# Patient Record
Sex: Male | Born: 1980 | Race: White | Hispanic: No | Smoking: Current every day smoker
Health system: Southern US, Community
[De-identification: ages and names within clinical notes are randomized; demographics above are authoritative.]

## PROBLEM LIST (undated history)

## (undated) DIAGNOSIS — F329 Major depressive disorder, single episode, unspecified: Secondary | ICD-10-CM

## (undated) DIAGNOSIS — F259 Schizoaffective disorder, unspecified: Secondary | ICD-10-CM

## (undated) DIAGNOSIS — F4310 Post-traumatic stress disorder, unspecified: Secondary | ICD-10-CM

## (undated) DIAGNOSIS — F32A Depression, unspecified: Secondary | ICD-10-CM

## (undated) DIAGNOSIS — F649 Gender identity disorder, unspecified: Secondary | ICD-10-CM

## (undated) DIAGNOSIS — K769 Liver disease, unspecified: Secondary | ICD-10-CM

---

## 2007-04-20 ENCOUNTER — Emergency Department: Payer: Self-pay | Admitting: Emergency Medicine

## 2009-11-17 ENCOUNTER — Emergency Department: Payer: Self-pay | Admitting: Emergency Medicine

## 2011-02-20 ENCOUNTER — Emergency Department: Payer: Self-pay | Admitting: Emergency Medicine

## 2011-02-27 ENCOUNTER — Emergency Department: Payer: Self-pay | Admitting: Emergency Medicine

## 2011-04-29 ENCOUNTER — Emergency Department: Payer: Self-pay | Admitting: Emergency Medicine

## 2013-03-12 ENCOUNTER — Emergency Department (HOSPITAL_COMMUNITY)
Admission: EM | Admit: 2013-03-12 | Discharge: 2013-03-12 | Disposition: A | Discharge status: Home / Self Care | Source: Home / Self Care

## 2013-10-01 ENCOUNTER — Encounter (HOSPITAL_COMMUNITY): Payer: Self-pay | Admitting: Emergency Medicine

## 2013-10-01 ENCOUNTER — Emergency Department (HOSPITAL_COMMUNITY)
Admission: EM | Admit: 2013-10-01 | Discharge: 2013-10-02 | Disposition: A | Discharge status: Other Acute Inpatient Hospital | Payer: BC Managed Care – PPO | Attending: Emergency Medicine | Admitting: Emergency Medicine

## 2013-10-01 DIAGNOSIS — F1410 Cocaine abuse, uncomplicated: Secondary | ICD-10-CM | POA: Insufficient documentation

## 2013-10-01 DIAGNOSIS — F1310 Sedative, hypnotic or anxiolytic abuse, uncomplicated: Secondary | ICD-10-CM | POA: Insufficient documentation

## 2013-10-01 DIAGNOSIS — F1910 Other psychoactive substance abuse, uncomplicated: Secondary | ICD-10-CM

## 2013-10-01 DIAGNOSIS — F1210 Cannabis abuse, uncomplicated: Secondary | ICD-10-CM | POA: Insufficient documentation

## 2013-10-01 DIAGNOSIS — IMO0002 Reserved for concepts with insufficient information to code with codable children: Secondary | ICD-10-CM | POA: Insufficient documentation

## 2013-10-01 DIAGNOSIS — Z7982 Long term (current) use of aspirin: Secondary | ICD-10-CM | POA: Insufficient documentation

## 2013-10-01 DIAGNOSIS — F3289 Other specified depressive episodes: Secondary | ICD-10-CM | POA: Insufficient documentation

## 2013-10-01 DIAGNOSIS — F17200 Nicotine dependence, unspecified, uncomplicated: Secondary | ICD-10-CM | POA: Insufficient documentation

## 2013-10-01 DIAGNOSIS — Z880 Allergy status to penicillin: Secondary | ICD-10-CM | POA: Insufficient documentation

## 2013-10-01 DIAGNOSIS — F329 Major depressive disorder, single episode, unspecified: Secondary | ICD-10-CM | POA: Insufficient documentation

## 2013-10-01 DIAGNOSIS — Z79899 Other long term (current) drug therapy: Secondary | ICD-10-CM | POA: Insufficient documentation

## 2013-10-01 DIAGNOSIS — Z792 Long term (current) use of antibiotics: Secondary | ICD-10-CM | POA: Insufficient documentation

## 2013-10-01 DIAGNOSIS — F4310 Post-traumatic stress disorder, unspecified: Secondary | ICD-10-CM | POA: Insufficient documentation

## 2013-10-01 DIAGNOSIS — Z8659 Personal history of other mental and behavioral disorders: Secondary | ICD-10-CM | POA: Insufficient documentation

## 2013-10-01 DIAGNOSIS — Z8719 Personal history of other diseases of the digestive system: Secondary | ICD-10-CM | POA: Insufficient documentation

## 2013-10-01 HISTORY — DX: Post-traumatic stress disorder, unspecified: F43.10

## 2013-10-01 HISTORY — DX: Schizoaffective disorder, unspecified: F25.9

## 2013-10-01 HISTORY — DX: Depression, unspecified: F32.A

## 2013-10-01 HISTORY — DX: Liver disease, unspecified: K76.9

## 2013-10-01 HISTORY — DX: Major depressive disorder, single episode, unspecified: F32.9

## 2013-10-01 LAB — CBC WITH DIFFERENTIAL/PLATELET
BASOS ABS: 0 10*3/uL (ref 0.0–0.1)
Basophils Relative: 0 % (ref 0–1)
EOS PCT: 0 % (ref 0–5)
Eosinophils Absolute: 0 10*3/uL (ref 0.0–0.7)
HCT: 35.9 % — ABNORMAL LOW (ref 39.0–52.0)
Hemoglobin: 12.5 g/dL — ABNORMAL LOW (ref 13.0–17.0)
LYMPHS PCT: 26 % (ref 12–46)
Lymphs Abs: 2 10*3/uL (ref 0.7–4.0)
MCH: 31.2 pg (ref 26.0–34.0)
MCHC: 34.8 g/dL (ref 30.0–36.0)
MCV: 89.5 fL (ref 78.0–100.0)
Monocytes Absolute: 0.4 10*3/uL (ref 0.1–1.0)
Monocytes Relative: 5 % (ref 3–12)
Neutro Abs: 5 10*3/uL (ref 1.7–7.7)
Neutrophils Relative %: 69 % (ref 43–77)
Platelets: 128 10*3/uL — ABNORMAL LOW (ref 150–400)
RBC: 4.01 MIL/uL — ABNORMAL LOW (ref 4.22–5.81)
RDW: 13.3 % (ref 11.5–15.5)
WBC: 7.4 10*3/uL (ref 4.0–10.5)

## 2013-10-01 LAB — COMPREHENSIVE METABOLIC PANEL
ALT: 21 U/L (ref 0–53)
AST: 20 U/L (ref 0–37)
Albumin: 3.8 g/dL (ref 3.5–5.2)
Alkaline Phosphatase: 58 U/L (ref 39–117)
Anion gap: 15 (ref 5–15)
BUN: 17 mg/dL (ref 6–23)
CALCIUM: 9.3 mg/dL (ref 8.4–10.5)
CO2: 24 meq/L (ref 19–32)
Chloride: 101 mEq/L (ref 96–112)
Creatinine, Ser: 0.97 mg/dL (ref 0.50–1.35)
GFR calc Af Amer: >90 mL/min (ref >90)
Glucose, Bld: 118 mg/dL — ABNORMAL HIGH (ref 70–99)
Potassium: 4.2 mEq/L (ref 3.7–5.3)
SODIUM: 140 meq/L (ref 137–147)
TOTAL PROTEIN: 6.5 g/dL (ref 6.0–8.3)
Total Bilirubin: 0.3 mg/dL (ref 0.3–1.2)

## 2013-10-01 LAB — ETHANOL: Alcohol, Ethyl (B): <11 mg/dL (ref 0–11)

## 2013-10-01 LAB — ACETAMINOPHEN LEVEL: Acetaminophen (Tylenol), Serum: <15 ug/mL (ref 10–30)

## 2013-10-01 LAB — SALICYLATE LEVEL: Salicylate Lvl: <2 mg/dL — ABNORMAL LOW (ref 2.8–20.0)

## 2013-10-01 MED ORDER — LORAZEPAM 2 MG/ML IJ SOLN: 0.0000 mg | Freq: Two times a day (BID) | INTRAMUSCULAR | Status: DC

## 2013-10-01 MED ORDER — VITAMIN B-1 100 MG PO TABS
100.0000 mg | ORAL_TABLET | Freq: Every day | ORAL | Status: DC
  Administered 2013-10-02: 100 mg via ORAL
  Filled 2013-10-01: qty 1

## 2013-10-01 MED ORDER — THIAMINE HCL 100 MG/ML IJ SOLN: 100.0000 mg | Freq: Every day | INTRAMUSCULAR | Status: DC

## 2013-10-01 MED ORDER — LORAZEPAM 2 MG/ML IJ SOLN: 0.0000 mg | Freq: Four times a day (QID) | INTRAMUSCULAR | Status: DC

## 2013-10-01 MED ORDER — LORAZEPAM 1 MG PO TABS: 0.0000 mg | ORAL_TABLET | Freq: Two times a day (BID) | ORAL | Status: DC

## 2013-10-01 MED ORDER — LORAZEPAM 1 MG PO TABS: 0.0000 mg | ORAL_TABLET | Freq: Four times a day (QID) | ORAL | Status: DC

## 2013-10-01 NOTE — BH Assessment (Signed)
Received call for assessment. Spoke with Imagene ShellerSteve Walton, MD who said Pt is requesting substance abuse treatment. Tele-assessment will be initiated.  Harlin RainFord Ellis Ria CommentWarrick Jr, LPC, [facility] Triage Specialist [phone]

## 2013-10-01 NOTE — ED Notes (Signed)
The pt was awakened  To move to  Pod c rm 25.  Pt co-operative.  Personal belongings given to [facility] rn.  Also number given to him  For transfer to [place] tomorrow   Call needs to be made tonight to insure a bed for tomorrow

## 2013-10-01 NOTE — ED Provider Notes (Signed)
CSN: [id number]     Arrival date & time 10/01/13  2045 History   First MD Initiated Contact with Patient 10/01/13 2218     Chief Complaint  Patient presents with  . Addiction Problem  . Medical Clearance     (Consider location/radiation/quality/duration/timing/severity/associated sxs/prior Treatment) HPI 33 year old male whose transgendered prefers identifies a male. She states that she's been taking multiple doses substances including benzos, amphetamines, opiates, crack cocaine. Patient has taken approximately 10 mg of alprazolam as well as crack cocaine. He states he is here for detoxification and her desire to get into an inpatient rehabilitation program. Patient denies any chest pain abdominal pain. Denies any recent nausea, vomiting, fevers, diarrhea.  Past Medical History  Diagnosis Date  . Schizoaffective disorder   . PTSD (post-traumatic stress disorder)   . Depression   . Liver disorder    History reviewed. No pertinent past surgical history. No family history on file. History  Substance Use Topics  . Smoking status: Current Every Day Smoker -- 1.00 packs/day    Types: Cigarettes  . Smokeless tobacco: Not on file  . Alcohol Use: Yes     Comment: occasional    Review of Systems  Constitutional: Negative for fever and chills.  HENT: Negative for sore throat.   Eyes: Negative for pain.  Respiratory: Negative for cough and shortness of breath.   Cardiovascular: Negative for chest pain.  Gastrointestinal: Negative for nausea, vomiting and abdominal pain.  Genitourinary: Negative for dysuria and flank pain.  Musculoskeletal: Negative for back pain and neck pain.  Skin: Negative for rash.  Neurological: Negative for seizures and headaches.      Allergies  Penicillins and Sulfa antibiotics  Home Medications   Prior to Admission medications   Medication Sig Start Date End Date Taking? Authorizing Provider  ARIPiprazole (ABILIFY) 20 MG tablet Take 10 mg by  mouth daily.   Yes Historical Provider, MD  aspirin EC 81 MG tablet Take 81 mg by mouth daily.   Yes Historical Provider, MD  atorvastatin (LIPITOR) 40 MG tablet Take 20 mg by mouth daily.   Yes Historical Provider, MD  doxycycline (MONODOX) 100 MG capsule Take 200 mg by mouth daily.   Yes Historical Provider, MD  emtricitabine-tenofovir (TRUVADA) 200-300 MG per tablet Take 1 tablet by mouth daily.   Yes Historical Provider, MD  estradiol (ALORA) 0.05 MG/24HR patch Place 1 patch onto the skin daily.   Yes Historical Provider, MD  finasteride (PROSCAR) 5 MG tablet Take 10 mg by mouth daily.   Yes Historical Provider, MD  fluticasone (FLONASE) 50 MCG/ACT nasal spray Place 1 spray into both nostrils daily.   Yes Historical Provider, MD  hydrocortisone 2.5 % cream Apply 1 application topically daily.   Yes Historical Provider, MD  MINOXIDIL, TOPICAL, (MINOXIDIL FOR MEN) 5 % SOLN Apply 1 application topically 2 (two) times daily.   Yes Historical Provider, MD  Omega-3 Fatty Acids (FISH OIL) 1000 MG CAPS Take 3,000 mg by mouth daily.   Yes Historical Provider, MD  pantoprazole (PROTONIX) 40 MG tablet Take 40 mg by mouth daily.   Yes Historical Provider, MD  PARoxetine (PAXIL) 40 MG tablet Take 20 mg by mouth daily.   Yes Historical Provider, MD  Psyllium Husk POWD Take 15 mLs by mouth 2 (two) times daily. Mix in juice or water and drink   Yes Historical Provider, MD  senna-docusate (SENNA S) 8.6-50 MG per tablet Take 1 tablet by mouth daily.   Yes Historical Provider, MD  BP 98/68  Pulse 79  Temp(Src) 98.3 F (36.8 C) (Oral)  Resp 14  Ht 5\' 11"  (1.803 m)  Wt 185 lb (83.915 kg)  BMI 25.81 kg/m2  SpO2 97% Physical Exam  Constitutional: He is oriented to person, place, and time. He appears well-developed and well-nourished. No distress.  HENT:  Head: Normocephalic and atraumatic.  Eyes: Pupils are equal, round, and reactive to light.  Neck: Normal range of motion.  Cardiovascular: Normal rate  and regular rhythm.   Pulmonary/Chest: Effort normal and breath sounds normal.  Abdominal: Soft. He exhibits no distension. There is no tenderness.  Musculoskeletal: Normal range of motion.  Neurological: He is alert and oriented to person, place, and time.  Skin: Skin is warm. He is not diaphoretic.    ED Course  Procedures (including critical care time) Labs Review Labs Reviewed  CBC WITH DIFFERENTIAL - Abnormal; Notable for the following:    RBC 4.01 (*)    Hemoglobin 12.5 (*)    HCT 35.9 (*)    Platelets 128 (*)    All other components within normal limits  COMPREHENSIVE METABOLIC PANEL - Abnormal; Notable for the following:    Glucose, Bld 118 (*)    All other components within normal limits  SALICYLATE LEVEL - Abnormal; Notable for the following:    Salicylate Lvl <2.0 (*)    All other components within normal limits  ETHANOL  ACETAMINOPHEN LEVEL  URINE RAPID DRUG SCREEN (HOSP PERFORMED)    Imaging Review No results found.   EKG Interpretation None      MDM   Final diagnoses:  Drug intoxication, uncomplicated   33 year old male who prefers identifies the male presents today for detoxification.  Patient hemodynamically stable upon arrival. Patient is not tachycardic nor appears diaphoretic. Patient states he is mildly agitated. Patient appears medically stable at this time. Patient is here for detoxification. Plan is to obtain basic labs 30 medically clear the patient.  Consult with TTS, [place] into place the patient in a detoxification center. His vital happen until morning. Patient otherwise seems hemodynamically stable this time. Will place on Xeloda protocol. Anticipate admission to a detox facility or dual diagnosis facility in the morning. Patient seen and evaluated by myself and by the attending Dr. Norlene Campbell.   12:40 AM Behavior health planning admission for detox facility. Planning for [facility] vs [facility] vs [facility] vs [facility]. Plan to have spot by morning.   Imagene Sheller, MD 10/02/13 0030  Imagene Sheller, MD 10/02/13 0040

## 2013-10-01 NOTE — ED Notes (Signed)
Patient here expressing need for detox. Patient is trans gender, prefers to identify as male. States that she has been taking multiple types of substances including benzodiazepines, amphetamines, and opiates. Has been taking 8-10mg /day of alprazolam. Patient states that she has had 4mg  of alprazolam today.

## 2013-10-02 ENCOUNTER — Inpatient Hospital Stay (HOSPITAL_COMMUNITY)
Admission: EM | Admit: 2013-10-02 | Discharge: 2013-10-09 | DRG: 897 | Disposition: A | Discharge status: Home / Self Care | Payer: BC Managed Care – PPO | Source: Intra-hospital | Attending: Psychiatry | Admitting: Psychiatry

## 2013-10-02 ENCOUNTER — Encounter (HOSPITAL_COMMUNITY): Payer: Self-pay | Admitting: *Deleted

## 2013-10-02 ENCOUNTER — Encounter (HOSPITAL_COMMUNITY): Payer: Self-pay | Admitting: Emergency Medicine

## 2013-10-02 DIAGNOSIS — F1994 Other psychoactive substance use, unspecified with psychoactive substance-induced mood disorder: Secondary | ICD-10-CM

## 2013-10-02 DIAGNOSIS — F4310 Post-traumatic stress disorder, unspecified: Secondary | ICD-10-CM

## 2013-10-02 DIAGNOSIS — Z79899 Other long term (current) drug therapy: Secondary | ICD-10-CM

## 2013-10-02 DIAGNOSIS — G4700 Insomnia, unspecified: Secondary | ICD-10-CM | POA: Diagnosis present

## 2013-10-02 DIAGNOSIS — F321 Major depressive disorder, single episode, moderate: Secondary | ICD-10-CM | POA: Diagnosis present

## 2013-10-02 DIAGNOSIS — F1520 Other stimulant dependence, uncomplicated: Secondary | ICD-10-CM | POA: Diagnosis present

## 2013-10-02 DIAGNOSIS — F1110 Opioid abuse, uncomplicated: Secondary | ICD-10-CM | POA: Diagnosis present

## 2013-10-02 DIAGNOSIS — F1320 Sedative, hypnotic or anxiolytic dependence, uncomplicated: Secondary | ICD-10-CM

## 2013-10-02 DIAGNOSIS — F1920 Other psychoactive substance dependence, uncomplicated: Secondary | ICD-10-CM

## 2013-10-02 DIAGNOSIS — K5900 Constipation, unspecified: Secondary | ICD-10-CM | POA: Diagnosis present

## 2013-10-02 DIAGNOSIS — K219 Gastro-esophageal reflux disease without esophagitis: Secondary | ICD-10-CM | POA: Diagnosis present

## 2013-10-02 DIAGNOSIS — F259 Schizoaffective disorder, unspecified: Secondary | ICD-10-CM | POA: Diagnosis present

## 2013-10-02 DIAGNOSIS — F17200 Nicotine dependence, unspecified, uncomplicated: Secondary | ICD-10-CM | POA: Diagnosis present

## 2013-10-02 DIAGNOSIS — F410 Panic disorder [episodic paroxysmal anxiety] without agoraphobia: Secondary | ICD-10-CM | POA: Diagnosis present

## 2013-10-02 DIAGNOSIS — Z7982 Long term (current) use of aspirin: Secondary | ICD-10-CM

## 2013-10-02 DIAGNOSIS — F411 Generalized anxiety disorder: Secondary | ICD-10-CM | POA: Diagnosis present

## 2013-10-02 DIAGNOSIS — F1420 Cocaine dependence, uncomplicated: Principal | ICD-10-CM | POA: Diagnosis present

## 2013-10-02 DIAGNOSIS — F1114 Opioid abuse with opioid-induced mood disorder: Secondary | ICD-10-CM

## 2013-10-02 LAB — RAPID URINE DRUG SCREEN, HOSP PERFORMED
Amphetamines: NOT DETECTED
BARBITURATES: NOT DETECTED
BENZODIAZEPINES: POSITIVE — AB
COCAINE: POSITIVE — AB
Opiates: NOT DETECTED
Tetrahydrocannabinol: POSITIVE — AB

## 2013-10-02 MED ORDER — CHLORDIAZEPOXIDE HCL 25 MG PO CAPS
25.0000 mg | ORAL_CAPSULE | ORAL | Status: AC
  Administered 2013-10-05 – 2013-10-06 (×2): 25 mg via ORAL
  Filled 2013-10-02 (×2): qty 1

## 2013-10-02 MED ORDER — PANTOPRAZOLE SODIUM 40 MG PO TBEC
40.0000 mg | DELAYED_RELEASE_TABLET | Freq: Every day | ORAL | Status: DC
  Administered 2013-10-02: 40 mg via ORAL
  Filled 2013-10-02: qty 1

## 2013-10-02 MED ORDER — CHLORDIAZEPOXIDE HCL 25 MG PO CAPS
25.0000 mg | ORAL_CAPSULE | Freq: Every day | ORAL | Status: AC
  Administered 2013-10-07: 25 mg via ORAL
  Filled 2013-10-02: qty 1

## 2013-10-02 MED ORDER — FLUTICASONE PROPIONATE 50 MCG/ACT NA SUSP
1.0000 | Freq: Every day | NASAL | Status: DC
  Administered 2013-10-02: 1 via NASAL
  Filled 2013-10-02: qty 16

## 2013-10-02 MED ORDER — ESTRADIOL 0.05 MG/24HR TD PTWK
0.0500 mg | MEDICATED_PATCH | TRANSDERMAL | Status: DC
  Filled 2013-10-02: qty 1

## 2013-10-02 MED ORDER — DOXYCYCLINE HYCLATE 100 MG PO TABS
200.0000 mg | ORAL_TABLET | Freq: Every day | ORAL | Status: DC
  Administered 2013-10-02: 200 mg via ORAL
  Filled 2013-10-02: qty 2

## 2013-10-02 MED ORDER — LORAZEPAM 1 MG PO TABS
0.0000 mg | ORAL_TABLET | Freq: Four times a day (QID) | ORAL | Status: DC
  Administered 2013-10-02: 1 mg via ORAL
  Filled 2013-10-02: qty 1

## 2013-10-02 MED ORDER — SENNOSIDES-DOCUSATE SODIUM 8.6-50 MG PO TABS
1.0000 | ORAL_TABLET | Freq: Every evening | ORAL | Status: DC | PRN
  Filled 2013-10-02: qty 1

## 2013-10-02 MED ORDER — CHLORDIAZEPOXIDE HCL 25 MG PO CAPS
25.0000 mg | ORAL_CAPSULE | Freq: Once | ORAL | Status: AC
  Administered 2013-10-02: 25 mg via ORAL
  Filled 2013-10-02: qty 1

## 2013-10-02 MED ORDER — LORAZEPAM 2 MG/ML IJ SOLN: 0.0000 mg | Freq: Two times a day (BID) | INTRAMUSCULAR | Status: DC

## 2013-10-02 MED ORDER — ADULT MULTIVITAMIN W/MINERALS CH
1.0000 | ORAL_TABLET | Freq: Every day | ORAL | Status: DC
  Administered 2013-10-02: 1 via ORAL
  Filled 2013-10-02: qty 1

## 2013-10-02 MED ORDER — EMTRICITABINE-TENOFOVIR DF 200-300 MG PO TABS
1.0000 | ORAL_TABLET | Freq: Every day | ORAL | Status: DC
  Administered 2013-10-02: 1 via ORAL
  Filled 2013-10-02: qty 1

## 2013-10-02 MED ORDER — ASPIRIN 81 MG PO CHEW
81.0000 mg | CHEWABLE_TABLET | Freq: Every day | ORAL | Status: DC
  Administered 2013-10-02: 81 mg via ORAL
  Filled 2013-10-02: qty 1

## 2013-10-02 MED ORDER — ARIPIPRAZOLE 10 MG PO TABS
20.0000 mg | ORAL_TABLET | Freq: Every day | ORAL | Status: DC
  Administered 2013-10-02: 20 mg via ORAL
  Filled 2013-10-02: qty 2

## 2013-10-02 MED ORDER — VITAMIN B-1 100 MG PO TABS
100.0000 mg | ORAL_TABLET | Freq: Every day | ORAL | Status: DC
  Administered 2013-10-03 – 2013-10-09 (×7): 100 mg via ORAL
  Filled 2013-10-02 (×10): qty 1

## 2013-10-02 MED ORDER — ATORVASTATIN CALCIUM 40 MG PO TABS
40.0000 mg | ORAL_TABLET | Freq: Every day | ORAL | Status: DC
  Filled 2013-10-02: qty 1

## 2013-10-02 MED ORDER — NICOTINE 21 MG/24HR TD PT24
21.0000 mg | MEDICATED_PATCH | Freq: Every day | TRANSDERMAL | Status: DC
  Administered 2013-10-03 – 2013-10-04 (×2): 21 mg via TRANSDERMAL
  Filled 2013-10-02 (×4): qty 1

## 2013-10-02 MED ORDER — MAGNESIUM HYDROXIDE 400 MG/5ML PO SUSP
30.0000 mL | Freq: Every day | ORAL | Status: DC | PRN
Start: 2013-10-02 — End: 2013-10-09

## 2013-10-02 MED ORDER — PNEUMOCOCCAL VAC POLYVALENT 25 MCG/0.5ML IJ INJ
0.5000 mL | INJECTION | INTRAMUSCULAR | Status: AC
  Administered 2013-10-03: 0.5 mL via INTRAMUSCULAR

## 2013-10-02 MED ORDER — OMEGA-3-ACID ETHYL ESTERS 1 G PO CAPS
1.0000 g | ORAL_CAPSULE | Freq: Every day | ORAL | Status: DC
  Administered 2013-10-02: 1 g via ORAL
  Filled 2013-10-02: qty 1

## 2013-10-02 MED ORDER — LORAZEPAM 1 MG PO TABS: 0.0000 mg | ORAL_TABLET | Freq: Two times a day (BID) | ORAL | Status: DC

## 2013-10-02 MED ORDER — NICOTINE 21 MG/24HR TD PT24
21.0000 mg | MEDICATED_PATCH | Freq: Once | TRANSDERMAL | Status: DC
  Administered 2013-10-02: 21 mg via TRANSDERMAL
  Filled 2013-10-02: qty 1

## 2013-10-02 MED ORDER — EMTRICITABINE-TENOFOVIR DF 200-300 MG PO TABS: 1.0000 | ORAL_TABLET | Freq: Every day | ORAL | Status: DC

## 2013-10-02 MED ORDER — ESTRADIOL 0.05 MG/24HR TD PTWK: 0.0500 mg | MEDICATED_PATCH | TRANSDERMAL | Status: DC

## 2013-10-02 MED ORDER — CHLORDIAZEPOXIDE HCL 25 MG PO CAPS
25.0000 mg | ORAL_CAPSULE | Freq: Four times a day (QID) | ORAL | Status: AC
  Administered 2013-10-02 – 2013-10-04 (×6): 25 mg via ORAL
  Filled 2013-10-02 (×6): qty 1

## 2013-10-02 MED ORDER — THIAMINE HCL 100 MG/ML IJ SOLN: 100.0000 mg | Freq: Once | INTRAMUSCULAR | Status: DC

## 2013-10-02 MED ORDER — ACETAMINOPHEN 325 MG PO TABS
650.0000 mg | ORAL_TABLET | Freq: Once | ORAL | Status: AC
  Administered 2013-10-02: 650 mg via ORAL
  Filled 2013-10-02: qty 2

## 2013-10-02 MED ORDER — ONDANSETRON 4 MG PO TBDP: 4.0000 mg | ORAL_TABLET | Freq: Four times a day (QID) | ORAL | Status: AC | PRN

## 2013-10-02 MED ORDER — CHLORDIAZEPOXIDE HCL 25 MG PO CAPS
25.0000 mg | ORAL_CAPSULE | Freq: Three times a day (TID) | ORAL | Status: AC
  Administered 2013-10-04 – 2013-10-05 (×3): 25 mg via ORAL
  Filled 2013-10-02 (×3): qty 1

## 2013-10-02 MED ORDER — ADULT MULTIVITAMIN W/MINERALS CH
1.0000 | ORAL_TABLET | Freq: Every day | ORAL | Status: DC
  Administered 2013-10-03 – 2013-10-09 (×7): 1 via ORAL
  Filled 2013-10-02 (×9): qty 1

## 2013-10-02 MED ORDER — LOPERAMIDE HCL 2 MG PO CAPS: 2.0000 mg | ORAL_CAPSULE | ORAL | Status: AC | PRN

## 2013-10-02 MED ORDER — FINASTERIDE 5 MG PO TABS
5.0000 mg | ORAL_TABLET | Freq: Every day | ORAL | Status: DC
  Administered 2013-10-02: 5 mg via ORAL
  Filled 2013-10-02: qty 1

## 2013-10-02 MED ORDER — ACETAMINOPHEN 325 MG PO TABS
650.0000 mg | ORAL_TABLET | Freq: Four times a day (QID) | ORAL | Status: DC | PRN
  Administered 2013-10-03: 650 mg via ORAL
  Filled 2013-10-02: qty 2

## 2013-10-02 MED ORDER — CHLORDIAZEPOXIDE HCL 25 MG PO CAPS: 25.0000 mg | ORAL_CAPSULE | Freq: Four times a day (QID) | ORAL | Status: AC | PRN

## 2013-10-02 MED ORDER — FOLIC ACID 1 MG PO TABS
1.0000 mg | ORAL_TABLET | Freq: Every day | ORAL | Status: DC
  Administered 2013-10-02: 1 mg via ORAL
  Filled 2013-10-02: qty 1

## 2013-10-02 MED ORDER — PAROXETINE HCL 20 MG PO TABS
20.0000 mg | ORAL_TABLET | Freq: Every day | ORAL | Status: DC
  Administered 2013-10-02: 20 mg via ORAL
  Filled 2013-10-02: qty 1

## 2013-10-02 MED ORDER — HYDROXYZINE HCL 25 MG PO TABS
25.0000 mg | ORAL_TABLET | Freq: Four times a day (QID) | ORAL | Status: AC | PRN
Start: 2013-10-02 — End: 2013-10-05
  Administered 2013-10-02 – 2013-10-04 (×2): 25 mg via ORAL
  Filled 2013-10-02 (×2): qty 1

## 2013-10-02 MED ORDER — ESTRADIOL 0.05 MG/24HR TD PTWK
0.0500 mg | MEDICATED_PATCH | TRANSDERMAL | Status: DC
  Administered 2013-10-02: 0.05 mg via TRANSDERMAL
  Filled 2013-10-02: qty 1

## 2013-10-02 MED ORDER — ALUM & MAG HYDROXIDE-SIMETH 200-200-20 MG/5ML PO SUSP: 30.0000 mL | ORAL | Status: DC | PRN

## 2013-10-02 NOTE — Progress Notes (Signed)
MHT initiated detox placement at the following facilities with bed availability:  1)[facility] 2)[facility] 3)[facility] 4)[facility]  Blain PaisMichelle L Haley Fuerstenberg, MHT/NS

## 2013-10-02 NOTE — ED Notes (Signed)
[facility] transport contacted  

## 2013-10-02 NOTE — ED Notes (Addendum)
PATIENT REPORTS SHE HAS USED [facility] IN THE PAST. STATES IT WAS 5 YEARS AGO. SHE STATES THAT FOR THE PAST 2.5 WEEKS SHE HAS BEEN ON A BINGE AND USING CRACK ABOUT EVERY OTHER DAY (20-30$) AMD USING OXYCONTIN 7.5 MG TABS 4-5 PILLS MAYBE TWICE WEEKLY. ALSO STATES SHE TRIED MOLLY 1 TIME A FEW DAYS AGO. STATES SHE HAS BEEN USING 3-4 "BARS" OF XANAX (8-10 MG) DAILY AND STATES HE WILL USUALLY SNORT 1 BAR. STATES SHE DOESN'T DRINK ALCOHOL. STATES SHE HAS MAYBE 2-3 GLASSES OF WINE A WEEK BUT IT CAUSES HER HEARTBURN. CURRENTLY SHE STATES SHE IS HAVING A LOT OF CRAVINGS TO GO USE. SHE DOES NOT IDENTIFY WHAT SHE IS CRAVING. STATES JUST TO "DO SOMETHING"  SHE IDENTIFIES AS MALE. STATES SHE HAS BEEN TAKING HORMONES FOR ABOUT 1 YEAR NOW. SHE HAS NOT HAD ANY SURGERIES TO THIS POINT.

## 2013-10-02 NOTE — BH Assessment (Signed)
Assessment complete. Clint [facility]olderori Beck[facility], confirms adult unit is at capacity. Gave clinical report to Alberteen SamFran Hobson, NP who says Pt meets criteria for inpatient detox treatment. TTS will contact other facilities for placement. Notified Dr. Yvonna AlanisScott Walton of recommendation.  Harlin RainFord Ellis Ria CommentWarrick Jr, LPC, [facility] Triage Specialist [phone]

## 2013-10-02 NOTE — Tx Team (Signed)
Initial Interdisciplinary Treatment Plan  PATIENT STRENGTHS: (choose at least two) Ability for insight Average or above average intelligence Capable of independent living Motivation for treatment/growth  PATIENT STRESSORS: Substance abuse   PROBLEM LIST: Problem List/Patient Goals Date to be addressed Date deferred Reason deferred Estimated date of resolution  Polysubstance dependence 10/02/13                                                      DISCHARGE CRITERIA:  Ability to meet basic life and health needs Improved stabilization in mood, thinking, and/or behavior Verbal commitment to aftercare and medication compliance Withdrawal symptoms are absent or subacute and managed without 24-hour nursing intervention  PRELIMINARY DISCHARGE PLAN: Attend aftercare/continuing care group  PATIENT/FAMIILY INVOLVEMENT: This treatment plan has been presented to and reviewed with the patient, Joshua GrimesJames B Engen, and/or family member, .  The patient and family have been given the opportunity to ask questions and make suggestions.  [name], [place] 10/02/2013, 4:14 PM

## 2013-10-02 NOTE — BHH Counselor (Addendum)
[facility] pt hasn't been reviewed yet. They don't have any beds. Writer left two voicemails for [facility] at 10:45 and 11:45 am  Evette Cristalaroline Paige Ashya Nicolaisen, [place] Assessment Counselor

## 2013-10-02 NOTE — ED Provider Notes (Signed)
  I saw and evaluated the patient, reviewed the resident's note and I agree with the findings and plan.   EKG Interpretation None       Olivia Mackielga M Tiearra Colwell, MD 10/02/13 1615

## 2013-10-02 NOTE — ED Notes (Signed)
CALLED NICHOLE AT [facility]. SHE ADVISES THAT PT IS DECLINED DUE TO NEEDING A HIGHER LEVEL OF CARE. BH HAS BEEN MADE AWARE

## 2013-10-02 NOTE — ED Notes (Signed)
Was discuss that patient needs to go through a detox than possilby may be transfer [facility] [place] Valentina GuJen [place]  When this becomes possible tele phone [phone] Ex [phone]

## 2013-10-02 NOTE — ED Notes (Signed)
TINA FROM BH HAS CALLED AND REQUESTED ADMIT CONSENT BE SIGNED. SHE IS IN PROCESS OF ASSIGNING PT A ROOM

## 2013-10-02 NOTE — ED Notes (Signed)
SPOKE TO MELISSA AT [facility]. SHE ADVISES THEY DO NOT HAVE ANY [place] [facility] BEDS AVAILABLE TODAY

## 2013-10-02 NOTE — Progress Notes (Signed)
D: Pt presents flat and depressed this evening. Pt in bed during the start of shift. Pt reporting chills, sweating, hot flashes, and anxiety in relation to his withdrawal symptoms. Pt is currently endorsing anxiety and depression. Pt is currently denying any suicidal ideation. Pt is negative for any HI/AVH.  A: Writer administered scheduled and prn medications to pt. Continued support and availability as needed was extended to this pt. Staff continue to monitor pt with q315min checks.  R: No adverse drug reactions noted. Pt receptive to treatment. Pt remains safe at this time.

## 2013-10-02 NOTE — ED Notes (Signed)
CALLED PHARMACY AND REQUESTED PT MEDICATIONS

## 2013-10-02 NOTE — Progress Notes (Signed)
33 year old male pt admitted on voluntary basis. Pt reports that he came in to the hospital last night seeking detox from polysubstance abuse. Pt denies any SI and is able to contract for safety on the unit. Pt reports that he has been [facility] the past [facility] for substance abuse issues. Pt reported that he would like to have some type of long term treatment when he discharges from here. Pt reports being transgender and going through hormone therapy and prefers to go by the name "Joshua Davila". Pt was oriented to the unit and safety maintained.

## 2013-10-02 NOTE — Discharge Instructions (Signed)
Transfer to [facility] 
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
## 2013-10-02 NOTE — BH Assessment (Signed)
Confirmed with Carla at [facility] that pt's information has been received and is currently under review for inpatient treatment bed placement.   Dolce Sylvia, MS, LCASA Assessment Counselor  

## 2013-10-03 ENCOUNTER — Encounter (HOSPITAL_COMMUNITY): Payer: Self-pay | Admitting: Psychiatry

## 2013-10-03 DIAGNOSIS — F4310 Post-traumatic stress disorder, unspecified: Secondary | ICD-10-CM | POA: Diagnosis present

## 2013-10-03 DIAGNOSIS — F1994 Other psychoactive substance use, unspecified with psychoactive substance-induced mood disorder: Secondary | ICD-10-CM

## 2013-10-03 MED ORDER — SENNOSIDES-DOCUSATE SODIUM 8.6-50 MG PO TABS
1.0000 | ORAL_TABLET | Freq: Every day | ORAL | Status: DC
  Administered 2013-10-03 – 2013-10-09 (×7): 1 via ORAL
  Filled 2013-10-03 (×9): qty 1

## 2013-10-03 MED ORDER — DOXYCYCLINE HYCLATE 100 MG PO TABS
100.0000 mg | ORAL_TABLET | Freq: Two times a day (BID) | ORAL | Status: DC
  Administered 2013-10-03 – 2013-10-09 (×13): 100 mg via ORAL
  Filled 2013-10-03 (×19): qty 1

## 2013-10-03 MED ORDER — PANTOPRAZOLE SODIUM 40 MG PO TBEC
40.0000 mg | DELAYED_RELEASE_TABLET | Freq: Every day | ORAL | Status: DC
  Administered 2013-10-03 – 2013-10-09 (×7): 40 mg via ORAL
  Filled 2013-10-03 (×11): qty 1

## 2013-10-03 MED ORDER — OMEGA-3-ACID ETHYL ESTERS 1 G PO CAPS
3.0000 g | ORAL_CAPSULE | Freq: Every day | ORAL | Status: DC
  Filled 2013-10-03: qty 3

## 2013-10-03 MED ORDER — FISH OIL 1000 MG PO CAPS: 3000.0000 mg | ORAL_CAPSULE | Freq: Every day | ORAL | Status: DC

## 2013-10-03 MED ORDER — OMEGA-3-ACID ETHYL ESTERS 1 G PO CAPS
2.0000 g | ORAL_CAPSULE | Freq: Every day | ORAL | Status: DC
  Administered 2013-10-03 – 2013-10-09 (×7): 2 g via ORAL
  Filled 2013-10-03 (×9): qty 2

## 2013-10-03 MED ORDER — FLUTICASONE PROPIONATE 50 MCG/ACT NA SUSP
1.0000 | Freq: Every day | NASAL | Status: DC
  Administered 2013-10-03 – 2013-10-09 (×7): 1 via NASAL
  Filled 2013-10-03 (×2): qty 16

## 2013-10-03 MED ORDER — MINOXIDIL 5 % EX SOLN
1.0000 "application " | Freq: Two times a day (BID) | CUTANEOUS | Status: DC
  Administered 2013-10-03: 22:00:00 via TOPICAL
  Administered 2013-10-04: 1 via TOPICAL
  Administered 2013-10-04: 22:00:00 via TOPICAL
  Administered 2013-10-05 – 2013-10-06 (×3): 1 via TOPICAL
  Administered 2013-10-06: 08:00:00 via TOPICAL
  Administered 2013-10-07 – 2013-10-09 (×4): 1 via TOPICAL

## 2013-10-03 MED ORDER — PSYLLIUM HUSK POWD: 15.0000 mL | Freq: Two times a day (BID) | Status: DC

## 2013-10-03 MED ORDER — EMTRICITABINE-TENOFOVIR DF 200-300 MG PO TABS
1.0000 | ORAL_TABLET | Freq: Every day | ORAL | Status: DC
  Administered 2013-10-03 – 2013-10-09 (×7): 1 via ORAL
  Filled 2013-10-03 (×10): qty 1

## 2013-10-03 MED ORDER — PSYLLIUM 95 % PO PACK
1.0000 | PACK | Freq: Two times a day (BID) | ORAL | Status: DC
  Administered 2013-10-03 – 2013-10-09 (×9): 1 via ORAL
  Filled 2013-10-03 (×17): qty 1

## 2013-10-03 MED ORDER — ATORVASTATIN CALCIUM 20 MG PO TABS
20.0000 mg | ORAL_TABLET | Freq: Every day | ORAL | Status: DC
  Administered 2013-10-03 – 2013-10-09 (×7): 20 mg via ORAL
  Filled 2013-10-03 (×9): qty 1

## 2013-10-03 MED ORDER — ESTRADIOL 0.05 MG/24HR TD PTWK
0.0500 mg | MEDICATED_PATCH | TRANSDERMAL | Status: DC
  Administered 2013-10-07: 0.05 mg via TRANSDERMAL
  Filled 2013-10-03: qty 1

## 2013-10-03 MED ORDER — ASPIRIN EC 81 MG PO TBEC
81.0000 mg | DELAYED_RELEASE_TABLET | Freq: Every day | ORAL | Status: DC
  Administered 2013-10-03 – 2013-10-09 (×7): 81 mg via ORAL
  Filled 2013-10-03 (×9): qty 1

## 2013-10-03 MED ORDER — ESTRADIOL 0.05 MG/24HR TD PTTW: 0.0500 mg | MEDICATED_PATCH | Freq: Every day | TRANSDERMAL | Status: DC

## 2013-10-03 MED ORDER — FINASTERIDE 5 MG PO TABS
10.0000 mg | ORAL_TABLET | Freq: Every day | ORAL | Status: DC
  Administered 2013-10-03 – 2013-10-09 (×7): 10 mg via ORAL
  Filled 2013-10-03 (×9): qty 2

## 2013-10-03 MED ORDER — ARIPIPRAZOLE 10 MG PO TABS
10.0000 mg | ORAL_TABLET | Freq: Every day | ORAL | Status: DC
  Administered 2013-10-03 – 2013-10-09 (×7): 10 mg via ORAL
  Filled 2013-10-03 (×9): qty 1

## 2013-10-03 MED ORDER — PAROXETINE HCL 20 MG PO TABS
20.0000 mg | ORAL_TABLET | Freq: Every day | ORAL | Status: DC
  Administered 2013-10-03 – 2013-10-09 (×7): 20 mg via ORAL
  Filled 2013-10-03 (×9): qty 1

## 2013-10-03 MED ORDER — HYDROCORTISONE 2.5 % EX CREA
1.0000 "application " | TOPICAL_CREAM | Freq: Every day | CUTANEOUS | Status: DC
  Administered 2013-10-04: 1 via TOPICAL
  Filled 2013-10-03: qty 30

## 2013-10-03 MED ORDER — GABAPENTIN 300 MG PO CAPS
300.0000 mg | ORAL_CAPSULE | Freq: Three times a day (TID) | ORAL | Status: DC
  Administered 2013-10-03 – 2013-10-09 (×19): 300 mg via ORAL
  Filled 2013-10-03 (×24): qty 1

## 2013-10-03 MED ORDER — DOXYCYCLINE MONOHYDRATE 100 MG PO CAPS: 200.0000 mg | ORAL_CAPSULE | Freq: Every day | ORAL | Status: DC
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
## 2013-10-03 NOTE — Tx Team (Signed)
Interdisciplinary Treatment Plan Update (Adult)  Date: 10/03/2013  Time Reviewed:9:27 AM  Progress in Treatment:  Attending groups: No. Participating in groups: No.  Taking medication as prescribed: Yes  Tolerating medication: Yes  Family/Significant othe contact made: Not needed as pt did not endorse SI during admission.   Patient understands diagnosis: Yes, AEB seeing treatment for Benzo detox/ THC, crack cocaine abuse, mood stabilization, and medication management.  Discussing patient identified problems/goals with staff: Yes  Medical problems stabilized or resolved: Yes  Denies suicidal/homicidal ideation: Yes during admission, self report.  Patient has not harmed self or Others: Yes  New problem(Davila) identified:  Discharge Plan or Barriers: Pt hoping for admission to long term treatment after detox. CSW assessing.  Additional comments: 33 year old male pt admitted on voluntary basis. Pt reports that he came in to the hospital last night seeking detox from polysubstance abuse. Pt denies any SI and is able to contract for safety on the unit. Pt reports that he has been hospitalized in the past at [facility] for substance abuse issues. Pt reported that he would like to have some type of long term treatment when he discharges from here. Pt reports being transgender and going through hormone therapy and prefers to go by the name "Joshua Davila". Pt was oriented to the unit and safety maintained. Reason for Continuation of Hospitalization: Librium taper Med management Mood stabilization Estimated length of stay: 3-5 days  For review of initial/current patient goals, please see plan of care.  Attendees:  Patient:    Family:    Physician: Joshua LyonsIrving Lugo MD 10/03/2013 9:26 AM   Nursing: Joshua Davila. RN 10/03/2013 9:26 AM   Clinical Social Worker Joshua Davila, LCSWA  10/03/2013 9:26 AM   Other: Joshua Davila. RN  10/03/2013 9:26 AM   Other:    Other: Joshua Davila. Nurse CM 10/03/2013 9:26 AM   Other:     Scribe for Treatment Team:  The Sherwin-WilliamsHeather Davila LCSWA 10/03/2013 9:26 AM

## 2013-10-03 NOTE — [facility] Suicide Risk Assessment (Signed)
Suicide Risk Assessment  Admission Assessment     Nursing information obtained from:    Demographic factors:    Current Mental Status:    Loss Factors:    Historical Factors:    Risk Reduction Factors:    Total Time spent with patient: 45 minutes  CLINICAL FACTORS:   Depression:   Comorbid alcohol abuse/dependence Alcohol/Substance Abuse/Dependencies  PsycCOGNITIVE FEATURES THAT CONTRIBUTE TO RISK:  Closed-mindedness Polarized thinking Thought constriction (tunnel vision)    SUICIDE RISK:   Moderate:  Frequent suicidal ideation with limited intensity, and duration, some specificity in terms of plans, no associated intent, good self-control, limited dysphoria/symptomatology, some risk factors present, and identifiable protective factors, including available and accessible social support.  PLAN OF CARE: Supportive approach/coping skills/relapse prevention                               Detox                               Reassess and address the co morbidities  I certify that inpatient services furnished can reasonably be expected to improve the patient's condition.  Chrisanne Loose A 10/03/2013, 6:32 PM

## 2013-10-03 NOTE — Progress Notes (Signed)
Recreation Therapy Notes  Animal-Assisted Activity/Therapy (AAA/T) Program Checklist/Progress Notes Patient Eligibility Criteria Checklist & Daily Group note for Rec Tx Intervention  Date: 07.14.2015 Time: 2:45pm [address]    AAA/T Program Assumption of Risk Form signed by Patient/ or Parent Legal Guardian yes  Patient is free of allergies or sever asthma yes  Patient reports no fear of animals yes  Patient reports no history of cruelty to animals yes   Patient understands his/her participation is voluntary yes  Patient washes hands before animal contact yes  Patient washes hands after animal contact yes  Behavioral Response: Engaged, Appropriate   Education: Hand Washing, Appropriate Animal Interaction   Education Outcome: Acknowledges understanding  Clinical Observations/Feedback: Patient engaged appropriately with therapy dog and peers in session. Patient asked appropriate questions about therapy dog and his training.   Marykay Lexenise L Keyosha Tiedt, LRT/CTRS  Clairessa Boulet L 10/03/2013 5:00 PM

## 2013-10-03 NOTE — Progress Notes (Signed)
Patient attended AA meeting. Tonight we had a speaker meeting. They actively listened. Joshua Davila, Joshua Davila A 10:21 PM

## 2013-10-03 NOTE — [facility] Group Notes (Signed)
[facility] Group Notes:  (Nursing/MHT/Case Management/Adjunct)  Date:  10/03/2013  Time:  0845  Type of Therapy:  Psychoeducational Skills  Participation Level:  Did not attend  Participation Quality:    Affect:    Cognitive:    Insight:    Engagement in Group:    Modes of Intervention:    Summary of Progress/Problems:Morning wellness group; Sleep Hygiene    Manuela Schwartzritchett, [facility] 10/03/2013, 12:41 PM
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
## 2013-10-03 NOTE — BHH Group Notes (Signed)
BHH LCSW Group Therapy  10/03/2013 2:48 PM  Type of Therapy:  Group Therapy  Participation Level:  Appropriate   Participation Quality:  Attentive  Affect:  Appropriate  Cognitive:  Alert and Oriented  Insight:  Engaged  Engagement in Therapy:  Improving  Modes of Intervention:  Confrontation, Discussion, Education, Exploration, Problem-solving, Rapport Building, Socialization and Support  Summary of Progress/Problems: MHA Speaker came to talk about his personal journey with substance abuse and addiction. The pt processed ways by which to relate to the speaker. MHA speaker provided handouts and educational information pertaining to groups and services offered by the [facility]. [name] asked several questions about pt's story and thanked the speaker for coming.    Smart, Ethelle Ola LCSWA  10/03/2013, 2:48 PM

## 2013-10-03 NOTE — BHH Suicide Risk Assessment (Signed)
BHH INPATIENT: Family/Significant Other Suicide Prevention Education  Suicide Prevention Education:  Education Completed; No one has been identified by the patient as the family member/significant other with whom the patient will be residing, and identified as the person(s) who will aid the patient in the event of a mental health crisis (suicidal ideations/suicide attempt).   Pt did not c/o SI at admission, nor have they endorsed SI during their stay here. SPE not required. SPI pamphlet provided to pt and he was encouraged to share information with support network, ask questions, and talk about any concerns relating to SPE.  The Sherwin-WilliamsHeather Smart, LCSWA 10/03/2013 12:49 PM

## 2013-10-03 NOTE — Clinical Social Work Note (Signed)
Per pt request, CSW left message with Jonelle Sidleachel Hibbard (counselor) at [facility] who is working with pt to secure bed [place] requesting call back. [phone].   [address] Sycam[address] CircleHeather Smart, LCSWA 10/03/2013 12:54 PM   [facility] [place] Cephas DarbyLinda Richards (admissions coordinator): [phone] ext [phone]. Pt encouraged to call both Fleet ContrasRachel and Bonita QuinLinda in order for them to document her initiative and motivation for treatment, which has been a concern in the past according to NiagaraRachel.   The Sherwin-WilliamsHeather Smart, LCSWA  10/03/2013 1:00 PM

## 2013-10-03 NOTE — Progress Notes (Signed)
Patient ID: Joshua Davila BogCharlynn Grimesgs, male   DOB: 08-Jun-1980, 33 y.o.   MRN: [id number] He has been up and about interacting more today with peers and staff today. He has attended groups has not filled out his Self inventory, but said he would.  He was concerned about getting all his medication ordered this morning and he isn't now after meds were ordered.

## 2013-10-04 DIAGNOSIS — F1114 Opioid abuse with opioid-induced mood disorder: Secondary | ICD-10-CM | POA: Diagnosis present

## 2013-10-04 DIAGNOSIS — F1320 Sedative, hypnotic or anxiolytic dependence, uncomplicated: Secondary | ICD-10-CM | POA: Diagnosis present

## 2013-10-04 DIAGNOSIS — F1420 Cocaine dependence, uncomplicated: Secondary | ICD-10-CM | POA: Diagnosis present

## 2013-10-04 MED ORDER — NICOTINE POLACRILEX 2 MG MT GUM
2.0000 mg | CHEWING_GUM | OROMUCOSAL | Status: DC | PRN
Start: 2013-10-04 — End: 2013-10-09
  Administered 2013-10-04 – 2013-10-08 (×7): 2 mg via ORAL
  Filled 2013-10-04 (×6): qty 1

## 2013-10-04 MED ORDER — NICOTINE POLACRILEX 2 MG MT GUM
CHEWING_GUM | OROMUCOSAL | Status: AC
  Administered 2013-10-04: 19:00:00
  Filled 2013-10-04: qty 1

## 2013-10-04 MED ORDER — HYDROCORTISONE 1 % EX CREA
TOPICAL_CREAM | Freq: Every day | CUTANEOUS | Status: DC
  Administered 2013-10-04 – 2013-10-09 (×5): via TOPICAL
  Filled 2013-10-04: qty 28

## 2013-10-04 NOTE — Progress Notes (Signed)
[facility] MD Progress Note  10/04/2013 4:35 PM Joshua Davila  MRN:  [id number] Subjective:  Joshua Davila is able to share events in her life that have been traumatic. Dealing with a lot of anxiety, worry. She is committed to make this work. States she has a lot of support and wants to show gratitude by pursuing this treatment. Willing to go to the longer term program in [place]. States they help you adapt thrive. She is admits that she is very emotional and that she feels deeply for people. 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  HENT: Negative.   Respiratory: Negative.   Cardiovascular: Negative.   Gastrointestinal: Negative.   Genitourinary: Negative.   Musculoskeletal: Negative.   Skin: Negative.   Neurological: Positive for weakness.  Endo/Heme/Allergies: Negative.   Psychiatric/Behavioral: Positive for depression and substance abuse. The patient is nervous/anxious.     Blood pressure 123/79, pulse 89, temperature 98.7 F (37.1 C), temperature source Oral, resp. rate 16, height 5\' 10"  (1.778 m), weight 81.647 kg (180 lb), SpO2 98.00%.Body mass index is 25.83 kg/(m^2).  General Appearance: Fairly Groomed   Patent attorney::  Fair  Speech:  Clear and Coherent  Volume:  Decreased  Mood:  Anxious and sad, worried  Affect:  Labile and Tearful  Thought Process:  Coherent and Goal Directed  Orientation:  Full (Time, Place, and Person)  Thought Content:  symptoms, worries, concerns  Suicidal Thoughts:  No  Homicidal Thoughts:  No  Memory:  Immediate;   Fair Recent;   Fair Remote;   Fair  Judgement:  Fair  Insight:  Present  Psychomotor Activity:  Restlessness  Concentration:  Fair  Recall:  [employer] of Knowledge:NA  Language: Fair  Akathisia:  No  Handed:    AIMS (if indicated):     Assets:  Desire for Improvement Housing Social Support  Sleep:  Number of Hours: 6.75   Musculoskeletal: Strength & Muscle Tone: within normal limits Gait & Station: normal Patient leans: N/A  Current Medications: Current Facility-Administered Medications  Medication Dose Route Frequency Provider Last Rate Last Dose  . acetaminophen (TYLENOL) tablet 650 mg  650 mg Oral Q6H PRN Rachael Fee, MD   650 mg at 10/03/13 1716  . alum & mag hydroxide-simeth (MAALOX/MYLANTA) 200-200-20 MG/5ML suspension 30 mL  30 mL Oral Q4H PRN Rachael Fee, MD      . ARIPiprazole (ABILIFY) tablet 10 mg  10 mg Oral Daily Rachael Fee, MD   10 mg at 10/04/13 0805  . aspirin EC tablet 81 mg  81 mg Oral Daily Rachael Fee, MD   81 mg at 10/04/13 0804  . atorvastatin (LIPITOR) tablet 20 mg  20 mg Oral Daily Rachael Fee, MD   20 mg at 10/04/13 0805  . chlordiazePOXIDE (LIBRIUM)  capsule 25 mg  25 mg Oral Q6H PRN Rachael Fee, MD      . chlordiazePOXIDE (LIBRIUM) capsule 25 mg  25 mg Oral TID Rachael Fee, MD   25 mg at 10/04/13 1627   Followed by  . [START ON 10/05/2013] chlordiazePOXIDE (LIBRIUM) capsule 25 mg  25 mg Oral BH-qamhs Rachael Fee, MD       Followed by  . [START ON 10/07/2013] chlordiazePOXIDE (LIBRIUM) capsule 25 mg  25 mg Oral Daily Rachael Fee, MD      . doxycycline (VIBRA-TABS) tablet 100 mg  100 mg Oral  Q12H Rachael Fee, MD   100 mg at 10/04/13 0805  . emtricitabine-tenofovir (TRUVADA) 200-300 MG per tablet 1 tablet  1 tablet Oral Daily Rachael Fee, MD   1 tablet at 10/04/13 [id number]  . [START ON 10/08/2013] estradiol (CLIMARA - Dosed in mg/24 hr) patch 0.05 mg  0.05 mg Transdermal Weekly Rachael Fee, MD      . finasteride (PROSCAR) tablet 10 mg  10 mg Oral Daily Rachael Fee, MD   10 mg at 10/04/13 0803  . fluticasone (FLONASE) 50 MCG/ACT nasal spray 1 spray  1 spray Each Nare Daily Rachael Fee, MD   1 spray at 10/04/13 0804  . gabapentin (NEURONTIN) capsule 300 mg  300 mg Oral TID Rachael Fee, MD   300 mg at 10/04/13 1627  . hydrocortisone cream 1 %   Topical Daily Rachael Fee, MD      . hydrOXYzine (ATARAX/VISTARIL) tablet 25 mg  25 mg Oral Q6H PRN Rachael Fee, MD   25 mg at 10/02/13 2210  . loperamide (IMODIUM) capsule 2-4 mg  2-4 mg Oral PRN Rachael Fee, MD      . magnesium hydroxide (MILK OF MAGNESIA) suspension 30 mL  30 mL Oral Daily PRN Rachael Fee, MD      . MINOXIDIL (TOPICAL) 5 % SOLN 1 application  1 application Topical BID Rachael Fee, MD   1 application at 10/04/13 1629  . multivitamin with minerals tablet 1 tablet  1 tablet Oral Daily Rachael Fee, MD   1 tablet at 10/04/13 0805  . nicotine (NICODERM CQ - dosed in mg/24 hours) patch 21 mg  21 mg Transdermal Q0600 Rachael Fee, MD   21 mg at 10/04/13 0630  . omega-3 acid ethyl esters (LOVAZA) capsule 2 g  2 g Oral Daily Rachael Fee, MD   2 g at 10/04/13 0804  . ondansetron (ZOFRAN-ODT) disintegrating tablet 4 mg  4 mg Oral Q6H PRN Rachael Fee, MD      . pantoprazole (PROTONIX) EC tablet 40 mg  40 mg Oral Daily Rachael Fee, MD   40 mg at 10/04/13 0804  . PARoxetine (PAXIL) tablet 20 mg  20 mg Oral Daily Rachael Fee, MD   20 mg at 10/04/13 0804  . psyllium (HYDROCIL/METAMUCIL) packet 1 packet  1 packet Oral BID Rachael Fee, MD   1 packet at 10/04/13 1628  . senna-docusate (Senokot-S) tablet 1 tablet  1  tablet Oral Daily Rachael Fee, MD   1 tablet at 10/04/13 0804  . thiamine (VITAMIN B-1) tablet 100 mg  100 mg Oral Daily Rachael Fee, MD   100 mg at 10/04/13 9604    Lab Results: No results found for this or any previous visit (from the past 48 hour(s)).  Physical Findings: AIMS: Facial and  Oral Movements Muscles of Facial Expression: None, normal Lips and Perioral Area: None, normal Jaw: None, normal Tongue: None, normal,Extremity Movements Upper (arms, wrists, hands, fingers): None, normal Lower (legs, knees, ankles, toes): None, normal, Trunk Movements Neck, shoulders, hips: None, normal, Overall Severity Severity of abnormal movements (highest score from questions above): None, normal Incapacitation due to abnormal movements: None, normal Patient's awareness of abnormal movements (rate only patient's report): No Awareness, Dental Status Current problems with teeth and/or dentures?: No Does patient usually wear dentures?: No  CIWA:  CIWA-Ar Total: 1 COWS:     Treatment Plan Summary: Daily contact with patient to assess and evaluate symptoms and progress in treatment Medication management  Plan: Supportive approach/coping skills/relapse prevention           Continue Detox/reassess and address the co morbidities           Facilitate [facility] to the [facility]  Medical Decision Making Problem Points:  Review of psycho-social stressors (1) Data Points:  Review of medication regiment & side effects (2) Review of new medications or change in dosage (2)  I certify that inpatient services furnished can reasonably be expected to improve the patient's condition.   Joshua Davila A 10/04/2013, 4:35 PM

## 2013-10-04 NOTE — BHH Group Notes (Signed)
BHH LCSW Group Therapy  10/04/2013 2:44 PM  Type of Therapy:  Group Therapy  Participation Level:  Active  Participation Quality:  Attentive  Affect:  Appropriate  Cognitive:  Alert and Oriented  Insight:  Improving  Engagement in Therapy:  Engaged  Modes of Intervention:  Confrontation, Discussion, Education, Exploration, Problem-solving, Rapport Building, Socialization and Support  Summary of Progress/Problems: Emotion Regulation: This group focused on both positive and negative emotion identification and allowed group members to process ways to identify feelings, regulate negative emotions, and find healthy ways to manage internal/external emotions. Group members were asked to reflect on a time when their reaction to an emotion led to a negative outcome and explored how alternative responses using emotion regulation would have benefited them. Group members were also asked to discuss a time when emotion regulation was utilized when a negative emotion was experienced. Joshua Davila was attentive and engaged throughout today's therapy session. She shared that anxiety (worrying about the future in particular) is a difficult emotion for her to manage. Joshua Davila shows progress in the group setting and improving insight AEB her ability to identify how anxiety feels both physically and mentally. Joshua Davila shared that she must continue working on coping skills that are healthy (exercise, going for a walk, reaching out to my partner for support, and going to longer term treatment) in order to regulate anxiety in the future.   Joshua Davila, Joshua Davila LCSWA  10/04/2013, 2:44 PM

## 2013-10-04 NOTE — B[facility] Group Notes (Signed)
[facility]   10/04/2013 9:57 AM  Participation Quality:  Appropriate   Mood/Affect:  Anxious  Depression Rating:  8  Anxiety Rating:  10  Thoughts of Suicide:  No Will you contract for safety?   NA  Current AVH:  No  Plan for Discharge/Comments:  Pt states that he spoke with [facility] intake and CSW must fax ROI over before continuing with referral process. CSW assessing. Pt hopeful about getting into program and stated that his phone interview is scheduled for tomorrow morning. Mild withdrawals reported "the medication is really helping wit that but my anxiety is still high."   Transportation Means: unknown at this time.   Supports: partner/some friend supports. No family supports   Smart, OncologistHeather LCSWA

## 2013-10-04 NOTE — Progress Notes (Signed)
Patient ID: Joshua GrimesJames B Davila, male   DOB: 20-Sep-1980, 33 y.o.   MRN: [id number]   D: Pt engaged the writer in conversation more today than previous day.  However, when asked about the conversation between him and his doctor pt stated, "oh, we talked about the world."  Writer asked the pt to discuss care and any discharge plans. Pt informed the writer that he was informed that he was going to place that's "similar to a day spa". Pt smiled as he spoke and stated he was ready to go. Stated, "I need help every now and then".   A:  Support and encouragement was offered. 15 min checks continued for safety.  R: Pt remains safe.

## 2013-10-04 NOTE — Progress Notes (Addendum)
D.  Pt pleasant on approach, denies complaints at this time other than feeling "wiped out" today.  Pt with new orders for hydrocortisone cream, received from pharmacy.  He states that [place] goal [place]   Pt plans to speak to case management about this.  Pt is a Cytogeneticistveteran.   Pt reports poor sleep last night.  Denies SI/HI/hallucinations at this time.  Interacting appropriately with peers on unit.   A.  Support and encouragement offered  R.  PT remains safe on unit, will continue to monitor.

## 2013-10-04 NOTE — Clinical Social Work Note (Signed)
CSW spoke with Cephas DarbyLinda Richards at [place]. She stated that pt must be screened by phone on Monday (pt's anticipated d/c date). Pt will likely enter program during the last week of July due to waitlist. Bonita QuinLinda requesting Meds, SRA, H&P, and progress notes for pt on day of d/c-to be sent by CSW. All other records will be sent by Medical Records a few days after d/c. Pt notified of above and stated that he can stay with his partner until accepted into program and will [facility] if necessary (must walk in for assessment due to number of no shows and being dropped from receiving services), [facility], and NA.   The Sherwin-WilliamsHeather Smart, LCSWA 10/04/2013 3:07 PM

## 2013-10-04 NOTE — Progress Notes (Signed)
Pt attended NA group this evening.  

## 2013-10-04 NOTE — Progress Notes (Signed)
Patient ID: Joshua GrimesJames B Narducci, male   DOB: [birth year], [age] y.o.   MRN: [id number]  D: Pt was pleasant. Pt didn't complain of any pain during the assessment. Pt was very accommodating and apologetic even though he did nothing to apologize for. Pt was superficial with the writer often times discussing dialectic from different areas as well as his fiance. Pt voiced no questions or concerns.   A:  Support and encouragement was offered. 15 min checks continued for safety.  R: Pt remains safe.

## 2013-10-05 DIAGNOSIS — F39 Unspecified mood [affective] disorder: Secondary | ICD-10-CM

## 2013-10-05 MED ORDER — CARBAMAZEPINE ER 200 MG PO TB12
200.0000 mg | ORAL_TABLET | Freq: Two times a day (BID) | ORAL | Status: DC
  Administered 2013-10-05 – 2013-10-09 (×9): 200 mg via ORAL
  Filled 2013-10-05 (×14): qty 1

## 2013-10-05 MED ORDER — OLANZAPINE 10 MG PO TBDP
10.0000 mg | ORAL_TABLET | Freq: Three times a day (TID) | ORAL | Status: DC | PRN
  Administered 2013-10-06 – 2013-10-09 (×5): 10 mg via ORAL
  Filled 2013-10-05 (×5): qty 1

## 2013-10-05 MED ORDER — OLANZAPINE 10 MG PO TBDP
10.0000 mg | ORAL_TABLET | Freq: Once | ORAL | Status: AC
  Administered 2013-10-05: 10 mg via ORAL
  Filled 2013-10-05 (×2): qty 1

## 2013-10-05 NOTE — Progress Notes (Signed)
Patient ID: Joshua Davila, male   DOB: 1980/09/19, 33 y.o.   MRN: [id number] The medication given this AM has been effective. He has been calm and cooperative interacting appropriately with everyone, At this time he is in room sleeping.

## 2013-10-05 NOTE — Progress Notes (Addendum)
Patient ID: Joshua GrimesJames B Davila, male   DOB: 09-11-1980, 33 y.o.   MRN: [id number] She has been very angry agitated today stated he was having mood swings and he felt like breaking something if he didn't get help.  Stated that he felt that like people were avoiding him.   10:15 she has been seen by Dr. And new orders were given.

## 2013-10-05 NOTE — BHH Group Notes (Signed)
0900 nursing orientation group    The focus of this group is to educate the patient on the purpose and policies of crisis stabilization and provide a format to answer questions about their admission.  The group details unit policies and expectations of patients while admitted.   Pt was an active participant but had to be redirected back on track several times.  His peers seemed a little miffed at him but the group was able to continue.

## 2013-10-05 NOTE — Progress Notes (Signed)
Patient ID: Joshua GrimesJames B Davila, male   DOB: [birth year], 33 y.o.   MRN: [id number]   D: Pt came to the nurses station immediately after report. When asked if he needed help, pt stated, "Just needed a consult". When asked what kind of consult and from whom, pt stated, "my nurse".  Pt informed Clinical research associatewriter that she feels "groggy". Stated, "I can't read and I feel depressed. Will I be getting this medicine tomorrow?" Informed the pt that she was given a prn med and if she doesn't need it tomorrow then she won't get it.  Pt asked if she could get "something tonight for depression". Writer informed the pt that she's been given meds and that it may take several weeks before she's feeling better. Informed that she didn't have any "depression meds" scheduled for hs.  A:  Support and encouragement was offered. 15 min checks continued for safety.  R: Pt remains safe.

## 2013-10-05 NOTE — Progress Notes (Signed)
[facility] MD Progress Note  10/05/2013 3:46 PM BURNS TIMSON  MRN:  [id number] Subjective:  [name] had a very difficult time in the morning. She asked for help controlling herself. States she did not want to go off, brake something. Stated that she thought it was her PTSD acting out. Out of here she would cope by using. Here she has a very hard time coping Diagnosis:   DSM5: Schizophrenia Disorders:  none Obsessive-Compulsive Disorders:  none Trauma-Stressor Disorders:  Posttraumatic Stress Disorder (309.81) Substance/Addictive Disorders:  Benzodiazepine Dependence, Cocaine Dependence Depressive Disorders:  Major Depressive Disorder - Moderate (296.22) Total Time spent with patient: 30 minutes  Axis I: Mood Disorder NOS and Substance Induced Mood Disorder  ADL's:  Intact  Sleep: Fair  Appetite:  Fair  Suicidal Ideation:  Plan:  denies Intent:  denies Means:  denies Homicidal Ideation:  Plan:  denies Intent:  denies Means:  denies AEB (as evidenced by):  Psychiatric Specialty Exam: Physical Exam  Review of Systems  Constitutional: Positive for malaise/fatigue.  HENT: Negative.   Eyes: Negative.   Respiratory: Negative.   Cardiovascular: Negative.   Gastrointestinal: Negative.   Genitourinary: Negative.   Musculoskeletal: Negative.   Skin: Negative.   Neurological: Negative.   Endo/Heme/Allergies: Negative.   Psychiatric/Behavioral: Positive for depression and substance abuse. The patient is nervous/anxious.     Blood pressure 105/75, pulse 83, temperature 98.2 F (36.8 C), temperature source Oral, resp. rate 16, height 5\' 10"  (1.778 m), weight 81.647 kg (180 lb), SpO2 98.00%.Body mass index is 25.83 kg/(m^2).  General Appearance: Fairly Groomed  Patent attorney::  Fair  Speech:  Clear and Coherent  Volume:  fluctuates  Mood:  Angry, Dysphoric and Irritable  Affect:  Labile  Thought Process:  Coherent and Goal Directed  Orientation:  Full (Time, Place, and Person)  Thought  Content:  symptoms worries concerns fear of losing control  Suicidal Thoughts:  No  Homicidal Thoughts:  No  Memory:  Immediate;   Fair Recent;   Fair Remote;   Fair  Judgement:  Fair  Insight:  Present  Psychomotor Activity:  Restlessness  Concentration:  Fair  Recall:  [employer] of Knowledge:NA  Language: Fair  Akathisia:  No  Handed:    AIMS (if indicated):     Assets:  Desire for Improvement Housing Social Support Talents/Skills  Sleep:  Number of Hours: 6.75   Musculoskeletal: Strength & Muscle Tone: within normal limits Gait & Station: normal Patient leans: N/A  Current Medications: Current Facility-Administered Medications  Medication Dose Route Frequency Provider Last Rate Last Dose  . acetaminophen (TYLENOL) tablet 650 mg  650 mg Oral Q6H PRN Rachael Fee, MD   650 mg at 10/03/13 1716  . alum & mag hydroxide-simeth (MAALOX/MYLANTA) 200-200-20 MG/5ML suspension 30 mL  30 mL Oral Q4H PRN Rachael Fee, MD      . ARIPiprazole (ABILIFY) tablet 10 mg  10 mg Oral Daily Rachael Fee, MD   10 mg at 10/05/13 0804  . aspirin EC tablet 81 mg  81 mg Oral Daily Rachael Fee, MD   81 mg at 10/05/13 0803  . atorvastatin (LIPITOR) tablet 20 mg  20 mg Oral Daily Rachael Fee, MD   20 mg at 10/05/13 0803  . carbamazepine (TEGRETOL XR) 12 hr tablet 200 mg  200 mg Oral BID Rachael Fee, MD   200 mg at 10/05/13 1011  . chlordiazePOXIDE (LIBRIUM) capsule 25 mg  25 mg Oral Q6H PRN  Rachael FeeIrving A Edie Darley, MD      . chlordiazePOXIDE (LIBRIUM) capsule 25 mg  25 mg Oral BH-qamhs Rachael FeeIrving A Anyia Gierke, MD       Followed by  . [START ON 10/07/2013] chlordiazePOXIDE (LIBRIUM) capsule 25 mg  25 mg Oral Daily Rachael FeeIrving A Tamura Lasky, MD      . doxycycline (VIBRA-TABS) tablet 100 mg  100 mg Oral Q12H Rachael FeeIrving A Bardia Wangerin, MD   100 mg at 10/05/13 0803  . emtricitabine-tenofovir (TRUVADA) 200-300 MG per tablet 1 tablet  1 tablet Oral Daily Rachael FeeIrving A Tim Wilhide, MD   1 tablet at 10/05/13 0802  . [START ON 10/08/2013] estradiol (CLIMARA -  Dosed in mg/24 hr) patch 0.05 mg  0.05 mg Transdermal Weekly Rachael FeeIrving A Mykeal Carrick, MD      . finasteride (PROSCAR) tablet 10 mg  10 mg Oral Daily Rachael FeeIrving A Rylynn Schoneman, MD   10 mg at 10/05/13 0804  . fluticasone (FLONASE) 50 MCG/ACT nasal spray 1 spray  1 spray Each Nare Daily Rachael FeeIrving A Natiya Seelinger, MD   1 spray at 10/05/13 0802  . gabapentin (NEURONTIN) capsule 300 mg  300 mg Oral TID Rachael FeeIrving A Buel Molder, MD   300 mg at 10/05/13 1151  . hydrocortisone cream 1 %   Topical Daily Rachael FeeIrving A Almond Fitzgibbon, MD      . hydrOXYzine (ATARAX/VISTARIL) tablet 25 mg  25 mg Oral Q6H PRN Rachael FeeIrving A Djon Tith, MD   25 mg at 10/04/13 2114  . loperamide (IMODIUM) capsule 2-4 mg  2-4 mg Oral PRN Rachael FeeIrving A Satya Bohall, MD      . magnesium hydroxide (MILK OF MAGNESIA) suspension 30 mL  30 mL Oral Daily PRN Rachael FeeIrving A Kiernan Farkas, MD      . MINOXIDIL (TOPICAL) 5 % SOLN 1 application  1 application Topical BID Rachael FeeIrving A Thao Vanover, MD   1 application at 10/05/13 0805  . multivitamin with minerals tablet 1 tablet  1 tablet Oral Daily Rachael FeeIrving A Lucie Friedlander, MD   1 tablet at 10/05/13 0804  . nicotine polacrilex (NICORETTE) gum 2 mg  2 mg Oral PRN Rachael FeeIrving A Mabel Unrein, MD   2 mg at 10/05/13 1305  . omega-3 acid ethyl esters (LOVAZA) capsule 2 g  2 g Oral Daily Rachael FeeIrving A Jakera Beaupre, MD   2 g at 10/05/13 0802  . ondansetron (ZOFRAN-ODT) disintegrating tablet 4 mg  4 mg Oral Q6H PRN Rachael FeeIrving A Klea Nall, MD      . pantoprazole (PROTONIX) EC tablet 40 mg  40 mg Oral Daily Rachael FeeIrving A Gerrianne Aydelott, MD   40 mg at 10/05/13 0803  . PARoxetine (PAXIL) tablet 20 mg  20 mg Oral Daily Rachael FeeIrving A Latavius Capizzi, MD   20 mg at 10/05/13 0803  . psyllium (HYDROCIL/METAMUCIL) packet 1 packet  1 packet Oral BID Rachael FeeIrving A Gaberial Cada, MD   1 packet at 10/05/13 [phone]  . senna-docusate (Senokot-S) tablet 1 tablet  1 tablet Oral Daily Rachael FeeIrving A Jene Oravec, MD   1 tablet at 10/05/13 [phone]  . thiamine (VITAMIN B-1) tablet 100 mg  100 mg Oral Daily Rachael FeeIrving A Bryer Cozzolino, MD   100 mg at 10/05/13 0803    Lab Results: No results found for this or any previous visit (from the past 48  hour(s)).  Physical Findings: AIMS: Facial and Oral Movements Muscles of Facial Expression: None, normal Lips and Perioral Area: None, normal Jaw: None, normal Tongue: None, normal,Extremity Movements Upper (arms, wrists, hands, fingers): None, normal Lower (legs, knees, ankles, toes): None, normal, Trunk Movements Neck, shoulders, hips: None, normal, Overall Severity  Severity of abnormal movements (highest score from questions above): None, normal Incapacitation due to abnormal movements: None, normal Patient's awareness of abnormal movements (rate only patient's report): No Awareness, Dental Status Current problems with teeth and/or dentures?: No Does patient usually wear dentures?: No  CIWA:  CIWA-Ar Total: 2 COWS:     Treatment Plan Summary: Daily contact with patient to assess and evaluate symptoms and progress in treatment Medication management  Plan: Supportive approach/coping skills/relapse prevention           CBT/mindfulness/stress/anger management            Trial with Zyprexa Zydis 10 mg              Tegretol 200 mg BID  Medical Decision Making Problem Points:  Established problem, worsening (2) and Review of psycho-social stressors (1) Data Points:  Review of medication regiment & side effects (2) Review of new medications or change in dosage (2)  I certify that inpatient services furnished can reasonably be expected to improve the patient's condition.   Torrell Krutz A 10/05/2013, 3:46 PM

## 2013-10-05 NOTE — [facility] Group Notes (Signed)
[facility] LCSW Group Therapy  10/05/2013 2:12 PM  Type of Therapy:  Group Therapy  Participation Level:  Active  Participation Quality:  Attentive  Affect:  Depressed and Flat  Cognitive:  Oriented  Insight:  Improving  Engagement in Therapy:  Improving  Modes of Intervention:  Confrontation, Discussion, Education, Exploration, Problem-solving, Rapport Building, Socialization and Support  Summary of Progress/Problems:  Finding Balance in Life. Today's group focused on defining balance in one's own words, identifying things that can knock one off balance, and exploring healthy ways to maintain balance in life. Group members were asked to provide an example of a time when they felt off balance, describe how they handled that situation,and process healthier ways to regain balance in the future. Group members were asked to share the most important tool for maintaining balance that they learned [facility] and how they plan to apply this method after discharge. Joshua Davila was attentive and engaged throughout today's therapy group. She shared that "mood instability and not being able to cope with small stressors" causes her to feel "off balance." Joshua Davila shared her experience this morning as an example of this. "I had a melt down and was demanding to be medicated." Pt shows progress in the group setting and improving insight AEB her ability to process how learning healthier coping skills, exercising, and pursuing further treatment will help her reestablish a sense of balance in life.     Smart, Joshua Davila LCSWA  10/05/2013, 2:12 PM

## 2013-10-06 MED ORDER — BUPROPION HCL ER (XL) 150 MG PO TB24
150.0000 mg | ORAL_TABLET | Freq: Every day | ORAL | Status: DC
  Administered 2013-10-06 – 2013-10-09 (×4): 150 mg via ORAL
  Filled 2013-10-06 (×7): qty 1

## 2013-10-06 NOTE — Tx Team (Signed)
Interdisciplinary Treatment Plan Update (Adult)  Date: 10/06/2013  Time Reviewed:10:22 AM  Progress in Treatment:  Attending groups: Yes  Participating in groups: Yes   Taking medication as prescribed: Yes  Tolerating medication: Yes  Family/Significant othe contact made: Not needed as pt did not endorse SI during admission.   Patient understands diagnosis: Yes, AEB seeing treatment for Benzo detox/ THC, crack cocaine abuse, mood stabilization, and medication management.  Discussing patient identified problems/goals with staff: Yes  Medical problems stabilized or resolved: Yes  Denies suicidal/homicidal ideation: Yes during admission, self report.  Patient has not harmed self or Others: Yes  New problem(s) identified:  Discharge Plan or Barriers: CS[place] [facility]. Phone interview scheduled for Monday morning at 10am prior to d/c. Pt will return to boyfriend's home until admission the following week. [facility] as he was dropped due to frequency of no-shows.  Additional comments: Pt reporting that he feels "drugged up and like a zombie." MD notified. Pt reporting mood lability. Reason for Continuation of Hospitalization: Med management Mood stabilization Estimated length of stay: 2-3 days (tentaive d/c scheduled for Monday) For review of initial/current patient goals, please see plan of care.  Attendees:  Patient:    Family:    Physician: Geoffery LyonsIrving Lugo MD 10/06/2013 10:22 AM   Nursing: Vanessa KickJan RN  10/06/2013 10:22 AM   Clinical Social Worker Jaylyn Booher Smart, LCSWA  10/06/2013 10:22 AM   Other: Lowanda FosterBrittany RN  10/06/2013 10:22 AM   Other: Marylu LundJanet RN   Other: Darden DatesJennifer C. Nurse CM 10/06/2013 10:22 AM   Other:    Scribe for Treatment Team:  The Sherwin-WilliamsHeather Smart LCSWA 10/06/2013 10:22 AM

## 2013-10-06 NOTE — [facility] Group Notes (Signed)
[facility] LCSW Group Therapy  10/06/2013 2:17 PM  Type of Therapy:  Group Therapy  Participation Level:  Did Not Attend-pt in room sleeping.   Smart, Dayshaun Whobrey LCSWA  10/06/2013, 2:17 PM

## 2013-10-06 NOTE — Progress Notes (Signed)
Pt attended AA group 

## 2013-10-06 NOTE — Progress Notes (Signed)
D:Pt presents with mild anxiety and reports that he has felt sleepy this morning. Pt reports that the morning group is interesting to him and how it relates to manipulation. He talked about how his behaviors are manipulating toward his partner.  A:Offered support, encouragement and 15 minute checks. R:Pt denies si and hi. Safety maintained on the unit.

## 2013-10-06 NOTE — [facility] Group Notes (Signed)
[facility] Note   10/06/2013 9:19 AM  Participation Quality:  Appropriate   Mood/Affect:  Flat  Depression Rating:  3  Anxiety Rating:  0  Thoughts of Suicide:  No Will you contract for safety?   NA  Current AVH:  No  Plan for Discharge/Comments: Pt states the he feels like a zombie. Pt states that he has been out of it since getting medication yesterday morning. [place] Phone interview with Cephas DarbyLinda Richards on Monday morning.      Transportation Means: boyfriend   Supports: boyfriend   Counselling psychologistmart, OncologistHeather LCSWA

## 2013-10-06 NOTE — Progress Notes (Signed)
Pt c/o feeling tired earlier in the day but now feels she has more energy. Pt is pleasant and cooperative / She denies SI and HI and contracts for safety. Pt has been in and out of the dayroom.

## 2013-10-06 NOTE — Progress Notes (Signed)
[facility] MD Progress Note  10/06/2013 3:33 PM YASEEN GILBERG  MRN:  [id number] Subjective:  [name] states that she feels sedated, drowsy. Has not taken any more of the Zyprexa. Does endorse episodes of lack of control when he gets very upset agitated Willing to give the Tegretol longer if the sedation was to come from it. She is also experiencing the cumulative effect of Librium. Asked again about Wellbutrin as she seems to have gotten some good results with it. States that by now he would have been using.  Diagnosis:   DSM5: Schizophrenia Disorders:  none Obsessive-Compulsive Disorders:  none Trauma-Stressor Disorders:  Posttraumatic Stress Disorder (309.81) Substance/Addictive Disorders:  Cocaine use disorder, Benzodiazepine use disorder Depressive Disorders:  Major Depressive Disorder - Moderate (296.22) Total Time spent with patient: 30 minutes  Axis I: Substance Induced Mood Disorder  ADL's:  Intact  Sleep: Poor  Appetite:  Fair  Suicidal Ideation:  Plan:  denies Intent:  denies Means:  denies Homicidal Ideation:  Plan:  denies Intent:  denies Means:  denies AEB (as evidenced by):  Psychiatric Specialty Exam: Physical Exam  Review of Systems  Constitutional: Positive for malaise/fatigue.  HENT: Negative.   Eyes: Negative.   Respiratory: Negative.   Cardiovascular: Negative.   Gastrointestinal: Negative.   Genitourinary: Negative.   Musculoskeletal: Negative.   Skin: Negative.   Neurological: Negative.   Endo/Heme/Allergies: Negative.   Psychiatric/Behavioral: Positive for substance abuse. The patient is nervous/anxious.     Blood pressure 125/74, pulse 69, temperature 97.8 F (36.6 C), temperature source Oral, resp. rate 18, height 5\' 10"  (1.778 m), weight 81.647 kg (180 lb), SpO2 98.00%.Body mass index is 25.83 kg/(m^2).  General Appearance: Fairly Groomed  Patent attorney::  Fair  Speech:  Clear and Coherent  Volume:  Normal  Mood:  Anxious, Depressed and worried   Affect:  anxious, worried  Thought Process:  Coherent and Goal Directed  Orientation:  Full (Time, Place, and Person)  Thought Content:  worries, concerns  Suicidal Thoughts:  No  Homicidal Thoughts:  No  Memory:  Immediate;   Fair Recent;   Fair Remote;   Fair  Judgement:  Fair  Insight:  Present  Psychomotor Activity:  Restlessness  Concentration:  Fair  Recall:  [employer] of Knowledge:NA  Language: Fair  Akathisia:  No  Handed:    AIMS (if indicated):     Assets:  Desire for Improvement Social Support  Sleep:  Number of Hours: 5.25   Musculoskeletal: Strength & Muscle Tone: within normal limits Gait & Station: normal Patient leans: N/A  Current Medications: Current Facility-Administered Medications  Medication Dose Route Frequency Provider Last Rate Last Dose  . acetaminophen (TYLENOL) tablet 650 mg  650 mg Oral Q6H PRN Rachael Fee, MD   650 mg at 10/03/13 1716  . alum & mag hydroxide-simeth (MAALOX/MYLANTA) 200-200-20 MG/5ML suspension 30 mL  30 mL Oral Q4H PRN Rachael Fee, MD      . ARIPiprazole (ABILIFY) tablet 10 mg  10 mg Oral Daily Rachael Fee, MD   10 mg at 10/06/13 0807  . aspirin EC tablet 81 mg  81 mg Oral Daily Rachael Fee, MD   81 mg at 10/06/13 0813  . atorvastatin (LIPITOR) tablet 20 mg  20 mg Oral Daily Rachael Fee, MD   20 mg at 10/06/13 0810  . buPROPion (WELLBUTRIN XL) 24 hr tablet 150 mg  150 mg Oral Daily Rachael Fee, MD   150 mg at  10/06/13 1034  . carbamazepine (TEGRETOL XR) 12 hr tablet 200 mg  200 mg Oral BID Rachael FeeIrving A Armarion Greek, MD   200 mg at 10/06/13 [id number]  . [START ON 10/07/2013] chlordiazePOXIDE (LIBRIUM) capsule 25 mg  25 mg Oral Daily Rachael FeeIrving A Korion Cuevas, MD      . doxycycline (VIBRA-TABS) tablet 100 mg  100 mg Oral Q12H Rachael FeeIrving A Savien Mamula, MD   100 mg at 10/06/13 0809  . emtricitabine-tenofovir (TRUVADA) 200-300 MG per tablet 1 tablet  1 tablet Oral Daily Rachael FeeIrving A Jaclynne Baldo, MD   1 tablet at 10/06/13 0809  . [START ON 10/08/2013] estradiol (CLIMARA -  Dosed in mg/24 hr) patch 0.05 mg  0.05 mg Transdermal Weekly Rachael FeeIrving A Marcellas Marchant, MD      . finasteride (PROSCAR) tablet 10 mg  10 mg Oral Daily Rachael FeeIrving A Kenyatta Gloeckner, MD   10 mg at 10/06/13 [id number]  . fluticasone (FLONASE) 50 MCG/ACT nasal spray 1 spray  1 spray Each Nare Daily Rachael FeeIrving A Arnett Duddy, MD   1 spray at 10/06/13 0804  . gabapentin (NEURONTIN) capsule 300 mg  300 mg Oral TID Rachael FeeIrving A Brynnley Dayrit, MD   300 mg at 10/06/13 1207  . hydrocortisone cream 1 %   Topical Daily Rachael FeeIrving A Akirra Lacerda, MD      . magnesium hydroxide (MILK OF MAGNESIA) suspension 30 mL  30 mL Oral Daily PRN Rachael FeeIrving A Nadya Hopwood, MD      . MINOXIDIL (TOPICAL) 5 % SOLN 1 application  1 application Topical BID Rachael FeeIrving A Patina Spanier, MD      . multivitamin with minerals tablet 1 tablet  1 tablet Oral Daily Rachael FeeIrving A Otto Caraway, MD   1 tablet at 10/06/13 0810  . nicotine polacrilex (NICORETTE) gum 2 mg  2 mg Oral PRN Rachael FeeIrving A Makilah Dowda, MD   2 mg at 10/06/13 0825  . OLANZapine zydis (ZYPREXA) disintegrating tablet 10 mg  10 mg Oral Q8H PRN Rachael FeeIrving A Keya Wynes, MD      . omega-3 acid ethyl esters (LOVAZA) capsule 2 g  2 g Oral Daily Rachael FeeIrving A Sreshta Cressler, MD   2 g at 10/06/13 [id number]  . pantoprazole (PROTONIX) EC tablet 40 mg  40 mg Oral Daily Rachael FeeIrving A Audryna Wendt, MD   40 mg at 10/06/13 [id number]  . PARoxetine (PAXIL) tablet 20 mg  20 mg Oral Daily Rachael FeeIrving A Tishawna Larouche, MD   20 mg at 10/06/13 [phone]  . psyllium (HYDROCIL/METAMUCIL) packet 1 packet  1 packet Oral BID Rachael FeeIrving A Bryce Kimble, MD   1 packet at 10/05/13 1719  . senna-docusate (Senokot-S) tablet 1 tablet  1 tablet Oral Daily Rachael FeeIrving A Smaran Gaus, MD   1 tablet at 10/06/13 0809  . thiamine (VITAMIN B-1) tablet 100 mg  100 mg Oral Daily Rachael FeeIrving A Willie Loy, MD   100 mg at 10/06/13 [id number]    Lab Results: No results found for this or any previous visit (from the past 48 hour(s)).  Physical Findings: AIMS: Facial and Oral Movements Muscles of Facial Expression: None, normal Lips and Perioral Area: None, normal Jaw: None, normal Tongue: None, normal,Extremity Movements Upper (arms,  wrists, hands, fingers): None, normal Lower (legs, knees, ankles, toes): None, normal, Trunk Movements Neck, shoulders, hips: None, normal, Overall Severity Severity of abnormal movements (highest score from questions above): None, normal Incapacitation due to abnormal movements: None, normal Patient's awareness of abnormal movements (rate only patient's report): No Awareness, Dental Status Current problems with teeth and/or dentures?: No Does patient usually wear dentures?: No  CIWA:  CIWA-Ar Total: 1  COWS:     Treatment Plan Summary: Daily contact with patient to assess and evaluate symptoms and progress in treatment Medication management  Plan: Supportive approach/coping skills/relapse prevention           Complete detox           Monitor SE and response to the Tegretol           Trial with Wellbutrin XL 150 mg in AM           CBT;mindfulness Medical Decision Making Problem Points:  Review of psycho-social stressors (1) Data Points:  Review of medication regiment & side effects (2) Review of new medications or change in dosage (2)  I certify that inpatient services furnished can reasonably be expected to improve the patient's condition.   Karima Carrell A 10/06/2013, 3:33 PM

## 2013-10-07 MED ORDER — PRAZOSIN HCL 1 MG PO CAPS
1.0000 mg | ORAL_CAPSULE | Freq: Every day | ORAL | Status: DC
  Administered 2013-10-07 – 2013-10-08 (×2): 1 mg via ORAL
  Filled 2013-10-07 (×4): qty 1

## 2013-10-07 NOTE — Progress Notes (Addendum)
Pt is very bright and cheerful this am. She stated all things are good and the only she she needs is estrodial this am as her patch fell off in the shower. Pt does contract for safety and denies SI and HI. Pt has been in the dayroom  With the other pts. Pt c/o having nightmares and would like meds for this. Pt would liike to make changes to his mental state. Pt also needs to try to avoid drugs per his self inventory,pt stated [place] was in [place] 26 months and then got an honorable discharge. 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

## 2013-10-07 NOTE — [facility] Group Notes (Signed)
[facility] Group Notes: coping skills  Date:  10/07/2013  Time:  2:25 PM  Type of Therapy:  Nurse Education  Participation Level:  Active  Participation Quality:  Appropriate  Affect:  Appropriate  Cognitive:  Alert  Insight:  Appropriate  Engagement in Group:  Engaged  Modes of Intervention:  Discussion  Summary of Progress/Problems:  Nicole CellaWebb, Imari Sivertsen Guyes 10/07/2013, 2:25 PM

## 2013-10-07 NOTE — Progress Notes (Signed)
Patient appear cheerful. Pt stated to the writer that she feels better this evening. Denies pain, SI, AH/VH. Offered support and encouragment. Encouraged patient to participate in scheduled group.  Patient compliant with medications. No new complaint.  Every 15 minutes checks. Will continue to monitor patient.

## 2013-10-07 NOTE — [facility] Group Notes (Signed)
[facility] Group Notes:  (Clinical Social Work)  10/07/2013     10-11AM  Summary of Progress/Problems:   The main focus of today's process group was for the patient to identify ways in which they have in the past sabotaged their own recovery. Motivational Interviewing and a worksheet were utilized to help patients explore in depth the perceived benefits and costs of their substance use, as well as the potential benefits and costs of stopping.  The Stages of Change were explained using a handout, with an emphasis on making plans to deal with sabotaging behaviors proactively.  The patient expressed that their self-sabotaging behavior is getting stuck in his own head, and creating his own reality, which leads to chaos.  He took extensive notes throughout group, and kept having CSW repeat phrases in order for him to copy them down.  He suddenly became disinterested about 20 minutes before the end of group and was in and out of the room several times before leaving and staying out.    His insight was minimal, as he kept making comments that were off topic.  Type of Therapy:  Group Therapy - Process   Participation Level:  Active  Participation Quality:  Attentive and Inattentive  Affect:  Blunted and Depressed  Cognitive:  Disorganized  Insight:  Limited  Engagement in Therapy:  Limited  Modes of Intervention:  Education, Support and Processing, Motivational Interviewing  Ambrose MantleMareida Grossman-Orr, LCSW 10/07/2013, 11:03 AM

## 2013-10-07 NOTE — Progress Notes (Signed)
Patient ID: Joshua GrimesJames B Schooler, male   DOB: [birth year], 33 y.o.   [id number] [facility]  10/07/2013 11:41 AM Joshua GrimesJames B Xie  MRN:  [id number]  Subjective:  [name] states that he had a brief period of aggravation with his PTSD. He says he feels like things are not going right for him. Says he is not sleeping well because of nightmares. He adds that his mind creates it's own reality, and he is allowing it to control his life. Modena SlaterJames Says he lack support of family and friends. He remains tearful throughout this follow-up assessment.  Diagnosis:   DSM5: Schizophrenia Disorders:  none Obsessive-Compulsive Disorders:  none Trauma-Stressor Disorders:  Posttraumatic Stress Disorder (309.81) Substance/Addictive Disorders:  Cocaine use disorder, Benzodiazepine use disorder Depressive Disorders:  Major Depressive Disorder - Moderate (296.22) Total Time spent with patient: 30 minutes  Axis I: Substance Induced Mood Disorder  ADL's:  Intact  Sleep: Poor due to nighmares  Appetite:  Fair  Suicidal Ideation:  Plan:  denies Intent:  denies Means:  denies Homicidal Ideation:  Plan:  denies Intent:  denies Means:  denies AEB (as evidenced by):  Psychiatric Specialty Exam: Physical Exam  Psychiatric: His speech is normal and behavior is normal. Judgment and thought content normal. His mood appears anxious. His affect is labile. Cognition and memory are normal. He exhibits a depressed mood.    Review of Systems  Constitutional: Positive for malaise/fatigue.  HENT: Negative.   Eyes: Negative.   Respiratory: Negative.   Cardiovascular: Negative.   Gastrointestinal: Negative.   Genitourinary: Negative.   Musculoskeletal: Negative.   Skin: Negative.   Neurological: Negative.   Endo/Heme/Allergies: Negative.   Psychiatric/Behavioral: Positive for depression and substance abuse. The patient is nervous/anxious.     Blood pressure 96/73, pulse 81, temperature 97.8 F (36.6 C),  temperature source Oral, resp. rate 18, height 5\' 10"  (1.778 m), weight 81.647 kg (180 lb), SpO2 98.00%.Body mass index is 25.83 kg/(m^2).  General Appearance: Fairly Groomed  Patent attorneyye Contact::  Fair  Speech:  Clear and Coherent  Volume:  Normal  Mood:  Anxious, Depressed and worried, tearful  Affect:  anxious, worried  Thought Process:  Coherent and Goal Directed  Orientation:  Full (Time, Place, and Person)  Thought Content:  worries, concerns  Suicidal Thoughts:  No  Homicidal Thoughts:  No  Memory:  Immediate;   Fair Recent;   Fair Remote;   Fair  Judgement:  Fair  Insight:  Present  Psychomotor Activity:  Restlessness  Concentration:  Fair  Recall:  [employer]Fair  Fund of Knowledge:NA  Language: Fair  Akathisia:  No  Handed:    AIMS (if indicated):     Assets:  Desire for Improvement Social Support  Sleep:  Number of Hours: 6.5   Musculoskeletal: Strength & Muscle Tone: within normal limits Gait & Station: normal Patient leans: N/A  Current Medications: Current Facility-Administered Medications  Medication Dose Route Frequency Provider Last Rate Last Dose  . acetaminophen (TYLENOL) tablet 650 mg  650 mg Oral Q6H PRN Rachael FeeIrving A Lugo, MD   650 mg at 10/03/13 1716  . alum & mag hydroxide-simeth (MAALOX/MYLANTA) 200-200-20 MG/5ML suspension 30 mL  30 mL Oral Q4H PRN Rachael FeeIrving A Lugo, MD      . ARIPiprazole (ABILIFY) tablet 10 mg  10 mg Oral Daily Rachael FeeIrving A Lugo, MD   10 mg at 10/07/13 0754  . aspirin EC tablet 81 mg  81 mg Oral Daily Rachael FeeIrving A Lugo, MD   [phone]  mg at 10/07/13 0946  . atorvastatin (LIPITOR) tablet 20 mg  20 mg Oral Daily Rachael Fee, MD   20 mg at 10/07/13 0755  . buPROPion (WELLBUTRIN XL) 24 hr tablet 150 mg  150 mg Oral Daily Rachael Fee, MD   150 mg at 10/07/13 0755  . carbamazepine (TEGRETOL XR) 12 hr tablet 200 mg  200 mg Oral BID Rachael Fee, MD   200 mg at 10/07/13 0754  . doxycycline (VIBRA-TABS) tablet 100 mg  100 mg Oral Q12H Rachael Fee, MD   100 mg at 10/07/13  0754  . emtricitabine-tenofovir (TRUVADA) 200-300 MG per tablet 1 tablet  1 tablet Oral Daily Rachael Fee, MD   1 tablet at 10/07/13 0754  . [START ON 10/08/2013] estradiol (CLIMARA - Dosed in mg/24 hr) patch 0.05 mg  0.05 mg Transdermal Weekly Rachael Fee, MD   0.05 mg at 10/07/13 1007  . finasteride (PROSCAR) tablet 10 mg  10 mg Oral Daily Rachael Fee, MD   10 mg at 10/07/13 0756  . fluticasone (FLONASE) 50 MCG/ACT nasal spray 1 spray  1 spray Each Nare Daily Rachael Fee, MD   1 spray at 10/07/13 0756  . gabapentin (NEURONTIN) capsule 300 mg  300 mg Oral TID Rachael Fee, MD   300 mg at 10/07/13 1118  . hydrocortisone cream 1 %   Topical Daily Rachael Fee, MD      . magnesium hydroxide (MILK OF MAGNESIA) suspension 30 mL  30 mL Oral Daily PRN Rachael Fee, MD      . MINOXIDIL (TOPICAL) 5 % SOLN 1 application  1 application Topical BID Rachael Fee, MD   1 application at 10/07/13 [phone]  . multivitamin with minerals tablet 1 tablet  1 tablet Oral Daily Rachael Fee, MD   1 tablet at 10/07/13 0755  . nicotine polacrilex (NICORETTE) gum 2 mg  2 mg Oral PRN Rachael Fee, MD   2 mg at 10/06/13 0825  . OLANZapine zydis (ZYPREXA) disintegrating tablet 10 mg  10 mg Oral Q8H PRN Rachael Fee, MD   10 mg at 10/07/13 0948  . omega-3 acid ethyl esters (LOVAZA) capsule 2 g  2 g Oral Daily Rachael Fee, MD   2 g at 10/07/13 0755  . pantoprazole (PROTONIX) EC tablet 40 mg  40 mg Oral Daily Rachael Fee, MD   40 mg at 10/07/13 0754  . PARoxetine (PAXIL) tablet 20 mg  20 mg Oral Daily Rachael Fee, MD   20 mg at 10/07/13 0758  . prazosin (MINIPRESS) capsule 1 mg  1 mg Oral QHS Sanjuana Kava, NP      . psyllium (HYDROCIL/METAMUCIL) packet 1 packet  1 packet Oral BID Rachael Fee, MD   1 packet at 10/07/13 [phone]  . senna-docusate (Senokot-S) tablet 1 tablet  1 tablet Oral Daily Rachael Fee, MD   1 tablet at 10/07/13 0754  . thiamine (VITAMIN B-1) tablet 100 mg  100 mg Oral Daily Rachael Fee, MD    100 mg at 10/07/13 6213    Lab Results: No results found for this or any previous visit (from the past 48 hour(s)).  Physical Findings: AIMS: Facial and Oral Movements Muscles of Facial Expression: None, normal Lips and Perioral Area: None, normal Jaw: None, normal Tongue: None, normal,Extremity Movements Upper (arms, wrists, hands, fingers): None, normal Lower (legs, knees, ankles, toes): None, normal, Trunk Movements  Neck, shoulders, hips: None, normal, Overall Severity Severity of abnormal movements (highest score from questions above): None, normal Incapacitation due to abnormal movements: None, normal Patient's awareness of abnormal movements (rate only patient's report): No Awareness, Dental Status Current problems with teeth and/or dentures?: No Does patient usually wear dentures?: No  CIWA:  CIWA-Ar Total: 0 COWS:     Treatment Plan Summary: Daily contact with patient to assess and evaluate symptoms and progress in treatment Medication management  Plan: Supportive approach/coping skills/relapse prevention Complete detox Monitor SE and response to the Tegretol Continue Wellbutrin XL 150 mg in AM CBT;mindfulness. Initiate Minipress 1 mg Q bedtime for sleep.  Medical Decision Making Problem Points:  Review of psycho-social stressors (1) Data Points:  Review of medication regiment & side effects (2) Review of new medications or change in dosage (2)  I certify that inpatient services furnished can reasonably be expected to improve the patient's condition.   Armandina Stammer I, PMHNP-BC 10/07/2013, 11:41 AM

## 2013-10-07 NOTE — Progress Notes (Signed)
Adult Psychoeducational Group Note  Date:  10/07/2013 Time:  10:04 AM  Group Topic/Focus:  Identifying Needs:   The focus of this group is to help patients identify their personal needs that have been historically problematic and identify healthy behaviors to address their needs.  Participation Level:  Active  Participation Quality:  Appropriate and Attentive  Affect:  Flat  Cognitive:  Alert and Appropriate  Insight: Improving  Engagement in Group:  Defensive  Modes of Intervention:  Activity, Clarification, Discussion, Problem-solving and Support  Additional Comments:  Pt attended the morning group and shared that he was still having nightmares and having trouble sleeping.  Pt shared with irritability that he has been telling the nurses and doctor that he cannot sleep and nothing has been done.  Pt was encouraged to continue to communicate with the doctors about his needs.  Pt plans to work on his discharge plans and made the request on his inventory that he wants to remove body hair.  (This was not addressed in the group setting).  Pt shared with his peers that [facility] and shared his experience with a male peer wanting to get in.  Pt has been observed pleasant and cooperative earlier in the day.  Landis MartinsGrace, Petina Muraski F 10/07/2013, 10:04 AM

## 2013-10-07 NOTE — Progress Notes (Signed)
[facility] Group Notes:  (Nursing/MHT/Case Management/Adjunct)  Date:  10/07/2013  Time:  6:28 PM  Type of Therapy:  Psychoeducational Skills  Participation Level:  Active  Participation Quality:  Appropriate and Attentive  Affect:  Appropriate  Cognitive:  Appropriate  Insight:  Appropriate and Good  Engagement in Group:  Engaged and Supportive  Modes of Intervention:  Activity  Summary of Progress/Problems: Pts played a game of Pictionary using coping skills.   Vicke Plotner C 10/07/2013, 6:28 PM 

## 2013-10-08 NOTE — Progress Notes (Signed)
D: Patient stated "I don't know what I'm feeling inside me. I cannot explain that. Like I am anxious of nothing, unsure of the future. I am kind of empty". Patient requested some medications that will help control his feelings and the nightmares. Patient denied SI, AH/VH. Patient also denied pain.  A: Patient was given support and encouragement. Medications given as scheduled. Encouraged to verbalized her needs to staff. Every 15 minutes check for safety. Patient contracts for safety.  R: Patient very receptive. Accepted her medications. Will continue to monitor patient.

## 2013-10-08 NOTE — Progress Notes (Signed)
Patient did attend the evening speaker AA meeting.  

## 2013-10-08 NOTE — [facility] Group Notes (Signed)
[facility] Group Notes: Healthy Support Groups  Date:  10/08/2013  Time:  0900  Type of Therapy:  Nurse Education  Participation Level:  Active  Participation Quality:  Appropriate  Affect:  Appropriate  Cognitive:  Appropriate  Insight:  Appropriate  Engagement in Group:  Engaged  Modes of Intervention:  Discussion  Summary of Progress/Problems:  Nicole CellaWebb, Tryphena Perkovich Guyes 10/08/2013, 10:41 AM

## 2013-10-08 NOTE — Progress Notes (Signed)
Patient ID: Joshua GrimesJames B Davila, male   DOB: [birth year], 33 y.o.   [id number] [facility]  10/08/2013 10:02 AM Joshua GrimesJames B Davila  MRN:  [id number]  Subjective:  Pt seen and chart reviewed. Pt denies SI, HI, and AVH, contracts for safety. Pt reports that he is feeling somewhat better, but that his nightmares are still bothering him. Pt reports that his anxiety is slightly elevated as well. Pt cites good appetite but poor sleep secondary to the nightmares; pt describes them as very vivid.    Diagnosis:   DSM5: Schizophrenia Disorders:  none Obsessive-Compulsive Disorders:  none Trauma-Stressor Disorders:  Posttraumatic Stress Disorder (309.81) Substance/Addictive Disorders:  Cocaine use disorder, Benzodiazepine use disorder Depressive Disorders:  Major Depressive Disorder - Moderate (296.22) Total Time spent with patient: 30 minutes  Axis I: Substance Induced Mood Disorder  ADL's:  Intact  Sleep: Poor due to nighmares  Appetite:  Fair  Suicidal Ideation:  Plan:  denies Intent:  denies Means:  denies Homicidal Ideation:  Plan:  denies Intent:  denies Means:  denies AEB (as evidenced by):  Psychiatric Specialty Exam: Physical Exam  Psychiatric: His speech is normal and behavior is normal. Judgment and thought content normal. His mood appears anxious. His affect is labile. Cognition and memory are normal. He exhibits a depressed mood.    Review of Systems  Constitutional: Positive for malaise/fatigue.  HENT: Negative.   Eyes: Negative.   Respiratory: Negative.   Cardiovascular: Negative.   Gastrointestinal: Negative.   Genitourinary: Negative.   Musculoskeletal: Negative.   Skin: Negative.   Neurological: Negative.   Endo/Heme/Allergies: Negative.   Psychiatric/Behavioral: Positive for depression and substance abuse. The patient is nervous/anxious.     Blood pressure 118/73, pulse 80, temperature 97.3 F (36.3 C), temperature source Oral, resp. rate 17, height 5'  10" (1.778 m), weight 81.647 kg (180 lb), SpO2 98.00%.Body mass index is 25.83 kg/(m^2).  General Appearance: Fairly Groomed  Patent attorneyye Contact::  Fair  Speech:  Clear and Coherent  Volume:  Normal  Mood:  Anxious, Depressed and worried, tearful  Affect:  anxious, worried  Thought Process:  Coherent and Goal Directed  Orientation:  Full (Time, Place, and Person)  Thought Content:  worries, concerns, continued nightmares  Suicidal Thoughts:  No  Homicidal Thoughts:  No  Memory:  Immediate;   Fair Recent;   Fair Remote;   Fair  Judgement:  Fair  Insight:  Present  Psychomotor Activity:  Restlessness  Concentration:  Fair  Recall:  [employer]Fair  Fund of Knowledge:NA  Language: Fair  Akathisia:  No  Handed:    AIMS (if indicated):     Assets:  Desire for Improvement Social Support  Sleep:  Number of Hours: 6.5   Musculoskeletal: Strength & Muscle Tone: within normal limits Gait & Station: normal Patient leans: N/A  Current Medications: Current Facility-Administered Medications  Medication Dose Route Frequency Provider Last Rate Last Dose  . acetaminophen (TYLENOL) tablet 650 mg  650 mg Oral Q6H PRN Rachael FeeIrving A Lugo, MD   650 mg at 10/03/13 1716  . alum & mag hydroxide-simeth (MAALOX/MYLANTA) 200-200-20 MG/5ML suspension 30 mL  30 mL Oral Q4H PRN Rachael FeeIrving A Lugo, MD      . ARIPiprazole (ABILIFY) tablet 10 mg  10 mg Oral Daily Rachael FeeIrving A Lugo, MD   10 mg at 10/08/13 0758  . aspirin EC tablet 81 mg  81 mg Oral Daily Rachael FeeIrving A Lugo, MD   81 mg at 10/08/13 0757  . atorvastatin (  LIPITOR) tablet 20 mg  20 mg Oral Daily Rachael Fee, MD   20 mg at 10/08/13 0758  . buPROPion (WELLBUTRIN XL) 24 hr tablet 150 mg  150 mg Oral Daily Rachael Fee, MD   150 mg at 10/08/13 0758  . carbamazepine (TEGRETOL XR) 12 hr tablet 200 mg  200 mg Oral BID Rachael Fee, MD   200 mg at 10/08/13 0758  . doxycycline (VIBRA-TABS) tablet 100 mg  100 mg Oral Q12H Rachael Fee, MD   100 mg at 10/08/13 0758  .  emtricitabine-tenofovir (TRUVADA) 200-300 MG per tablet 1 tablet  1 tablet Oral Daily Rachael Fee, MD   1 tablet at 10/08/13 0758  . estradiol (CLIMARA - Dosed in mg/24 hr) patch 0.05 mg  0.05 mg Transdermal Weekly Rachael Fee, MD   0.05 mg at 10/07/13 1007  . finasteride (PROSCAR) tablet 10 mg  10 mg Oral Daily Rachael Fee, MD   10 mg at 10/08/13 0757  . fluticasone (FLONASE) 50 MCG/ACT nasal spray 1 spray  1 spray Each Nare Daily Rachael Fee, MD   1 spray at 10/08/13 0759  . gabapentin (NEURONTIN) capsule 300 mg  300 mg Oral TID Rachael Fee, MD   300 mg at 10/08/13 0758  . hydrocortisone cream 1 %   Topical Daily Rachael Fee, MD      . magnesium hydroxide (MILK OF MAGNESIA) suspension 30 mL  30 mL Oral Daily PRN Rachael Fee, MD      . MINOXIDIL (TOPICAL) 5 % SOLN 1 application  1 application Topical BID Rachael Fee, MD   1 application at 10/07/13 [phone]  . multivitamin with minerals tablet 1 tablet  1 tablet Oral Daily Rachael Fee, MD   1 tablet at 10/08/13 0758  . nicotine polacrilex (NICORETTE) gum 2 mg  2 mg Oral PRN Rachael Fee, MD   2 mg at 10/06/13 0825  . OLANZapine zydis (ZYPREXA) disintegrating tablet 10 mg  10 mg Oral Q8H PRN Rachael Fee, MD   10 mg at 10/08/13 0848  . omega-3 acid ethyl esters (LOVAZA) capsule 2 g  2 g Oral Daily Rachael Fee, MD   2 g at 10/08/13 0757  . pantoprazole (PROTONIX) EC tablet 40 mg  40 mg Oral Daily Rachael Fee, MD   40 mg at 10/08/13 0757  . PARoxetine (PAXIL) tablet 20 mg  20 mg Oral Daily Rachael Fee, MD   20 mg at 10/08/13 0759  . prazosin (MINIPRESS) capsule 1 mg  1 mg Oral QHS Sanjuana Kava, NP   1 mg at 10/07/13 2136  . psyllium (HYDROCIL/METAMUCIL) packet 1 packet  1 packet Oral BID Rachael Fee, MD   1 packet at 10/07/13 2136  . senna-docusate (Senokot-S) tablet 1 tablet  1 tablet Oral Daily Rachael Fee, MD   1 tablet at 10/08/13 0758  . thiamine (VITAMIN B-1) tablet 100 mg  100 mg Oral Daily Rachael Fee, MD   100 mg  at 10/08/13 8295    Lab Results: No results found for this or any previous visit (from the past 48 hour(s)).  Physical Findings: AIMS: Facial and Oral Movements Muscles of Facial Expression: None, normal Lips and Perioral Area: None, normal Jaw: None, normal Tongue: None, normal,Extremity Movements Upper (arms, wrists, hands, fingers): None, normal Lower (legs, knees, ankles, toes): None, normal, Trunk Movements Neck, shoulders, hips: None, normal, Overall Severity  Severity of abnormal movements (highest score from questions above): None, normal Incapacitation due to abnormal movements: None, normal Patient's awareness of abnormal movements (rate only patient's report): No Awareness, Dental Status Current problems with teeth and/or dentures?: No Does patient usually wear dentures?: No  CIWA:  CIWA-Ar Total: 0 COWS:     Treatment Plan Summary: Daily contact with patient to assess and evaluate symptoms and progress in treatment Medication management  Plan: Supportive approach/coping skills/relapse prevention Complete detox Monitor SE and response to the Tegretol Continue Wellbutrin XL 150 mg in AM CBT;mindfulness. Increase Minipress 2 mg Q bedtime for nightmares which still persist.   Medical Decision Making Problem Points:  Established problem, stable/improving (1) and Review of psycho-social stressors (1) Data Points:  Review of medication regiment & side effects (2) Review of new medications or change in dosage (2)  I certify that inpatient services furnished can reasonably be expected to improve the patient's condition.   Beau Fanny, FNP-BC 10/08/2013, 10:02 AM

## 2013-10-08 NOTE — [facility] Group Notes (Signed)
[facility] Group Notes:  (Clinical Social Work)  10/08/2013  10:00-11:00AM  Summary of Progress/Problems:   The main focus of today's process group was to   identify the patient's current support system and decide on other supports that can be put in place.  The picture on workbook was used to discuss why additional supports are needed.  An emphasis was placed on using counselor, doctor, therapy groups, 12-step groups, and problem-specific support groups to expand supports.   There was also an extensive discussion about what constitutes a healthy support versus an unhealthy support.  The patient was very drowsy throughout group.  He shared that his telephone is his most unhealthy support.  Type of Therapy:  Process Group with Motivational Interviewing  Participation Level:  Minimal  Participation Quality:  Drowsy  Affect:  Blunted  Cognitive:  Oriented  Insight:  Developing/Improving  Engagement in Therapy:  Improving  Modes of Intervention:   Education, Support and Processing, Activity  Joshua MantleMareida Grossman-Orr, LCSW 10/08/2013, 12:15pm

## 2013-10-08 NOTE — [facility] Group Notes (Signed)
[facility] Group Notes:  Goals group  Date:  10/08/2013  Time:  2:01 PM  Type of Therapy:  Nurse Education  Participation Level:  Active  Participation Quality:  Appropriate  Affect:  Appropriate  Cognitive:  Appropriate  Insight:  Appropriate  Engagement in Group:  Engaged  Modes of Intervention:  Discussion  Summary of Progress/Problems:pt stated he wanted to worship christ and listen to his sermon. Pt wants to read the bible more.   Rodman KeyWebb, Luma Clopper [facility] LtdGuyes 10/08/2013, 2:01 PM

## 2013-10-08 NOTE — Progress Notes (Addendum)
Pt states she slept better last pm but still ad some nightmares. Pt stated depression and hopelessness are a 0/10 today. Pt would like the lord to do his work . Pt would like to talk to staff one on one to discuss how to deal with his mother and his fiancee. He does contract for safety and denies SI and HI. Pt was given zyprexa at 9am for nerves. She has been attending groups. Pt c/o right heel pain but refused an ice pack. No difficulty weight bearing or ambulating. Pt stated he is very hungry now and wanted to know if this was a side effect of the medication.

## 2013-10-08 NOTE — Progress Notes (Signed)
Adult Activity Group Note  Date: 10/08/13 Time: 3:00PM  Group Topic: Wellness Jeopardy  Participation Level: Did not attend  Activity: Patients participated in jeopardy like game, answering questions relating to wellness in categories such as Fruits, Fitness, Veggies, Exercise, and Producer, television/film/videoood Safety.  Additional Comments: Pt was asleep during group.  Joshua Davila, Joshua Davila C 4:15 PM

## 2013-10-09 DIAGNOSIS — F1920 Other psychoactive substance dependence, uncomplicated: Secondary | ICD-10-CM

## 2013-10-09 DIAGNOSIS — F4310 Post-traumatic stress disorder, unspecified: Secondary | ICD-10-CM

## 2013-10-09 MED ORDER — PRAZOSIN HCL 2 MG PO CAPS
2.0000 mg | ORAL_CAPSULE | Freq: Every day | ORAL | Status: DC
  Filled 2013-10-09 (×2): qty 1

## 2013-10-09 MED ORDER — FLUTICASONE PROPIONATE 50 MCG/ACT NA SUSP: 1.0000 | Freq: Every day | NASAL | Status: DC

## 2013-10-09 MED ORDER — PANTOPRAZOLE SODIUM 40 MG PO TBEC: 40.0000 mg | DELAYED_RELEASE_TABLET | Freq: Every day | ORAL | Status: DC

## 2013-10-09 MED ORDER — ESTRADIOL 0.05 MG/24HR TD PTTW: 1.0000 | MEDICATED_PATCH | Freq: Every day | TRANSDERMAL | Status: DC

## 2013-10-09 MED ORDER — HYDROXYZINE HCL 25 MG PO TABS: 25.0000 mg | ORAL_TABLET | Freq: Four times a day (QID) | ORAL | Status: DC | PRN

## 2013-10-09 MED ORDER — CARBAMAZEPINE ER 200 MG PO TB12: 200.0000 mg | ORAL_TABLET | Freq: Two times a day (BID) | ORAL | Status: DC

## 2013-10-09 MED ORDER — ATORVASTATIN CALCIUM 40 MG PO TABS: 20.0000 mg | ORAL_TABLET | Freq: Every day | ORAL | Status: DC

## 2013-10-09 MED ORDER — BUPROPION HCL ER (XL) 150 MG PO TB24: 150.0000 mg | ORAL_TABLET | Freq: Every day | ORAL | Status: DC

## 2013-10-09 MED ORDER — MINOXIDIL 5 % EX SOLN: 1.0000 "application " | Freq: Two times a day (BID) | CUTANEOUS | Status: DC

## 2013-10-09 MED ORDER — HYDROXYZINE HCL 25 MG PO TABS
25.0000 mg | ORAL_TABLET | Freq: Three times a day (TID) | ORAL | Status: DC | PRN
  Filled 2013-10-09: qty 42

## 2013-10-09 MED ORDER — EMTRICITABINE-TENOFOVIR DF 200-300 MG PO TABS: 1.0000 | ORAL_TABLET | Freq: Every day | ORAL | Status: DC

## 2013-10-09 MED ORDER — DOXYCYCLINE HYCLATE 100 MG PO TABS: 100.0000 mg | ORAL_TABLET | Freq: Two times a day (BID) | ORAL | Status: DC

## 2013-10-09 MED ORDER — ASPIRIN EC 81 MG PO TBEC: 81.0000 mg | DELAYED_RELEASE_TABLET | Freq: Every day | ORAL | Status: DC

## 2013-10-09 MED ORDER — GABAPENTIN 300 MG PO CAPS: 300.0000 mg | ORAL_CAPSULE | Freq: Three times a day (TID) | ORAL | Status: DC

## 2013-10-09 MED ORDER — ARIPIPRAZOLE 10 MG PO TABS: 10.0000 mg | ORAL_TABLET | Freq: Every day | ORAL | Status: DC

## 2013-10-09 MED ORDER — HYDROXYZINE HCL 25 MG PO TABS: ORAL_TABLET | ORAL | Status: DC

## 2013-10-09 MED ORDER — PRAZOSIN HCL 2 MG PO CAPS: 2.0000 mg | ORAL_CAPSULE | Freq: Every day | ORAL | Status: DC

## 2013-10-09 MED ORDER — SENNOSIDES-DOCUSATE SODIUM 8.6-50 MG PO TABS: 1.0000 | ORAL_TABLET | Freq: Every day | ORAL | Status: DC

## 2013-10-09 MED ORDER — FINASTERIDE 5 MG PO TABS: 10.0000 mg | ORAL_TABLET | Freq: Every day | ORAL | Status: DC

## 2013-10-09 MED ORDER — PAROXETINE HCL 20 MG PO TABS: 20.0000 mg | ORAL_TABLET | Freq: Every day | ORAL | Status: DC

## 2013-10-09 MED ORDER — HYDROCORTISONE 2.5 % EX CREA: 1.0000 "application " | TOPICAL_CREAM | Freq: Every day | CUTANEOUS | Status: DC

## 2013-10-09 MED ORDER — FISH OIL 1000 MG PO CAPS: 3000.0000 mg | ORAL_CAPSULE | Freq: Every day | ORAL | Status: DC

## 2013-10-09 MED ORDER — PSYLLIUM HUSK POWD
15.0000 mL | Freq: Two times a day (BID) | Status: DC
Start: 2013-10-09 — End: 2015-03-04

## 2013-10-09 NOTE — Progress Notes (Signed)
Adult Psychoeducational Group Note  Date:  10/09/2013 Time:  1:18 PM  Group Topic/Focus:  Self Care:   The focus of this group is to help patients understand the importance of self-care in order to improve or restore emotional, physical, spiritual, interpersonal, and financial health.  Participation Level:  Did Not Attend  Additional Comments:  Patient was informed of group and encouraged to attend.  Coolidge Breezeichols, Fritzie Prioleau Roger 10/09/2013, 1:18 PM

## 2013-10-09 NOTE — Discharge Summary (Signed)
Physician Discharge Summary Note  Patient:  Joshua Davila is an 33 y.o., male MRN:  [id number] DOB:  19-Dec-1980 Patient phone:  [phone] (home)  Patient address:   [address] [place] [address],  Total Time spent with patient: Greater than 30 minutes  Date of Admission:  10/02/2013 Date of Discharge: 10/09/13  Reason for Admission: Drug detox/mood stabilization  Discharge Diagnoses: Active Problems:   Polysubstance dependence   PTSD (post-traumatic stress disorder)   Substance induced mood disorder   Benzodiazepine dependence   Cocaine dependence   Opioid abuse with opioid-induced mood disorder   Psychiatric Specialty Exam: Physical Exam  Psychiatric: His behavior is normal. Judgment normal. His mood appears not anxious. His affect is not angry, not blunt, not labile and not inappropriate. Cognition and memory are normal. He does not exhibit a depressed mood.    Review of Systems  Constitutional: Negative.   HENT: Negative.   Eyes: Negative.   Respiratory: Negative.   Cardiovascular: Negative.   Gastrointestinal: Negative.   Genitourinary: Negative.   Musculoskeletal: Negative.   Skin: Negative.   Neurological: Negative.   Endo/Heme/Allergies: Negative.   Psychiatric/Behavioral: Positive for depression (Stable) and substance abuse (Hx Polysubstance dependence). Negative for suicidal ideas, hallucinations and memory loss. The patient has insomnia (Stable). The patient is not nervous/anxious.     Blood pressure 134/90, pulse 89, temperature 97.8 F (36.6 C), temperature source Oral, resp. rate 16, height 5\' 10"  (1.778 m), weight 81.647 kg (180 lb), SpO2 98.00%.Body mass index is 25.83 kg/(m^2).  General Appearance: Fairly Groomed  Patent attorney:: Fair  Speech: Clear and Coherent  Volume: Normal  Mood: Anxious  Affect: anxious  Thought Process: Coherent and Goal Directed  Orientation: Full (Time, Place, and Person)  Thought Content: plans as she moves on,  relapse prevention plan  Suicidal Thoughts: No  Homicidal Thoughts: No Memory: Immediate; Fair  Recent; Fair  Remote; Fair  Judgement: Fair  Insight: Present  Psychomotor Activity: Normal  Concentration: Fair  Recall: [employer] of Knowledge:NA  Language: Fair  Akathisia: No  Handed:  AIMS (if indicated): Assets: Desire for Improvement  Housing  Social Support  Sleep: Number of Hours: 4.25   Past Psychiatric History: Diagnosis: Polysubstance dependence, Substance induced mood disordr, PTSD  Hospitalizations: [facility] adult unit  Outpatient Care: [facility]  Substance Abuse Care: [facility]  Self-Mutilation: NA  Suicidal Attempts: NA  Violent Behaviors: NA   Musculoskeletal: Strength & Muscle Tone: within normal limits Gait & Station: normal Patient leans: N/A  DSM5: Schizophrenia Disorders:  NA Obsessive-Compulsive Disorders:  NA Trauma-Stressor Disorders:  Posttraumatic Stress Disorder (309.81) Substance/Addictive Disorders:  Polysubstance dependence Depressive Disorders:  Substance induced mood disorder  Axis Diagnosis:  AXIS I:  Polysubstance dependence, Substance induced mood disordr, PTSD AXIS II:  Deferred AXIS III:   Past Medical History  Diagnosis Date  . Schizoaffective disorder   . PTSD (post-traumatic stress disorder)   . Depression   . Liver disorder    AXIS IV:  other psychosocial or environmental problems and polysubstance dependence AXIS V:  63  Level of Care:  OP  Hospital Course:  33 Y/O trans gender male who admits to multiple drugs use. States that she has been using Xanax up to 8 mg daily for the last 3 weeks. Used to take 6 mg twice a week. States that she uses crack cocaine regularly, during this last week has used three to four times. States that he uses opioids to take when he does not find  other drugs. Binges. States he hardly drinks uses a half a bottle of wine in a week. Has a history of abusing [place] Salts for what he was sent to [facility] and then  to the [place]. She is a veteran 100 % service connected suffers from PTSD. Has gotten to a point he cant continue to do what she is doing.  Joshua Davila) as he liked to be addressed was admitted to the hospital with his UDS tests reports showing positive Benzodiazepine, Cocaine and THC. He admitted abusing and binging on multiple substances including alcohol. He required drug detox as well as mood stabilization. Joshua Davila) received Librium detox protocols. He also was medicated and discharged on Abilify 10 mg Q bedtime for mood control, Wellbutrin XL 150 mg daily for depression, Tegretol XR 200 mg twice daily for mood stabilization, Neurontin 300 mg three times daily for substance withdrawal syndrome, Hydroxyzine 25 mg three times daily as needed for anxiety and Paroxetine 20 mg daily for depression. Joshua Davila) was also resumed on all her pertinent home medications for her other medical issues. She is a transgender male who is on estrogen therapy for male gender transition.   Asher Davila also enrolled and participated in the group counseling sessions and AA/NA meetings being offered and held on this unit. She learned coping skills. Asher Davila has completed detox treatment and her mood is stable. She is currently being discharged to continue psychiatric care and substance abuse treatment at the [facility] in [place], [place]. She is provided with all the pertinent information required to make this appointment without problems. Upon discharge, she adamantly denies any SIHI, AVH, delusional thoughts, paranoia and or withdrawal symptoms. She received from the [name] pharmacy, a 14 days worth, supply samples of her [facility] discharge medications. She left [facility] with all belongings in no apparent distress. Transportation per partner.  Consults:  psychiatry  Significant Diagnostic Studies:  labs: CBC with diff, CMP, UDS, toxicology tests, U/A  Discharge Vitals:   Blood pressure 134/90, pulse 89, temperature 97.8 F (36.6 C),  temperature source Oral, resp. rate 16, height 5\' 10"  (1.778 m), weight 81.647 kg (180 lb), SpO2 98.00%. Body mass index is 25.83 kg/(m^2). Lab Results:   No results found for this or any previous visit (from the past 72 hour(s)).  Physical Findings: AIMS: Facial and Oral Movements Muscles of Facial Expression: None, normal Lips and Perioral Area: None, normal Jaw: None, normal Tongue: None, normal,Extremity Movements Upper (arms, wrists, hands, fingers): None, normal Lower (legs, knees, ankles, toes): None, normal, Trunk Movements Neck, shoulders, hips: None, normal, Overall Severity Severity of abnormal movements (highest score from questions above): None, normal Incapacitation due to abnormal movements: None, normal Patient's awareness of abnormal movements (rate only patient's report): No Awareness, Dental Status Current problems with teeth and/or dentures?: No Does patient usually wear dentures?: No  CIWA:  CIWA-Ar Total: 0 COWS:     Psychiatric Specialty Exam: See Psychiatric Specialty Exam and Suicide Risk Assessment completed by Attending Physician prior to discharge.  Discharge destination:  Home  Is patient on multiple antipsychotic therapies at discharge:  No   Has Patient had three or more failed trials of antipsychotic monotherapy by history:  No  Recommended Plan for Multiple Antipsychotic Therapies: NA    Medication List    STOP taking these medications       doxycycline 100 MG capsule  Commonly known as:  MONODOX      TAKE these medications     Indication   ARIPiprazole  10 MG tablet  Commonly known as:  ABILIFY  Take 1 tablet (10 mg total) by mouth daily. For mood control   Indication:  Mood control     aspirin EC 81 MG tablet  Take 1 tablet (81 mg total) by mouth daily. For heart health   Indication:  Heart health     atorvastatin 40 MG tablet  Commonly known as:  LIPITOR  Take 0.5 tablets (20 mg total) by mouth daily. For high cholesterol    Indication:  Inherited Heterozygous Hypercholesterolemia     buPROPion 150 MG 24 hr tablet  Commonly known as:  WELLBUTRIN XL  Take 1 tablet (150 mg total) by mouth daily. For depression   Indication:  Major Depressive Disorder     carbamazepine 200 MG 12 hr tablet  Commonly known as:  TEGRETOL XR  Take 1 tablet (200 mg total) by mouth 2 (two) times daily. For mood stabilization   Indication:  Mood stabilization     doxycycline 100 MG tablet  Commonly known as:  VIBRA-TABS  Take 1 tablet (100 mg total) by mouth every 12 (twelve) hours. For infection   Indication:  Infection     emtricitabine-tenofovir 200-300 MG per tablet  Commonly known as:  TRUVADA  Take 1 tablet by mouth daily. For pre-exposure to HIV   Indication:  Pre-Exposure Prophylaxis of HIV     estradiol 0.05 MG/24HR patch  Commonly known as:  ALORA  Place 1 patch (0.05 mg total) onto the skin daily. For male hormone replacement   Indication:  Deficiency of the Hormone Estrogen     finasteride 5 MG tablet  Commonly known as:  PROSCAR  Take 2 tablets (10 mg total) by mouth daily. For enlarged prostate   Indication:  Enlarged Prostate with Urination Problems     Fish Oil 1000 MG Caps  Take 3 capsules (3,000 mg total) by mouth daily. For high cholesterol   Indication:  High Amount of Cholesterol in the Blood, High cholesterol     fluticasone 50 MCG/ACT nasal spray  Commonly known as:  FLONASE  Place 1 spray into both nostrils daily. For allergies   Indication:  Perennial Rhinitis, Hayfever     gabapentin 300 MG capsule  Commonly known as:  NEURONTIN  Take 1 capsule (300 mg total) by mouth 3 (three) times daily. For substance withdrawal syndrome/pain   Indication:  Substance withdrawal syndrome     hydrocortisone 2.5 % cream  Apply 1 application topically daily. For itching   Indication:  Itching     hydrOXYzine 25 MG tablet  Commonly known as:  ATARAX/VISTARIL  Take 25 mg three times daily as needed: For  anxiety   Indication:  Tension, Anxiety     MINOXIDIL (TOPICAL) 5 % Soln  Commonly known as:  MINOXIDIL FOR MEN  Apply 1 application topically 2 (two) times daily. For thinning hair   Indication:  Male Pattern Baldness     pantoprazole 40 MG tablet  Commonly known as:  PROTONIX  Take 1 tablet (40 mg total) by mouth daily. For acid reflux   Indication:  Gastroesophageal Reflux Disease     PARoxetine 20 MG tablet  Commonly known as:  PAXIL  Take 1 tablet (20 mg total) by mouth daily. For depression   Indication:  Major Depressive Disorder     prazosin 2 MG capsule  Commonly known as:  MINIPRESS  Take 1 capsule (2 mg total) by mouth at bedtime. For nightmares   Indication:  PTSD related nightmares     Psyllium Husk Powd  Take 15 mLs by mouth 2 (two) times daily. Mix in juice or water and drink: for constipation   Indication:  Constipation     senna-docusate 8.6-50 MG per tablet  Commonly known as:  SENNA S  Take 1 tablet by mouth daily. For constipation   Indication:  Constipation       Follow-up Information   Follow up with [facility]  On 10/10/2013. (Admission scheduled for Tuesday morning. Contact Linda for any questions/details. )    Contact information:   ATTN: Cephas Darby [address] [place], [place] [address] Phone: [phone] ext [phone] Fax: [phone]     Follow-up recommendations:  Activity:  As tolerated Diet: As recommended by your primary care doctor. Keep all scheduled follow-up appointments as recommended.  Comments:  Take all your medications as prescribed by your mental healthcare provider. Report any adverse effects and or reactions from your medicines to your outpatient provider promptly. Patient is instructed and cautioned to not engage in alcohol and or illegal drug use while on prescription medicines. In the event of worsening symptoms, patient is instructed to call the crisis hotline, 911 and or go to the nearest ED for appropriate evaluation and  treatment of symptoms. Follow-up with your primary care provider for your other medical issues, concerns and or health care needs.  Total Discharge Time:  Greater than 30 minutes.  Signed: Sanjuana Kava, PMHNP 10/10/2013, 10:41 AM I personally assessed the patient and formulated the plan Madie Reno A. Dub Mikes, M.D.

## 2013-10-09 NOTE — [facility] Group Notes (Signed)
[facility]   10/09/2013 10:12 AM  Participation Quality:       Appropriate   Mood/Affect:  Appropriate  Depression Rating:  1  Anxiety Rating:  1  Thoughts of Suicide:  No Will you contract for safety?   NA  Current AVH:  No  Plan for Discharge/Comments:   Pt is dcing today to home until admission into [facility] (possibly tomorrow). [place]. (phone interview scheduled at 10am this morning).   Transportation Means: boyfriend, Mellody DanceKeith  Supports: boyfriend, some family supports   Counselling psychologistmart, OncologistHeather LCSWA

## 2013-10-09 NOTE — [facility] Suicide Risk Assessment (Signed)
Suicide Risk Assessment  Discharge Assessment     Demographic Factors:  Caucasian  Total Time spent with patient: 45 minutes  Psychiatric Specialty Exam:     Blood pressure 134/90, pulse 89, temperature 97.8 F (36.6 C), temperature source Oral, resp. rate 16, height 5\' 10"  (1.778 m), weight 81.647 kg (180 lb), SpO2 98.00%.Body mass index is 25.83 kg/(m^2).  General Appearance: Fairly Groomed  Patent attorneyye Contact::  Fair  Speech:  Clear and Coherent  Volume:  Normal  Mood:  Anxious  Affect:  anxious  Thought Process:  Coherent and Goal Directed  Orientation:  Full (Time, Place, and Person)  Thought Content:  plans as she moves on, relapse prevention plan  Suicidal Thoughts:  No  Homicidal Thoughts:  No  Memory:  Immediate;   Fair Recent;   Fair Remote;   Fair  Judgement:  Fair  Insight:  Present  Psychomotor Activity:  Normal  Concentration:  Fair  Recall:  [employer]  Fund of Knowledge:NA  Language: Fair  Akathisia:  No  Handed:    AIMS (if indicated):     Assets:  Desire for Improvement Housing Social Support  Sleep:  Number of Hours: 4.25    Musculoskeletal: Strength & Muscle Tone: within normal limits Gait & Station: normal Patient leans: N/A   Mental Status Per Nursing Assessment::   On Admission:     Current Mental Status by Physician: In full contact with reality. There are no active S/S of withdrawal. States she feels ready to move on. [facility] States she is ready to be D/C today and take care of certain things she needs to do and then be ready for the long term program   Loss Factors: NA  Historical Factors: NA  Risk Reduction Factors:   Living with another person, especially a relative, Positive social support and Positive therapeutic relationship  Continued Clinical Symptoms:  Alcohol/Substance Abuse/Dependencies  Cognitive Features That Contribute To Risk:  Closed-mindedness Polarized thinking Thought constriction  (tunnel vision)    Suicide Risk:  Minimal: No identifiable suicidal ideation.  Patients presenting with no risk factors but with morbid ruminations; may be classified as minimal risk based on the severity of the depressive symptoms  Discharge Diagnoses:   AXIS I:  Benzodiazepine Dependence, Cocaine Dependence, PTSD, Mood Disorder NOS, Substance Induced Mood Disorder AXIS II:  No diagnosis AXIS III:   Past Medical History  Diagnosis Date  . Schizoaffective disorder   . PTSD (post-traumatic stress disorder)   . Depression   . Liver disorder    AXIS IV:  other psychosocial or environmental problems AXIS V:  51-60 moderate symptoms  Plan Of Care/Follow-up recommendations:  Activity:  as tolerated Diet:  regular [facility] Is patient on multiple antipsychotic therapies at discharge:  No   Has Patient had three or more failed trials of antipsychotic monotherapy by history:  No  Recommended Plan for Multiple Antipsychotic Therapies: NA    Suzann Lazaro A 10/09/2013, 12:12 PM

## 2013-10-09 NOTE — Progress Notes (Signed)
[facility] :  Will you be returning to the same living situation after discharge: No. Pt going to [facility] at d/c. At discharge, do you have transportation home?:Yes,  partner,  Do you have the ability to pay for your medications:Yes, full VA benefits  Release of information consent forms completed and submitted to Medical Records by CSW.  Patient to Follow up at: Follow-up Information   Follow up with [facility] . (Contact Cephas DarbyLinda Richards (phone interview) on day of discharge to schedule admission (possibly for Tuesday 10/10/13). )    Contact information:   ATTN: Cephas DarbyLinda Richards [address] [place], [place] [id number] Phone: [phone] ext [phone] Fax: [phone]      Patient denies SI/HI:   Yes,  during group/self report.  During admission.    Safety Planning and Suicide Prevention discussed:  Yes,  Pt denied SI during admission [facility]. Pt provided with SPI pamphlet and encouraged to share information with support network, ask questions, and talk about any concerns relating to SPE.  Smart, Jamauri Kruzel LCSWA  10/09/2013, 11:41 AM

## 2013-10-09 NOTE — Progress Notes (Signed)
Patient ID: Joshua GrimesJames B Paule, male   DOB: March 01, 1981, 33 y.o.   MRN: [id number] She has been discharged and was picked up by her friend.  She voiced understanding of discharge instruction and of the follow up plan. She denies thoughts of SI and spoke of how nice [facility] was. All belongings were taken home with her.

## 2013-10-09 NOTE — Progress Notes (Signed)
Patient ID: Joshua Davila, male   DOB: [birth year], 33 y.o.   MRN: [id number] D)  Pt is resting quietly at this time, eyes closed, resp reg, unlabored, no c/o's voiced. A)  Will continue to monitor for safety per q 15 minute checks. R)  Safety maintained at this time.

## 2013-10-12 NOTE — Progress Notes (Signed)
Patient Discharge Instructions:  After Visit Summary (AVS):   Faxed to:  10/12/13 Discharge Summary Note:   Faxed to:  10/12/13 Psychiatric Admission Assessment Note:   Faxed to:  10/12/13 Suicide Risk Assessment - Discharge Assessment:   Faxed to:  10/12/13 Faxed/Sent to the Next Level Care provider:  10/12/13 [facility] @ [phone]  Jerelene ReddenSheena E Braselton, 10/12/2013, 2:19 PM

## 2014-02-09 ENCOUNTER — Emergency Department (HOSPITAL_COMMUNITY)
Admission: EM | Admit: 2014-02-09 | Discharge: 2014-02-09 | Discharge status: Against Medical Advice | Payer: BC Managed Care – PPO | Attending: Emergency Medicine | Admitting: Emergency Medicine

## 2014-02-09 ENCOUNTER — Telehealth (INDEPENDENT_AMBULATORY_CARE_PROVIDER_SITE_OTHER): Payer: Self-pay

## 2014-02-09 ENCOUNTER — Telehealth (INDEPENDENT_AMBULATORY_CARE_PROVIDER_SITE_OTHER): Payer: Self-pay | Admitting: Surgery

## 2014-02-09 ENCOUNTER — Encounter (HOSPITAL_COMMUNITY): Payer: Self-pay | Admitting: Family Medicine

## 2014-02-09 DIAGNOSIS — Z720 Tobacco use: Secondary | ICD-10-CM | POA: Insufficient documentation

## 2014-02-09 DIAGNOSIS — Z7952 Long term (current) use of systemic steroids: Secondary | ICD-10-CM | POA: Insufficient documentation

## 2014-02-09 DIAGNOSIS — Z79899 Other long term (current) drug therapy: Secondary | ICD-10-CM | POA: Insufficient documentation

## 2014-02-09 DIAGNOSIS — F4310 Post-traumatic stress disorder, unspecified: Secondary | ICD-10-CM | POA: Insufficient documentation

## 2014-02-09 DIAGNOSIS — K6289 Other specified diseases of anus and rectum: Secondary | ICD-10-CM | POA: Diagnosis present

## 2014-02-09 DIAGNOSIS — F329 Major depressive disorder, single episode, unspecified: Secondary | ICD-10-CM | POA: Insufficient documentation

## 2014-02-09 DIAGNOSIS — F259 Schizoaffective disorder, unspecified: Secondary | ICD-10-CM | POA: Insufficient documentation

## 2014-02-09 DIAGNOSIS — R52 Pain, unspecified: Secondary | ICD-10-CM

## 2014-02-09 DIAGNOSIS — Z880 Allergy status to penicillin: Secondary | ICD-10-CM | POA: Insufficient documentation

## 2014-02-09 DIAGNOSIS — G8918 Other acute postprocedural pain: Secondary | ICD-10-CM | POA: Diagnosis not present

## 2014-02-09 DIAGNOSIS — Z792 Long term (current) use of antibiotics: Secondary | ICD-10-CM | POA: Insufficient documentation

## 2014-02-09 DIAGNOSIS — Z7951 Long term (current) use of inhaled steroids: Secondary | ICD-10-CM | POA: Diagnosis not present

## 2014-02-09 DIAGNOSIS — Z7982 Long term (current) use of aspirin: Secondary | ICD-10-CM | POA: Diagnosis not present

## 2014-02-09 LAB — CBC
HCT: 36.4 % — ABNORMAL LOW (ref 39.0–52.0)
Hemoglobin: 12.5 g/dL — ABNORMAL LOW (ref 13.0–17.0)
MCH: 30.5 pg (ref 26.0–34.0)
MCHC: 34.3 g/dL (ref 30.0–36.0)
MCV: 88.8 fL (ref 78.0–100.0)
PLATELETS: 146 10*3/uL — AB (ref 150–400)
RBC: 4.1 MIL/uL — ABNORMAL LOW (ref 4.22–5.81)
RDW: 13.7 % (ref 11.5–15.5)
WBC: 8.4 10*3/uL (ref 4.0–10.5)

## 2014-02-09 LAB — COMPREHENSIVE METABOLIC PANEL
ALT: 35 U/L (ref 0–53)
AST: 30 U/L (ref 0–37)
Albumin: 4 g/dL (ref 3.5–5.2)
Alkaline Phosphatase: 52 U/L (ref 39–117)
Anion gap: 16 — ABNORMAL HIGH (ref 5–15)
BUN: 12 mg/dL (ref 6–23)
CO2: 20 mEq/L (ref 19–32)
Calcium: 9.2 mg/dL (ref 8.4–10.5)
Chloride: 104 mEq/L (ref 96–112)
Creatinine, Ser: 0.99 mg/dL (ref 0.50–1.35)
GFR calc Af Amer: >90 mL/min (ref >90)
GFR calc non Af Amer: >90 mL/min (ref >90)
Glucose, Bld: 114 mg/dL — ABNORMAL HIGH (ref 70–99)
Potassium: 4 mEq/L (ref 3.7–5.3)
SODIUM: 140 meq/L (ref 137–147)
TOTAL PROTEIN: 6.9 g/dL (ref 6.0–8.3)
Total Bilirubin: 0.4 mg/dL (ref 0.3–1.2)

## 2014-02-09 NOTE — ED Notes (Signed)
RN called to the room by patient, "I am leaving". Dr [name] made aware and confirmed [facility]. IV removed, pt noted to dress himself, ambulate to discharge independently. Prior pt was yelling and moaning. There was no yelling or moaning noted at time of discharge.

## 2014-02-09 NOTE — Telephone Encounter (Signed)
Pt called to state he is going to the ER for pain not controlled with Percocet, ibuprofen, and warm baths.

## 2014-02-09 NOTE — ED Provider Notes (Signed)
CSN: [id number]     Arrival date & time 02/09/14  1133 History   First MD Initiated Contact with Patient 02/09/14 1447     Chief Complaint  Patient presents with  . Rectal Pain       HPI  Patient presents complaining of rectal pain. His 24 hours status post hemorrhoidectomy with central [place] surgery. He complained that he had severe rectal pain all night despite Percocet, and ibuprofen. He states that he did one sitz bath.  I have read and reviewed Dr. Michaell CowingGross' note in the computer further advising him regarding changing medications and further sitz baths. He presents to the emergency room.  Past Medical History  Diagnosis Date  . Schizoaffective disorder   . PTSD (post-traumatic stress disorder)   . Depression   . Liver disorder    History reviewed. No pertinent past surgical history. History reviewed. No pertinent family history. History  Substance Use Topics  . Smoking status: Current Every Day Smoker -- 1.00 packs/day    Types: Cigarettes  . Smokeless tobacco: Not on file  . Alcohol Use: Yes     Comment: occasional    Review of Systems  Constitutional: Negative for fever, chills, diaphoresis, appetite change and fatigue.  HENT: Negative for mouth sores, sore throat and trouble swallowing.   Eyes: Negative for visual disturbance.  Respiratory: Negative for cough, chest tightness, shortness of breath and wheezing.   Cardiovascular: Negative for chest pain.  Gastrointestinal: Negative for nausea, vomiting, abdominal pain, diarrhea and abdominal distention.  Endocrine: Negative for polydipsia, polyphagia and polyuria.  Genitourinary: Negative for dysuria, frequency and hematuria.  Musculoskeletal: Negative for gait problem.  Skin: Negative for color change, pallor and rash.  Neurological: Negative for dizziness, syncope, light-headedness and headaches.  Hematological: Does not bruise/bleed easily.  Psychiatric/Behavioral: Negative for behavioral problems and  confusion.      Allergies  Penicillins and Sulfa antibiotics  Home Medications   Prior to Admission medications   Medication Sig Start Date End Date Taking? Authorizing Provider  aspirin EC 81 MG tablet Take 1 tablet (81 mg total) by mouth daily. For heart health 10/09/13  Yes Sanjuana KavaAgnes I Nwoko, NP  atorvastatin (LIPITOR) 40 MG tablet Take 0.5 tablets (20 mg total) by mouth daily. For high cholesterol 10/09/13  Yes Sanjuana KavaAgnes I Nwoko, NP  buPROPion (WELLBUTRIN XL) 150 MG 24 hr tablet Take 1 tablet (150 mg total) by mouth daily. For depression 10/09/13  Yes Sanjuana KavaAgnes I Nwoko, NP  doxycycline (VIBRA-TABS) 100 MG tablet Take 1 tablet (100 mg total) by mouth every 12 (twelve) hours. For infection 10/09/13  Yes Sanjuana KavaAgnes I Nwoko, NP  emtricitabine-tenofovir (TRUVADA) 200-300 MG per tablet Take 1 tablet by mouth daily. For pre-exposure to HIV 10/09/13  Yes Sanjuana KavaAgnes I Nwoko, NP  estradiol (ALORA) 0.05 MG/24HR patch Place 1 patch (0.05 mg total) onto the skin daily. For male hormone replacement 10/09/13  Yes Sanjuana KavaAgnes I Nwoko, NP  finasteride (PROSCAR) 5 MG tablet Take 2 tablets (10 mg total) by mouth daily. For enlarged prostate 10/09/13  Yes Sanjuana KavaAgnes I Nwoko, NP  fluticasone (FLONASE) 50 MCG/ACT nasal spray Place 1 spray into both nostrils daily. For allergies 10/09/13  Yes Sanjuana KavaAgnes I Nwoko, NP  gabapentin (NEURONTIN) 300 MG capsule Take 1 capsule (300 mg total) by mouth 3 (three) times daily. For substance withdrawal syndrome/pain 10/09/13  Yes Sanjuana KavaAgnes I Nwoko, NP  hydrocortisone 2.5 % cream Apply 1 application topically daily. For itching 10/09/13  Yes Sanjuana KavaAgnes I Nwoko, NP  MINOXIDIL, TOPICAL, (  MINOXIDIL FOR MEN) 5 % SOLN Apply 1 application topically 2 (two) times daily. For thinning hair 10/09/13  Yes Sanjuana Kava, NP  Omega-3 Fatty Acids (FISH OIL) 1000 MG CAPS Take 3 capsules (3,000 mg total) by mouth daily. For high cholesterol 10/09/13  Yes Sanjuana Kava, NP  oxyCODONE-acetaminophen (PERCOCET/ROXICET) 5-325 MG per tablet Take 1  tablet by mouth every 4 (four) hours as needed for severe pain.   Yes Historical Provider, MD  pantoprazole (PROTONIX) 40 MG tablet Take 1 tablet (40 mg total) by mouth daily. For acid reflux 10/09/13  Yes Sanjuana Kava, NP  PARoxetine (PAXIL) 20 MG tablet Take 1 tablet (20 mg total) by mouth daily. For depression 10/09/13  Yes Sanjuana Kava, NP  Psyllium Husk POWD Take 15 mLs by mouth 2 (two) times daily. Mix in juice or water and drink: for constipation 10/09/13  Yes Sanjuana Kava, NP  senna-docusate (SENNA S) 8.6-50 MG per tablet Take 1 tablet by mouth daily. For constipation 10/09/13  Yes Sanjuana Kava, NP  ARIPiprazole (ABILIFY) 10 MG tablet Take 1 tablet (10 mg total) by mouth daily. For mood control 10/09/13   Sanjuana Kava, NP  carbamazepine (TEGRETOL XR) 200 MG 12 hr tablet Take 1 tablet (200 mg total) by mouth 2 (two) times daily. For mood stabilization Patient not taking: Reported on 02/09/2014 10/09/13   Sanjuana Kava, NP  hydrOXYzine (ATARAX/VISTARIL) 25 MG tablet Take 25 mg three times daily as needed: For anxiety Patient not taking: Reported on 02/09/2014 10/09/13   Sanjuana Kava, NP  prazosin (MINIPRESS) 2 MG capsule Take 1 capsule (2 mg total) by mouth at bedtime. For nightmares Patient not taking: Reported on 02/09/2014 10/09/13   Sanjuana Kava, NP   BP 109/70 mmHg  Pulse 84  Temp(Src) 97.7 F (36.5 C) (Oral)  Resp 20  Ht 5\' 11"  (1.803 m)  Wt 192 lb (87.091 kg)  BMI 26.79 kg/m2  SpO2 100% Physical Exam  Constitutional: He is oriented to person, place, and time. He appears well-developed and well-nourished. No distress.  HENT:  Head: Normocephalic.  Eyes: Conjunctivae are normal. Pupils are equal, round, and reactive to light. No scleral icterus.  Neck: Normal range of motion. Neck supple. No thyromegaly present.  Cardiovascular: Normal rate and regular rhythm.  Exam reveals no gallop and no friction rub.   No murmur heard. Pulmonary/Chest: Effort normal and breath sounds  normal. No respiratory distress. He has no wheezes. He has no rales.  Abdominal: Soft. Bowel sounds are normal. He exhibits no distension. There is no tenderness. There is no rebound.  Genitourinary:     Musculoskeletal: Normal range of motion.  Neurological: He is alert and oriented to person, place, and time.  Skin: Skin is warm and dry. No rash noted.  Psychiatric: He has a normal mood and affect. His behavior is normal.    ED Course  Procedures (including critical care time) Labs Review Labs Reviewed  CBC - Abnormal; Notable for the following:    RBC 4.10 (*)    Hemoglobin 12.5 (*)    HCT 36.4 (*)    Platelets 146 (*)    All other components within normal limits  COMPREHENSIVE METABOLIC PANEL    Imaging Review No results found.   EKG Interpretation None      MDM   Final diagnoses:  Postoperative pain  Pain    Pt immediately agitated with my history and exam.  Discussed with him some  additional IV fluids to ensure that his blood pressure was adequate for IV pain medications. He essentially ignored this part of the conversation. He repeatedly wanted to know "Dude, what am I going to get?". I told him I would give him IV pain medication. However, I told him that this may not adequately control his pain as he had likely developed dependence with his daily narcotic use. Told him that it is standard practice to offer anti-inflammatories, sitz baths, and Percocet for the first several days following a hemorrhoidectomy. I see no sign of complications.  He was immediately dissatisfied with this answer. He requested to "have all of the ship taken off of me". He asked to be discharged. I think he is an appropriate state of mind to do so. Pressure is 118/75. Heart rate 79. CK elevated out of the department with non-antalgic gait in no apparent discomfort.    Rolland PorterMark Ralphie, MD 02/09/14 [phone]

## 2014-02-09 NOTE — ED Notes (Signed)
Pt sts he had rectal surgery yesterday and is having severe pain. sts has taken 2 percocet and not helping.

## 2014-02-09 NOTE — Discharge Instructions (Signed)
Pt left before DC instructions could be given.  Advised prior to his departure to continue current post-operative care per his surgeon.

## 2014-02-09 NOTE — Telephone Encounter (Signed)
Mr. Joshua Davila called to ask advice regarding pain control after hemorrhoid surgery yesterday.  They picked up the pain medication yesterday, Percocet 5/325 with directions to toke one or two every 4-6 hours PRN pain.  He had hoped to control pain on Ibuprofen only to prevent constipation, but pt was screaming in pain last night.  They called the physician on call who advised giving the pain medication on schedule, increase warm baths, and try Naproxen sodium instead of ibuprofen. Joshua Davila voiced concern that naproxen would not be as effective for inflammation.  Nurse informed him that Naproxen has an anti-inflammatory effect with longer duration of action than ibuprofen.  He confirmed that they are using a stool softener, Metamucil.  Joshua Davila verbalized understanding.  SL

## 2014-02-09 NOTE — Telephone Encounter (Signed)
Joshua Davila  [birth year] [id number]  Patient Care Team: No Pcp Per Patient as PCP - General (General Practice)  This patient is a 33 y.o.male who calls today for surgical evaluation.   Date of procedure/visit: 02/08/2014  [facility]  Reason for call: Pain  Patient status post hemorrhoid surgery.  Called and middle night with concerns that Percocet ibuprofen and warm soaks were not adequate.  Only soaked once.  No nausea or vomiting.  No severe bleeding.  I recommended doing scheduled Percocet.  Increase ibuprofen dose.   Consider switching to naproxen from ibuprofen.  Increase frequency of soaks.  Unfortunately a challenge in this patient with polysubstance abuse and narcotic dependence.  I noted if pain was not adequately controlled, could go to emergency room for more aggressive treatment.  Otherwise can try to increase / adjust tension pain medications.  Expressed understanding.  Patient Active Problem List   Diagnosis Date Noted  . Benzodiazepine dependence 10/04/2013  . Cocaine dependence 10/04/2013  . Opioid abuse with opioid-induced mood disorder 10/04/2013  . PTSD (post-traumatic stress disorder) 10/03/2013  . Substance induced mood disorder 10/03/2013  . Polysubstance dependence 10/02/2013    Past Medical History  Diagnosis Date  . Schizoaffective disorder   . PTSD (post-traumatic stress disorder)   . Depression   . Liver disorder     No past surgical history on file.  History   Social History  . Marital Status: Single    Spouse Name: N/A    Number of Children: N/A  . Years of Education: N/A   Occupational History  . Not on file.   Social History Main Topics  . Smoking status: Current Every Day Smoker -- 1.00 packs/day    Types: Cigarettes  . Smokeless tobacco: Not on file  . Alcohol Use: Yes     Comment: occasional  . Drug Use: Yes    Special: Cocaine, Methamphetamines, Marijuana     Comment: Opiates  . Sexual Activity:  Not on file   Other Topics Concern  . Not on file   Social History Narrative  . No narrative on file    No family history on file.  Current Outpatient Prescriptions  Medication Sig Dispense Refill  . ARIPiprazole (ABILIFY) 10 MG tablet Take 1 tablet (10 mg total) by mouth daily. For mood control 30 tablet 0  . aspirin EC 81 MG tablet Take 1 tablet (81 mg total) by mouth daily. For heart health    . atorvastatin (LIPITOR) 40 MG tablet Take 0.5 tablets (20 mg total) by mouth daily. For high cholesterol    . buPROPion (WELLBUTRIN XL) 150 MG 24 hr tablet Take 1 tablet (150 mg total) by mouth daily. For depression 30 tablet 0  . carbamazepine (TEGRETOL XR) 200 MG 12 hr tablet Take 1 tablet (200 mg total) by mouth 2 (two) times daily. For mood stabilization 60 tablet 0  . doxycycline (VIBRA-TABS) 100 MG tablet Take 1 tablet (100 mg total) by mouth every 12 (twelve) hours. For infection    . emtricitabine-tenofovir (TRUVADA) 200-300 MG per tablet Take 1 tablet by mouth daily. For pre-exposure to HIV    . estradiol (ALORA) 0.05 MG/24HR patch Place 1 patch (0.05 mg total) onto the skin daily. For male hormone replacement 8 patch 12  . finasteride (PROSCAR) 5 MG tablet Take 2 tablets (10 mg total) by mouth daily. For enlarged prostate    . fluticasone (FLONASE) 50 MCG/ACT nasal spray Place 1 spray into both nostrils  daily. For allergies  2  . gabapentin (NEURONTIN) 300 MG capsule Take 1 capsule (300 mg total) by mouth 3 (three) times daily. For substance withdrawal syndrome/pain 90 capsule 0  . hydrocortisone 2.5 % cream Apply 1 application topically daily. For itching 30 g 0  . hydrOXYzine (ATARAX/VISTARIL) 25 MG tablet Take 25 mg three times daily as needed: For anxiety 30 tablet 0  . MINOXIDIL, TOPICAL, (MINOXIDIL FOR MEN) 5 % SOLN Apply 1 application topically 2 (two) times daily. For thinning hair  0  . Omega-3 Fatty Acids (FISH OIL) 1000 MG CAPS Take 3 capsules (3,000 mg total) by mouth  daily. For high cholesterol  0  . pantoprazole (PROTONIX) 40 MG tablet Take 1 tablet (40 mg total) by mouth daily. For acid reflux    . PARoxetine (PAXIL) 20 MG tablet Take 1 tablet (20 mg total) by mouth daily. For depression 30 tablet 0  . prazosin (MINIPRESS) 2 MG capsule Take 1 capsule (2 mg total) by mouth at bedtime. For nightmares 30 capsule 0  . Psyllium Husk POWD Take 15 mLs by mouth 2 (two) times daily. Mix in juice or water and drink: for constipation  0  . senna-docusate (SENNA S) 8.6-50 MG per tablet Take 1 tablet by mouth daily. For constipation     No current facility-administered medications for this visit.     Allergies  Allergen Reactions  . Penicillins Rash  . Sulfa Antibiotics Rash    @VS @  No results found.  Note: This dictation was prepared with Dragon/digital dictation along with [employer] technology. Any transcriptional errors that result from this process are unintentional.

## 2014-02-14 ENCOUNTER — Emergency Department (INDEPENDENT_AMBULATORY_CARE_PROVIDER_SITE_OTHER)
Admission: EM | Admit: 2014-02-14 | Discharge: 2014-02-14 | Disposition: A | Discharge status: Home / Self Care | Payer: Self-pay | Source: Home / Self Care

## 2014-02-14 ENCOUNTER — Encounter (HOSPITAL_COMMUNITY): Payer: Self-pay | Admitting: Emergency Medicine

## 2014-02-14 ENCOUNTER — Emergency Department (HOSPITAL_COMMUNITY)
Admission: EM | Admit: 2014-02-14 | Discharge: 2014-02-14 | Discharge status: Absent without leave | Payer: Self-pay | Source: Home / Self Care

## 2014-02-14 DIAGNOSIS — L03211 Cellulitis of face: Secondary | ICD-10-CM

## 2014-02-14 MED ORDER — MINOCYCLINE HCL 100 MG PO CAPS: 100.0000 mg | ORAL_CAPSULE | Freq: Two times a day (BID) | ORAL | Status: DC

## 2014-02-14 MED ORDER — HYDROXYZINE HCL 25 MG PO TABS: 25.0000 mg | ORAL_TABLET | Freq: Four times a day (QID) | ORAL | Status: DC

## 2014-02-14 MED ORDER — MUPIROCIN CALCIUM 2 % EX CREA: 1.0000 "application " | TOPICAL_CREAM | Freq: Two times a day (BID) | CUTANEOUS | Status: DC

## 2014-02-14 NOTE — Discharge Instructions (Signed)
Use dial or zest soap, take medicine as prescribed, see your doctor as needed.

## 2014-02-14 NOTE — ED Provider Notes (Signed)
CSN: [id number]     Arrival date & time 02/14/14  1907 History   None    Chief Complaint  Patient presents with  . Rash   (Consider location/radiation/quality/duration/timing/severity/associated sxs/prior Treatment) Patient is a 33 y.o. male presenting with rash. The history is provided by the patient and a caregiver.  Rash Location:  Face and leg Facial rash location:  Face Leg rash location:  L upper leg and L lower leg Quality: itchiness, painful and redness   Quality comment:  Excoriations of skin as noted. Severity:  Moderate Onset quality:  Sudden Duration:  2 days Progression:  Worsening Relieved by:  None tried Worsened by:  Nothing tried Ineffective treatments:  None tried Associated symptoms: no fever     Past Medical History  Diagnosis Date  . Schizoaffective disorder   . PTSD (post-traumatic stress disorder)   . Depression   . Liver disorder    History reviewed. No pertinent past surgical history. No family history on file. History  Substance Use Topics  . Smoking status: Current Every Day Smoker -- 1.00 packs/day    Types: Cigarettes  . Smokeless tobacco: Not on file  . Alcohol Use: Yes     Comment: occasional    Review of Systems  Constitutional: Negative.  Negative for fever.  Skin: Positive for rash and wound.    Allergies  Penicillins and Sulfa antibiotics  Home Medications   Prior to Admission medications   Medication Sig Start Date End Date Taking? Authorizing Provider  oxyCODONE-acetaminophen (PERCOCET/ROXICET) 5-325 MG per tablet Take 1 tablet by mouth every 4 (four) hours as needed for severe pain.   Yes Historical Provider, MD  ARIPiprazole (ABILIFY) 10 MG tablet Take 1 tablet (10 mg total) by mouth daily. For mood control 10/09/13   Sanjuana KavaAgnes I Nwoko, NP  aspirin EC 81 MG tablet Take 1 tablet (81 mg total) by mouth daily. For heart health 10/09/13   Sanjuana KavaAgnes I Nwoko, NP  atorvastatin (LIPITOR) 40 MG tablet Take 0.5 tablets (20 mg total) by  mouth daily. For high cholesterol 10/09/13   Sanjuana KavaAgnes I Nwoko, NP  buPROPion (WELLBUTRIN XL) 150 MG 24 hr tablet Take 1 tablet (150 mg total) by mouth daily. For depression 10/09/13   Sanjuana KavaAgnes I Nwoko, NP  carbamazepine (TEGRETOL XR) 200 MG 12 hr tablet Take 1 tablet (200 mg total) by mouth 2 (two) times daily. For mood stabilization Patient not taking: Reported on 02/09/2014 10/09/13   Sanjuana KavaAgnes I Nwoko, NP  doxycycline (VIBRA-TABS) 100 MG tablet Take 1 tablet (100 mg total) by mouth every 12 (twelve) hours. For infection 10/09/13   Sanjuana KavaAgnes I Nwoko, NP  emtricitabine-tenofovir (TRUVADA) 200-300 MG per tablet Take 1 tablet by mouth daily. For pre-exposure to HIV 10/09/13   Sanjuana KavaAgnes I Nwoko, NP  estradiol (ALORA) 0.05 MG/24HR patch Place 1 patch (0.05 mg total) onto the skin daily. For male hormone replacement 10/09/13   Sanjuana KavaAgnes I Nwoko, NP  finasteride (PROSCAR) 5 MG tablet Take 2 tablets (10 mg total) by mouth daily. For enlarged prostate 10/09/13   Sanjuana KavaAgnes I Nwoko, NP  fluticasone [facility](FLONASE) 50 MCG/ACT nasal spray Place 1 spray into both nostrils daily. For allergies 10/09/13   Sanjuana KavaAgnes I Nwoko, NP  gabapentin (NEURONTIN) 300 MG capsule Take 1 capsule (300 mg total) by mouth 3 (three) times daily. For substance withdrawal syndrome/pain 10/09/13   Sanjuana KavaAgnes I Nwoko, NP  hydrocortisone 2.5 % cream Apply 1 application topically daily. For itching 10/09/13   Sanjuana KavaAgnes I Nwoko, NP  hydrOXYzine (ATARAX/VISTARIL) 25 MG tablet Take 25 mg three times daily as needed: For anxiety Patient not taking: Reported on 02/09/2014 10/09/13   Sanjuana KavaAgnes I Nwoko, NP  hydrOXYzine (ATARAX/VISTARIL) 25 MG tablet Take 1 tablet (25 mg total) by mouth every 6 (six) hours. Prn itching 02/14/14   Linna HoffJames D Syann Cupples, MD  minocycline (MINOCIN,DYNACIN) 100 MG capsule Take 1 capsule (100 mg total) by mouth 2 (two) times daily. 02/14/14   Linna HoffJames D Cailee Blanke, MD  MINOXIDIL, TOPICAL, (MINOXIDIL FOR MEN) 5 % SOLN Apply 1 application topically 2 (two) times daily. For thinning hair 10/09/13    Sanjuana KavaAgnes I Nwoko, NP  mupirocin cream (BACTROBAN) 2 % Apply 1 application topically 2 (two) times daily. 02/14/14   Linna HoffJames D Deicy Rusk, MD  Omega-3 Fatty Acids (FISH OIL) 1000 MG CAPS Take 3 capsules (3,000 mg total) by mouth daily. For high cholesterol 10/09/13   Sanjuana KavaAgnes I Nwoko, NP  pantoprazole (PROTONIX) 40 MG tablet Take 1 tablet (40 mg total) by mouth daily. For acid reflux 10/09/13   Sanjuana KavaAgnes I Nwoko, NP  PARoxetine (PAXIL) 20 MG tablet Take 1 tablet (20 mg total) by mouth daily. For depression 10/09/13   Sanjuana KavaAgnes I Nwoko, NP  prazosin (MINIPRESS) 2 MG capsule Take 1 capsule (2 mg total) by mouth at bedtime. For nightmares Patient not taking: Reported on 02/09/2014 10/09/13   Sanjuana KavaAgnes I Nwoko, NP  Psyllium Husk POWD Take 15 mLs by mouth 2 (two) times daily. Mix in juice or water and drink: for constipation 10/09/13   Sanjuana KavaAgnes I Nwoko, NP  senna-docusate (SENNA S) 8.6-50 MG per tablet Take 1 tablet by mouth daily. For constipation 10/09/13   Sanjuana KavaAgnes I Nwoko, NP   BP 86/58 mmHg  Pulse 95  Temp(Src) 98.5 F (36.9 C) (Oral)  Resp 16  SpO2 97% Physical Exam  Constitutional: He is oriented to person, place, and time. He appears well-developed and well-nourished.  Neck: Normal range of motion. Neck supple.  Lymphadenopathy:    He has no cervical adenopathy.  Neurological: He is alert and oriented to person, place, and time.  Skin: Skin is warm and dry. Rash noted.  Excoriations on face and ext from scratching.  Nursing note and vitals reviewed.   ED Course  Procedures (including critical care time) Labs Review Labs Reviewed - No data to display  Imaging Review No results found.   MDM   1. Cellulitis diffuse, face        Linna HoffJames D Deshane Cotroneo, MD 02/14/14 [phone]

## 2014-02-14 NOTE — ED Notes (Signed)
Reports he was picking at his scabs and now have become infected Taking oxycodone and valium due to a hemorrhoid surgery.  Alert, no signs of acute distress.
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
## 2014-02-18 ENCOUNTER — Other Ambulatory Visit (INDEPENDENT_AMBULATORY_CARE_PROVIDER_SITE_OTHER): Payer: Self-pay | Admitting: General Surgery

## 2014-02-18 DIAGNOSIS — K6289 Other specified diseases of anus and rectum: Secondary | ICD-10-CM

## 2014-02-18 MED ORDER — TRAMADOL HCL 50 MG PO TABS: 50.0000 mg | ORAL_TABLET | Freq: Four times a day (QID) | ORAL | Status: DC | PRN

## 2014-06-15 ENCOUNTER — Emergency Department (HOSPITAL_COMMUNITY)
Admission: EM | Admit: 2014-06-15 | Discharge: 2014-06-16 | Disposition: A | Discharge status: Home / Self Care | Payer: Self-pay | Attending: Emergency Medicine | Admitting: Emergency Medicine

## 2014-06-15 ENCOUNTER — Encounter (HOSPITAL_COMMUNITY): Payer: Self-pay | Admitting: Emergency Medicine

## 2014-06-15 ENCOUNTER — Emergency Department (HOSPITAL_COMMUNITY): Payer: Self-pay

## 2014-06-15 DIAGNOSIS — Z880 Allergy status to penicillin: Secondary | ICD-10-CM | POA: Insufficient documentation

## 2014-06-15 DIAGNOSIS — F329 Major depressive disorder, single episode, unspecified: Secondary | ICD-10-CM | POA: Insufficient documentation

## 2014-06-15 DIAGNOSIS — Z7952 Long term (current) use of systemic steroids: Secondary | ICD-10-CM | POA: Insufficient documentation

## 2014-06-15 DIAGNOSIS — Z8719 Personal history of other diseases of the digestive system: Secondary | ICD-10-CM | POA: Insufficient documentation

## 2014-06-15 DIAGNOSIS — Z7951 Long term (current) use of inhaled steroids: Secondary | ICD-10-CM | POA: Insufficient documentation

## 2014-06-15 DIAGNOSIS — Y9389 Activity, other specified: Secondary | ICD-10-CM | POA: Insufficient documentation

## 2014-06-15 DIAGNOSIS — Z79899 Other long term (current) drug therapy: Secondary | ICD-10-CM | POA: Insufficient documentation

## 2014-06-15 DIAGNOSIS — Z793 Long term (current) use of hormonal contraceptives: Secondary | ICD-10-CM | POA: Insufficient documentation

## 2014-06-15 DIAGNOSIS — F419 Anxiety disorder, unspecified: Secondary | ICD-10-CM | POA: Insufficient documentation

## 2014-06-15 DIAGNOSIS — S5001XA Contusion of right elbow, initial encounter: Secondary | ICD-10-CM | POA: Insufficient documentation

## 2014-06-15 DIAGNOSIS — F4310 Post-traumatic stress disorder, unspecified: Secondary | ICD-10-CM | POA: Insufficient documentation

## 2014-06-15 DIAGNOSIS — Y998 Other external cause status: Secondary | ICD-10-CM | POA: Insufficient documentation

## 2014-06-15 DIAGNOSIS — Z7982 Long term (current) use of aspirin: Secondary | ICD-10-CM | POA: Insufficient documentation

## 2014-06-15 DIAGNOSIS — Y9289 Other specified places as the place of occurrence of the external cause: Secondary | ICD-10-CM | POA: Insufficient documentation

## 2014-06-15 DIAGNOSIS — Z720 Tobacco use: Secondary | ICD-10-CM | POA: Insufficient documentation

## 2014-06-15 DIAGNOSIS — S51011A Laceration without foreign body of right elbow, initial encounter: Secondary | ICD-10-CM | POA: Insufficient documentation

## 2014-06-15 DIAGNOSIS — F259 Schizoaffective disorder, unspecified: Secondary | ICD-10-CM | POA: Insufficient documentation

## 2014-06-15 DIAGNOSIS — Z792 Long term (current) use of antibiotics: Secondary | ICD-10-CM | POA: Insufficient documentation

## 2014-06-15 NOTE — ED Notes (Signed)
[employer] in to talk with patient at this time. Made second attempt to call social worker
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
## 2014-06-15 NOTE — ED Notes (Signed)
Social worker in to talk with patient at this time

## 2014-06-15 NOTE — Progress Notes (Signed)
EDCM spoke to patient and his mother at bedside.  Patient confirms he does not have a pcp or insurance.  Patient's mother [facility] [place] [facility].  Patient cannot remember the name of his doctor.  System updated.  [facility], informed patient of services there and walk in times.  EDCM also provided patient with list of pcps who accept self pay patients, list of discount pharmacies and websites needymeds.org and GoodRX.com for medication assistance, phone number to inquire about the orange card, phone number to inquire about Mediciad, phone number to inquire about the Affordable Care Act, financial resources in the community such as local churches, [employer], urban ministries, and dental assistance for uninsured patients.  Patient thankfulf or resources.  No further EDCM needs at this time.
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
## 2014-06-16 MED ORDER — BACITRACIN ZINC 500 UNIT/GM EX OINT: 1.0000 "application " | TOPICAL_OINTMENT | Freq: Two times a day (BID) | CUTANEOUS | Status: DC

## 2014-06-16 MED ORDER — LIDOCAINE-EPINEPHRINE (PF) 2 %-1:200000 IJ SOLN
10.0000 mL | Freq: Once | INTRAMUSCULAR | Status: AC
  Administered 2014-06-16: 10 mL
  Filled 2014-06-16: qty 10

## 2014-06-16 MED ORDER — NAPROXEN 500 MG PO TABS
500.0000 mg | ORAL_TABLET | Freq: Once | ORAL | Status: AC
  Administered 2014-06-16: 500 mg via ORAL
  Filled 2014-06-16: qty 1

## 2014-06-16 MED ORDER — LORAZEPAM 1 MG PO TABS
1.0000 mg | ORAL_TABLET | Freq: Once | ORAL | Status: AC
  Administered 2014-06-16: 1 mg via ORAL
  Filled 2014-06-16: qty 1

## 2014-06-16 MED ORDER — IBUPROFEN 600 MG PO TABS
600.0000 mg | ORAL_TABLET | Freq: Four times a day (QID) | ORAL | Status: DC | PRN
Start: 2014-06-16 — End: 2014-12-17

## 2014-06-16 MED ORDER — BACITRACIN ZINC 500 UNIT/GM EX OINT
1.0000 "application " | TOPICAL_OINTMENT | Freq: Two times a day (BID) | CUTANEOUS | Status: DC
  Administered 2014-06-16: 1 via TOPICAL
  Filled 2014-06-16: qty 0.9

## 2014-06-16 NOTE — Discharge Instructions (Signed)
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
## 2014-12-16 ENCOUNTER — Encounter (HOSPITAL_COMMUNITY): Payer: Self-pay

## 2014-12-16 ENCOUNTER — Emergency Department (HOSPITAL_COMMUNITY): Payer: Self-pay

## 2014-12-16 ENCOUNTER — Emergency Department (HOSPITAL_COMMUNITY)
Admission: EM | Admit: 2014-12-16 | Discharge: 2014-12-17 | Disposition: A | Discharge status: Home / Self Care | Payer: Self-pay | Attending: Emergency Medicine | Admitting: Emergency Medicine

## 2014-12-16 DIAGNOSIS — Z720 Tobacco use: Secondary | ICD-10-CM | POA: Insufficient documentation

## 2014-12-16 DIAGNOSIS — Z79899 Other long term (current) drug therapy: Secondary | ICD-10-CM | POA: Insufficient documentation

## 2014-12-16 DIAGNOSIS — Y998 Other external cause status: Secondary | ICD-10-CM | POA: Insufficient documentation

## 2014-12-16 DIAGNOSIS — F4310 Post-traumatic stress disorder, unspecified: Secondary | ICD-10-CM | POA: Insufficient documentation

## 2014-12-16 DIAGNOSIS — Y9289 Other specified places as the place of occurrence of the external cause: Secondary | ICD-10-CM | POA: Insufficient documentation

## 2014-12-16 DIAGNOSIS — Z880 Allergy status to penicillin: Secondary | ICD-10-CM | POA: Insufficient documentation

## 2014-12-16 DIAGNOSIS — Z8719 Personal history of other diseases of the digestive system: Secondary | ICD-10-CM | POA: Insufficient documentation

## 2014-12-16 DIAGNOSIS — T148XXA Other injury of unspecified body region, initial encounter: Secondary | ICD-10-CM

## 2014-12-16 DIAGNOSIS — F329 Major depressive disorder, single episode, unspecified: Secondary | ICD-10-CM | POA: Insufficient documentation

## 2014-12-16 DIAGNOSIS — S61451A Open bite of right hand, initial encounter: Secondary | ICD-10-CM | POA: Insufficient documentation

## 2014-12-16 DIAGNOSIS — Z7951 Long term (current) use of inhaled steroids: Secondary | ICD-10-CM | POA: Insufficient documentation

## 2014-12-16 DIAGNOSIS — F259 Schizoaffective disorder, unspecified: Secondary | ICD-10-CM | POA: Insufficient documentation

## 2014-12-16 DIAGNOSIS — Z7982 Long term (current) use of aspirin: Secondary | ICD-10-CM | POA: Insufficient documentation

## 2014-12-16 DIAGNOSIS — Z792 Long term (current) use of antibiotics: Secondary | ICD-10-CM | POA: Insufficient documentation

## 2014-12-16 DIAGNOSIS — Y9389 Activity, other specified: Secondary | ICD-10-CM | POA: Insufficient documentation

## 2014-12-16 DIAGNOSIS — W540XXA Bitten by dog, initial encounter: Secondary | ICD-10-CM | POA: Insufficient documentation

## 2014-12-16 MED ORDER — CLINDAMYCIN PHOSPHATE 600 MG/50ML IV SOLN
600.0000 mg | Freq: Once | INTRAVENOUS | Status: AC
  Administered 2014-12-17: 600 mg via INTRAVENOUS
  Filled 2014-12-16: qty 50

## 2014-12-16 MED ORDER — CIPROFLOXACIN IN D5W 400 MG/200ML IV SOLN
400.0000 mg | Freq: Once | INTRAVENOUS | Status: AC
  Administered 2014-12-17: 400 mg via INTRAVENOUS
  Filled 2014-12-16: qty 200

## 2014-12-16 MED ORDER — LIDOCAINE HCL 2 % IJ SOLN
10.0000 mL | Freq: Once | INTRAMUSCULAR | Status: AC
  Administered 2014-12-17: 200 mg
  Filled 2014-12-16: qty 20

## 2014-12-16 MED ORDER — HYDROMORPHONE HCL 1 MG/ML IJ SOLN
1.0000 mg | Freq: Once | INTRAMUSCULAR | Status: AC
  Administered 2014-12-17: 1 mg via INTRAVENOUS
  Filled 2014-12-16: qty 1

## 2014-12-16 MED ORDER — OXYCODONE-ACETAMINOPHEN 5-325 MG PO TABS
2.0000 | ORAL_TABLET | Freq: Once | ORAL | Status: AC
  Administered 2014-12-16: 2 via ORAL
  Filled 2014-12-16: qty 2

## 2014-12-16 MED ORDER — ONDANSETRON HCL 4 MG/2ML IJ SOLN
4.0000 mg | Freq: Once | INTRAMUSCULAR | Status: AC
  Administered 2014-12-17: 4 mg via INTRAVENOUS
  Filled 2014-12-16: qty 2

## 2014-12-16 MED ORDER — DOXYCYCLINE HYCLATE 100 MG PO TABS
100.0000 mg | ORAL_TABLET | Freq: Once | ORAL | Status: AC
  Administered 2014-12-16: 100 mg via ORAL
  Filled 2014-12-16: qty 1

## 2014-12-16 NOTE — ED Provider Notes (Signed)
CSN: [id number]     Arrival date & time 12/16/14  2211 History  This chart was scribed for non-physician provider Teressa Lower, NP-C, working with Leta Baptist, MD by Phillis Haggis, ED Scribe. This patient was seen in room TR05C/TR05C and patient care was started at 10:37 PM.   Chief Complaint  Patient presents with  . Animal Bite   The history is provided by the patient. No language interpreter was used.  HPI Comments: Joshua Davila is a 34 y.o. male who presents to the Emergency Department complaining of a dog bite to right hand onset earlier this evening. Pt states that his Pit Bull bit his hand while the pt was trying to break up a dog fight. Pt reports that the hand has sharp pain, burning, and going numb; arrived in room with hand wrapped and bleeding controlled. States that the dog is not UTD on rabies vaccine. Reports that he is UTD on tdap. Reports allergies to penicillins and sulfa antibiotics; will break out in hives.   Past Medical History  Diagnosis Date  . Schizoaffective disorder   . PTSD (post-traumatic stress disorder)   . Depression   . Liver disorder    History reviewed. No pertinent past surgical history. No family history on file. Social History  Substance Use Topics  . Smoking status: Current Every Day Smoker -- 1.00 packs/day    Types: Cigarettes  . Smokeless tobacco: None  . Alcohol Use: Yes     Comment: occasional    Review of Systems  Skin: Positive for wound.  All other systems reviewed and are negative.     Allergies  Penicillins and Sulfa antibiotics  Home Medications   Prior to Admission medications   Medication Sig Start Date End Date Taking? Authorizing Provider  ARIPiprazole (ABILIFY) 10 MG tablet Take 1 tablet (10 mg total) by mouth daily. For mood control 10/09/13   Sanjuana Kava, NP  aspirin EC 81 MG tablet Take 1 tablet (81 mg total) by mouth daily. For heart health 10/09/13   Sanjuana Kava, NP  atorvastatin (LIPITOR) 40 MG  tablet Take 0.5 tablets (20 mg total) by mouth daily. For high cholesterol 10/09/13   Sanjuana Kava, NP  bacitracin ointment Apply 1 application topically 2 (two) times daily. 06/16/14   Antony Madura, PA-C  buPROPion (WELLBUTRIN XL) 150 MG 24 hr tablet Take 1 tablet (150 mg total) by mouth daily. For depression 10/09/13   Sanjuana Kava, NP  carbamazepine (TEGRETOL XR) 200 MG 12 hr tablet Take 1 tablet (200 mg total) by mouth 2 (two) times daily. For mood stabilization Patient not taking: Reported on 02/09/2014 10/09/13   Sanjuana Kava, NP  doxycycline (VIBRA-TABS) 100 MG tablet Take 1 tablet (100 mg total) by mouth every 12 (twelve) hours. For infection Patient not taking: Reported on 06/15/2014 10/09/13   Sanjuana Kava, NP  emtricitabine-tenofovir (TRUVADA) 200-300 MG per tablet Take 1 tablet by mouth daily. 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 For male hormone replacement 10/09/13   Sanjuana Kava, NP  finasteride (PROSCAR) 5 MG tablet Take 2 tablets (10 mg total) by mouth daily. For enlarged prostate 10/09/13   Sanjuana Kava, NP  fluticasone [facility]) 50 MCG/ACT nasal spray Place 1 spray into both nostrils daily. For allergies Patient not taking: Reported on 06/15/2014 10/09/13   Sanjuana Kava,  NP  gabapentin (NEURONTIN) 300 MG capsule Take 1 capsule (300 mg total) by mouth 3 (three) times daily. For substance withdrawal syndrome/pain Patient taking differently: Take 900 mg by mouth 3 (three) times daily. For substance withdrawal syndrome/pain 10/09/13   Sanjuana Kava, NP  hydrocortisone 2.5 % cream Apply 1 application topically daily. For itching 10/09/13   Sanjuana Kava, NP  hydrOXYzine (ATARAX/VISTARIL) 25 MG tablet Take 25 mg three times daily as needed: For anxiety Patient not taking: Reported on 02/09/2014 10/09/13   Sanjuana Kava, NP  hydrOXYzine (ATARAX/VISTARIL) 25 MG tablet Take 1 tablet (25 mg  total) by mouth every 6 (six) hours. Prn itching Patient not taking: Reported on 06/15/2014 02/14/14   Linna Hoff, MD  ibuprofen (ADVIL,MOTRIN) 600 MG tablet Take 1 tablet (600 mg total) by mouth every 6 (six) hours as needed. 06/16/14   Antony Madura, PA-C  minocycline (MINOCIN,DYNACIN) 100 MG capsule Take 1 capsule (100 mg total) by mouth 2 (two) times daily. Patient not taking: Reported on 06/15/2014 02/14/14   Linna Hoff, MD  MINOXIDIL, TOPICAL, (MINOXIDIL FOR MEN) 5 % SOLN Apply 1 application topically 2 (two) times daily. For thinning hair 10/09/13   Sanjuana Kava, NP  mupirocin cream (BACTROBAN) 2 % Apply 1 application topically 2 (two) times daily. Patient not taking: Reported on 06/15/2014 02/14/14   Linna Hoff, MD  Omega-3 Fatty Acids (FISH OIL) 1000 MG CAPS Take 3 capsules (3,000 mg total) by mouth daily. For high cholesterol 10/09/13   Sanjuana Kava, NP  pantoprazole (PROTONIX) 40 MG tablet Take 1 tablet (40 mg total) by mouth daily. For acid reflux 10/09/13   Sanjuana Kava, NP  PARoxetine (PAXIL) 20 MG tablet Take 1 tablet (20 mg total) by mouth daily. For depression 10/09/13   Sanjuana Kava, NP  prazosin (MINIPRESS) 2 MG capsule Take 1 capsule (2 mg total) by mouth at bedtime. For nightmares Patient not taking: Reported on 02/09/2014 10/09/13   Sanjuana Kava, NP  Psyllium Husk POWD Take 15 mLs by mouth 2 (two) times daily. Mix in juice or water and drink: for constipation Patient not taking: Reported on 06/15/2014 10/09/13   Sanjuana Kava, NP  senna-docusate (SENNA S) 8.6-50 MG per tablet Take 1 tablet by mouth daily. For constipation Patient not taking: Reported on 06/15/2014 10/09/13   Sanjuana Kava, NP  traMADol (ULTRAM) 50 MG tablet Take 1-2 tablets (50-100 mg total) by mouth every 6 (six) hours as needed. Patient not taking: Reported on 06/15/2014 02/18/14   Romie Levee, MD   BP 116/67 mmHg  Pulse 121  Temp(Src) 97.5 F (36.4 C) (Oral)  Resp 16  Ht  (1.803 m)  Wt 189 lb  6 oz (85.9 kg)  BMI 26.42 kg/m2  SpO2 96% Physical Exam  Constitutional: He is oriented to person, place, and time. He appears well-developed and well-nourished.  HENT:  Head: Normocephalic and atraumatic.  Right Ear: External ear normal.  Left Ear: External ear normal.  Eyes: EOM are normal.  Neck: Normal range of motion. Neck supple.  Cardiovascular: Normal rate.   Pulmonary/Chest: Effort normal and breath sounds normal.  Abdominal: Soft. Bowel sounds are normal. There is no tenderness.  Musculoskeletal:  Obvious laceration noted to the dorsal aspect of the right hand. Full rom of the fingers. Neurovascularly intact  Neurological: He is alert and oriented to person, place, and time.  Skin: Skin is warm and dry.  Psychiatric: He has  a normal mood and affect. His behavior is normal.  Nursing note and vitals reviewed.   ED Course  Irrigation Date/Time: 12/17/2014 1:01 AM Performed by: Teressa Lower Authorized by: Teressa Lower Consent: Verbal consent obtained. Risks and benefits: risks, benefits and alternatives were discussed Consent given by: patient Patient identity confirmed: verbally with patient Time out: Immediately prior to procedure a "time out" was called to verify the correct patient, procedure, equipment, support staff and site/side marked as required. Local anesthesia used: yes Anesthesia: local infiltration Local anesthetic: lidocaine 2% without epinephrine Patient tolerance: Patient tolerated the procedure well with no immediate complications Comments: Wound irrigated with   (including critical care time) DIAGNOSTIC STUDIES: Oxygen Saturation is 96% on RA, normal by my interpretation.    COORDINATION OF CARE: 10:40 PM-Discussed treatment plan which includes x-ray and anti-biotics with pt at bedside and pt agreed to plan.   Labs Review Labs Reviewed - No data to display  Imaging Review No results found.    EKG Interpretation None              MDM   Final diagnoses:  Animal bite    11:56 PM Spoke with Dr. Amanda Pea and will give iv antibiotics to the pt and will clean the wound. He will see the pt when he gets out of the OR 1:35 AM Pt is waiting for Dr. Amanda Pea, wound cleaned and signed out to Dr. Patria Mane I personally performed the services described in this documentation, which was scribed in my presence. The recorded information has been reviewed and is accurate.    Teressa Lower, NP 12/17/14 0144  Leta Baptist, MD 12/22/14 [phone]

## 2014-12-16 NOTE — ED Notes (Signed)
Pt reports he was bitten by a dog (pit bull) tonight to his right hand when trying to break up a dog fight. The dog is not updated in its rabies shot.

## 2014-12-17 MED ORDER — CIPROFLOXACIN HCL 500 MG PO TABS: 500.0000 mg | ORAL_TABLET | Freq: Two times a day (BID) | ORAL | Status: DC

## 2014-12-17 MED ORDER — CLINDAMYCIN HCL 300 MG PO CAPS: 300.0000 mg | ORAL_CAPSULE | Freq: Three times a day (TID) | ORAL | Status: DC

## 2014-12-17 MED ORDER — IBUPROFEN 600 MG PO TABS: 600.0000 mg | ORAL_TABLET | Freq: Three times a day (TID) | ORAL | Status: DC | PRN

## 2014-12-17 MED ORDER — HYDROCODONE-ACETAMINOPHEN 5-325 MG PO TABS: 1.0000 | ORAL_TABLET | Freq: Four times a day (QID) | ORAL | Status: DC | PRN

## 2014-12-17 MED ORDER — OXYCODONE-ACETAMINOPHEN 5-325 MG PO TABS
1.0000 | ORAL_TABLET | Freq: Once | ORAL | Status: AC
  Administered 2014-12-17: 1 via ORAL
  Filled 2014-12-17: qty 1

## 2014-12-17 MED ORDER — CIPROFLOXACIN HCL 500 MG PO TABS
500.0000 mg | ORAL_TABLET | Freq: Once | ORAL | Status: AC
  Administered 2014-12-17: 500 mg via ORAL
  Filled 2014-12-17: qty 1

## 2014-12-17 NOTE — Discharge Instructions (Signed)

## 2014-12-17 NOTE — Consult Note (Signed)
Reason for Consult: Pit bull bite right hand extensive in nature Referring Physician: ER physician  Joshua Davila is an 34 y.o. male.  HPI: Patient is 34 year old male with some tremendous challenges to was bitten by a pit bull tonight. He has injury to the hyperthenar region of his hand as well as his index finger and palmar region of his hand. This is the right upper extremity in question. 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 The patient and I have discussed all issues at length. He is here today with his friend/significant other.  He denies prior injury. He takes doxycycline 100 mg twice a day for acne prophylaxis already.  Patient is allergic to penicillin.  I reviewed the findings. He denies neck back chest or leg pain.  Past Medical History  Diagnosis Date  . Schizoaffective disorder   . PTSD (post-traumatic stress disorder)   . Depression   . Liver disorder     History reviewed. No pertinent past surgical history.  No family history on file.  Social History:  reports that he has been smoking Cigarettes.  He has been smoking about 1.00 pack per day. He does not have any smokeless tobacco history on file. He reports that he drinks alcohol. 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  Allergies:  Allergies  Allergen Reactions  . Penicillins Rash  . Sulfa Antibiotics Rash    Medications: I have reviewed the patient's current medications.  No results found for this or any previous visit (from the past 48 hour(s)).  Dg Hand Complete Right  12/16/2014   CLINICAL DATA:  Animal bite, bit by a dog. Soft tissue wound with active bleeding.  EXAM: RIGHT HAND - COMPLETE 3+ VIEW  COMPARISON:  None.  FINDINGS: Soft tissue injury about the ulnar aspect of the hand with soft tissue defect. No radiopaque foreign body. In overlying dressing is in place. No associated fracture. The alignment and joint spaces are maintained.  IMPRESSION: Soft tissue injury about  the ulnar aspect of the hand, no radiopaque foreign body or acute osseous abnormality.   Electronically Signed   By: Rubye Oaks M.D.   On: 12/16/2014 23:58    Review of Systems  Respiratory: Negative.   Cardiovascular: Negative.   Gastrointestinal: Negative.   Endo/Heme/Allergies: Negative.    Blood pressure 134/87, pulse 92, temperature 97.4 F (36.3 C), temperature source Oral, resp. rate 18, height  (1.803 m), weight 85.9 kg (189 lb 6 oz), SpO2 99 %. Physical Exam Mangling pit bull bite right hand no evidence of compartment syndrome. The hyperthenar region index finger and palm all have significant lacerations.   Assessment/Plan: Significant pit bull bite right hand multiple areas of laceration involvement.  Procedure: Patient was given a wrist block with lidocaine without epinephrine by myself following this the right index finger right hyperthenar space and right palm underwent Betadine scrub and paint. Following this sterile field secured. Hollowing this of informed irrigation debridement of skin is obtained tissue and muscle as well as some thorough exploration of the wound about the hyperthenar region. This was an 8 cm wound down to the muscle. The extensor apparatus was intact and there is no evidence of bony encroachment. Following this the patient underwent 3 L of saline flushing the wound. I was meticulous with this and then used bipolar electrocautery for hemostasis. The wound was loosely closed with 4-0 chromic suture.  Following this the hyperthenar region underwent irrigation and debridement of skin subcutaneous tissue 2  areas 1 cm in length. This was an excisional debridement with knife curet and scissor.  Following this index finger underwent I and D of skin 17 is tissue.  Patient tolerated this well. He'll be placed on antibiotics. We'll plan to see him back in the office in 2 days.  All questions have been encouraged and answered. Keep bandage clean and  dry.  Call for any problems.  No smoking.  Criteria for driving a car: you should be off your pain medicine for 7-8 hours, able to drive one handed(confident), thinking clearly and feeling able in your judgement to drive. Continue elevation as it will decrease swelling.  If instructed by MD move your fingers within the confines of the bandage/splint.  Use ice if instructed by your MD. Call immediately for any sudden loss of feeling in your hand/arm or change in functional abilities of the extremity. We recommend that you to take vitamin C 1000 mg a day to promote healing. We also recommend that if you require  pain medicine that you take a stool softener to prevent constipation as most pain medicines will have constipation side effects. We recommend either Peri-Colace or Senokot and recommend that you also consider adding MiraLAX to prevent the constipation affects from pain medicine if you are required to use them. These medicines are over the counter and maybe purchased at a local pharmacy. A cup of yogurt and a probiotic can also be helpful during the recovery process as the medicines can disrupt your intestinal environment.  Karen Chafe 12/17/2014, 3:33 AM

## 2015-03-04 ENCOUNTER — Encounter (HOSPITAL_COMMUNITY): Payer: Self-pay | Admitting: Emergency Medicine

## 2015-03-04 ENCOUNTER — Emergency Department (INDEPENDENT_AMBULATORY_CARE_PROVIDER_SITE_OTHER)
Admission: EM | Admit: 2015-03-04 | Discharge: 2015-03-04 | Disposition: A | Discharge status: Home / Self Care | Payer: Medicare Other | Source: Home / Self Care | Attending: Family Medicine | Admitting: Family Medicine

## 2015-03-04 DIAGNOSIS — S29009A Unspecified injury of muscle and tendon of unspecified wall of thorax, initial encounter: Secondary | ICD-10-CM | POA: Diagnosis not present

## 2015-03-04 DIAGNOSIS — S39012A Strain of muscle, fascia and tendon of lower back, initial encounter: Secondary | ICD-10-CM

## 2015-03-04 DIAGNOSIS — S29019A Strain of muscle and tendon of unspecified wall of thorax, initial encounter: Secondary | ICD-10-CM

## 2015-03-04 MED ORDER — CYCLOBENZAPRINE HCL 5 MG PO TABS: 5.0000 mg | ORAL_TABLET | Freq: Three times a day (TID) | ORAL | Status: DC | PRN

## 2015-03-04 MED ORDER — MELOXICAM 15 MG PO TABS: 15.0000 mg | ORAL_TABLET | Freq: Every day | ORAL | Status: DC

## 2015-03-04 NOTE — ED Notes (Signed)
The patient presented to the [facility] with a complaint of lower back pain that started when he bent over to pick up an item from under his bed 1 week ago.

## 2015-03-04 NOTE — Discharge Instructions (Signed)
Low Back Strain With Rehab A strain is an injury in which a tendon or muscle is torn. The muscles and tendons of the lower back are vulnerable to strains. However, these muscles and tendons are very strong and require a great force to be injured. Strains are classified into three categories. Grade 1 strains cause pain, but the tendon is not lengthened. Grade 2 strains include a lengthened ligament, due to the ligament being stretched or partially ruptured. With grade 2 strains there is still function, although the function may be decreased. Grade 3 strains involve a complete tear of the tendon or muscle, and function is usually impaired. SYMPTOMS   Pain in the lower back.  Pain that affects one side more than the other.  Pain that gets worse with movement and may be felt in the hip, buttocks, or back of the thigh.  Muscle spasms of the muscles in the back.  Swelling along the muscles of the back.  Loss of strength of the back muscles.  Crackling sound (crepitation) when the muscles are touched. CAUSES  Lower back strains occur when a force is placed on the muscles or tendons that is greater than they can handle. Common causes of injury include:  Prolonged overuse of the muscle-tendon units in the lower back, usually from incorrect posture.  A single violent injury or force applied to the back. RISK INCREASES WITH:  Sports that involve twisting forces on the spine or a lot of bending at the waist (football, rugby, weightlifting, bowling, golf, tennis, speed skating, racquetball, swimming, running, gymnastics, diving).  Poor strength and flexibility.  Failure to warm up properly before activity.  Family history of lower back pain or disk disorders.  Previous back injury or surgery (especially fusion).  Poor posture with lifting, especially heavy objects.  Prolonged sitting, especially with poor posture. PREVENTION   Learn and use proper posture when sitting or lifting (maintain  proper posture when sitting, lift using the knees and legs, not at the waist).  Warm up and stretch properly before activity.  Allow for adequate recovery between workouts.  Maintain physical fitness:  Strength, flexibility, and endurance.  Cardiovascular fitness. PROGNOSIS  If treated properly, lower back strains usually heal within 6 weeks. RELATED COMPLICATIONS   Recurring symptoms, resulting in a chronic problem.  Chronic inflammation, scarring, and partial muscle-tendon tear.  Delayed healing or resolution of symptoms.  Prolonged disability. TREATMENT  Treatment first involves the use of ice and medicine, to reduce pain and inflammation. The use of strengthening and stretching exercises may help reduce pain with activity. These exercises may be performed at home or with a therapist. Severe injuries may require referral to a therapist for further evaluation and treatment, such as ultrasound. Your caregiver may advise that you wear a back brace or corset, to help reduce pain and discomfort. Often, prolonged bed rest results in greater harm then benefit. Corticosteroid injections may be recommended. However, these should be reserved for the most serious cases. It is important to avoid using your back when lifting objects. At night, sleep on your back on a firm mattress with a pillow placed under your knees. If non-surgical treatment is unsuccessful, surgery may be needed.  MEDICATION   If pain medicine is needed, nonsteroidal anti-inflammatory medicines (aspirin and ibuprofen), or other minor pain relievers (acetaminophen), are often advised.  Do not take pain medicine for 7 days before surgery.  Prescription pain relievers may be given, if your caregiver thinks they are needed. Use only as  directed and only as much as you need.  Ointments applied to the skin may be helpful.  Corticosteroid injections may be given by your caregiver. These injections should be reserved for the most  serious cases, because they may only be given a certain number of times. HEAT AND COLD  Cold treatment (icing) should be applied for 10 to 15 minutes every 2 to 3 hours for inflammation and pain, and immediately after activity that aggravates your symptoms. Use ice packs or an ice massage.  Heat treatment may be used before performing stretching and strengthening activities prescribed by your caregiver, physical therapist, or athletic trainer. Use a heat pack or a warm water soak. SEEK MEDICAL CARE IF:   Symptoms get worse or do not improve in 2 to 4 weeks, despite treatment.  You develop numbness, weakness, or loss of bowel or bladder function.  New, unexplained symptoms develop. (Drugs used in treatment may produce side effects.) EXERCISES  RANGE OF MOTION (ROM) AND STRETCHING EXERCISES - Low Back Strain Most people with lower back pain will find that their symptoms get worse with excessive bending forward (flexion) or arching at the lower back (extension). The exercises which will help resolve your symptoms will focus on the opposite motion.  Your physician, physical therapist or athletic trainer will help you determine which exercises will be most helpful to resolve your lower back pain. Do not complete any exercises without first consulting with your caregiver. Discontinue any exercises which make your symptoms worse until you speak to your caregiver.  If you have pain, numbness or tingling which travels down into your buttocks, leg or foot, the goal of the therapy is for these symptoms to move closer to your back and eventually resolve. Sometimes, these leg symptoms will get better, but your lower back pain may worsen. This is typically an indication of progress in your rehabilitation. Be very alert to any changes in your symptoms and the activities in which you participated in the 24 hours prior to the change. Sharing this information with your caregiver will allow him/her to most efficiently  treat your condition.  These exercises may help you when beginning to rehabilitate your injury. Your symptoms may resolve with or without further involvement from your physician, physical therapist or athletic trainer. While completing these exercises, remember:  Restoring tissue flexibility helps normal motion to return to the joints. This allows healthier, less painful movement and activity.  An effective stretch should be held for at least 30 seconds.  A stretch should never be painful. You should only feel a gentle lengthening or release in the stretched tissue. FLEXION RANGE OF MOTION AND STRETCHING EXERCISES: STRETCH - Flexion, Single Knee to Chest   Lie on a firm bed or floor with both legs extended in front of you.  Keeping one leg in contact with the floor, bring your opposite knee to your chest. Hold your leg in place by either grabbing behind your thigh or at your knee.  Pull until you feel a gentle stretch in your lower back. Hold __________ seconds.  Slowly release your grasp and repeat the exercise with the opposite side. Repeat __________ times. Complete this exercise __________ times per day.  STRETCH - Flexion, Double Knee to Chest   Lie on a firm bed or floor with both legs extended in front of you.  Keeping one leg in contact with the floor, bring your opposite knee to your chest.  Tense your stomach muscles to support your back and then   lift your other knee to your chest. Hold your legs in place by either grabbing behind your thighs or at your knees.  Pull both knees toward your chest until you feel a gentle stretch in your lower back. Hold __________ seconds.  Tense your stomach muscles and slowly return one leg at a time to the floor. Repeat __________ times. Complete this exercise __________ times per day.  STRETCH - Low Trunk Rotation  Lie on a firm bed or floor. Keeping your legs in front of you, bend your knees so they are both pointed toward the ceiling  and your feet are flat on the floor.  Extend your arms out to the side. This will stabilize your upper body by keeping your shoulders in contact with the floor.  Gently and slowly drop both knees together to one side until you feel a gentle stretch in your lower back. Hold for __________ seconds.  Tense your stomach muscles to support your lower back as you bring your knees back to the starting position. Repeat the exercise to the other side. Repeat __________ times. Complete this exercise __________ times per day  EXTENSION RANGE OF MOTION AND FLEXIBILITY EXERCISES: STRETCH - Extension, Prone on Elbows   Lie on your stomach on the floor, a bed will be too soft. Place your palms about shoulder width apart and at the height of your head.  Place your elbows under your shoulders. If this is too painful, stack pillows under your chest.  Allow your body to relax so that your hips drop lower and make contact more completely with the floor.  Hold this position for __________ seconds.  Slowly return to lying flat on the floor. Repeat __________ times. Complete this exercise __________ times per day.  RANGE OF MOTION - Extension, Prone Press Ups  Lie on your stomach on the floor, a bed will be too soft. Place your palms about shoulder width apart and at the height of your head.  Keeping your back as relaxed as possible, slowly straighten your elbows while keeping your hips on the floor. You may adjust the placement of your hands to maximize your comfort. As you gain motion, your hands will come more underneath your shoulders.  Hold this position __________ seconds.  Slowly return to lying flat on the floor. Repeat __________ times. Complete this exercise __________ times per day.  RANGE OF MOTION- Quadruped, Neutral Spine   Assume a hands and knees position on a firm surface. Keep your hands under your shoulders and your knees under your hips. You may place padding under your knees for  comfort.  Drop your head and point your tail bone toward the ground below you. This will round out your lower back like an angry cat. Hold this position for __________ seconds.  Slowly lift your head and release your tail bone so that your back sags into a large arch, like an old horse.  Hold this position for __________ seconds.  Repeat this until you feel limber in your lower back.  Now, find your "sweet spot." This will be the most comfortable position somewhere between the two previous positions. This is your neutral spine. Once you have found this position, tense your stomach muscles to support your lower back.  Hold this position for __________ seconds. Repeat __________ times. Complete this exercise __________ times per day.  STRENGTHENING EXERCISES - Low Back Strain These exercises may help you when beginning to rehabilitate your injury. These exercises should be done near your "sweet   spot." This is the neutral, low-back arch, somewhere between fully rounded and fully arched, that is your least painful position. When performed in this safe range of motion, these exercises can be used for people who have either a flexion or extension based injury. These exercises may resolve your symptoms with or without further involvement from your physician, physical therapist or athletic trainer. While completing these exercises, remember:  °· Muscles can gain both the endurance and the strength needed for everyday activities through controlled exercises. °· Complete these exercises as instructed by your physician, physical therapist or athletic trainer. Increase the resistance and repetitions only as guided. °· You may experience muscle soreness or fatigue, but the pain or discomfort you are trying to eliminate should never worsen during these exercises. If this pain does worsen, stop and make certain you are following the directions exactly. If the pain is still present after adjustments, discontinue the  exercise until you can discuss the trouble with your caregiver. °STRENGTHENING - Deep Abdominals, Pelvic Tilt °· Lie on a firm bed or floor. Keeping your legs in front of you, bend your knees so they are both pointed toward the ceiling and your feet are flat on the floor. °· Tense your lower abdominal muscles to press your lower back into the floor. This motion will rotate your pelvis so that your tail bone is scooping upwards rather than pointing at your feet or into the floor. °· With a gentle tension and even breathing, hold this position for __________ seconds. °Repeat __________ times. Complete this exercise __________ times per day.  °STRENGTHENING - Abdominals, Crunches  °· Lie on a firm bed or floor. Keeping your legs in front of you, bend your knees so they are both pointed toward the ceiling and your feet are flat on the floor. Cross your arms over your chest. °· Slightly tip your chin down without bending your neck. °· Tense your abdominals and slowly lift your trunk high enough to just clear your shoulder blades. Lifting higher can put excessive stress on the lower back and does not further strengthen your abdominal muscles. °· Control your return to the starting position. °Repeat __________ times. Complete this exercise __________ times per day.  °STRENGTHENING - Quadruped, Opposite UE/LE Lift  °· Assume a hands and knees position on a firm surface. Keep your hands under your shoulders and your knees under your hips. You may place padding under your knees for comfort. °· Find your neutral spine and gently tense your abdominal muscles so that you can maintain this position. Your shoulders and hips should form a rectangle that is parallel with the floor and is not twisted. °· Keeping your trunk steady, lift your right hand no higher than your shoulder and then your left leg no higher than your hip. Make sure you are not holding your breath. Hold this position __________ seconds. °· Continuing to keep your  abdominal muscles tense and your back steady, slowly return to your starting position. Repeat with the opposite arm and leg. °Repeat __________ times. Complete this exercise __________ times per day.  °STRENGTHENING - Lower Abdominals, Double Knee Lift °· Lie on a firm bed or floor. Keeping your legs in front of you, bend your knees so they are both pointed toward the ceiling and your feet are flat on the floor. °· Tense your abdominal muscles to brace your lower back and slowly lift both of your knees until they come over your hips. Be certain not to hold your breath. °·   Hold __________ seconds. Using your abdominal muscles, return to the starting position in a slow and controlled manner. Repeat __________ times. Complete this exercise __________ times per day.  POSTURE AND BODY MECHANICS CONSIDERATIONS - Low Back Strain Keeping correct posture when sitting, standing or completing your activities will reduce the stress put on different body tissues, allowing injured tissues a chance to heal and limiting painful experiences. The following are general guidelines for improved posture. Your physician or physical therapist will provide you with any instructions specific to your needs. While reading these guidelines, remember:  The exercises prescribed by your provider will help you have the flexibility and strength to maintain correct postures.  The correct posture provides the best environment for your joints to work. All of your joints have less wear and tear when properly supported by a spine with good posture. This means you will experience a healthier, less painful body.  Correct posture must be practiced with all of your activities, especially prolonged sitting and standing. Correct posture is as important when doing repetitive low-stress activities (typing) as it is when doing a single heavy-load activity (lifting). RESTING POSITIONS Consider which positions are most painful for you when choosing a  resting position. If you have pain with flexion-based activities (sitting, bending, stooping, squatting), choose a position that allows you to rest in a less flexed posture. You would want to avoid curling into a fetal position on your side. If your pain worsens with extension-based activities (prolonged standing, working overhead), avoid resting in an extended position such as sleeping on your stomach. Most people will find more comfort when they rest with their spine in a more neutral position, neither too rounded nor too arched. Lying on a non-sagging bed on your side with a pillow between your knees, or on your back with a pillow under your knees will often provide some relief. Keep in mind, being in any one position for a prolonged period of time, no matter how correct your posture, can still lead to stiffness. PROPER SITTING POSTURE In order to minimize stress and discomfort on your spine, you must sit with correct posture. Sitting with good posture should be effortless for a healthy body. Returning to good posture is a gradual process. Many people can work toward this most comfortably by using various supports until they have the flexibility and strength to maintain this posture on their own. When sitting with proper posture, your ears will fall over your shoulders and your shoulders will fall over your hips. You should use the back of the chair to support your upper back. Your lower back will be in a neutral position, just slightly arched. You may place a small pillow or folded towel at the base of your lower back for support.  When working at a desk, create an environment that supports good, upright posture. Without extra support, muscles tire, which leads to excessive strain on joints and other tissues. Keep these recommendations in mind: CHAIR:  A chair should be able to slide under your desk when your back makes contact with the back of the chair. This allows you to work closely.  The chair's  height should allow your eyes to be level with the upper part of your monitor and your hands to be slightly lower than your elbows. BODY POSITION  Your feet should make contact with the floor. If this is not possible, use a foot rest.  Keep your ears over your shoulders. This will reduce stress on your neck and  lower back. INCORRECT SITTING POSTURES  If you are feeling tired and unable to assume a healthy sitting posture, do not slouch or slump. This puts excessive strain on your back tissues, causing more damage and pain. Healthier options include:  Using more support, like a lumbar pillow.  Switching tasks to something that requires you to be upright or walking.  Talking a brief walk.  Lying down to rest in a neutral-spine position. PROLONGED STANDING WHILE SLIGHTLY LEANING FORWARD  When completing a task that requires you to lean forward while standing in one place for a long time, place either foot up on a stationary 2-4 inch high object to help maintain the best posture. When both feet are on the ground, the lower back tends to lose its slight inward curve. If this curve flattens (or becomes too large), then the back and your other joints will experience too much stress, tire more quickly, and can cause pain. CORRECT STANDING POSTURES Proper standing posture should be assumed with all daily activities, even if they only take a few moments, like when brushing your teeth. As in sitting, your ears should fall over your shoulders and your shoulders should fall over your hips. You should keep a slight tension in your abdominal muscles to brace your spine. Your tailbone should point down to the ground, not behind your body, resulting in an over-extended swayback posture.  INCORRECT STANDING POSTURES  Common incorrect standing postures include a forward head, locked knees and/or an excessive swayback. WALKING Walk with an upright posture. Your ears, shoulders and hips should all  line-up. PROLONGED ACTIVITY IN A FLEXED POSITION When completing a task that requires you to bend forward at your waist or lean over a low surface, try to find a way to stabilize 3 out of 4 of your limbs. You can place a hand or elbow on your thigh or rest a knee on the surface you are reaching across. This will provide you more stability so that your muscles do not fatigue as quickly. By keeping your knees relaxed, or slightly bent, you will also reduce stress across your lower back. CORRECT LIFTING TECHNIQUES DO :   Assume a wide stance. This will provide you more stability and the opportunity to get as close as possible to the object which you are lifting.  Tense your abdominals to brace your spine. Bend at the knees and hips. Keeping your back locked in a neutral-spine position, lift using your leg muscles. Lift with your legs, keeping your back straight.  Test the weight of unknown objects before attempting to lift them.  Try to keep your elbows locked down at your sides in order get the best strength from your shoulders when carrying an object.  Always ask for help when lifting heavy or awkward objects. INCORRECT LIFTING TECHNIQUES DO NOT:   Lock your knees when lifting, even if it is a small object.  Bend and twist. Pivot at your feet or move your feet when needing to change directions.  Assume that you can safely pick up even a paper clip without proper posture.   This information is not intended to replace advice given to you by your health care provider. Make sure you discuss any questions you have with your health care provider.   Document Released: 03/09/2005 Document Revised: 03/30/2014 Document Reviewed: 06/21/2008 [employer] Interactive Patient Education 2016 [employer]  Mid-Back Strain With Rehab  A strain is an injury in which a tendon or muscle is torn. The muscles  and tendons of the mid-back are vulnerable to strains. However, these muscles and tendons are very  strong and require a great force to be injured. The muscles of the mid-back are responsible for stabilizing the spinal column, as well as spinal twisting (rotation). Strains are classified into three categories. Grade 1 strains cause pain, but the tendon is not lengthened. Grade 2 strains include a lengthened ligament, due to the ligament being stretched or partially ruptured. With grade 2 strains there is still function, although the function may be decreased. Grade 3 strains involve a complete tear of the tendon or muscle, and function is usually impaired. SYMPTOMS   Pain in the middle of the back.  Pain that may affect only one side, and is worse with movement.  Muscle spasms, and often swelling in the back.  Loss of strength of the back muscles.  Crackling sound (crepitation) when the muscles are touched. CAUSES  Mid-back strains occur when a force is placed on the muscles or tendons that is greater than they can handle. Common causes of injury include:  Ongoing overuse of the muscle-tendon units in the middle back, usually from incorrect body posture.  A single violent injury or force applied to the back. RISK INCREASES WITH:  Sports that involve twisting forces on the spine or a lot of bending at the waist (football, rugby, weightlifting, bowling, golf, tennis, speed skating, racquetball, swimming, running, gymnastics, diving).  Poor strength and flexibility.  Failure to warm up properly before activity.  Family history of low back pain or disk disorders.  Previous back injury or surgery (especially fusion). PREVENTION  Learn and use proper sports technique.  Warm up and stretch properly before activity.  Allow for adequate recovery between workouts.  Maintain physical fitness:  Strength, flexibility, and endurance.  Cardiovascular fitness. PROGNOSIS  If treated properly, mid-back strains usually heal within 6 weeks. RELATED COMPLICATIONS   Frequently recurring  symptoms, resulting in a chronic problem. Properly treating the problem the first time decreases frequency of recurrence.  Chronic inflammation, scarring, and partial muscle-tendon tear.  Delayed healing or resolution of symptoms, especially if activity is resumed too soon.  Prolonged disability. TREATMENT Treatment first involves the use of ice and medicine, to reduce pain and inflammation. As the pain begins to subside, you may begin strengthening and stretching exercises to improve body posture and sport technique. These exercises may be performed at home or with a therapist. Severe injuries may require referral to a therapist for further evaluation and treatment, such as ultrasound. Corticosteroid injections may be given to help reduce inflammation. Biofeedback (watching monitors of your body processes) and psychotherapy may also be prescribed. Prolonged bed rest is felt to do more harm than good. Massage may help break the muscle spasms. Sometimes, an injection of cortisone, with or without local anesthetics, may be given to help relieve the pain and spasms. MEDICATION   If pain medicine is needed, nonsteroidal anti-inflammatory medicines (aspirin and ibuprofen), or other minor pain relievers (acetaminophen), are often advised.  Do not take pain medicine for 7 days before surgery.  Prescription pain relievers may be given, if your caregiver thinks they are needed. Use only as directed and only as much as you need.  Ointments applied to the skin may be helpful.  Corticosteroid injections may be given by your caregiver. These injections should be reserved for the most serious cases, because they may only be given a certain number of times. HEAT AND COLD:   Cold treatment (icing) should  be applied for 10 to 15 minutes every 2 to 3 hours for inflammation and pain, and immediately after activity that aggravates your symptoms. Use ice packs or an ice massage.  Heat treatment may be used before  performing stretching and strengthening activities prescribed by your caregiver, physical therapist, or athletic trainer. Use a heat pack or a warm water soak. SEEK IMMEDIATE MEDICAL CARE IF:  Symptoms get worse or do not improve in 2 to 4 weeks, despite treatment.  You develop numbness, weakness, or loss of bowel or bladder function.  New, unexplained symptoms develop. (Drugs used in treatment may produce side effects.) EXERCISES RANGE OF MOTION (ROM) AND STRETCHING EXERCISES - Mid-Back Strain These exercises may help you when beginning to rehabilitate your injury. In order to successfully resolve your symptoms, you must improve your posture. These exercises are designed to help reduce the forward-head and rounded-shoulder posture which contributes to this condition. Your symptoms may resolve with or without further involvement from your physician, physical therapist or athletic trainer. While completing these exercises, remember:   Restoring tissue flexibility helps normal motion to return to the joints. This allows healthier, less painful movement and activity.  An effective stretch should be held for at least 30 seconds.  A stretch should never be painful. You should only feel a gentle lengthening or release in the stretched tissue. STRETCH - Axial Extension  Stand or sit on a firm surface. Assume a good posture: chest up, shoulders drawn back, stomach muscles slightly tense, knees unlocked (if standing) and feet hip width apart.  Slowly retract your chin, so your head slides back and your chin slightly lowers. Continue to look straight ahead.  You should feel a gentle stretch in the back of your head. Be certain not to feel an aggressive stretch since this can cause headaches later.  Hold for __________ seconds. Repeat __________ times. Complete this exercise __________ times per day. RANGE OF MOTION- Upper Thoracic Extension  Sit on a firm chair with a high back. Assume a good  posture: chest up, shoulders drawn back, abdominal muscles slightly tense, and feet hip width apart. Place a small pillow or folded towel in the curve of your lower back, if you are having difficulty maintaining good posture.  Gently brace your neck with your hands, allowing your arms to rest on your chest.  Continue to support your neck and slowly extend your back over the chair. You will feel a stretch across your upper back.  Hold __________ seconds. Slowly return to the starting position. Repeat __________ times. Complete this exercise __________ times per day. RANGE OF MOTION- Mid-Thoracic Extension  Roll a towel so that it is about 4 inches in diameter.  Position the towel lengthwise. Lay on the towel so that your spine, but not your shoulder blades, are supported.  You should feel your mid-back arching toward the floor. To increase the stretch, extend your arms away from your body.  Hold for __________ seconds. Repeat exercise __________ times, __________ times per day. STRENGTHENING EXERCISES - Mid-Back Strain These exercises may help you when beginning to rehabilitate your injury. They may resolve your symptoms with or without further involvement from your physician, physical therapist or athletic trainer. While completing these exercises, remember:   Muscles can gain both the endurance and the strength needed for everyday activities through controlled exercises.  Complete these exercises as instructed by your physician, physical therapist or athletic trainer. Increase the resistance and repetitions only as guided by your caregiver.  You may experience muscle soreness or fatigue, but the pain or discomfort you are trying to eliminate should never worsen during these exercises. If this pain does worsen, stop and make certain you are following the directions exactly. If the pain is still present after adjustments, discontinue the exercise until you can discuss the trouble with your  caregiver. STRENGTHENING - Quadruped, Opposite UE/LE Lift  Assume a hands and knees position on a firm surface. Keep your hands under your shoulders and your knees under your hips. You may place padding under your knees for comfort.  Find your neutral spine and gently tense your abdominal muscles so that you can maintain this position. Your shoulders and hips should form a rectangle that is parallel with the floor and is not twisted.  Keeping your trunk steady, lift your right hand no higher than your shoulder and then your left leg no higher than your hip. Make sure you are not holding your breath. Hold this position __________ seconds.  Continuing to keep your abdominal muscles tense and your back steady, slowly return to your starting position. Repeat with the opposite arm and leg. Repeat __________ times. Complete this exercise __________ times per day.  STRENGTH - Shoulder Extensors  Secure a rubber exercise band or tubing to a fixed object (table, pole) so that it is at the height of your shoulders when you are either standing, or sitting on a firm armless chair.  With a thumbs-up grip, grasp an end of the band in each hand. Straighten your elbows and lift your hands straight in front of you at shoulder height. Step back away from the secured end of band, until it becomes tense.  Squeezing your shoulder blades together, pull your hands down to the sides of your thighs. Do not allow your hands to go behind you.  Hold for __________ seconds. Slowly ease the tension on the band, as you reverse the directions and return to the starting position. Repeat __________ times. Complete this exercise __________ times per day.  STRENGTH - Horizontal Abductors Choose one of the two positions to complete this exercise. Prone: lying on stomach:  Lie on your stomach on a firm surface so that your right / left arm overhangs the edge. Rest your forehead on your opposite forearm. With your palm facing the  floor and your elbow straight, hold a __________ weight in your hand.  Squeeze your right / left shoulder blade to your mid-back spine and then slowly raise your arm to the height of the bed.  Hold for __________ seconds. Slowly reverse the directions and return to the starting position, controlling the weight as you lower your arm. Repeat __________ times. Complete this exercise __________ times per day. Standing:   Secure a rubber exercise band or tubing, so that it is at the height of your shoulders when you are either standing, or sitting on a firm armless chair.  Grasp an end of the band in each hand and have your palms face each other. Straighten your elbows and lift your hands straight in front of you at shoulder height. Step back away from the secured end of band, until it becomes tense.  Squeeze your shoulder blades together. Keeping your elbows locked and your hands at shoulder height, spread your arms apart, forming a "T" shape with your body. Hold __________ seconds. Slowly ease the tension on the band, as you reverse the directions and return to the starting position. Repeat __________ times. Complete this exercise __________ times per  day. STRENGTH - Scapular Retractors and External Rotators, Rowing  Secure a rubber exercise band or tubing, so that it is at the height of your shoulders when you are either standing, or sitting on a firm armless chair.  With a palm-down grip, grasp an end of the band in each hand. Straighten your elbows and lift your hands straight in front of you at shoulder height. Step back away from the secured end of band, until it becomes tense.  Step 1: Squeeze your shoulder blades together. Bending your elbows, draw your hands to your chest as if you are rowing a boat. At the end of this motion, your hands and elbow should be at shoulder height and your elbows should be out to your sides.  Step 2: Rotate your shoulder to raise your hands above your head.  Your forearms should be vertical and your upper arms should be horizontal.  Hold for __________ seconds. Slowly ease the tension on the band, as you reverse the directions and return to the starting position. Repeat __________ times. Complete this exercise __________ times per day.  POSTURE AND BODY MECHANICS CONSIDERATIONS - Mid-Back Strain Keeping correct posture when sitting, standing or completing your activities will reduce the stress put on different body tissues, allowing injured tissues a chance to heal and limiting painful experiences. The following are general guidelines for improved posture. Your physician or physical therapist will provide you with any instructions specific to your needs. While reading these guidelines, remember:  The exercises prescribed by your provider will help you have the flexibility and strength to maintain correct postures.  The correct posture provides the best environment for your joints to work. All of your joints have less wear and tear when properly supported by a spine with good posture. This means you will experience a healthier, less painful body.  Correct posture must be practiced with all of your activities, especially prolonged sitting and standing. Correct posture is as important when doing repetitive low-stress activities (typing) as it is when doing a single heavy-load activity (lifting). PROPER SITTING POSTURE In order to minimize stress and discomfort on your spine, you must sit with correct posture. Sitting with good posture should be effortless for a healthy body. Returning to good posture is a gradual process. Many people can work toward this most comfortably by using various supports until they have the flexibility and strength to maintain this posture on their own. When sitting with proper posture, your ears will fall over your shoulders and your shoulders will fall over your hips. You should use the back of the chair to support your upper back.  Your lower back will be in a neutral position, just slightly arched. You may place a small pillow or folded towel at the base of your low back for  support.  When working at a desk, create an environment that supports good, upright posture. Without extra support, muscles fatigue and lead to excessive strain on joints and other tissues. Keep these recommendations in mind: CHAIR:  A chair should be able to slide under your desk when your back makes contact with the back of the chair. This allows you to work closely.  The chair's height should allow your eyes to be level with the upper part of your monitor and your hands to be slightly lower than your elbows. BODY POSITION  Your feet should make contact with the floor. If this is not possible, use a foot rest.  Keep your ears over your shoulders. This  will reduce stress on your neck and lower back. INCORRECT SITTING POSTURES If you are feeling tired and unable to assume a healthy sitting posture, do not slouch or slump. This puts excessive strain on your back tissues, causing more damage and pain. Healthier options include:  Using more support, like a lumbar pillow.  Switching tasks to something that requires you to be upright or walking.  Talking a brief walk.  Lying down to rest in a neutral-spine position. CORRECT STANDING POSTURES Proper standing posture should be assumed with all daily activities, even if they only take a few moments, like when brushing your teeth. As in sitting, your ears should fall over your shoulders and your shoulders should fall over your hips. You should keep a slight tension in your abdominal muscles to brace your spine. Your tailbone should point down to the ground, not behind your body, resulting in an over-extended swayback posture.  INCORRECT STANDING POSTURES Common incorrect standing postures include a forward head, locked knees, and an excessive swayback. WALKING Walk with an upright posture. Your ears,  shoulders and hips should all line-up. CORRECT LIFTING TECHNIQUES DO :   Assume a wide stance. This will provide you more stability and the opportunity to get as close as possible to the object which you are lifting.  Tense your abdominals to brace your spine. Bend at the knees and hips. Keeping your back locked in a neutral-spine position, lift using your leg muscles. Lift with your legs, keeping your back straight.  Test the weight of unknown objects before attempting to lift them.  Try to keep your elbows locked down at your sides in order get the best strength from your shoulders when carrying an object.  Always ask for help when lifting heavy or awkward objects. INCORRECT LIFTING TECHNIQUES DO NOT:   Lock your knees when lifting, even if it is a small object.  Bend and twist. Pivot at your feet or move your feet when needing to change directions.  Assume that you can safely pick up even a paperclip without proper posture.   This information is not intended to replace advice given to you by your health care provider. Make sure you discuss any questions you have with your health care provider.   Document Released: 03/09/2005 Document Revised: 07/24/2014 Document Reviewed: 06/21/2008 Elsevier Interactive Patient Education 2016 [employer]  Muscle Strain A muscle strain (pulled muscle) happens when a muscle is stretched beyond normal length. It happens when a sudden, violent force stretches your muscle too far. Usually, a few of the fibers in your muscle are torn. Muscle strain is common in athletes. Recovery usually takes 1-2 weeks. Complete healing takes 5-6 weeks.  HOME CARE   Follow the PRICE method of treatment to help your injury get better. Do this the first 2-3 days after the injury:  Protect. Protect the muscle to keep it from getting injured again.  Rest. Limit your activity and rest the injured body part.  Ice. Put ice in a plastic bag. Place a towel between your  skin and the bag. Then, apply the ice and leave it on from 15-20 minutes each hour. After the third day, switch to moist heat packs.  Compression. Use a splint or elastic bandage on the injured area for comfort. Do not put it on too tightly.  Elevate. Keep the injured body part above the level of your heart.  Only take medicine as told by your doctor.  Warm up before doing exercise to prevent  future muscle strains. GET HELP IF:   You have more pain or puffiness (swelling) in the injured area.  You feel numbness, tingling, or notice a loss of strength in the injured area. MAKE SURE YOU:   Understand these instructions.  Will watch your condition.  Will get help right away if you are not doing well or get worse.   This information is not intended to replace advice given to you by your health care provider. Make sure you discuss any questions you have with your health care provider.   Document Released: 12/17/2007 Document Revised: 12/28/2012 Document Reviewed: 10/06/2012 Elsevier Interactive Patient Education [employer]

## 2015-03-04 NOTE — ED Provider Notes (Signed)
CSN: [id number]     Arrival date & time 03/04/15  1559 History   First MD Initiated Contact with Patient 03/04/15 1653     Chief Complaint  Patient presents with  . Back Pain   (Consider location/radiation/quality/duration/timing/severity/associated sxs/prior Treatment) HPI Comments: 34 year old male states that one week ago he leaned over to put any shoes on in experience sudden acute pain in his lower thoracic and lumbar musculature. It is worse with standing, bending and lifting. He denies radiation of pain. Denies distal weakness or paresthesias. He states it is better when lying supine recombinant.   Past Medical History  Diagnosis Date  . Schizoaffective disorder (HCC)   . PTSD (post-traumatic stress disorder)   . Depression   . Liver disorder    History reviewed. No pertinent past surgical history. History reviewed. No pertinent family history. Social History  Substance Use Topics  . Smoking status: Current Every Day Smoker -- 1.00 packs/day    Types: Cigarettes  . Smokeless tobacco: None  . Alcohol Use: Yes     Comment: occasional    Review of Systems  Constitutional: Positive for activity change. Negative for fever and fatigue.  Respiratory: Negative.   Gastrointestinal: Negative.   Genitourinary: Negative.   Musculoskeletal: Positive for myalgias and back pain. Negative for gait problem, neck pain and neck stiffness.       As per HPI  Skin: Negative.   Neurological: Negative for dizziness, weakness, numbness and headaches.    Allergies  Penicillins and Sulfa antibiotics  Home Medications   Prior to Admission medications   Medication Sig Start Date End Date Taking? Authorizing Provider  ARIPiprazole (ABILIFY) 10 MG tablet Take 1 tablet (10 mg total) by mouth daily. For mood control 10/09/13   Sanjuana Kava, NP  aspirin EC 81 MG tablet Take 1 tablet (81 mg total) by mouth daily. For heart health 10/09/13   Sanjuana Kava, NP  atorvastatin (LIPITOR) 40 MG tablet  Take 0.5 tablets (20 mg total) by mouth daily. For high cholesterol 10/09/13   Sanjuana Kava, NP  bacitracin ointment Apply 1 application topically 2 (two) times daily. 06/16/14   Antony Madura, PA-C  buPROPion (WELLBUTRIN XL) 150 MG 24 hr tablet Take 1 tablet (150 mg total) by mouth daily. For depression 10/09/13   Sanjuana Kava, NP  ciprofloxacin (CIPRO) 500 MG tablet Take 1 tablet (500 mg total) by mouth every 12 (twelve) hours. 12/17/14   Azalia Bilis, MD  clindamycin (CLEOCIN) 300 MG capsule Take 1 capsule (300 mg total) by mouth 3 (three) times daily. 12/17/14   Azalia Bilis, MD  cyclobenzaprine (FLEXERIL) 5 MG tablet Take 1 tablet (5 mg total) by mouth 3 (three) times daily as needed for muscle spasms. 03/04/15   Hayden Rasmussen, NP  emtricitabine-tenofovir (TRUVADA) 200-300 MG per tablet Take 1 tablet by mouth daily. 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 For male hormone replacement 10/09/13   Sanjuana Kava, NP  finasteride (PROSCAR) 5 MG tablet Take 2 tablets (10 mg total) by mouth daily. For enlarged prostate 10/09/13   Sanjuana Kava, NP  gabapentin (NEURONTIN) 300 MG capsule Take 1 capsule (300 mg total) by mouth 3 (three) times daily. For substance withdrawal syndrome/pain Patient taking differently: Take 900 mg by mouth 3 (three) times daily. For substance withdrawal syndrome/pain 10/09/13   Sanjuana Kava, NP  HYDROcodone-acetaminophen (  NORCO/VICODIN) 5-325 MG per tablet Take 1 tablet by mouth every 6 (six) hours as needed for moderate pain. 12/17/14   Azalia BilisKevin Campos, MD  hydrocortisone 2.5 % cream Apply 1 application topically daily. For itching 10/09/13   Sanjuana KavaAgnes I Nwoko, NP  hydrOXYzine (ATARAX/VISTARIL) 25 MG tablet Take 25 mg three times daily as needed: For anxiety Patient not taking: Reported on 02/09/2014 10/09/13   Sanjuana KavaAgnes I Nwoko, NP  hydrOXYzine (ATARAX/VISTARIL) 25 MG tablet Take 1 tablet (25 mg  total) by mouth every 6 (six) hours. Prn itching Patient not taking: Reported on 06/15/2014 02/14/14   Linna HoffJames D Kindl, MD  ibuprofen (ADVIL,MOTRIN) 600 MG tablet Take 1 tablet (600 mg total) by mouth every 8 (eight) hours as needed. 12/17/14   Azalia BilisKevin Campos, MD  meloxicam (MOBIC) 15 MG tablet Take 1 tablet (15 mg total) by mouth daily. 03/04/15   Hayden Rasmussenavid Keygan Dumond, NP  MINOXIDIL, TOPICAL, (MINOXIDIL FOR MEN) 5 % SOLN Apply 1 application topically 2 (two) times daily. For thinning hair 10/09/13   Sanjuana KavaAgnes I Nwoko, NP  Omega-3 Fatty Acids (FISH OIL) 1000 MG CAPS Take 3 capsules (3,000 mg total) by mouth daily. For high cholesterol 10/09/13   Sanjuana KavaAgnes I Nwoko, NP  pantoprazole (PROTONIX) 40 MG tablet Take 1 tablet (40 mg total) by mouth daily. For acid reflux 10/09/13   Sanjuana KavaAgnes I Nwoko, NP  PARoxetine (PAXIL) 20 MG tablet Take 1 tablet (20 mg total) by mouth daily. For depression 10/09/13   Sanjuana KavaAgnes I Nwoko, NP   Meds Ordered and Administered this Visit  Medications - No data to display  BP 115/63 mmHg  Pulse 86  Temp(Src) 99 F (37.2 C) (Oral)  SpO2 100% No data found.   Physical Exam  Constitutional: He is oriented to person, place, and time. He appears well-developed and well-nourished. No distress.  HENT:  Head: Normocephalic and atraumatic.  Eyes: EOM are normal. Left eye exhibits no discharge.  Neck: Normal range of motion. Neck supple.  Cardiovascular: Normal rate.   Pulmonary/Chest: Effort normal. No respiratory distress.  Musculoskeletal: He exhibits no edema.  There is tenderness to various lower parathoracic and paralumbar musculature. No spinal tenderness, deformity, swelling or discoloration. No percussion tenderness. Lower extremity strength is 5 over 5 and symmetric.  Neurological: He is alert and oriented to person, place, and time. No cranial nerve deficit.  Skin: Skin is warm and dry.  Psychiatric: He has a normal mood and affect.  Nursing note and vitals reviewed.   ED Course  Procedures  (including critical care time)  Labs Review Labs Reviewed - No data to display  Imaging Review No results found.   Visual Acuity Review  Right Eye Distance:   Left Eye Distance:   Bilateral Distance:    Right Eye Near:   Left Eye Near:    Bilateral Near:         MDM   1. Lumbar strain, initial encounter   2. Thoracic myofascial strain, initial encounter    Heat and stretches frequently. Do not go to bed and remained there Mobic 15 mg one daily when necessary Flexeril 5 mg 3 times a day when necessary    Hayden Rasmussenavid Keandra Medero, NP 03/04/15 1752

## 2015-06-19 ENCOUNTER — Encounter (HOSPITAL_COMMUNITY): Payer: Self-pay

## 2015-06-19 ENCOUNTER — Emergency Department (HOSPITAL_COMMUNITY)
Admission: EM | Admit: 2015-06-19 | Discharge: 2015-06-20 | Disposition: A | Discharge status: Home / Self Care | Payer: Medicare Other | Attending: Emergency Medicine | Admitting: Emergency Medicine

## 2015-06-19 DIAGNOSIS — F1210 Cannabis abuse, uncomplicated: Secondary | ICD-10-CM | POA: Diagnosis not present

## 2015-06-19 DIAGNOSIS — F329 Major depressive disorder, single episode, unspecified: Secondary | ICD-10-CM | POA: Diagnosis not present

## 2015-06-19 DIAGNOSIS — F1310 Sedative, hypnotic or anxiolytic abuse, uncomplicated: Secondary | ICD-10-CM | POA: Diagnosis not present

## 2015-06-19 DIAGNOSIS — Z793 Long term (current) use of hormonal contraceptives: Secondary | ICD-10-CM | POA: Diagnosis not present

## 2015-06-19 DIAGNOSIS — F32A Depression, unspecified: Secondary | ICD-10-CM

## 2015-06-19 DIAGNOSIS — F259 Schizoaffective disorder, unspecified: Secondary | ICD-10-CM | POA: Diagnosis not present

## 2015-06-19 DIAGNOSIS — Z8719 Personal history of other diseases of the digestive system: Secondary | ICD-10-CM | POA: Diagnosis not present

## 2015-06-19 DIAGNOSIS — F1910 Other psychoactive substance abuse, uncomplicated: Secondary | ICD-10-CM

## 2015-06-19 DIAGNOSIS — F4310 Post-traumatic stress disorder, unspecified: Secondary | ICD-10-CM | POA: Insufficient documentation

## 2015-06-19 DIAGNOSIS — Z880 Allergy status to penicillin: Secondary | ICD-10-CM | POA: Diagnosis not present

## 2015-06-19 DIAGNOSIS — F1510 Other stimulant abuse, uncomplicated: Secondary | ICD-10-CM | POA: Diagnosis not present

## 2015-06-19 DIAGNOSIS — Z046 Encounter for general psychiatric examination, requested by authority: Secondary | ICD-10-CM | POA: Diagnosis present

## 2015-06-19 DIAGNOSIS — L988 Other specified disorders of the skin and subcutaneous tissue: Secondary | ICD-10-CM | POA: Diagnosis not present

## 2015-06-19 DIAGNOSIS — Z7982 Long term (current) use of aspirin: Secondary | ICD-10-CM | POA: Insufficient documentation

## 2015-06-19 DIAGNOSIS — Z79899 Other long term (current) drug therapy: Secondary | ICD-10-CM | POA: Diagnosis not present

## 2015-06-19 DIAGNOSIS — F17210 Nicotine dependence, cigarettes, uncomplicated: Secondary | ICD-10-CM | POA: Diagnosis not present

## 2015-06-19 HISTORY — DX: Gender identity disorder, unspecified: F64.9

## 2015-06-19 LAB — COMPREHENSIVE METABOLIC PANEL
ALT: 34 U/L (ref 17–63)
AST: 31 U/L (ref 15–41)
Albumin: 3.9 g/dL (ref 3.5–5.0)
Alkaline Phosphatase: 53 U/L (ref 38–126)
Anion gap: 7 (ref 5–15)
BILIRUBIN TOTAL: 0.4 mg/dL (ref 0.3–1.2)
BUN: 10 mg/dL (ref 6–20)
CALCIUM: 9.3 mg/dL (ref 8.9–10.3)
CHLORIDE: 104 mmol/L (ref 101–111)
CO2: 26 mmol/L (ref 22–32)
CREATININE: 0.66 mg/dL (ref 0.61–1.24)
Glucose, Bld: 98 mg/dL (ref 65–99)
Potassium: 4.5 mmol/L (ref 3.5–5.1)
Sodium: 137 mmol/L (ref 135–145)
TOTAL PROTEIN: 7.2 g/dL (ref 6.5–8.1)

## 2015-06-19 LAB — ACETAMINOPHEN LEVEL: Acetaminophen (Tylenol), Serum: <10 ug/mL — ABNORMAL LOW (ref 10–30)

## 2015-06-19 LAB — CBC
HCT: 36.9 % — ABNORMAL LOW (ref 39.0–52.0)
Hemoglobin: 12.8 g/dL — ABNORMAL LOW (ref 13.0–17.0)
MCH: 28.9 pg (ref 26.0–34.0)
MCHC: 34.7 g/dL (ref 30.0–36.0)
MCV: 83.3 fL (ref 78.0–100.0)
PLATELETS: 188 10*3/uL (ref 150–400)
RBC: 4.43 MIL/uL (ref 4.22–5.81)
RDW: 13.2 % (ref 11.5–15.5)
WBC: 6.4 10*3/uL (ref 4.0–10.5)

## 2015-06-19 LAB — SALICYLATE LEVEL

## 2015-06-19 LAB — RAPID URINE DRUG SCREEN, HOSP PERFORMED
AMPHETAMINES: POSITIVE — AB
Barbiturates: NOT DETECTED
Benzodiazepines: POSITIVE — AB
Cocaine: NOT DETECTED
OPIATES: NOT DETECTED
Tetrahydrocannabinol: POSITIVE — AB

## 2015-06-19 LAB — ETHANOL

## 2015-06-19 MED ORDER — ALUM & MAG HYDROXIDE-SIMETH 200-200-20 MG/5ML PO SUSP: 30.0000 mL | ORAL | Status: DC | PRN

## 2015-06-19 MED ORDER — LORAZEPAM 1 MG PO TABS
1.0000 mg | ORAL_TABLET | Freq: Three times a day (TID) | ORAL | Status: DC | PRN
  Administered 2015-06-19 – 2015-06-20 (×3): 1 mg via ORAL
  Filled 2015-06-19 (×3): qty 1

## 2015-06-19 MED ORDER — ACETAMINOPHEN 325 MG PO TABS
650.0000 mg | ORAL_TABLET | ORAL | Status: DC | PRN
  Administered 2015-06-19: 650 mg via ORAL
  Filled 2015-06-19: qty 2

## 2015-06-19 MED ORDER — ONDANSETRON HCL 4 MG PO TABS: 4.0000 mg | ORAL_TABLET | Freq: Three times a day (TID) | ORAL | Status: DC | PRN

## 2015-06-19 MED ORDER — IBUPROFEN 200 MG PO TABS: 600.0000 mg | ORAL_TABLET | Freq: Three times a day (TID) | ORAL | Status: DC | PRN

## 2015-06-19 NOTE — ED Notes (Signed)
Per IVC paperwork, states husband took out commitment papers because he is suicidal-going through gender changes, not taking psyche meds
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
## 2015-06-20 ENCOUNTER — Inpatient Hospital Stay (HOSPITAL_COMMUNITY)
Admission: EM | Admit: 2015-06-20 | Discharge: 2015-06-29 | DRG: 897 | Disposition: A | Discharge status: Home / Self Care | Payer: Medicare Other | Source: Intra-hospital | Attending: Psychiatry | Admitting: Psychiatry

## 2015-06-20 ENCOUNTER — Encounter (HOSPITAL_COMMUNITY): Payer: Self-pay | Admitting: *Deleted

## 2015-06-20 DIAGNOSIS — F17210 Nicotine dependence, cigarettes, uncomplicated: Secondary | ICD-10-CM | POA: Diagnosis present

## 2015-06-20 DIAGNOSIS — F329 Major depressive disorder, single episode, unspecified: Secondary | ICD-10-CM | POA: Diagnosis present

## 2015-06-20 DIAGNOSIS — F1320 Sedative, hypnotic or anxiolytic dependence, uncomplicated: Secondary | ICD-10-CM | POA: Diagnosis not present

## 2015-06-20 DIAGNOSIS — F1590 Other stimulant use, unspecified, uncomplicated: Secondary | ICD-10-CM | POA: Diagnosis not present

## 2015-06-20 DIAGNOSIS — F11222 Opioid dependence with intoxication with perceptual disturbance: Secondary | ICD-10-CM | POA: Diagnosis present

## 2015-06-20 DIAGNOSIS — F1920 Other psychoactive substance dependence, uncomplicated: Secondary | ICD-10-CM | POA: Diagnosis present

## 2015-06-20 DIAGNOSIS — F1120 Opioid dependence, uncomplicated: Secondary | ICD-10-CM

## 2015-06-20 DIAGNOSIS — F1520 Other stimulant dependence, uncomplicated: Secondary | ICD-10-CM | POA: Diagnosis present

## 2015-06-20 DIAGNOSIS — F19288 Other psychoactive substance dependence with other psychoactive substance-induced disorder: Secondary | ICD-10-CM

## 2015-06-20 MED ORDER — ATORVASTATIN CALCIUM 20 MG PO TABS
20.0000 mg | ORAL_TABLET | Freq: Every day | ORAL | Status: DC
  Administered 2015-06-20 – 2015-06-29 (×10): 20 mg via ORAL
  Filled 2015-06-20 (×11): qty 1
  Filled 2015-06-20: qty 2

## 2015-06-20 MED ORDER — FISH OIL 1000 MG PO CAPS
3000.0000 mg | ORAL_CAPSULE | Freq: Every day | ORAL | Status: DC
  Administered 2015-06-20 – 2015-06-22 (×3): 3000 mg via ORAL
  Filled 2015-06-20 (×5): qty 3

## 2015-06-20 MED ORDER — HYDROXYZINE HCL 25 MG PO TABS: 25.0000 mg | ORAL_TABLET | Freq: Four times a day (QID) | ORAL | Status: AC | PRN

## 2015-06-20 MED ORDER — ACETAMINOPHEN 325 MG PO TABS: 650.0000 mg | ORAL_TABLET | Freq: Four times a day (QID) | ORAL | Status: DC | PRN

## 2015-06-20 MED ORDER — GABAPENTIN 300 MG PO CAPS
300.0000 mg | ORAL_CAPSULE | Freq: Three times a day (TID) | ORAL | Status: DC
  Administered 2015-06-20 – 2015-06-29 (×26): 300 mg via ORAL
  Filled 2015-06-20 (×31): qty 1

## 2015-06-20 MED ORDER — TRAZODONE HCL 50 MG PO TABS
50.0000 mg | ORAL_TABLET | Freq: Every evening | ORAL | Status: DC | PRN
Start: 2015-06-20 — End: 2015-06-29
  Administered 2015-06-24: 50 mg via ORAL
  Filled 2015-06-20 (×3): qty 1

## 2015-06-20 MED ORDER — CHLORDIAZEPOXIDE HCL 25 MG PO CAPS
25.0000 mg | ORAL_CAPSULE | Freq: Four times a day (QID) | ORAL | Status: DC
  Administered 2015-06-20 – 2015-06-21 (×4): 25 mg via ORAL
  Filled 2015-06-20 (×4): qty 1

## 2015-06-20 MED ORDER — ASPIRIN EC 81 MG PO TBEC
81.0000 mg | DELAYED_RELEASE_TABLET | Freq: Every day | ORAL | Status: DC
  Administered 2015-06-20 – 2015-06-29 (×10): 81 mg via ORAL
  Filled 2015-06-20 (×12): qty 1

## 2015-06-20 MED ORDER — ESTRADIOL 0.05 MG/24HR TD PTWK
0.0500 mg | MEDICATED_PATCH | TRANSDERMAL | Status: DC
  Administered 2015-06-20: 0.05 mg via TRANSDERMAL
  Filled 2015-06-20 (×2): qty 1

## 2015-06-20 MED ORDER — CHLORDIAZEPOXIDE HCL 25 MG PO CAPS: 25.0000 mg | ORAL_CAPSULE | Freq: Every day | ORAL | Status: DC

## 2015-06-20 MED ORDER — CHLORDIAZEPOXIDE HCL 25 MG PO CAPS: 25.0000 mg | ORAL_CAPSULE | Freq: Three times a day (TID) | ORAL | Status: DC

## 2015-06-20 MED ORDER — PANTOPRAZOLE SODIUM 40 MG PO TBEC
40.0000 mg | DELAYED_RELEASE_TABLET | Freq: Every day | ORAL | Status: DC
  Administered 2015-06-20 – 2015-06-29 (×10): 40 mg via ORAL
  Filled 2015-06-20 (×12): qty 1

## 2015-06-20 MED ORDER — NICOTINE 21 MG/24HR TD PT24
21.0000 mg | MEDICATED_PATCH | Freq: Every day | TRANSDERMAL | Status: DC
  Administered 2015-06-22: 21 mg via TRANSDERMAL
  Filled 2015-06-20 (×7): qty 1

## 2015-06-20 MED ORDER — ONDANSETRON 4 MG PO TBDP: 4.0000 mg | ORAL_TABLET | Freq: Four times a day (QID) | ORAL | Status: AC | PRN

## 2015-06-20 MED ORDER — MAGNESIUM HYDROXIDE 400 MG/5ML PO SUSP: 30.0000 mL | Freq: Every day | ORAL | Status: DC | PRN

## 2015-06-20 MED ORDER — CHLORDIAZEPOXIDE HCL 25 MG PO CAPS
50.0000 mg | ORAL_CAPSULE | Freq: Once | ORAL | Status: AC
  Administered 2015-06-20: 50 mg via ORAL

## 2015-06-20 MED ORDER — CHLORDIAZEPOXIDE HCL 25 MG PO CAPS: 25.0000 mg | ORAL_CAPSULE | ORAL | Status: DC

## 2015-06-20 MED ORDER — HYDROXYZINE HCL 25 MG PO TABS
25.0000 mg | ORAL_TABLET | Freq: Four times a day (QID) | ORAL | Status: DC
  Administered 2015-06-20 – 2015-06-29 (×29): 25 mg via ORAL
  Filled 2015-06-20 (×47): qty 1

## 2015-06-20 MED ORDER — HYDROXYZINE HCL 25 MG PO TABS: 25.0000 mg | ORAL_TABLET | Freq: Four times a day (QID) | ORAL | Status: DC | PRN

## 2015-06-20 MED ORDER — ADULT MULTIVITAMIN W/MINERALS CH
1.0000 | ORAL_TABLET | Freq: Every day | ORAL | Status: DC
  Administered 2015-06-20 – 2015-06-29 (×10): 1 via ORAL
  Filled 2015-06-20 (×12): qty 1

## 2015-06-20 MED ORDER — FINASTERIDE 5 MG PO TABS
10.0000 mg | ORAL_TABLET | Freq: Every day | ORAL | Status: DC
  Administered 2015-06-20 – 2015-06-29 (×10): 10 mg via ORAL
  Filled 2015-06-20 (×11): qty 2

## 2015-06-20 MED ORDER — THIAMINE HCL 100 MG/ML IJ SOLN
100.0000 mg | Freq: Once | INTRAMUSCULAR | Status: AC
  Administered 2015-06-20: 100 mg via INTRAMUSCULAR
  Filled 2015-06-20: qty 2

## 2015-06-20 MED ORDER — EMTRICITABINE-TENOFOVIR AF 200-25 MG PO TABS
1.0000 | ORAL_TABLET | Freq: Every day | ORAL | Status: DC
  Administered 2015-06-20: 1 via ORAL
  Filled 2015-06-20 (×3): qty 1

## 2015-06-20 MED ORDER — ALUM & MAG HYDROXIDE-SIMETH 200-200-20 MG/5ML PO SUSP
30.0000 mL | ORAL | Status: DC | PRN
Start: 2015-06-20 — End: 2015-06-29

## 2015-06-20 MED ORDER — VITAMIN B-1 100 MG PO TABS
100.0000 mg | ORAL_TABLET | Freq: Every day | ORAL | Status: DC
  Administered 2015-06-21 – 2015-06-29 (×9): 100 mg via ORAL
  Filled 2015-06-20 (×11): qty 1

## 2015-06-20 MED ORDER — CHLORDIAZEPOXIDE HCL 25 MG PO CAPS: 25.0000 mg | ORAL_CAPSULE | Freq: Four times a day (QID) | ORAL | Status: DC | PRN

## 2015-06-20 MED ORDER — ARIPIPRAZOLE 10 MG PO TABS
10.0000 mg | ORAL_TABLET | Freq: Every day | ORAL | Status: DC
  Administered 2015-06-20 – 2015-06-29 (×10): 10 mg via ORAL
  Filled 2015-06-20 (×12): qty 1

## 2015-06-20 MED ORDER — LOPERAMIDE HCL 2 MG PO CAPS: 2.0000 mg | ORAL_CAPSULE | ORAL | Status: AC | PRN

## 2015-06-20 NOTE — BH Assessment (Signed)
[facility]   Clinician informed by Bunnie PionAC Tori [facility] to Dr. Dub MikesLugo.  Patient can't come until daytime AC notifies [facility] of bed availability.
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
## 2015-06-20 NOTE — Progress Notes (Signed)
DAR NOTE: Introduced self to patient.  Medication given as prescribed.  Patient is alert and cooperative.  Affect is flat.  Patient in his room sleeping.  Denies auditory and visual hallucination.  Denies withdrawal symptoms.  Routine safety checks maintained.  Support and encouragement offered as needed.

## 2015-06-20 NOTE — Progress Notes (Signed)
D: Patient alert and cooperative. Pt here for substance abuse c/o tremors, sweats and anxiety. Pt mood/affect appeared depressed and flat. Pt in room all evening sleeping. Pt denies SI/HI/AVH.  A: Medications administered as prescribed. Emotional support given and will continue to monitor pt's progress for stabilization. R: Patient remains safe and complaint with medications.

## 2015-06-20 NOTE — ED Notes (Signed)
Patient had cell phone under sheets.  Surrendered without incident and secured in locker 36.

## 2015-06-20 NOTE — Progress Notes (Signed)
Patient ID: Joshua Davila, male   DOB: 29-May-1980, 35 y.o.   MRN: [id number] PER STATE REGULATIONS 482.30  THIS CHART WAS REVIEWED FOR MEDICAL NECESSITY WITH RESPECT TO THE PATIENT'S ADMISSION/DURATION OF STAY.  NEXT REVIEW DATE:06/24/15  Loura HaltBARBARA Trenton Passow, RN, BSN CASE MANAGER

## 2015-06-20 NOTE — ED Notes (Signed)
Pt came to desk reporting anxiety 8/10 and reporting that he has been taking 2-1mg  Xanax bars BID for last 2-3 weeks. PRN medication provided

## 2015-06-20 NOTE — Tx Team (Signed)
Initial Interdisciplinary Treatment Plan   PATIENT STRESSORS: Financial difficulties Marital or family conflict Medication change or noncompliance Substance abuse   PATIENT STRENGTHS: Ability for insight Communication skills Motivation for treatment/growth Supportive family/friends   PROBLEM LIST: Problem List/Patient Goals Date to be addressed Date deferred Reason deferred Estimated date of resolution  Substance abuse 06/20/2015  06/20/2015   D/C  Depression 06/20/2015  06/20/2015   D/C  Suicide Risk 06/20/2015  06/20/2015   D/C  "Meds to lessen cravings" 06/20/2015  06/20/2015   D/C  "evaluate current living arrangements" 06/20/2015  06/20/2015   D/C                           DISCHARGE CRITERIA:  Ability to meet basic life and health needs Adequate post-discharge living arrangements Improved stabilization in mood, thinking, and/or behavior Medical problems require only outpatient monitoring Need for constant or close observation no longer present Withdrawal symptoms are absent or subacute and managed without 24-hour nursing intervention  PRELIMINARY DISCHARGE PLAN: Attend 12-step recovery group Outpatient therapy Return to previous living arrangement  PATIENT/FAMIILY INVOLVEMENT: This treatment plan has been presented to and reviewed with the patient, Joshua Davila.  The patient and family have been given the opportunity to ask questions and make suggestions.  Larry SierrasMiddleton, Madie Cahn P 06/20/2015, 7:11 PM

## 2015-06-20 NOTE — H&P (Signed)
Psychiatric Admission Assessment Adult  Patient Identification: Joshua Davila  MRN:  [id number]  Date of Evaluation:  06/20/2015  Chief Complaint: Substance withdrawal symptoms/Substance induced mood disorder  Principal Diagnosis: Polysubstance dependence, Substance induced mood disorder, PTSD,   Diagnosis:   Patient Active Problem List   Diagnosis Date Noted  . Benzodiazepine dependence ([place]) [F13.20] 10/04/2013  . Cocaine dependence ([place]) [F14.20] 10/04/2013  . Opioid abuse with opioid-induced mood disorder ([place]) [F11.14] 10/04/2013  . PTSD (post-traumatic stress disorder) [F43.10] 10/03/2013  . Substance induced mood disorder ([place]) [F19.94] 10/03/2013  . Polysubstance dependence [facility]) [F19.20] 10/02/2013   History of Present Illness: Joshua Davila is a 35 year old Caucasian male.  Admitted to the [facility] adult unit from the [facility] under IVC with reports that patient was suicidal & had not been taking his psychotropic medications. He was also using multiple drugs. During this assessment, Joshua Davila is observed; restless, disheveled & verbally responsive. There are open skin areas to his arms & face. Joshua Davila reports that he has the habit of picking on his skin. He reports, "I was taken to the hospital yesterday by the police. Apparently, my husband went to the [place] office, signed some papers to commit me to the mental health hospital. He told them that I said that I was going to hurt him. I did not threaten to hurt him or myself. I was just going through bad withdrawals from [place] methamphetamine, Xanax & Wax. I have been using heavily for over 6 months. I started using drugs at the age of 64. I'm taking hormone therapy for my gender re-assignment. I was recently approved for disability. I will need to resume my; Abilify, Sertraline, Truvada, Hormone therapy & Neurontin. I do not want to resume Wellbutrin. I do not want to see my husband anytime soon".     Associated  Signs/Symptoms:  Depression Symptoms:  depressed mood, insomnia, anxiety, loss of energy/fatigue, weight loss,  (Hypo) Manic Symptoms:  Impulsivity,  Anxiety Symptoms:  Excessive Worry,  Psychotic Symptoms:  Denies any hallucinations, delusions or paranoia  PTSD Symptoms: [employer], research (life sciences). Re-experiencing:  Flashbacks Nightmares  Total Time spent with patient: 1 hour  Past Psychiatric History: Schizoaffective disorder, Polysubstance dependence, PTSD  Is the patient at risk to self? No.  Has the patient been a risk to self in the past 6 months? No.  Has the patient been a risk to self within the distant past? Yes.    Is the patient a risk to others? No.  Has the patient been a risk to others in the past 6 months? No.  Has the patient been a risk to others within the distant past? No.   Prior Inpatient Therapy: Yes [facility] 2015) Prior Outpatient Therapy: Yes, ([place].  Alcohol Screening: Denies use of alcohol Substance Abuse History in the last 12 months:  Yes.    Consequences of Substance Abuse: Medical Consequences:  Liver damage, Possible death by overdose Legal Consequences:  Arrests, jail time, Loss of driving privilege. Family Consequences:  Family discord, divorce and or separation.  Previous Psychotropic Medications: Yes   Psychological Evaluations: Yes   Past Medical History:  Past Medical History  Diagnosis Date  . Schizoaffective disorder ([place])   . PTSD (post-traumatic stress disorder)   . Depression   . Liver disorder   . Gender identity disorder    No past surgical history on file.  Family History:  Family History  Problem Relation Age of Onset  . Multiple sclerosis Mother  Family Psychiatric  History: Denies any familial hx of mental health issues of substance use/abuse   Tobacco Screening: Smokes a bout a pack of cigarettes daily  Social History:  History  Alcohol Use  . Yes    Comment: occasional     History  Drug Use  . Yes  .  Special: Cocaine, Methamphetamines, Marijuana    Comment: last useof methamphetamines and marijuana today.    Additional Social History:  Allergies:   Allergies  Allergen Reactions  . Penicillins Rash  . Sulfa Antibiotics Rash   Lab Results:  Results for orders placed or performed during the hospital encounter of 06/19/15 (from the past 48 hour(s))  Comprehensive metabolic panel     Status: None   Collection Time: 06/19/15  6:24 PM  Result Value Ref Range   Sodium 137 135 - 145 mmol/L   Potassium 4.5 3.5 - 5.1 mmol/L   Chloride 104 101 - 111 mmol/L   CO2 26 22 - 32 mmol/L   Glucose, Bld 98 65 - 99 mg/dL   BUN 10 6 - 20 mg/dL   Creatinine, Ser 0.66 0.61 - 1.24 mg/dL   Calcium 9.3 8.9 - 10.3 mg/dL   Total Protein 7.2 6.5 - 8.1 g/dL   Albumin 3.9 3.5 - 5.0 g/dL   AST 31 15 - 41 U/L   ALT 34 17 - 63 U/L   Alkaline Phosphatase 53 38 - 126 U/L   Total Bilirubin 0.4 0.3 - 1.2 mg/dL   GFR calc non Af Amer >60 >60 mL/min   GFR calc Af Amer >60 >60 mL/min    Comment: (NOTE) The eGFR has been calculated using the CKD EPI equation. This calculation has not been validated in all clinical situations. eGFR's persistently <60 mL/min signify possible Chronic Kidney Disease.    Anion gap 7 5 - 15  CBC     Status: Abnormal   Collection Time: 06/19/15  6:24 PM  Result Value Ref Range   WBC 6.4 4.0 - 10.5 K/uL   RBC 4.43 4.22 - 5.81 MIL/uL   Hemoglobin 12.8 (L) 13.0 - 17.0 g/dL   HCT 36.9 (L) 39.0 - 52.0 %   MCV 83.3 78.0 - 100.0 fL   MCH 28.9 26.0 - 34.0 pg   MCHC 34.7 30.0 - 36.0 g/dL   RDW 13.2 11.5 - 15.5 %   Platelets 188 150 - 400 K/uL  Ethanol (ETOH)     Status: None   Collection Time: 06/19/15  6:25 PM  Result Value Ref Range   Alcohol, Ethyl (B) <5 <5 mg/dL    Comment:        LOWEST DETECTABLE LIMIT FOR SERUM ALCOHOL IS 5 mg/dL FOR MEDICAL PURPOSES ONLY   Salicylate level     Status: None   Collection Time: 06/19/15  6:25 PM  Result Value Ref Range   Salicylate Lvl  <6.5 2.8 - 30.0 mg/dL  Acetaminophen level     Status: Abnormal   Collection Time: 06/19/15  6:25 PM  Result Value Ref Range   Acetaminophen (Tylenol), Serum <10 (L) 10 - 30 ug/mL    Comment:        THERAPEUTIC CONCENTRATIONS VARY SIGNIFICANTLY. A RANGE OF 10-30 ug/mL MAY BE AN EFFECTIVE CONCENTRATION FOR MANY PATIENTS. HOWEVER, SOME ARE BEST TREATED AT CONCENTRATIONS OUTSIDE THIS RANGE. ACETAMINOPHEN CONCENTRATIONS >150 ug/mL AT 4 HOURS AFTER INGESTION AND >50 ug/mL AT 12 HOURS AFTER INGESTION ARE OFTEN ASSOCIATED WITH TOXIC REACTIONS.   Urine rapid drug screen (  hosp performed) (Not at [facility])     Status: Abnormal   Collection Time: 06/19/15  7:13 PM  Result Value Ref Range   Opiates NONE DETECTED NONE DETECTED   Cocaine NONE DETECTED NONE DETECTED   Benzodiazepines POSITIVE (A) NONE DETECTED   Amphetamines POSITIVE (A) NONE DETECTED   Tetrahydrocannabinol POSITIVE (A) NONE DETECTED   Barbiturates NONE DETECTED NONE DETECTED    Comment:        DRUG SCREEN FOR MEDICAL PURPOSES ONLY.  IF CONFIRMATION IS NEEDED FOR ANY PURPOSE, NOTIFY LAB WITHIN 5 DAYS.        LOWEST DETECTABLE LIMITS FOR URINE DRUG SCREEN Drug Class       Cutoff (ng/mL) Amphetamine      1000 Barbiturate      200 Benzodiazepine   094 Tricyclics       709 Opiates          300 Cocaine          300 THC              50    Blood Alcohol level:  Lab Results  Component Value Date   ETH <5 06/19/2015   ETH <11 62/83/6629   Metabolic Disorder Labs:  No results found for: HGBA1C, MPG No results found for: PROLACTIN No results found for: CHOL, TRIG, HDL, CHOLHDL, VLDL, LDLCALC  Current Medications: No current facility-administered medications for this encounter.   PTA Medications: Prescriptions prior to admission  Medication Sig Dispense Refill Last Dose  . ARIPiprazole (ABILIFY) 10 MG tablet Take 1 tablet (10 mg total) by mouth daily. For mood control 30 tablet 0 Past Month at Unknown time  .  aspirin EC 81 MG tablet Take 1 tablet (81 mg total) by mouth daily. For heart health   Past Month at Unknown time  . atorvastatin (LIPITOR) 40 MG tablet Take 0.5 tablets (20 mg total) by mouth daily. For high cholesterol   Past Month at Unknown time  . buPROPion (WELLBUTRIN XL) 150 MG 24 hr tablet Take 1 tablet (150 mg total) by mouth daily. For depression (Patient not taking: Reported on 06/19/2015) 30 tablet 0 Completed Course at Unknown time  . emtricitabine-tenofovir (TRUVADA) 200-300 MG per tablet Take 1 tablet by mouth daily. For pre-exposure to HIV   Past Month at Unknown time  . estradiol (ALORA) 0.05 MG/24HR patch Place 1 patch (0.05 mg total) onto the skin daily. For male hormone replacement 8 patch 12 Past Week at Unknown time  . finasteride (PROSCAR) 5 MG tablet Take 2 tablets (10 mg total) by mouth daily. For enlarged prostate   06/19/2015 at Unknown time  . gabapentin (NEURONTIN) 300 MG capsule Take 1 capsule (300 mg total) by mouth 3 (three) times daily. For substance withdrawal syndrome/pain (Patient taking differently: Take 600 mg by mouth 3 (three) times daily. For substance withdrawal syndrome/pain) 90 capsule 0 Past Month at Unknown time  . hydrocortisone 2.5 % cream Apply 1 application topically daily. For itching (Patient not taking: Reported on 06/19/2015) 30 g 0 Completed Course at Unknown time  . hydrOXYzine (ATARAX/VISTARIL) 25 MG tablet Take 25 mg three times daily as needed: For anxiety (Patient not taking: Reported on 02/09/2014) 30 tablet 0 Not Taking at Unknown time  . hydrOXYzine (ATARAX/VISTARIL) 25 MG tablet Take 1 tablet (25 mg total) by mouth every 6 (six) hours. Prn itching (Patient not taking: Reported on 06/15/2014) 20 tablet 0 Not Taking at Unknown time  . ibuprofen (ADVIL,MOTRIN) 600 MG tablet Take  1 tablet (600 mg total) by mouth every 8 (eight) hours as needed. (Patient not taking: Reported on 06/19/2015) 15 tablet 0 Completed Course at Unknown time  . meloxicam  (MOBIC) 15 MG tablet Take 1 tablet (15 mg total) by mouth daily. (Patient not taking: Reported on 06/19/2015) 14 tablet 0 Completed Course at Unknown time  . MINOXIDIL, TOPICAL, (MINOXIDIL FOR MEN) 5 % SOLN Apply 1 application topically 2 (two) times daily. For thinning hair (Patient not taking: Reported on 06/19/2015)  0 Completed Course at Unknown time  . Omega-3 Fatty Acids (FISH OIL) 1000 MG CAPS Take 3 capsules (3,000 mg total) by mouth daily. For high cholesterol  0 unknown  . pantoprazole (PROTONIX) 40 MG tablet Take 1 tablet (40 mg total) by mouth daily. For acid reflux (Patient not taking: Reported on 06/19/2015)   Completed Course at Unknown time  . PARoxetine (PAXIL) 20 MG tablet Take 1 tablet (20 mg total) by mouth daily. For depression 30 tablet 0 unknown   Musculoskeletal: Strength & Muscle Tone: within normal limits Gait & Station: normal Patient leans: N/A  Psychiatric Specialty Exam: Physical Exam  Constitutional: He is oriented to person, place, and time. He appears well-developed.  HENT:  Head: Normocephalic.  Eyes: Pupils are equal, round, and reactive to light.  Neck: Normal range of motion.  Cardiovascular: Normal rate.   Respiratory: Effort normal.  GI: Soft.  Genitourinary:  Hx. Enlarged prostate  Musculoskeletal: Normal range of motion.  Neurological: He is alert and oriented to person, place, and time.  Skin: Skin is warm and dry.  Psychiatric: His speech is normal and behavior is normal. Thought content normal. His mood appears anxious. His affect is not angry, not blunt, not labile and not inappropriate. Cognition and memory are impaired. He expresses impulsivity. He exhibits a depressed mood.    Review of Systems  Constitutional: Positive for chills, weight loss, malaise/fatigue and diaphoresis.  HENT: Negative.   Eyes: Negative.   Respiratory: Negative.   Cardiovascular: Negative.   Gastrointestinal: Positive for nausea.  Genitourinary: Negative.    Musculoskeletal: Positive for myalgias and joint pain.  Skin:       Scattered open spots to skin areas (arms, back of hands & facial ares)  Neurological: Positive for tremors and weakness.  Endo/Heme/Allergies: Negative.   Psychiatric/Behavioral: Positive for depression (Rates #10) and substance abuse (Polysubstance dependence). Negative for suicidal ideas (Denies), hallucinations (Denies) and memory loss. The patient is nervous/anxious (Restless) and has insomnia.     Blood pressure 113/83, pulse 105, temperature 98.3 F (36.8 C), temperature source Oral, resp. rate 16, height 5' 9.5" (1.765 m), weight 70.308 kg (155 lb).Body mass index is 22.57 kg/(m^2).  General Appearance: Disheveled, restless, open spots to skin areas  Eye Contact::  Poor  Speech:  Clear, Coherent, normal rate  Volume:  Normal  Mood:  Depressed, anxious  Affect:  Restricted  Thought Process:  Coherent, Intact and Logical  Orientation:  Full (Time, Place, and Person)  Thought Content:  Rumination, denies any hallucinations, delusions or paranoia.  Suicidal Thoughts:  Denies any suicidal thoughts  Homicidal Thoughts:  Denies any homicidal thoughts  Memory:  Immediate;   Good Recent;   Good Remote;   Good  Judgement:  Impaired  Insight:  Fair  Psychomotor Activity:  Restlessness and Tremor  Concentration:  Poor  Recall:  [place]  Language: Good  Akathisia:  No  Handed:  Right  AIMS (if indicated):     Assets:  Desire for Improvement  ADL's:  Intact  Cognition: WNL  Sleep:      Treatment Plan/Recommendations: 1. Admit for crisis management and stabilization, estimated length of stay 3-5 days.  2. Medication management to reduce current symptoms to base line and improve the patient's overall level of functioning; Resume Abilify 10 mg for mood control, Gabapentin 300 mg for agitation, Trazodone 50 mg for insomnia,  3. Treat health problems as indicated.  4. Develop treatment plan to  decrease risk of relapse upon discharge and the need for readmission.  5. Psycho-social education regarding relapse prevention and self care.  6. Health care follow up as needed for medical problems.  7. Review, reconcile, and reinstate any pertinent home medications for other health issues where appropriate; Lipitor 20 mg for high cholesterol, ASA 81 mg for heart health, Proscar 10 mg BPH, Fish oil 300 mg for high cholesterol, Hydroxyzine 25 mg foranxiety, TRUVADA for HIV prevention . 8. Call for consults with hospitalist for any additional specialty patient care services as needed.  Observation Level/Precautions:  15 minute checks  Laboratory:  Per ED; UDS (+) for Amphetamine, Benzodiazepine  Psychotherapy: Group sessions   Medications: Resume Abilify 10 mg for mood control, Gabapentin 300 mg for agitation, Trazodone 50 mg for insomnia, Lipitor 20 mg for high cholesterol, ASA 81 mg for heart health, Proscar 10 mg BPH, Fish oil 300 mg for high cholesterol, Hydroxyzine 25 mg for anxiety, TRUVADA for HIV prevention  Consultations: As Needed  Discharge Concerns:  Safety, mood stabilization  Estimated LOS: 2-4 days  Other: Admit to [facility].   I certify that inpatient services furnished can reasonably be expected to improve the patient's condition.    Encarnacion Slates, NP, PMHNP-BC 3/30/20172:35 PM I personally assessed the patient, reviewed the physical exam and labs and formulated the treatment plan Geralyn Flash A. Sabra Heck, M.D.

## 2015-06-20 NOTE — Progress Notes (Addendum)
Admission Note:  D- [age]yr old male who presents IVC in no acute distress for the treatment of Substance Abuse.  Patient reports that he is currently transitioning to male and has started hormone therapy. On admission, patient appears flat, depressed, and tearful. Patient was cooperative with admission process.  Patient was IVC'D by his husband.  Patient reports that while he was watching tv he noticed his husband cleaning up the drugs in the house.  Patient reports that husband told him that husband's mother was coming to visit and instead the cops showed up with handcuffs.  Patient reports that patient's husband had threatened multiple times to have patient committed to the hospital for "laziness and helping out around the house".  Patient reports marital issues, crying spells, and depression.  Patient reports drug use; Xanax bars, marijuana wax, THC, and crystal meth.  Patient reports no significant medical hx but states that his husband is HIV positive and patient is unsure of patient's current status.  Patient reports prior suicide attempt via Overdose "300 of Benadryl" when he first found out that husband was HIV positive.  Patient currently denies SI/HI/AVH.  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    A-Skin was assessed.  Patient has scabs all over his body to include face, upper and lower extremities, back, chest, abdomen.  Patient reports scabs are due to taking crystal meth and scratching.  Patient has bumps on pelvic region and identifies them as "razor bumps".  Patient has multiple tattoos; leg leg x 3, left arm, right arm, and back of neck.  Patient searched and no contraband found, POC and unit policies explained and understanding verbalized. Consents obtained. R- Patient had no additional questions or concerns.

## 2015-06-21 MED ORDER — LORAZEPAM 2 MG/ML IJ SOLN
2.0000 mg | Freq: Once | INTRAMUSCULAR | Status: AC
  Administered 2015-06-21: 2 mg via INTRAMUSCULAR
  Filled 2015-06-21: qty 1

## 2015-06-21 MED ORDER — LORAZEPAM 1 MG PO TABS
1.0000 mg | ORAL_TABLET | Freq: Two times a day (BID) | ORAL | Status: AC
  Administered 2015-06-23 – 2015-06-24 (×2): 1 mg via ORAL
  Filled 2015-06-21 (×2): qty 1

## 2015-06-21 MED ORDER — EMTRICITABINE-TENOFOVIR DF 200-300 MG PO TABS
1.0000 | ORAL_TABLET | Freq: Every day | ORAL | Status: DC
  Administered 2015-06-21 – 2015-06-29 (×9): 1 via ORAL
  Filled 2015-06-21 (×10): qty 1

## 2015-06-21 MED ORDER — LORAZEPAM 1 MG PO TABS: 1.0000 mg | ORAL_TABLET | Freq: Four times a day (QID) | ORAL | Status: AC | PRN

## 2015-06-21 MED ORDER — LORAZEPAM 1 MG PO TABS
1.0000 mg | ORAL_TABLET | Freq: Three times a day (TID) | ORAL | Status: AC
  Administered 2015-06-22 – 2015-06-23 (×3): 1 mg via ORAL
  Filled 2015-06-21 (×3): qty 1

## 2015-06-21 MED ORDER — LORAZEPAM 1 MG PO TABS
1.0000 mg | ORAL_TABLET | Freq: Every day | ORAL | Status: AC
  Administered 2015-06-25: 1 mg via ORAL
  Filled 2015-06-21: qty 1

## 2015-06-21 MED ORDER — LORAZEPAM 1 MG PO TABS
1.0000 mg | ORAL_TABLET | Freq: Four times a day (QID) | ORAL | Status: AC
  Administered 2015-06-21 – 2015-06-22 (×3): 1 mg via ORAL
  Filled 2015-06-21 (×3): qty 1

## 2015-06-21 NOTE — Tx Team (Signed)
Interdisciplinary Treatment Plan Update (Adult) Date: 06/21/2015    Time Reviewed: 9:30 AM  Progress in Treatment: Attending groups: Continuing to assess, patient new to milieu Participating in groups: Continuing to assess, patient new to milieu Taking medication as prescribed: Yes Tolerating medication: Yes Family/Significant other contact made: No, CSW assessing for appropriate contacts Patient understands diagnosis: Yes Discussing patient identified problems/goals with staff: Yes Medical problems stabilized or resolved: Yes Denies suicidal/homicidal ideation: Yes Issues/concerns per patient self-inventory: Yes Other:  New problem(s) identified: N/A  Discharge Plan or Barriers: CSW continuing to assess, patient new to milieu.  Reason for Continuation of Hospitalization:  Depression Anxiety Medication Stabilization   Comments: N/A  Estimated length of stay: 3-5 days    Patient is a 35 year old transgender male with a diagnosis of Substance Induced Mood Disorder. Pt presented to the hospital IVC'd for threatening husband and substance abuse issues. Pt reports primary trigger(s) for admission was marital issues and substance abuse. Patient will benefit from crisis stabilization, medication evaluation, group therapy and psycho education in addition to case management for discharge planning. At discharge, it is recommended that Pt remain compliant with established discharge plan and continued treatment.   Review of initial/current patient goals per problem list:  1. Goal(s): Patient will participate in aftercare plan   Met: No   Target date: 3-5 days post admission date   As evidenced by: Patient will participate within aftercare plan AEB aftercare provider and housing plan at discharge being identified.   3/31: Goal not met: CSW assessing for appropriate referrals for pt and will have follow up secured prior to d/c.   2. Goal (s): Patient will exhibit decreased  depressive symptoms and suicidal ideations.   Met: No   Target date: 3-5 days post admission date   As evidenced by: Patient will utilize self rating of depression at 3 or below and demonstrate decreased signs of depression or be deemed stable for discharge by MD.  3/31: Goal not met: Pt presents with flat affect and depressed mood.  Pt admitted with depression rating of 10.  Pt to show decreased sign of depression and a rating of 3 or less before d/c.     3. Goal(s): Patient will demonstrate decreased signs and symptoms of anxiety.   Met: No   Target date: 3-5 days post admission date   As evidenced by: Patient will utilize self rating of anxiety at 3 or below and demonstrated decreased signs of anxiety, or be deemed stable for discharge by MD  3/31: Goal not met: Pt presents with anxious mood and affect.  Pt admitted with anxiety rating of 10.  Pt to show decreased sign of anxiety and a rating of 3 or less before d/c.    4. Goal(s): Patient will demonstrate decreased signs of withdrawal due to substance abuse   Met: Yes   Target date: 3-5 days post admission date   As evidenced by: Patient will produce a CIWA/COWS score of 0, have stable vitals signs, and no symptoms of withdrawal  3/31: Goal met. No withdrawal symptoms reported at this time per medical chart.   Attendees: Patient:   06/21/2015 9:30 AM   Family:   06/21/2015 9:30 AM   Physician:  Dr. Carlton Adam, MD 06/21/2015 9:30 AM   Nursing:  Soledad Gerlach Desma Paganini, RN 06/21/2015 9:30 AM   Clinical Social Worker: Maxie Better, LCSW 06/21/2015 9:30 AM   Clinical Social Worker: Erasmo Downer Lonny Eisen LCSW; Peri Maris LCSWA 06/21/2015  9:30 AM   Other:  Gerline Legacy Nurse Case Manager 06/21/2015 9:30 AM   Other:  Agustina Caroli, Samuel Jester, NP 06/21/2015 9:30 AM   Other:   06/21/2015 9:30 AM   Other: Norberto Sorenson, P4CC 06/21/2015 9:30 AM   Other:  06/21/2015 9:30 AM   Other:  06/21/2015 9:30 AM      Tilden Fossa, LCSW Clinical Social Worker [facility] [phone]
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
## 2015-06-21 NOTE — Progress Notes (Signed)
Patient ID: Joshua Davila, male   DOB: 08/03/1980, 35 y.o.   MRN: [id number]  Pt currently presents with a blunted affect and ambivalent behavior. Per self inventory, pt rates depression at a 9, hopelessness 9 and anxiety 10. Pt's daily goal is to "trying to get my bank account straight" and they intend to do so by "all I can, the best I can." Pt reports poor sleep, a poor appetite, low energy and poor concentration. Pt reports to writer "I would light that stuff up, If I could, I would use the big torch not just the small lighter." Pt reports cravings for crystal meth. Pt reports signs and symptoms of withdrawal including dizziness, nausea, cold chills, sedation and body aches. Pt remains in bed for most of that day today. Pt mood labile, pt irritable and then tearful.  Pt provided with medications per providers orders. Pt's labs and vitals were monitored throughout the day. Pt supported emotionally and encouraged to express concerns and questions. Pt educated on medications.  Pt's safety ensured with 15 minute and environmental checks. Pt currently denies SI/HI and A/V hallucinations. Pt verbally agrees to seek staff if SI/HI or A/VH occurs and to consult with staff before acting on these thoughts. Will continue POC.

## 2015-06-21 NOTE — [facility] Group Notes (Signed)
[facility] LCSW Aftercare Discharge Planning Group Note  06/21/2015  8:45 AM  Participation Quality: Did Not Attend. Patient invited to participate but declined.  Aharon Carriere, MSW, LCSW Clinical Social Worker [facility] [phone]   
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
## 2015-06-21 NOTE — Progress Notes (Signed)
Recreation Therapy Notes  Date: 03.31.2017 Time: 9:30am Location: [address] Group Room   Group Topic: Stress Management  Goal Area(s) Addresses:  Patient will actively participate in stress management techniques presented during session.   Behavioral Response: Did not attend.   Marykay Lexenise L Dewey Viens, LRT/CTRS        Shonna Deiter L 06/21/2015 2:03 PM

## 2015-06-21 NOTE — Progress Notes (Signed)
Patient ID: Joshua GrimesJames B Brau, male   DOB: [birth year], 35 y.o.   MRN: [id number]  Pt becomes irritable while on the phone, starts to bang hands on wall and yell at person on the phone. Pt states "I would strangle her neck." Pt balling up hands. Pt states "I want to break something!" MD notified, prn medication ordered, see MAR. After medication administration, pt begins to laugh. Pt currently laying in bed, in no distress. Will continue to monitor.

## 2015-06-21 NOTE — Progress Notes (Signed)
Patient did not attend wrap up group. 

## 2015-06-22 DIAGNOSIS — F11222 Opioid dependence with intoxication with perceptual disturbance: Principal | ICD-10-CM

## 2015-06-22 MED ORDER — ESTRADIOL 0.05 MG/24HR TD PTWK
0.0500 mg | MEDICATED_PATCH | TRANSDERMAL | Status: DC
  Administered 2015-06-22 – 2015-06-25 (×2): 0.05 mg via TRANSDERMAL
  Filled 2015-06-22 (×3): qty 1

## 2015-06-22 NOTE — Progress Notes (Signed)
D: Pt is flat, isolative and withdrawn to room; was in bed all evening with eyes closed. Pt at the time of assessment denies depression, anxiety, pain, SI, HI or AVH; states, "I'm OK wright now, just want to sleep." Pt remained calm and cooperative A: Medications offered as prescribed.  Support, encouragement, and safe environment provided.  15-minute safety checks continue. R: Pt was med compliant.  Pt did not attend wrap-up group. Safety checks continue.

## 2015-06-22 NOTE — [facility] Group Notes (Signed)
[facility] Group Notes: (Clinical Social Work)   06/22/2015      Type of Therapy:  Group Therapy   Participation Level:  Did Not Attend despite MHT prompting   Ambrose MantleMareida Grossman-Orr, LCSW 06/22/2015, 1:29 PM

## 2015-06-22 NOTE — Progress Notes (Signed)
Patient did attend the evening speaker AA meeting.  

## 2015-06-22 NOTE — Progress Notes (Signed)
D: Patient has been in room most of shift. Patient states he feels he was able to get some affairs taken care of with the bank today. Patient states his goal was to "get in contact with the bank and get some personal affairs taken care of."  A: Support and encouragement offered. Q 15 minute checks in progress and maintained for safety. R: Patient remains safe on unit.

## 2015-06-22 NOTE — Progress Notes (Signed)
Patient ID: USBALDO PANNONE, male   DOB: 1980-09-22, 35 y.o.   MRN: [id number] [facility] MD Progress Note  06/22/2015 4:07 PM OBRIEN HUSKINS  MRN:  [id number]  Subjective:  [name] states, "I'm still feeling very depressed today". Mellody Dance is lying down in his bed when assessed. He is verbally responsive, however, did not open his eyes to make eye contact. He is currently not attending groups. He says it is because he is feeling very tired, weak & depressed. He says he is going through a lot of drama with his husband & it is getting to him emotionally. Mellody Dance is encouraged to participate in group sessions to learn coping skills. He currently denies any SIHI, AVH. He is able to contract for safety.  Principal Problem: Polysubstance dependence including opioid type drug, continuous use, with perceptual disturbance (HCC)  Diagnosis:   Patient Active Problem List   Diagnosis Date Noted  . Polysubstance dependence including opioid drug with daily use (HCC) [F19.20] 06/20/2015  . Polysubstance dependence including opioid type drug, continuous use, with perceptual disturbance (HCC) [F11.222] 06/20/2015  . 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 Benzodiazepine dependence (HCC) [F13.20] 10/04/2013  . Cocaine dependence (HCC) [F14.20] 10/04/2013  . Opioid abuse with opioid-induced mood disorder (HCC) [F11.14] 10/04/2013  . PTSD (post-traumatic stress disorder) [F43.10] 10/03/2013  . Substance induced mood disorder (HCC) [F19.94] 10/03/2013  . Polysubstance dependence (HCC) [F19.20] 10/02/2013   Total Time spent with patient: 15 minutes  Past Psychiatric History: see admission H and P  Past Medical History:  Past Medical History  Diagnosis Date  . Schizoaffective disorder (HCC)   . PTSD (post-traumatic stress disorder)   . Depression   . Liver disorder   . Gender identity disorder    History reviewed. No pertinent past surgical history. Family History:  Family History  Problem Relation  Age of Onset  . Multiple sclerosis Mother    Family Psychiatric  History: See admission H and P  Social History:  History  Alcohol Use  . Yes    Comment: occasional     History  Drug Use  . Yes  . Special: Cocaine, Methamphetamines, Marijuana    Comment: last useof methamphetamines and marijuana today.    Social History   Social History  . Marital Status: Single    Spouse Name: N/A  . Number of Children: N/A  . Years of Education: N/A   Social History Main Topics  . Smoking status: Current Every Day Smoker -- 1.00 packs/day    Types: Cigarettes  . Smokeless tobacco: Never Used  . Alcohol Use: Yes     Comment: occasional  . Drug Use: Yes    Special: Cocaine, Methamphetamines, Marijuana     Comment: last useof methamphetamines and marijuana today.  Marland Kitchen Sexual Activity: Not Currently   Other Topics Concern  . None   Social History Narrative   Additional Social History:   Sleep: Fair  Appetite:  Poor  Current Medications: Current Facility-Administered Medications  Medication Dose Route Frequency Provider Last Rate Last Dose  . acetaminophen (TYLENOL) tablet 650 mg  650 mg Oral Q6H PRN Sanjuana Kava, NP      . alum & mag hydroxide-simeth (MAALOX/MYLANTA) 200-200-20 MG/5ML suspension 30 mL  30 mL Oral Q4H PRN Sanjuana Kava, NP      . ARIPiprazole (ABILIFY) tablet 10 mg  10 mg Oral Daily Sanjuana Kava, NP   10 mg at 06/22/15 0916  . aspirin EC tablet 81 mg  81 mg Oral Daily Sanjuana KavaAgnes I Bastion Bolger, NP   81 mg at 06/22/15 0916  . atorvastatin (LIPITOR) tablet 20 mg  20 mg Oral Daily Sanjuana KavaAgnes I Tarrah Furuta, NP   20 mg at 06/22/15 0916  . emtricitabine-tenofovir (TRUVADA) 200-300 MG per tablet 1 tablet  1 tablet Oral Daily Rachael FeeIrving A Lugo, MD   1 tablet at 06/22/15 [phone]  . estradiol (CLIMARA - Dosed in mg/24 hr) patch 0.05 mg  0.05 mg Transdermal Weekly Rachael FeeIrving A Lugo, MD   0.05 mg at 06/22/15 1323  . finasteride (PROSCAR) tablet 10 mg  10 mg Oral Daily Sanjuana KavaAgnes I Jaceion Aday, NP   10 mg at 06/22/15  0917  . Fish Oil CAPS 3,000 mg  3,000 mg Oral Daily Sanjuana KavaAgnes I Walton Digilio, NP   3,000 mg at 06/22/15 [id number]  . gabapentin (NEURONTIN) capsule 300 mg  300 mg Oral TID Sanjuana KavaAgnes I Merritt Mccravy, NP   300 mg at 06/22/15 1325  . hydrOXYzine (ATARAX/VISTARIL) tablet 25 mg  25 mg Oral Q6H Sanjuana KavaAgnes I Kaliegh Willadsen, NP   25 mg at 06/22/15 1507  . hydrOXYzine (ATARAX/VISTARIL) tablet 25 mg  25 mg Oral Q6H PRN Sanjuana KavaAgnes I Keyna Blizard, NP   25 mg at 06/22/15 1502  . loperamide (IMODIUM) capsule 2-4 mg  2-4 mg Oral PRN Sanjuana KavaAgnes I Mylo Choi, NP      . LORazepam (ATIVAN) tablet 1 mg  1 mg Oral Q6H PRN Rachael FeeIrving A Lugo, MD      . LORazepam (ATIVAN) tablet 1 mg  1 mg Oral TID Rachael FeeIrving A Lugo, MD   1 mg at 06/22/15 1325   Followed by  . [START ON 06/23/2015] LORazepam (ATIVAN) tablet 1 mg  1 mg Oral BID Rachael FeeIrving A Lugo, MD       Followed by  . [START ON 06/25/2015] LORazepam (ATIVAN) tablet 1 mg  1 mg Oral Daily Rachael FeeIrving A Lugo, MD      . magnesium hydroxide (MILK OF MAGNESIA) suspension 30 mL  30 mL Oral Daily PRN Sanjuana KavaAgnes I Deshanae Lindo, NP      . multivitamin with minerals tablet 1 tablet  1 tablet Oral Daily Sanjuana KavaAgnes I Antoniette Peake, NP   1 tablet at 06/22/15 [phone]  . nicotine (NICODERM CQ - dosed in mg/24 hours) patch 21 mg  21 mg Transdermal Q0600 Sanjuana KavaAgnes I Londynn Sonoda, NP   21 mg at 06/22/15 0918  . ondansetron (ZOFRAN-ODT) disintegrating tablet 4 mg  4 mg Oral Q6H PRN Sanjuana KavaAgnes I Rowdy Guerrini, NP      . pantoprazole (PROTONIX) EC tablet 40 mg  40 mg Oral Daily Sanjuana KavaAgnes I Miliyah Luper, NP   40 mg at 06/22/15 0919  . thiamine (VITAMIN B-1) tablet 100 mg  100 mg Oral Daily Sanjuana KavaAgnes I Lesli Issa, NP   100 mg at 06/22/15 0919  . traZODone (DESYREL) tablet 50 mg  50 mg Oral QHS PRN Sanjuana KavaAgnes I Glenette Bookwalter, NP       Lab Results:  No results found for this or any previous visit (from the past 48 hour(s)).  Blood Alcohol level:  Lab Results  [facility] Value Date   [facility]   ETH <11 10/01/2013   Physical Findings: AIMS: Facial and Oral Movements Muscles of Facial Expression: None, normal Lips and Perioral Area: None,  normal Jaw: None, normal Tongue: None, normal,Extremity Movements Upper (arms, wrists, hands, fingers): None, normal Lower (legs, knees, ankles, toes): None, normal, Trunk Movements Neck, shoulders, hips: None, normal, Overall Severity Severity of abnormal movements (highest score from questions above): None, normal Incapacitation due  to abnormal movements: None, normal Patient's awareness of abnormal movements (rate only patient's report): No Awareness, Dental Status Current problems with teeth and/or dentures?: No Does patient usually wear dentures?: No  CIWA:  CIWA-Ar Total: 5 COWS:     Musculoskeletal: Strength & Muscle Tone: within normal limits Gait & Station: normal Patient leans: normal  Psychiatric Specialty Exam: Review of Systems  Constitutional: Positive for malaise/fatigue.  HENT: Negative.   Eyes: Negative.   Respiratory: Negative.   Cardiovascular: Negative.   Gastrointestinal: Negative.   Genitourinary: Negative.   Musculoskeletal: Negative.   Skin: Negative.   Neurological: Positive for weakness.  Endo/Heme/Allergies: Negative.   Psychiatric/Behavioral: Positive for depression and substance abuse. The patient is nervous/anxious.     Blood pressure 86/53, pulse 84, temperature 97.7 F (36.5 C), temperature source Oral, resp. rate 16, height 5' 9.5" (1.765 m), weight 70.308 kg (155 lb).Body mass index is 22.57 kg/(m^2).  General Appearance: Disheveled  Eye Contact::  Minimal  Speech:  Clear and Coherent and Slow  Volume:  Decreased  Mood:  Anxious, Depressed and Dysphoric  Affect:  Restricted  Thought Process:  Coherent and Goal Directed  Orientation:  Full (Time, Place, and Person)  Thought Content:  answers questions on symptoms events   Suicidal Thoughts:  No  Homicidal Thoughts:  No  Memory:  Immediate;   Fair Recent;   Fair Remote;   Fair  Judgement:  Fair  Insight:  Shallow  Psychomotor Activity:  Decreased  Concentration:  Poor  Recall:   Poor  Fund of Knowledge:Fair  Language: Fair  Akathisia:  No  Handed:  Right  AIMS (if indicated):     Assets:  Desire for Improvement  ADL's:  Intact  Cognition: WNL  Sleep:  Number of Hours: 6.75   Treatment Plan Summary: Daily contact with patient to assess and evaluate symptoms and progress in treatment and Medication management Supportive approach/coping skills Polysubstance dependence; will change the librium to the Ativan detox protocol as the Librium could be more sedating/work a relapse prevention plan Anxiety-agitation; will continue the Neurontin (off label) Mood instability; continue the Abilify 10 mg and optimize dose response Insomnia; continue the Trazodone Note; got agitated while talking on the phone started hitting the wall upset as stated partner is spending his money. Ativan 2 mg IM was provided Work with CBT/mindfulness/stress management/problem solving. Continue current plan of care.  Armandina Stammer I, NP, PMHNP-BC 06/22/2015, 4:07 PM
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
## 2015-06-23 MED ORDER — OMEGA-3-ACID ETHYL ESTERS 1 G PO CAPS
2.0000 g | ORAL_CAPSULE | Freq: Every day | ORAL | Status: DC
  Administered 2015-06-23 – 2015-06-29 (×7): 2 g via ORAL
  Filled 2015-06-23 (×8): qty 2

## 2015-06-23 NOTE — Progress Notes (Signed)
Patient ID: Charlynn GrimesJames B Mazzuca, male   DOB: Sep 20, 1980, 35 y.o.   MRN: [id number]  Adult Psychoeducational Group Note  Date:  06/23/2015 Time:  1:45pm   Group Topic/Focus:  Identifying Needs:   The focus of this group is to help patients identify their personal needs that have been historically problematic and identify healthy behaviors to address their needs.  Participation Level:  Did Not Attend  Participation Quality: n/a  Affect: n/a  Cognitive: n/a  Insight:n/a  Engagement in Group:  n/a  Modes of Intervention:  Activity, Discussion, Education and Support  Additional Comments:  Pt did not attend group, pt in bed asleep.   Aurora Maskwyman, Mical Kicklighter E 06/23/2015, 2:21 PM

## 2015-06-23 NOTE — Progress Notes (Signed)
Patient ID: Charlynn GrimesJames B Khalid, male   DOB: [birth year], 35 y.o.   MRN: [id number] Patient ID: Charlynn GrimesJames B Mikhail, male   DOB: [birth year], 35 y.o.   MRN: [id number] [facility]  06/23/2015 5:23 PM Charlynn GrimesJames B Pegues  MRN:  [id number]  Subjective:  [name] states, "I'm still feeling very depressed. If you guys want me to feel better, please discharge me. I do not want to be here. I don't want to attend any groups, it does not help me" [name] however, denies any SIHI, AVH,  Principal Problem: Polysubstance dependence including opioid type drug, continuous use, with perceptual disturbance (HCC)  Diagnosis:   Patient Active Problem List   Diagnosis Date Noted  . Polysubstance dependence including opioid drug with daily use (HCC) [F19.20] 06/20/2015  . Polysubstance dependence including opioid type drug, continuous use, with perceptual disturbance (HCC) [F11.222] 06/20/2015  . 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 Benzodiazepine dependence (HCC) [F13.20] 10/04/2013  . Cocaine dependence (HCC) [F14.20] 10/04/2013  . Opioid abuse with opioid-induced mood disorder (HCC) [F11.14] 10/04/2013  . PTSD (post-traumatic stress disorder) [F43.10] 10/03/2013  . Substance induced mood disorder (HCC) [F19.94] 10/03/2013  . Polysubstance dependence (HCC) [F19.20] 10/02/2013   Total Time spent with patient: 15 minutes  Past Psychiatric History: See admission H and P  Past Medical History:  Past Medical History  Diagnosis Date  . Schizoaffective disorder (HCC)   . PTSD (post-traumatic stress disorder)   . Depression   . Liver disorder   . Gender identity disorder    History reviewed. No pertinent past surgical history. Family History:  Family History  Problem Relation Age of Onset  . Multiple sclerosis Mother    Family Psychiatric  History: See admission H and P  Social History:  History  Alcohol Use  . Yes    Comment: occasional     History  Drug Use  . Yes  . Special: Cocaine,  Methamphetamines, Marijuana    Comment: last useof methamphetamines and marijuana today.    Social History   Social History  . Marital Status: Single    Spouse Name: N/A  . Number of Children: N/A  . Years of Education: N/A   Social History Main Topics  . Smoking status: Current Every Day Smoker -- 1.00 packs/day    Types: Cigarettes  . Smokeless tobacco: Never Used  . Alcohol Use: Yes     Comment: occasional  . Drug Use: Yes    Special: Cocaine, Methamphetamines, Marijuana     Comment: last useof methamphetamines and marijuana today.  Marland Kitchen. Sexual Activity: Not Currently   Other Topics Concern  . None   Social History Narrative   Additional Social History:   Sleep: Good  Appetite:  Fair  Current Medications: Current Facility-Administered Medications  Medication Dose Route Frequency Provider Last Rate Last Dose  . acetaminophen (TYLENOL) tablet 650 mg  650 mg Oral Q6H PRN Sanjuana KavaAgnes I Bertram Haddix, NP      . alum & mag hydroxide-simeth (MAALOX/MYLANTA) 200-200-20 MG/5ML suspension 30 mL  30 mL Oral Q4H PRN Sanjuana KavaAgnes I Evita Merida, NP      . ARIPiprazole (ABILIFY) tablet 10 mg  10 mg Oral Daily Sanjuana KavaAgnes I Deontrae Drinkard, NP   10 mg at 06/23/15 0918  . aspirin EC tablet 81 mg  81 mg Oral Daily Sanjuana KavaAgnes I Donaldson Richter, NP   81 mg at 06/23/15 0920  . atorvastatin (LIPITOR) tablet 20 mg  20 mg Oral Daily Sanjuana KavaAgnes I Mathius Birkeland, NP   20 mg  at 06/23/15 0918  . emtricitabine-tenofovir (TRUVADA) 200-300 MG per tablet 1 tablet  1 tablet Oral Daily Rachael Fee, MD   1 tablet at 06/23/15 0919  . estradiol (CLIMARA - Dosed in mg/24 hr) patch 0.05 mg  0.05 mg Transdermal Weekly Rachael Fee, MD   0.05 mg at 06/22/15 1323  . finasteride (PROSCAR) tablet 10 mg  10 mg Oral Daily Sanjuana Kava, NP   10 mg at 06/23/15 0920  . gabapentin (NEURONTIN) capsule 300 mg  300 mg Oral TID Sanjuana Kava, NP   300 mg at 06/23/15 1656  . hydrOXYzine (ATARAX/VISTARIL) tablet 25 mg  25 mg Oral Q6H Sanjuana Kava, NP   25 mg at 06/23/15 1521  . LORazepam  (ATIVAN) tablet 1 mg  1 mg Oral Q6H PRN Rachael Fee, MD      . LORazepam (ATIVAN) tablet 1 mg  1 mg Oral BID Rachael Fee, MD   1 mg at 06/23/15 1656   Followed by  . [START ON 06/25/2015] LORazepam (ATIVAN) tablet 1 mg  1 mg Oral Daily Rachael Fee, MD      . magnesium hydroxide (MILK OF MAGNESIA) suspension 30 mL  30 mL Oral Daily PRN Sanjuana Kava, NP      . multivitamin with minerals tablet 1 tablet  1 tablet Oral Daily Sanjuana Kava, NP   1 tablet at 06/23/15 0920  . nicotine (NICODERM CQ - dosed in mg/24 hours) patch 21 mg  21 mg Transdermal Q0600 Sanjuana Kava, NP   21 mg at 06/22/15 0918  . omega-3 acid ethyl esters (LOVAZA) capsule 2 g  2 g Oral Daily Rachael Fee, MD   2 g at 06/23/15 0919  . pantoprazole (PROTONIX) EC tablet 40 mg  40 mg Oral Daily Sanjuana Kava, NP   40 mg at 06/23/15 0919  . thiamine (VITAMIN B-1) tablet 100 mg  100 mg Oral Daily Sanjuana Kava, NP   100 mg at 06/23/15 0920  . traZODone (DESYREL) tablet 50 mg  50 mg Oral QHS PRN Sanjuana Kava, NP       Lab Results:  No results found for this or any previous visit (from the past 48 hour(s)).  Blood Alcohol level:  Lab Results  Component Value Date   [facility] <5 06/19/2015   ETH <11 10/01/2013   Physical Findings: AIMS: Facial and Oral Movements Muscles of Facial Expression: None, normal Lips and Perioral Area: None, normal Jaw: None, normal Tongue: None, normal,Extremity Movements Upper (arms, wrists, hands, fingers): None, normal Lower (legs, knees, ankles, toes): None, normal, Trunk Movements Neck, shoulders, hips: None, normal, Overall Severity Severity of abnormal movements (highest score from questions above): None, normal Incapacitation due to abnormal movements: None, normal Patient's awareness of abnormal movements (rate only patient's report): No Awareness, Dental Status Current problems with teeth and/or dentures?: No Does patient usually wear dentures?: No  CIWA:  CIWA-Ar Total: 2 COWS:      Musculoskeletal: Strength & Muscle Tone: within normal limits Gait & Station: normal Patient leans: normal  Psychiatric Specialty Exam: Review of Systems  Constitutional: Positive for malaise/fatigue.  HENT: Negative.   Eyes: Negative.   Respiratory: Negative.   Cardiovascular: Negative.   Gastrointestinal: Negative.   Genitourinary: Negative.   Musculoskeletal: Negative.   Skin: Negative.   Neurological: Positive for weakness.  Endo/Heme/Allergies: Negative.   Psychiatric/Behavioral: Positive for depression and substance abuse. The patient is nervous/anxious.  Blood pressure 129/97, pulse 128, temperature 97.7 F (36.5 C), temperature source Oral, resp. rate 16, height 5' 9.5" (1.765 m), weight 70.308 kg (155 lb).Body mass index is 22.57 kg/(m^2).  General Appearance: Disheveled  Eye Contact::  Minimal  Speech:  Clear and Coherent and Slow  Volume:  Decreased  Mood:  Anxious, Depressed and Dysphoric  Affect:  Restricted  Thought Process:  Coherent and Goal Directed  Orientation:  Full (Time, Place, and Person)  Thought Content:  answers questions on symptoms events   Suicidal Thoughts:  No  Homicidal Thoughts:  No  Memory:  Immediate;   Fair Recent;   Fair Remote;   Fair  Judgement:  Fair  Insight:  Shallow  Psychomotor Activity:  Decreased  Concentration:  Poor  Recall:  Poor  Fund of Knowledge:Fair  Language: Fair  Akathisia:  No  Handed:  Right  AIMS (if indicated):     Assets:  Desire for Improvement  ADL's:  Intact  Cognition: WNL  Sleep:  Number of Hours: 6   Treatment Plan Summary: Daily contact with patient to assess and evaluate symptoms and progress in treatment and Medication management Supportive approach/coping skills Polysubstance dependence; Continue the Ativan detox protocol,work a relapse prevention plan Anxiety-agitation; will continue the Neurontin (off label). Mood instability; continue the Abilify 10 mg and optimize dose  response Insomnia; continue the Trazodone 50 mg Not attending group milieu: provided with encourage to join the ongoing group sessions Work with CBT/mindfulness/stress management/problem solving. Patient wants to be discharged tomorrow. Continue current plan of care.  Armandina Stammer I, NP, PMHNP-BC 06/23/2015, 5:23 PM

## 2015-06-23 NOTE — [facility] Group Notes (Signed)
[facility] Group Notes: (Clinical Social Work)   06/23/2015      Type of Therapy:  Group Therapy   Participation Level:  Did Not Attend despite MHT prompting   Ilian Wessell Grossman-Orr, LCSW 06/23/2015, 12:24 PM     

## 2015-06-23 NOTE — Progress Notes (Signed)
Patient did not attend the evening speaker AA meeting. Pt was notified that group was beginning but remained in bed.   

## 2015-06-23 NOTE — Progress Notes (Signed)
Patient ID: Charlynn GrimesJames B Krisko, male   DOB: Aug 01, 1980, 35 y.o.   MRN: [id number]   Pt currently presents with a flat affect and depressed behavior. Pt reports to Clinical research associatewriter "I really don't feel well." Pt reports headache and general body aches as a part of his withdrawal. Pt did attend lunch in the cafeteria today. Pt approached writer about afternoon meds, came to window without prompting. Pt refuses to go to dinner, NP endorses that pt should attend dinner. Pt bangs elbow on wall while walking to dinner, no erythema or swelling noted. Pt walked to line in cafeteria.   Pt provided with medications per providers orders. Pt's labs and vitals were monitored throughout the day. Pt supported emotionally and encouraged to express concerns and questions. Pt educated on medications. Pt encouraged to attend groups and interact with peers.   Pt's safety ensured with 15 minute and environmental checks. Pt endorses passive SI, no plan. Pt currently denies HI and A/V hallucinations. Pt verbally agrees to seek staff if SI/HI or A/VH occurs and to consult with staff before acting on these thoughts. Will continue POC.

## 2015-06-24 MED ORDER — MODAFINIL 100 MG PO TABS: 100.0000 mg | ORAL_TABLET | Freq: Every day | ORAL | Status: DC

## 2015-06-24 MED ORDER — ARIPIPRAZOLE 10 MG PO TABS: 10.0000 mg | ORAL_TABLET | Freq: Every day | ORAL | Status: DC

## 2015-06-24 MED ORDER — PANTOPRAZOLE SODIUM 40 MG PO TBEC: 40.0000 mg | DELAYED_RELEASE_TABLET | Freq: Every day | ORAL | Status: DC

## 2015-06-24 MED ORDER — MODAFINIL 200 MG PO TABS
100.0000 mg | ORAL_TABLET | Freq: Every day | ORAL | Status: DC
  Administered 2015-06-24 – 2015-06-26 (×3): 100 mg via ORAL
  Filled 2015-06-24 (×3): qty 1

## 2015-06-24 MED ORDER — EMTRICITABINE-TENOFOVIR DF 200-300 MG PO TABS: 1.0000 | ORAL_TABLET | Freq: Every day | ORAL | Status: DC

## 2015-06-24 MED ORDER — TRAZODONE HCL 50 MG PO TABS: 50.0000 mg | ORAL_TABLET | Freq: Every evening | ORAL | Status: DC | PRN

## 2015-06-24 MED ORDER — NICOTINE 21 MG/24HR TD PT24: 21.0000 mg | MEDICATED_PATCH | Freq: Every day | TRANSDERMAL | Status: DC

## 2015-06-24 MED ORDER — ASPIRIN 81 MG PO TBEC: 81.0000 mg | DELAYED_RELEASE_TABLET | Freq: Every day | ORAL | Status: DC

## 2015-06-24 MED ORDER — PAROXETINE HCL 20 MG PO TABS
20.0000 mg | ORAL_TABLET | Freq: Every day | ORAL | Status: DC
  Administered 2015-06-24 – 2015-06-29 (×6): 20 mg via ORAL
  Filled 2015-06-24 (×8): qty 1

## 2015-06-24 MED ORDER — ATORVASTATIN CALCIUM 20 MG PO TABS: 20.0000 mg | ORAL_TABLET | Freq: Every day | ORAL | Status: DC

## 2015-06-24 MED ORDER — FINASTERIDE 5 MG PO TABS: 10.0000 mg | ORAL_TABLET | Freq: Every day | ORAL | Status: DC

## 2015-06-24 MED ORDER — PAROXETINE HCL 20 MG PO TABS: 20.0000 mg | ORAL_TABLET | Freq: Every day | ORAL | Status: DC

## 2015-06-24 MED ORDER — ESTRADIOL 0.05 MG/24HR TD PTTW: 1.0000 | MEDICATED_PATCH | Freq: Every day | TRANSDERMAL | Status: DC

## 2015-06-24 MED ORDER — HYDROXYZINE HCL 25 MG PO TABS: 25.0000 mg | ORAL_TABLET | Freq: Four times a day (QID) | ORAL | Status: DC

## 2015-06-24 NOTE — Progress Notes (Signed)
Recreation Therapy Notes  Date: 04.03.2017 Time: 9:30am Location: [address] Group Room   Group Topic: Stress Management  Goal Area(s) Addresses:  Patient will actively participate in stress management techniques presented during session.   Behavioral Response: Bizarre at times, Immature   Intervention: Stress management techniques  Activity :  Deep Breathing, Progressive Body Relaxation and Progressive Body Scan. LRT provided education, instruction and demonstration on practice of Deep Breathing, Progressive Body Relaxation and Progressive Body Scan. Patient was asked to participate in technique introduced during session.   Education:  Stress Management, Discharge Planning.   Education Outcome: Acknowledges education  Clinical Observations/Feedback: Patient attended session, but was unable to remain silent during activities, requiring redirection from LRT. Patient additionally identified himself as another person in an attempt to trick LRT. Patient was additionally observed to close his eyes during techniques and move in slow rhythmic motions that did not correspond with instructions for activity.   Marykay Lexenise L Skyler Dusing, LRT/CTRS  Jearl KlinefelterBlanchfield, Riely Oetken L 06/24/2015 2:58 PM
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
## 2015-06-24 NOTE — [facility] Suicide Risk Assessment (Signed)
[facility] INPATIENT:  Family/Significant Other Suicide Prevention Education  Suicide Prevention Education:  Contact Attempts: Jerilynn MagesBeth Catano (pt's mother) [phone] has been identified by the patient as the family member/significant other with whom the patient will be residing, and identified as the person(s) who will aid the patient in the event of a mental health crisis.  With written consent from the patient, two attempts were made to provide suicide prevention education, prior to and/or following the patient's discharge.  We were unsuccessful in providing suicide prevention education.  A suicide education pamphlet was given to the patient to share with family/significant other.  Date and time of first attempt: 06/24/15 at 4:08PM (Voicemail left requesting call back).   Smart, Jaidon Sponsel LCSW 06/24/2015, 4:09 PM   Beth called CSW back and shared that she is concerned if pt is to d/c, he will relapse and be homeless. She is not concerned about SI/HI, but states that pt is not making good choices for himself and is at risk for dangerous self neglect. She is asking for at least one more day "so the medications can start taking effect." CSW informed pt's mother that this information and her concerns would be shared with the MD and after a decision is made, CSW will call her back to inform her of pt's discharge date.   Trula SladeHeather Smart, MSW, LCSW Clinical Social Worker 06/25/2015 8:45 AM

## 2015-06-24 NOTE — Progress Notes (Signed)
D: Pt has depressed affect and mood.  Pt was in bed upon initial approach.  Pt describes day as being "terrible" and that daily goal was "trying to have a conversation with my mother on the telephone, I'm coming down off my drug high."  Pt reports being "upset they made me walk to dinner."  Pt denies SI/HI, denies hallucinations, denies pain.  Pt has stayed in room for the majority of the night and did not attend evening group.   A: Introduced self to pt.  Actively listened to pt and offered support and encouragement.  Medications administered per order.  PO fluids encouraged and provided. R: Pt is compliant with medication.  Pt verbally contracts for safety.  Will continue to monitor and assess.

## 2015-06-24 NOTE — Plan of Care (Signed)
Problem: Alteration in mood & ability to function due to Goal: LTG-Pt reports reduction in suicidal thoughts (Patient reports reduction in suicidal thoughts and is able to verbalize a safety plan for whenever patient is feeling suicidal)  Outcome: Progressing Pt denies SI tonight.

## 2015-06-24 NOTE — [facility] Group Notes (Signed)
[facility] LCSW Group Therapy  06/24/2015 1:09 PM  Type of Therapy:  Group Therapy  Participation Level:  Active  Participation Quality:  Drowsy, Intrusive and Inattentive  Affect:  Anxious and Lethargic  Cognitive:  Lacking  Insight:  Lacking and Poor  Engagement in Therapy:  Poor  Modes of Intervention:  Confrontation, Discussion, Education, Exploration, Problem-solving, Rapport Building, Socialization and Support  Summary of Progress/Problems: Today's Topic: Overcoming Obstacles. Patients identified one short term goal and potential obstacles in reaching this goal. Patients processed barriers involved in overcoming these obstacles. Patients identified steps necessary for overcoming these obstacles and explored motivation (internal and external) for facing these difficulties head on. Joshua Davila was attentive and engaged during today's processing group. "she" shared that her biggest obstacle involves figuring out how to get her life back together after leaving her husband. "How do I get my stuff back without freaking out." She was receptive to feedback from the group but had difficulty remaining on topic and remaining appropriate. Receptive to frequent redirection.   Joshua Davila, Joshua Hamada LCSW 06/24/2015, 1:09 PM
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
## 2015-06-24 NOTE — Care Management Note (Signed)
PER STATE REGULATIONS 482.30  THIS CHART WAS REVIEWED FOR MEDICAL NECESSITY WITH RESPECT TO THE PATIENT'S ADMISSION/ DURATION OF STAY.  NEXT REVIEW DATE:   06/28/2015  Willa RoughJENNIFER JONES Cythina Mickelsen, RN, BSN CASE MANAGER]
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
## 2015-06-24 NOTE — Progress Notes (Signed)
Pt attended spiritual care group on grief and loss facilitated by chaplain Faelynn Wynder   Group opened with brief discussion and psycho-social ed around grief and loss in relationships and in relation to self - identifying life patterns, circumstances, changes that cause losses. Established group norm of speaking from own life experience. Group goal of establishing open and affirming space for members to share loss and experience with grief, normalize grief experience and provide psycho social education and grief support.     

## 2015-06-25 MED ORDER — NICOTINE POLACRILEX 2 MG MT GUM
2.0000 mg | CHEWING_GUM | OROMUCOSAL | Status: DC | PRN
  Administered 2015-06-26 – 2015-06-28 (×6): 2 mg via ORAL
  Filled 2015-06-25: qty 10
  Filled 2015-06-25 (×2): qty 1

## 2015-06-25 NOTE — Progress Notes (Signed)
D: Pt was in the day room upon initial approach.  Pt has labile affect and mood.  Pt was smiling at the beginning of shift, talking with peers.  During group, pt became tearful and anxious.  Pt reported anxiety related to stressors with spouse and whether or not pt should call spouse.  Pt reports "I went to the groups today" and "I'm leaving tomorrow.  I want to have the police take me back to my house, look for my ID, get an apartment, I'm just so depressed."  Pt reports feeling safe to discharge tomorrow although pt reports feeling overwhelmed regarding conflict with spouse, lack of transportation, unstable living arrangements.  Pt states "I'm fucking heartbroken."  Pt denies SI/HI, denies hallucinations, denies pain.  When asked if pt was having withdrawal symptoms, pt states "I've been thinking about using dabs or that wax stuff."  Pt has been visible in milieu at times.  Pt did not attend evening group.    A: Actively listened to pt and offered support and encouragement.  Medications administered per order.  PRN medication administered for sleep. R: Pt is compliant with medications.  Pt verbally contracts for safety.  Will continue to monitor and assess.

## 2015-06-25 NOTE — Progress Notes (Signed)
D: Pt has depressed affect and mood.  Pt has stayed in bed for the majority of the night.  Pt reports "I just slept all day."  Pt forwards little information to Buyer, retailwriter tonight.  Pt denies SI/HI, denies hallucinations, denies pain.   A: Actively listened to pt and offered support and encouragement.  Medication administered per order.   R: Pt is compliant with medication.  Pt verbally contracts for safety.  Will continue to monitor and assess.

## 2015-06-25 NOTE — [facility] Group Notes (Signed)
[facility] LCSW Group Therapy  06/25/2015 2:23 PM  Type of Therapy:  Group Therapy  Participation Level:  Did Not Attend-pt chose to rest in bed.   Summary of Progress/Problems: [facility] Speaker came to talk about his personal journey with substance abuse and addiction.   Smart, Kashmir Leedy LCSW 06/25/2015, 2:23 PM

## 2015-06-25 NOTE — [facility] Group Notes (Signed)
The focus of this group is to educate the patient on the purpose and policies of crisis stabilization and provide a format to answer questions about their admission.  The group details unit policies and expectations of patients while admitted.  Patient did not attend 0900 nurse education orientation group this morning.  Patient stayed in bed.   

## 2015-06-25 NOTE — [facility] Group Notes (Signed)
Patient did not attend group.
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
## 2015-06-25 NOTE — Plan of Care (Signed)
Problem: Diagnosis: Increased Risk For Suicide Attempt Goal: STG-Patient Will Comply With Medication Regime Outcome: Progressing Pt has been compliant with medications tonight.       

## 2015-06-25 NOTE — Progress Notes (Signed)
Recreation Therapy Notes  Animal-Assisted Activity (AAA) Program Checklist/Progress Notes Patient Eligibility Criteria Checklist & Daily Group note for Rec Tx Intervention  Date: 04.04.2017 Time: 2:45pm [address]    AAA/T Program Assumption of Risk Form signed by Patient/ or Parent Legal Guardian Yes  Patient is free of allergies or sever asthma Yes  Patient reports no fear of animals Yes  Patient reports no history of cruelty to animals Yes  Patient understands his/her participation is voluntary Yes  Patient washes hands before animal contact Yes  Patient washes hands after animal contact Yes  Behavioral Response: Appropriate   Education: Hand Washing, Appropriate Animal Interaction   Education Outcome: Acknowledges education.   Clinical Observations/Feedback: Patient interacted appropriately with therapy dog and peers during session.    Marykay Lexenise L Julieanna Geraci, LRT/CTRS        Daneshia Tavano L 06/25/2015 3:04 PM

## 2015-06-25 NOTE — Progress Notes (Signed)
D: Patient late for medications.  Patient states, "I need a new estrogen patch.  You know they come off in the shower."  Explained to patient that I did not have one on hand.  Patient become irritable stating, "well, I'm not being unreasonable.  I just need ya'll to get me a patch."  Informed patient I would notify pharmacy.  Pharmacist was notified and she will bring an extra patch.  He denies SI/HI/AVH.  Patient is anxious over discharge.  He know that he will be discharged tomorrow.   A: Continue to monitor medication management and MD order.  Safety checks completed every 15 minutes per protocol.  Offer support and encouragement as needed. R: Patient is receptive to staff; he is cooperative and his behavior is appropriate.

## 2015-06-25 NOTE — Progress Notes (Signed)
Pt did not attend wrap up group meeting.  

## 2015-06-26 MED ORDER — MODAFINIL 200 MG PO TABS
100.0000 mg | ORAL_TABLET | Freq: Once | ORAL | Status: AC
  Administered 2015-06-26: 100 mg via ORAL
  Filled 2015-06-26: qty 1

## 2015-06-26 MED ORDER — MODAFINIL 200 MG PO TABS
100.0000 mg | ORAL_TABLET | Freq: Two times a day (BID) | ORAL | Status: DC
  Administered 2015-06-27 – 2015-06-29 (×5): 100 mg via ORAL
  Filled 2015-06-26 (×5): qty 1

## 2015-06-26 NOTE — Progress Notes (Signed)
Pt attended NA meeting this evening.  
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
## 2015-06-26 NOTE — [facility] Group Notes (Signed)
[facility]   06/26/2015 1:13 PM  Participation Quality:  Appropriate   Mood/Affect:  Appropriate  Depression Rating:  6-7  Anxiety Rating:  0  Thoughts of Suicide:  No Will you contract for safety?   NA  Current AVH:  No  Plan for Discharge/Comments:  Pt reports that he is willing to stay "a few more days" to get my head straight. Pt [facility] in order to continue with medication management and counseling.   Transportation Means: bus  Supports: mother  Counselling psychologistmart, Conservation officer, natureHeather LCSW

## 2015-06-26 NOTE — Plan of Care (Signed)
Problem: Alteration in mood & ability to function due to Goal: LTG-Patient demonstrates decreased signs of withdrawal (Patient demonstrates decreased signs of withdrawal to the point the patient is safe to return home and continue treatment in an outpatient setting)  Outcome: Progressing Patient denies any withdrawal symptoms.

## 2015-06-26 NOTE — Progress Notes (Signed)
Recreation Therapy Notes  Date: 04.04.02017 Time: 9:30am Location: [address] Group Room   Group Topic: Stress Management  Goal Area(s) Addresses:  Patient will actively participate in stress management techniques presented during session.   Behavioral Response: Disruptive, Bizarre     Intervention: Stress management techniques  Activity : Deep Breathing and Tapping. LRT provided education, instruction and demonstration on practice of Deep Breathing and Tapping. Patient was asked to participate in technique introduced during session.   Education:  Stress Management, Discharge Planning.   Education Outcome: Acknowledges education  Clinical Observations/Feedback: Patient attended group session, but did not to participate as instructed. During deep breathing patient was observed to close his eyes, put his hands in a prayer posture and move his upper body around in a circle motion. Patient leaned so far to right and left of his body LRT was concerned he was going to fall out of his chair. LRT instructed patient to stop behavior due to fears of him falling. Patient ignored LRT instruction and stated that he has been previously instructed to deep breath in this fashion. Additionally patient interrupted Tapping technique with unrelated questions and attempted to invalid benefit of technique with illogical statements.   Marykay Lexenise L Deisha Stull, LRT/CTRS         Raed Schalk L 06/26/2015 2:07 PM

## 2015-06-26 NOTE — [facility] Group Notes (Signed)
[facility] LCSW Group Therapy  06/26/2015 3:45 PM  Type of Therapy:  Group Therapy  Participation Level:  Active  Participation Quality:  Attentive/needed some redirection  Affect:  Appropriate  Cognitive:  Alert and Oriented  Insight:  Engaged  Engagement in Therapy:  Improving  Modes of Intervention:  Confrontation, Discussion, Education, Exploration, Problem-solving, Rapport Building, Socialization and Support  Summary of Progress/Problems: Feelings around Diagnosis. Joshua Davila was attentive and engaged during today's processing group. He shared that his mental health diagnosis makes him feel "alone and angry at myself." Joshua Davila shared that he turned to drugs in order to cope with his mental illness and gender identify issues "and completely ruined my life up to this point. I can't believe I let myself get this bad." Joshua Davila shared that he still is able to make goals for himself but has to remind himself to start small so that he does not let himself down. He continues to show progress in the group setting with limited insight and was in need of some redirection when getting off topic.   Smart, Octavious Zidek LCSW 06/26/2015, 3:45 PM

## 2015-06-27 NOTE — Plan of Care (Signed)
Problem: Alteration in mood & ability to function due to Goal: STG-Patient will comply with prescribed medication regimen (Patient will comply with prescribed medication regimen)  Outcome: Progressing Patient is med compliant. Asking appropriate questions regarding medication. Written education materials provided.  Problem: Diagnosis: Increased Risk For Suicide Attempt Goal: LTG-Patient Will Report Absence of Withdrawal Symptoms LTG (by discharge): Patient will report absence of withdrawal symptoms.  Outcome: Completed/Met Date Met:  06/27/15 No withdrawal reported or observed.     
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
## 2015-06-27 NOTE — Progress Notes (Signed)
D: Pt denies SI/HI/AVH. Pt is pleasant and cooperative. Pt stated he possibly wants to go to LT Tx facility. Pt continues to talk about past things done in the service.   A: Pt was offered support and encouragement. Pt was given scheduled medications. Pt was encourage to attend groups. Q 15 minute checks were done for safety.   R:Pt attends groups and interacts well with peers and staff. Pt is taking medication. Pt has no complaints at this time.Pt receptive to treatment and safety maintained on unit.

## 2015-06-27 NOTE — Plan of Care (Signed)
Problem: Ineffective individual coping Goal: STG: Patient will remain free from self harm Outcome: Progressing Pt safe on the unit at this time     

## 2015-06-27 NOTE — Progress Notes (Signed)
Patient in bed most of morning. States, "I haven't slept well since I got here." Ambulating slowly, requires prompting to come up for meds. Refused group this AM. Refusing to complete self inventory even with staff assist. He is flat in affect, mood depressed and irritable. Disheveled appearance. Denies pain, physical complaints. Medicated per orders and written information regarding meds provided. Emotional support offered. Encouraged to attend and participate in programming. He denies SI/HI and remains safe on level III obs.

## 2015-06-27 NOTE — [facility] Group Notes (Signed)
[facility] LCSW Group Therapy  06/27/2015 3:39 PM  Type of Therapy:  Group Therapy  Participation Level:  Active  Participation Quality:  Attentive  Affect:  Irritable  Cognitive:  Lacking  Insight:  Limited  Engagement in Therapy:  Engaged and Off Topic  Modes of Intervention:  Confrontation, Discussion, Education, Exploration, Problem-solving, Rapport Building, Socialization and Support  Summary of Progress/Problems: Self Sabotaging Behaviors. [name] was attentive and engaged during today's processing group and was able to identify with several of the listed self sabotaging behaviors reviewed by CSW. He had difficulty remaining on topic but was easily redirected and apologized to the group for "my negativity." He continues to struggle in the group setting with limited insight but is able to remain engaged in discussion and provides encouragement to others.   Smart, Desirie Minteer LCSW 06/27/2015, 3:39 PM

## 2015-06-27 NOTE — [facility] Group Notes (Signed)
[facility] Group Notes:  (Nursing/MHT/Case Management/Adjunct)  Date:  06/27/2015  Time:  0900   Type of Therapy:  Nurse Education  Participation Level:  Did Not Attend  Participation Quality:    Affect:    Cognitive:    Insight:    Engagement in Group:    Modes of Intervention:    Summary of Progress/Problems: Patient invited to group by this Clinical research associatewriter however he elected to remain in bed.  Merian CapronFriedman, Ranyia Witting [facility] 06/27/2015, 0930

## 2015-06-27 NOTE — Progress Notes (Signed)
[facility] Group Notes:  (Nursing/MHT/Case Management/Adjunct)  Date:  06/27/2015  Time:  2100  Type of Therapy:  wrap up group  Participation Level:  Minimal  Participation Quality:  Attentive and Supportive  Affect:  Blunted, Labile and Tearful  Cognitive:  Lacking  Insight:  Lacking  Engagement in Group:  Supportive  Modes of Intervention:  Clarification, Education and Support  Summary of Progress/Problems: Pt shared only minimal details about his day. He took a shower, he got into an argument with a nurse but didn't elaborate on details, only that it was disruptive to him. Pt also shared unhappiness with perceived unfair treatment from staff during the day. Pt didn't report on any reasons why he was here or plans for discharge. Pt is jovial during group time, adding humor with his responses  but can be seen tearful and hopeless in room or on phone calls.   Marcille BuffyMcNeil, Issai Werling S 06/27/2015, 10:11 PM

## 2015-06-28 MED ORDER — OLANZAPINE 5 MG PO TBDP
5.0000 mg | ORAL_TABLET | Freq: Three times a day (TID) | ORAL | Status: DC | PRN
  Administered 2015-06-28 (×2): 5 mg via ORAL
  Filled 2015-06-28 (×3): qty 1

## 2015-06-28 NOTE — Progress Notes (Signed)
[facility]  06/28/2015 4:20 PM Joshua GrimesJames B Davila  MRN:  [id number] Subjective:  States he is having hot flashes not sure if secondary to the hormones not sure if they need to be adjusted. Anticipating  the loss of the relationship. Plans to officially terminate it.  Continues to express that his mood is not OK. Having some ' mood swings " States he was given a medication that dissolved in his mouth that was very calming. States he would benefit from something like that as he can get really agitated. He states he feels that as he gets off the drugs that her medications might be affecting him differently.  Principal Problem: Polysubstance dependence including opioid type drug, continuous use, with perceptual disturbance (HCC) Diagnosis:   Patient Active Problem List   Diagnosis Date Noted  . Polysubstance dependence including opioid drug with daily use (HCC) [F19.20] 06/20/2015  . Polysubstance dependence including opioid type drug, continuous use, with perceptual disturbance (HCC) [F11.222] 06/20/2015  . 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 Benzodiazepine dependence (HCC) [F13.20] 10/04/2013  . Cocaine dependence (HCC) [F14.20] 10/04/2013  . Opioid abuse with opioid-induced mood disorder (HCC) [F11.14] 10/04/2013  . PTSD (post-traumatic stress disorder) [F43.10] 10/03/2013  . Substance induced mood disorder (HCC) [F19.94] 10/03/2013  . Polysubstance dependence (HCC) [F19.20] 10/02/2013   Total Time spent with patient: 20 minutes  Past Psychiatric History: see admission H and P  Past Medical History:  Past Medical History  Diagnosis Date  . Schizoaffective disorder (HCC)   . PTSD (post-traumatic stress disorder)   . Depression   . Liver disorder   . Gender identity disorder    History reviewed. No pertinent past surgical history. Family History:  Family History  Problem Relation Age of Onset  . Multiple sclerosis Mother    Family Psychiatric  History: see  admission H and P Social History:  History  Alcohol Use  . Yes    Comment: occasional     History  Drug Use  . Yes  . Special: Cocaine, Methamphetamines, Marijuana    Comment: last useof methamphetamines and marijuana today.    Social History   Social History  . Marital Status: Single    Spouse Name: N/A  . Number of Children: N/A  . Years of Education: N/A   Social History Main Topics  . Smoking status: Current Every Day Smoker -- 1.00 packs/day    Types: Cigarettes  . Smokeless tobacco: Never Used  . Alcohol Use: Yes     Comment: occasional  . Drug Use: Yes    Special: Cocaine, Methamphetamines, Marijuana     Comment: last useof methamphetamines and marijuana today.  Marland Kitchen. Sexual Activity: Not Currently   Other Topics Concern  . None   Social History Narrative   Additional Social History:                         Sleep: Fair  Appetite:  Fair  Current Medications: Current Facility-Administered Medications  Medication Dose Route Frequency Provider Last Rate Last Dose  . acetaminophen (TYLENOL) tablet 650 mg  650 mg Oral Q6H PRN Sanjuana KavaAgnes I Nwoko, NP      . alum & mag hydroxide-simeth (MAALOX/MYLANTA) 200-200-20 MG/5ML suspension 30 mL  30 mL Oral Q4H PRN Sanjuana KavaAgnes I Nwoko, NP      . ARIPiprazole (ABILIFY) tablet 10 mg  10 mg Oral Daily Sanjuana KavaAgnes I Nwoko, NP   10 mg at 06/28/15 0853  .  aspirin EC tablet 81 mg  81 mg Oral Daily Sanjuana Kava, NP   81 mg at 06/28/15 0853  . atorvastatin (LIPITOR) tablet 20 mg  20 mg Oral Daily Sanjuana Kava, NP   20 mg at 06/28/15 0852  . emtricitabine-tenofovir (TRUVADA) 200-300 MG per tablet 1 tablet  1 tablet Oral Daily Rachael Fee, MD   1 tablet at 06/28/15 [phone]  . estradiol (CLIMARA - Dosed in mg/24 hr) patch 0.05 mg  0.05 mg Transdermal Weekly Rachael Fee, MD   0.05 mg at 06/25/15 1528  . finasteride (PROSCAR) tablet 10 mg  10 mg Oral Daily Sanjuana Kava, NP   10 mg at 06/28/15 0852  . gabapentin (NEURONTIN) capsule 300 mg  300  mg Oral TID Sanjuana Kava, NP   300 mg at 06/28/15 1312  . hydrOXYzine (ATARAX/VISTARIL) tablet 25 mg  25 mg Oral Q6H Sanjuana Kava, NP   25 mg at 06/28/15 1502  . magnesium hydroxide (MILK OF MAGNESIA) suspension 30 mL  30 mL Oral Daily PRN Sanjuana Kava, NP      . modafinil (PROVIGIL) tablet 100 mg  100 mg Oral BID Rachael Fee, MD   100 mg at 06/28/15 1312  . multivitamin with minerals tablet 1 tablet  1 tablet Oral Daily Sanjuana Kava, NP   1 tablet at 06/28/15 0853  . nicotine polacrilex (NICORETTE) gum 2 mg  2 mg Oral PRN Rachael Fee, MD   2 mg at 06/28/15 1314  . OLANZapine zydis (ZYPREXA) disintegrating tablet 5 mg  5 mg Oral TID PRN Rachael Fee, MD   5 mg at 06/28/15 1312  . omega-3 acid ethyl esters (LOVAZA) capsule 2 g  2 g Oral Daily Rachael Fee, MD   2 g at 06/28/15 0853  . pantoprazole (PROTONIX) EC tablet 40 mg  40 mg Oral Daily Sanjuana Kava, NP   40 mg at 06/28/15 0852  . PARoxetine (PAXIL) tablet 20 mg  20 mg Oral Daily Rachael Fee, MD   20 mg at 06/28/15 0854  . thiamine (VITAMIN B-1) tablet 100 mg  100 mg Oral Daily Sanjuana Kava, NP   100 mg at 06/28/15 0853  . traZODone (DESYREL) tablet 50 mg  50 mg Oral QHS PRN Sanjuana Kava, NP   50 mg at 06/24/15 2107    Lab Results: No results found for this or any previous visit (from the past 48 hour(s)).  Blood Alcohol level:  Lab Results  Component Value Date   [facility] <5 06/19/2015   ETH <11 10/01/2013    Physical Findings: AIMS: Facial and Oral Movements Muscles of Facial Expression: None, normal Lips and Perioral Area: None, normal Jaw: None, normal Tongue: None, normal,Extremity Movements Upper (arms, wrists, hands, fingers): None, normal Lower (legs, knees, ankles, toes): None, normal, Trunk Movements Neck, shoulders, hips: None, normal, Overall Severity Severity of abnormal movements (highest score from questions above): None, normal Incapacitation due to abnormal movements: None, normal Patient's awareness  of abnormal movements (rate only patient's report): No Awareness, Dental Status Current problems with teeth and/or dentures?: No Does patient usually wear dentures?: No  CIWA:  CIWA-Ar Total: 0 COWS:     Musculoskeletal: Strength & Muscle Tone: within normal limits Gait & Station: normal Patient leans: normal  Psychiatric Specialty Exam: Review of Systems  Constitutional: Negative.   HENT: Negative.   Eyes: Negative.   Respiratory: Negative.   Cardiovascular: Negative.  Gastrointestinal: Negative.   Musculoskeletal: Negative.   Skin: Negative.   Neurological: Negative.   Endo/Heme/Allergies: Negative.   Psychiatric/Behavioral: Positive for depression and substance abuse. The patient is nervous/anxious.     Blood pressure 128/88, pulse 104, temperature 97.7 F (36.5 C), temperature source Oral, resp. rate 16, height 5' 9.5" (1.765 m), weight 70.308 kg (155 lb).Body mass index is 22.57 kg/(m^2).  General Appearance: Fairly Groomed  Patent attorney::  Fair  Speech:  Clear and Coherent  Volume:  Normal  Mood:  Anxious and worried  Affect:  anxious worried  Thought Process:  Coherent and Goal Directed  Orientation:  Full (Time, Place, and Person)  Thought Content:  symptoms events worries concerns  Suicidal Thoughts:  No  Homicidal Thoughts:  No  Memory:  Immediate;   Fair Recent;   Fair Remote;   Fair  Judgement:  Fair  Insight:  Present and Shallow  Psychomotor Activity:  Restlessness  Concentration:  Fair  Recall:  [employer] of Knowledge:Fair  Language: Fair  Akathisia:  No  Handed:  Right  AIMS (if indicated):     Assets:  Desire for Improvement  ADL's:  Intact  Cognition: WNL  Sleep:  Number of Hours: 6.5   Treatment Plan Summary: Daily contact with patient to assess and evaluate symptoms and progress in treatment and Medication management Supportive approach/coping skills Polysubstance dependence: work a relapse prevention plan Depression; Paxil 20 mg with  Abilify augmentation Mood instability; Abilify 10 mg  Anergia; continue trial with Provigil Acute anxiety/agitation; trial with Zyprexa Zydis 5 mg  Work with CBT/mindfulness Dacia Capers A, MD 06/28/2015, 4:20 PM

## 2015-06-28 NOTE — [facility] Group Notes (Signed)
Pt did not attended AA meeting.  Joshua Davila, MHT

## 2015-06-28 NOTE — [facility] Group Notes (Signed)
[facility]   06/28/2015 1:19 PM  Participation Quality:  Appropriate   Mood/Affect:  Appropriate  Depression Rating:  "low"   Anxiety Rating:  "anxious to leave."   Thoughts of Suicide:  No Will you contract for safety?   NA  Current AVH:  No  Plan for Discharge/Comments:  Pt reports that he is planning to leave on Saturday morning (plan confirmed by Dr. Dub MikesLugo) and his mother will pick him up at 9am. Pt reports poor sleep due to "missing my dog." Pt [facility] and has been given housing resources for that area. No other concerns reported.   Transportation Means: mother   Supports: mother  Counselling psychologistmart, PaoliHeather LCSW

## 2015-06-28 NOTE — Progress Notes (Signed)
D:Pt rates anxiety as a 5 and depression as a 0 on 0-10 scale. Pt talks from one subject to another going from PTSD in the service and wanting to be a girl to saying he could be a blue devil. Pt reports that he has bitterness, resentment and anger toward his spouse due to his spouse spending most of his money. Pt refused to complete paper self inventory stating that he had a learning disability and was read to in school. Writer attempted to ask self inventory questions and pt walked away when his mother called on the phone. He denies si and hi thoughts. A:Offered support encouragement and 15 minute checks. R:Safety maintained on the unit.

## 2015-06-28 NOTE — Progress Notes (Signed)
  [facility] :  Will you be returning to the same living situation after discharge:  Yes,  home with mom/patient also provided with low income housing/shelter/boarding house/and extended stay hotel information for [facility] per his request. At discharge, do you have transportation home?: Yes,  pt reports that his mother will pick him up at 9:00AM on Saturday (07/01/15)>plan may change .Dr. Dub MikesLugo is on call and aware of situation Do you have the ability to pay for your medications: Yes,  Medicare  Release of information consent forms completed and submitted to medical records by CSW>  Patient to Follow up at: Follow-up Information    Follow up with [facility].   Why:  If you decide not [facility], please walk in between 8am-9am Monday through Friday for hospital follow-up/medication management/assessment for counseling services.    Contact information:   [address] [place], [place] [address] Phone: [phone] Fax: [phone]      [facility].   Why:  Child psychotherapistocial worker [facility]. She confirmed that Dr. Noland FordyceMarx will call you directly after discharge with appt time and date. During that appt, the office will be able to connect you with a counselor as well.   Contact information:   [address] [place], [place] [address] Phone: [phone] ext: [phone] Fax: [phone]    Patient refused all other referrals and declined referral for inpatient treatment. Pt provided with AA/NA list for [place] and pamphlet to the [employer] for additional support.   Next level of care provider has access to [facility] Link: No.   Safety Planning and Suicide Prevention discussed: Yes,  SPE completed with pt's mother. SPI pamphlet and mobile crisis information provided to pt and he was encouraged to share information with support network.   Have you used any form of tobacco in  the last 30 days? (Cigarettes, Smokeless Tobacco, Cigars, and/or Pipes): Yes  Has patient been referred to the Quitline?: Patient refused referral  Patient has been referred for addiction treatment: Yes-see above.   Smart, Jomel Whittlesey LCSW 06/28/2015, 11:44 AM

## 2015-06-28 NOTE — Progress Notes (Signed)
Patient ID: Joshua Davila, male   DOB: 1980/11/16, 35 y.o.   MRN: [id number] PER STATE REGULATIONS 482.30  THIS CHART WAS REVIEWED FOR MEDICAL NECESSITY WITH RESPECT TO THE PATIENT'S ADMISSION/ DURATION OF STAY.  NEXT REVIEW DATE: 07/02/2015  Willa RoughJENNIFER JONES Khoen Genet, RN, BSN CASE MANAGER

## 2015-06-28 NOTE — Progress Notes (Signed)
Patient ID: Joshua Davila, male   DOB: 05-Feb-1981, 35 y.o.   MRN: [id number] D: Client in bed "I took that medicine for anxiety, it put a wholape on me" "doing okay, depressed about not being able to go outside or listen to music" "the antianxiety medication kept me from being stressed" "we painted today, that was nice" : "trying to follow y'all guidance tonight. A: Writer provided emotional support, reviewed medications, administered as directed. Staff will monitor q3415min for safety R: Client is safe on the unit.

## 2015-06-28 NOTE — Tx Team (Signed)
Interdisciplinary Treatment Plan Update (Adult) Date: 06/28/2015    Time Reviewed: 9:30 AM  Progress in Treatment: Attending groups: Yes Participating in groups: Yes Taking medication as prescribed: Yes Tolerating medication: Yes Family/Significant other contact made: Yes, SPE completed with pt's mother. Several conversations with mother and CSW (collateral information/development of aftercare plan).  Patient understands diagnosis: Yes Discussing patient identified problems/goals with staff: Yes Medical problems stabilized or resolved: Yes Denies suicidal/homicidal ideation: Yes Issues/concerns per patient self-inventory: Yes Other:  New problem(s) identified: N/A  Discharge Plan or Barriers: Pt agreeable to [facility] as back up and [facility]-he is aware that Dr. Pascal Lux will be calling him on Monday directly with appt as she schedules her own appt. He will meet with Social worker during that appt as well who will link him with a counselor that fits his needs.   Reason for Continuation of Hospitalization:  Medication management   Comments: N/A  Estimated length of stay: 1 day (tentative discharge scheduled for Saturday morning at 9am).     Patient is a 35 year old transgender male with a diagnosis of Substance Induced Mood Disorder. Pt presented to the hospital IVC'd for threatening husband and substance abuse issues. Pt reports primary trigger(s) for admission was marital issues and substance abuse. Patient will benefit from crisis stabilization, medication evaluation, group therapy and psycho education in addition to case management for discharge planning. At discharge, it is recommended that Pt remain compliant with established discharge plan and continued treatment.   Review of initial/current patient goals per problem list:  1. Goal(s): Patient will participate in aftercare plan   Met: Yes   Target date: 3-5 days post admission date   As evidenced by: Patient will  participate within aftercare plan AEB aftercare provider and housing plan at discharge being identified.   3/31: Goal not met: CSW assessing for appropriate referrals for pt and will have follow up secured prior to d/c.   4/7: goal met. Pt to return home with his mother until long term living plan can be made-follow-up in [facility] if he stays in [place], [place].   2. Goal (s): Patient will exhibit decreased depressive symptoms and suicidal ideations.   Met: Yes   Target date: 3-5 days post admission date   As evidenced by: Patient will utilize self rating of depression at 3 or below and demonstrate decreased signs of depression or be deemed stable for discharge by MD.  3/31: Goal not met: Pt presents with flat affect and depressed mood.  Pt admitted with depression rating of 10.  Pt to show decreased sign of depression and a rating of 3 or less before d/c.     4/7: Goal met. Pt rates depression as low. Denies SI/HI/AVH.    3. Goal(s): Patient will demonstrate decreased signs and symptoms of anxiety.   Met: Yes   Target date: 3-5 days post admission date   As evidenced by: Patient will utilize self rating of anxiety at 3 or below and demonstrated decreased signs of anxiety, or be deemed stable for discharge by MD  3/31: Goal not met: Pt presents with anxious mood and affect.  Pt admitted with anxiety rating of 10.  Pt to show decreased sign of anxiety and a rating of 3 or less before d/c.   4/7: Goal met. Pt rates anxiety as 3/10 this morning.    4. Goal(s): Patient will demonstrate decreased signs of withdrawal due to substance abuse   Met: Yes  Target date: 3-5 days post admission date   As evidenced by: Patient will produce a CIWA/COWS score of 0, have stable vitals signs, and no symptoms of withdrawal  3/31: Goal met. No withdrawal symptoms reported at this time per medical chart.   Attendees: Patient:     Family:     Physician:   Dr. Carlton Adam, MD 06/28/2015 11:55 AM   Nursing: Jasmine Awe RN  06/28/2015 11:55 AM   Clinical Social Worker: Maxie Better, LCSW 06/28/2015 11:55 AM   Clinical Social Worker:Lauren Madie Reno 06/28/2015 11:55 AM   Other:  Gerline Legacy Nurse Case Manager 06/28/2015 11:55 AM   Other:  Agustina Caroli, Samuel Jester, NP 06/28/2015 11:55 AM   Other:     Other: Norberto Sorenson, P4CC 06/28/2015 11:55 AM   Other:    Other:      Tilden Fossa, Charlo Clinical Social Worker [facility] [phone]

## 2015-06-28 NOTE — Progress Notes (Signed)
Patient ID: Charlynn GrimesJames B Maina, male   DOB: 07-25-1980, 35 y.o.   MRN: [id number] D: Client visible on the unit, seen in dayroom watching TV and on the phone. A: Writer introduced self to client, encouraged client to verbalize any concerns. Staff will monitor q10515min for safety. R: Client is safe on the unit, no concerns verbalized.

## 2015-06-28 NOTE — Plan of Care (Signed)
Problem: Alteration in mood & ability to function due to Goal: LTG-Patient demonstrates decreased signs of withdrawal (Patient demonstrates decreased signs of withdrawal to the point the patient is safe to return home and continue treatment in an outpatient setting)  Outcome: Progressing No withdrawals observed or expressed.

## 2015-06-28 NOTE — [facility] Group Notes (Signed)
[facility] LCSW Group Therapy  06/28/2015 1:21 PM  Type of Therapy:  Group Therapy  Participation Level:  Active  Participation Quality:  Intrusive and Redirectable  Affect:  Excited  Cognitive:  Lacking  Insight:  Limited  Engagement in Therapy:  Distracting  Modes of Intervention:  Confrontation, Discussion, Education, Exploration, Problem-solving, Rapport Building, Socialization and Support  Summary of Progress/Problems: Feelings around Relapse. Group members discussed the meaning of relapse and shared personal stories of relapse, how it affected them and others, and how they perceived themselves during this time. Group members were encouraged to identify triggers, warning signs and coping skills used when facing the possibility of relapse. Social supports were discussed and explored in detail. Post Acute Withdrawal Syndrome (handout provided) was introduced and examined. Pt's were encouraged to ask questions, talk about key points associated with PAWS, and process this information in terms of relapse prevention. Joshua FearingJames had difficulty remaining on topic and had to be redirected several times during group. He continues to struggle in the group setting. Tangential/flight of ideas.   Smart, Diara Chaudhari LCSW 06/28/2015, 1:21 PM

## 2015-06-29 MED ORDER — NICOTINE POLACRILEX 2 MG MT GUM: 2.0000 mg | CHEWING_GUM | OROMUCOSAL | Status: DC | PRN

## 2015-06-29 MED ORDER — OLANZAPINE 5 MG PO TBDP: 5.0000 mg | ORAL_TABLET | Freq: Three times a day (TID) | ORAL | Status: DC | PRN

## 2015-06-29 NOTE — [facility] Suicide Risk Assessment (Signed)
[facility] Discharge Suicide Risk Assessment   Principal Problem: Polysubstance dependence including opioid type drug, continuous use, with perceptual disturbance [facility]) Discharge Diagnoses:  Patient Active Problem List   Diagnosis Date Noted  . Polysubstance dependence including opioid drug with daily use (HCC) [F19.20] 06/20/2015  . Polysubstance dependence including opioid type drug, continuous use, with perceptual disturbance (HCC) [F11.222] 06/20/2015  . 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 Benzodiazepine dependence (HCC) [F13.20] 10/04/2013  . Cocaine dependence (HCC) [F14.20] 10/04/2013  . Opioid abuse with opioid-induced mood disorder (HCC) [F11.14] 10/04/2013  . PTSD (post-traumatic stress disorder) [F43.10] 10/03/2013  . Substance induced mood disorder (HCC) [F19.94] 10/03/2013  . Polysubstance dependence (HCC) [F19.20] 10/02/2013    Total Time spent with patient: 20 minutes  Musculoskeletal: Strength & Muscle Tone: within normal limits Gait & Station: normal Patient leans: normal  Psychiatric Specialty Exam: Review of Systems  Constitutional: Negative.   HENT: Negative.   Eyes: Negative.   Respiratory: Negative.   Cardiovascular: Negative.   Gastrointestinal: Negative.   Genitourinary: Negative.   Musculoskeletal: Negative.   Skin: Negative.   Neurological: Negative.   Endo/Heme/Allergies: Negative.   Psychiatric/Behavioral: Positive for substance abuse.    Blood pressure 119/75, pulse 102, temperature 97.5 F (36.4 C), temperature source Oral, resp. rate 18, height 5' 9.5" (1.765 m), weight 70.308 kg (155 lb).Body mass index is 22.57 kg/(m^2).  General Appearance: Fairly Groomed  Patent attorney::  Fair  Speech:  Clear and Coherent409  Volume:  Normal  Mood:  Euthymic  Affect:  Appropriate  Thought Process:  Coherent and Goal Directed  Orientation:  Full (Time, Place, and Person)  Thought Content:  plans as he moves on  Suicidal Thoughts:  No   Homicidal Thoughts:  No  Memory:  Immediate;   Fair Recent;   Fair Remote;   Fair  Judgement:  Fair  Insight:  Present  Psychomotor Activity:  Normal  Concentration:  Fair  Recall:  [employer] of Knowledge:Fair  Language: Fair  Akathisia:  No  Handed:  Right  AIMS (if indicated):     Assets:  Desire for Improvement  Sleep:  Number of Hours: 6.75  Cognition: WNL  ADL's:  Impaired  In full contact with reality. There are no active S/S of withdrawal. There are no active SI plans or intent. He is willing and motivated to pursue outpatient treatment. The police is going to pick him up take him to the house where he plans to pick couple of things and leave. He is going to avoid any confrontation with his husband Mental Status Per Nursing Assessment::   On Admission:  Self-harm thoughts, Self-harm behaviors  Demographic Factors:  Caucasian  Loss Factors: Loss of significant relationship  Historical Factors: none identified  Risk Reduction Factors:   Positive social support  Continued Clinical Symptoms:  Depression:   Comorbid alcohol abuse/dependence Alcohol/Substance Abuse/Dependencies  Cognitive Features That Contribute To Risk:  None    Suicide Risk:  Minimal: No identifiable suicidal ideation.  Patients presenting with no risk factors but with morbid ruminations; may be classified as minimal risk based on the severity of the depressive symptoms  Follow-up Information    Follow up with [facility].   Why:  If you decide not to follow up through the [place], please walk in between 8am-9am Monday through Friday for hospital follow-up/medication management/assessment for counseling services.    Contact information:   [address], [place] [address] Phone: [phone] Fax: [phone]  [facility].   Why:  Child psychotherapistocial worker spoke with Darel [facility]Judy [facility]. She confirmed that Dr. Noland FordyceMarx will call you directly after discharge  with appt time and date. During that appt, the office will be able to connect you with a counselor as well.   Contact information:   [address] [place], [place] [address] Phone: [phone] ext: [phone] Fax: [phone]      Plan Of Care/Follow-up recommendations:  Activity:  as tolerated Diet:  regular Follow up as above Jakorian Marengo A, MD 06/29/2015, 10:04 AM

## 2015-06-29 NOTE — Progress Notes (Signed)
Patient up and visible in milieu. Interacting with peers. Animated affect, mood anxious. Patient reports he is discharging today and is anxious to leave ASAP so that he can get to the bank by noon. He denies pain, physical problems. Refusing to complete self inventory. Patient medicated per orders, emotional support provided. Encouraged to complete self inventory. MD, CM aware of wish to be discharged quickly. Patient informed it will depend on when law enforcement can arrive to transport. He denies SI/HI and remains safe on level III obs.

## 2015-06-29 NOTE — Discharge Summary (Signed)
Physician Discharge Summary Note  Patient:  Joshua Davila is an 35 y.o., male MRN:  [id number] DOB:  Jun 26, 1980 Patient phone:  [phone] (home)  Patient address:   [address] [place] [address],  Total Time spent with patient: 45 minutes  Date of Admission:  06/20/2015 Date of Discharge: 06/29/2015  Reason for Admission: PER HPI-Hershey is a 72 year old Caucasian male. Admitted to the [facility] adult unit from the [facility] under IVC with reports that patient was suicidal & had not been taking his psychotropic medications. He was also using multiple drugs. During this assessment, [name] is observed; restless, disheveled & verbally responsive. There are open skin areas to his arms & face. TRUE reports that he has the habit of picking on his skin. He reports, "I was taken to the hospital yesterday by the police. Apparently, my husband went to the Magistrate office, signed some papers to commit me to the mental health hospital. He told them that I said that I was going to hurt him. I did not threaten to hurt him or myself. I was just going through bad withdrawals from Crystal methamphetamine, Xanax & Wax. I have been using heavily for over 6 months. I started using drugs at the age of 34. I'm taking hormone therapy for my gender re-assignment. I was recently approved for disability. I will need to resume my; Abilify, Sertraline, Truvada, Hormone therapy & Neurontin. I do not want to resume Wellbutrin. I do not want to see my   Principal Problem: Polysubstance dependence including opioid type drug, continuous use, with perceptual disturbance [facility]) Discharge Diagnoses: Patient Active Problem List   Diagnosis Date Noted  . Polysubstance dependence including opioid drug with daily use (HCC) [F19.20] 06/20/2015  . Polysubstance dependence including opioid type drug, continuous use, with perceptual disturbance (HCC) [F11.222] 06/20/2015  . Methamphetamine use disorder, severe [F15.90]  06/20/2015  . Benzodiazepine dependence (HCC) [F13.20] 10/04/2013  . Cocaine dependence (HCC) [F14.20] 10/04/2013  . Opioid abuse with opioid-induced mood disorder (HCC) [F11.14] 10/04/2013  . PTSD (post-traumatic stress disorder) [F43.10] 10/03/2013  . Substance induced mood disorder (HCC) [F19.94] 10/03/2013  . Polysubstance dependence [facility]) [F19.20] 10/02/2013    Past Psychiatric History: See Above  Past Medical History:  Past Medical History  Diagnosis Date  . Schizoaffective disorder (HCC)   . PTSD (post-traumatic stress disorder)   . Depression   . Liver disorder   . Gender identity disorder    History reviewed. No pertinent past surgical history. Family History:  Family History  Problem Relation Age of Onset  . Multiple sclerosis Mother    Family Psychiatric  History: See Above Social History:  History  Alcohol Use  . Yes    Comment: occasional     History  Drug Use  . Yes  . Special: Cocaine, Methamphetamines, Marijuana    Comment: last useof methamphetamines and marijuana today.    Social History   Social History  . Marital Status: Single    Spouse Name: N/A  . Number of Children: N/A  . Years of Education: N/A   Social History Main Topics  . Smoking status: Current Every Day Smoker -- 1.00 packs/day    Types: Cigarettes  . Smokeless tobacco: Never Used  . Alcohol Use: Yes     Comment: occasional  . Drug Use: Yes    Special: Cocaine, Methamphetamines, Marijuana     Comment: last useof methamphetamines and marijuana today.  Marland Kitchen Sexual Activity: Not Currently   Other Topics  Concern  . None   Social History Narrative    Hospital Course:  NAOL ONTIVEROS was admitted for Polysubstance dependence including opioid type drug, continuous use, with perceptual disturbance (HCC)  and crisis management.  Pt was treated discharged with the medications listed below under Medication List.  Medical problems were identified and treated as needed.  Home medications  were restarted as appropriate.  Improvement was monitored by observation and [name] 's daily report of symptom reduction.  Emotional and mental status was monitored by daily self-inventory reports completed by [name] and clinical staff.         [name] was evaluated by the treatment team for stability and plans for continued recovery upon discharge. [name] 's motivation was an integral factor for scheduling further treatment. Employment, transportation, bed availability, health status, family support, and any pending legal issues were also considered during hospital stay. Pt was offered further treatment options upon discharge including but not limited to Residential, Intensive Outpatient, and Outpatient treatment.  [name] will follow up with the services as listed below under Follow Up Information.     Upon completion of this admission the patient was both mentally and medically stable for discharge denying suicidal/homicidal ideation, auditory/visual/tactile hallucinations, delusional thoughts and paranoia.    [name] responded well to treatment with Abilify  and Provigil and Zyprexa without adverse effects.Pt demonstrated improvement without reported or observed adverse effects to the point of stability appropriate for outpatient management. Pertinent labs include:CBC which outpatient follow-up is necessary for lab recheck as mentioned below. Reviewed CBC, CMP, BAL, and UDS+ benzodiazepines, Amphetamines, THC, ; all unremarkable aside from noted exceptions.   Physical Findings: AIMS: Facial and Oral Movements Muscles of Facial Expression: None, normal Lips and Perioral Area: None, normal Jaw: None, normal Tongue: None, normal,Extremity Movements Upper (arms, wrists, hands, fingers): None, normal Lower (legs, knees, ankles, toes): None, normal, Trunk Movements Neck, shoulders, hips: None, normal, Overall Severity Severity of abnormal movements (highest  score from questions above): None, normal Incapacitation due to abnormal movements: None, normal Patient's awareness of abnormal movements (rate only patient's report): No Awareness, Dental Status Current problems with teeth and/or dentures?: No Does patient usually wear dentures?: No  CIWA:  CIWA-Ar Total: 0 COWS:     Musculoskeletal: Strength & Muscle Tone: within normal limits Gait & Station: normal Patient leans: N/A  Psychiatric Specialty Exam: See SRA BY MD Review of Systems  Psychiatric/Behavioral: Negative for suicidal ideas. Depression: stable. The patient is not nervous/anxious.   All other systems reviewed and are negative.   Blood pressure 119/75, pulse 102, temperature 97.5 F (36.4 C), temperature source Oral, resp. rate 18, height 5' 9.5" (1.765 m), weight 70.308 kg (155 lb).Body mass index is 22.57 kg/(m^2).  Have you used any form of tobacco in the last 30 days? (Cigarettes, Smokeless Tobacco, Cigars, and/or Pipes): Yes  Has this patient used any form of tobacco in the last 30 days? (Cigarettes, Smokeless Tobacco, Cigars, and/or Pipes) , No  Blood Alcohol level:  Lab Results  Component Value Date   ETH <5 06/19/2015   ETH <11 10/01/2013    Metabolic Disorder Labs:  No results found for: HGBA1C, MPG No results found for: PROLACTIN No results found for: CHOL, TRIG, HDL, CHOLHDL, VLDL, LDLCALC  See Psychiatric Specialty Exam and Suicide Risk Assessment completed by Attending Physician prior to discharge.  Discharge destination:  Home  Is patient on multiple antipsychotic therapies at discharge:  No   Has Patient had three or more failed trials of antipsychotic monotherapy by history:  No  Recommended Plan for Multiple Antipsychotic Therapies: NA  Discharge Instructions    Activity as tolerated - No restrictions    Complete by:  As directed      Diet general    Complete by:  As directed      Discharge instructions    Complete by:  As directed   Take all  medications as prescribed. Keep all follow-up appointments as scheduled.  Do not consume alcohol or use illegal drugs while on prescription medications. Report any adverse effects from your medications to your primary care provider promptly.  In the event of recurrent symptoms or worsening symptoms, call 911, a crisis hotline, or go to the nearest emergency department for evaluation.            Medication List    STOP taking these medications        buPROPion 150 MG 24 hr tablet  Commonly known as:  WELLBUTRIN XL     Fish Oil 1000 MG Caps     gabapentin 300 MG capsule  Commonly known as:  NEURONTIN     hydrocortisone 2.5 % cream     ibuprofen 600 MG tablet  Commonly known as:  ADVIL,MOTRIN     meloxicam 15 MG tablet  Commonly known as:  MOBIC     MINOXIDIL (TOPICAL) 5 % Soln  Commonly known as:  MINOXIDIL FOR MEN      TAKE these medications      Indication   ARIPiprazole 10 MG tablet  Commonly known as:  ABILIFY  Take 1 tablet (10 mg total) by mouth daily.   Indication:  Mood control     aspirin 81 MG EC tablet  Take 1 tablet (81 mg total) by mouth daily.   Indication:  Mild to Moderate Pain, Heart health     atorvastatin 20 MG tablet  Commonly known as:  LIPITOR  Take 1 tablet (20 mg total) by mouth daily.   Indication:  Inherited Heterozygous Hypercholesterolemia     emtricitabine-tenofovir 200-300 MG tablet  Commonly known as:  TRUVADA  Take 1 tablet by mouth daily. For pre-exposure to HIV   Indication:  Pre-Exposure Prophylaxis of HIV     estradiol 0.05 MG/24HR patch  Commonly known as:  ALORA  Place 1 patch (0.05 mg total) onto the skin daily. For male hormone replacement   Indication:  Deficiency of the Hormone Estrogen     finasteride 5 MG tablet  Commonly known as:  PROSCAR  Take 2 tablets (10 mg total) by mouth daily. For enlarged prostate   Indication:  Enlarged Prostate with Urination Problems     hydrOXYzine 25 MG tablet  Commonly known  as:  ATARAX/VISTARIL  Take 1 tablet (25 mg total) by mouth every 6 (six) hours.   Indication:  Anxiety Neurosis     modafinil 100 MG tablet  Commonly known as:  PROVIGIL  Take 1 tablet (100 mg total) by mouth daily.   Indication:  excessive sleepiness     nicotine 21 mg/24hr patch  Commonly known as:  NICODERM CQ - dosed in mg/24 hours  Place 1 patch (21 mg total) onto the skin daily at 6 (six) AM.   Indication:  Nicotine Addiction     nicotine polacrilex 2 MG gum  Commonly known as:  NICORETTE  Take 1 each (2 mg total) by mouth as needed for smoking cessation.  Indication:  Nicotine Addiction     OLANZapine zydis 5 MG disintegrating tablet  Commonly known as:  ZYPREXA  Take 1 tablet (5 mg total) by mouth 3 (three) times daily as needed (agitation).   Indication:  mood stablilzation     pantoprazole 40 MG tablet  Commonly known as:  PROTONIX  Take 1 tablet (40 mg total) by mouth daily.   Indication:  Gastroesophageal Reflux Disease     PARoxetine 20 MG tablet  Commonly known as:  PAXIL  Take 1 tablet (20 mg total) by mouth daily.   Indication:  Major Depressive Disorder     traZODone 50 MG tablet  Commonly known as:  DESYREL  Take 1 tablet (50 mg total) by mouth at bedtime as needed for sleep.   Indication:  Trouble Sleeping           Follow-up Information    Follow up with [facility].   Why:  If you decide not to follow up through the [place], please walk in between 8am-9am Monday through Friday for hospital follow-up/medication management/assessment for counseling services.    Contact information:   [address], [place] [address] Phone: [phone] Fax: [phone]      Follow up with [facility].   Why:  Child psychotherapist spoke with Darel Hong at [facility] clinic. She confirmed that Dr. Noland Fordyce will call you directly after discharge with appt time and date. During that appt, the office will be able to connect you with a counselor as well.    Contact information:   [address], [place] [address] Phone: [phone] ext: [phone] Fax: [phone]      Follow-up recommendations:  Activity:  as tolerated Diet:  heart healthy Note: Get a lipid profile/Hemoglobin A1C Comments:  Take all medications as prescribed. Keep all follow-up appointments as scheduled.  Do not consume alcohol or use illegal drugs while on prescription medications. Report any adverse effects from your medications to your primary care provider promptly.  In the event of recurrent symptoms or worsening symptoms, call 911, a crisis hotline, or go to the nearest emergency department for evaluation.   Signed: Oneta Rack, NP 06/29/2015, 9:41 AM

## 2015-06-29 NOTE — Progress Notes (Signed)
Patient verbalizes readiness for discharge. Follow up plan explained, documents provided, Rx's given and all belongings returned. He denies SI/HI and assures this Clinical research associatewriter he will seek assistance should that status change. Patient discharged ambulatory and in stable condition to law enforcement for transport.

## 2015-06-29 NOTE — Progress Notes (Signed)
Patient ID: Joshua GrimesJames B Davila, male   DOB: Nov 25, 1980, 35 y.o.   MRN: [id number] Client's mother concerned that he would be discharged tomorrow with no where to go. Writer reviewed physician note and reported to mother no discharge orders written for tomorrow. Client's mother requesting to speak to SW, reported that a note would be left to that effect.

## 2015-06-29 NOTE — [facility] Group Notes (Signed)
[facility] Group Notes: (Clinical Social Work)   06/29/2015      Type of Therapy:  Group Therapy   Participation Level:  Did Not Attend - discharged in the middle of group   Ambrose MantleMareida Grossman-Orr, LCSW 06/29/2015, 12:34 PM

## 2015-07-03 DIAGNOSIS — H1013 Acute atopic conjunctivitis, bilateral: Secondary | ICD-10-CM | POA: Diagnosis not present

## 2015-07-03 DIAGNOSIS — H40033 Anatomical narrow angle, bilateral: Secondary | ICD-10-CM | POA: Diagnosis not present

## 2015-09-12 ENCOUNTER — Encounter (HOSPITAL_COMMUNITY): Payer: Self-pay

## 2015-09-12 ENCOUNTER — Emergency Department (HOSPITAL_COMMUNITY)
Admission: EM | Admit: 2015-09-12 | Discharge: 2015-09-15 | Disposition: A | Discharge status: Other Acute Inpatient Hospital | Payer: Medicare Other | Attending: Emergency Medicine | Admitting: Emergency Medicine

## 2015-09-12 DIAGNOSIS — F558 Abuse of other non-psychoactive substances: Secondary | ICD-10-CM | POA: Diagnosis not present

## 2015-09-12 DIAGNOSIS — F1520 Other stimulant dependence, uncomplicated: Secondary | ICD-10-CM | POA: Diagnosis present

## 2015-09-12 DIAGNOSIS — F259 Schizoaffective disorder, unspecified: Secondary | ICD-10-CM | POA: Diagnosis not present

## 2015-09-12 DIAGNOSIS — F649 Gender identity disorder, unspecified: Secondary | ICD-10-CM | POA: Diagnosis not present

## 2015-09-12 DIAGNOSIS — F918 Other conduct disorders: Secondary | ICD-10-CM | POA: Insufficient documentation

## 2015-09-12 DIAGNOSIS — F329 Major depressive disorder, single episode, unspecified: Secondary | ICD-10-CM | POA: Insufficient documentation

## 2015-09-12 DIAGNOSIS — F1290 Cannabis use, unspecified, uncomplicated: Secondary | ICD-10-CM | POA: Diagnosis not present

## 2015-09-12 DIAGNOSIS — F1910 Other psychoactive substance abuse, uncomplicated: Secondary | ICD-10-CM

## 2015-09-12 DIAGNOSIS — F1490 Cocaine use, unspecified, uncomplicated: Secondary | ICD-10-CM | POA: Insufficient documentation

## 2015-09-12 DIAGNOSIS — F4310 Post-traumatic stress disorder, unspecified: Secondary | ICD-10-CM | POA: Diagnosis not present

## 2015-09-12 DIAGNOSIS — Z79899 Other long term (current) drug therapy: Secondary | ICD-10-CM | POA: Insufficient documentation

## 2015-09-12 DIAGNOSIS — R45851 Suicidal ideations: Secondary | ICD-10-CM | POA: Diagnosis not present

## 2015-09-12 DIAGNOSIS — F1424 Cocaine dependence with cocaine-induced mood disorder: Secondary | ICD-10-CM | POA: Diagnosis not present

## 2015-09-12 DIAGNOSIS — F19288 Other psychoactive substance dependence with other psychoactive substance-induced disorder: Secondary | ICD-10-CM | POA: Diagnosis present

## 2015-09-12 DIAGNOSIS — F17210 Nicotine dependence, cigarettes, uncomplicated: Secondary | ICD-10-CM | POA: Insufficient documentation

## 2015-09-12 DIAGNOSIS — F11222 Opioid dependence with intoxication with perceptual disturbance: Secondary | ICD-10-CM | POA: Diagnosis not present

## 2015-09-12 LAB — COMPREHENSIVE METABOLIC PANEL
ALBUMIN: 5 g/dL (ref 3.5–5.0)
ALT: 44 U/L (ref 17–63)
ANION GAP: 8 (ref 5–15)
AST: 42 U/L — ABNORMAL HIGH (ref 15–41)
Alkaline Phosphatase: 52 U/L (ref 38–126)
BUN: 17 mg/dL (ref 6–20)
CHLORIDE: 104 mmol/L (ref 101–111)
CO2: 25 mmol/L (ref 22–32)
Calcium: 9.5 mg/dL (ref 8.9–10.3)
Creatinine, Ser: 0.7 mg/dL (ref 0.61–1.24)
GFR calc non Af Amer: >60 mL/min (ref >60)
GLUCOSE: 104 mg/dL — AB (ref 65–99)
POTASSIUM: 3.7 mmol/L (ref 3.5–5.1)
SODIUM: 137 mmol/L (ref 135–145)
Total Bilirubin: 1.4 mg/dL — ABNORMAL HIGH (ref 0.3–1.2)
Total Protein: 8 g/dL (ref 6.5–8.1)

## 2015-09-12 LAB — RAPID URINE DRUG SCREEN, HOSP PERFORMED
Amphetamines: POSITIVE — AB
BARBITURATES: POSITIVE — AB
BENZODIAZEPINES: NOT DETECTED
COCAINE: NOT DETECTED
OPIATES: NOT DETECTED
TETRAHYDROCANNABINOL: POSITIVE — AB

## 2015-09-12 LAB — CBC
HEMATOCRIT: 39.1 % (ref 39.0–52.0)
HEMOGLOBIN: 13.8 g/dL (ref 13.0–17.0)
MCH: 29.2 pg (ref 26.0–34.0)
MCHC: 35.3 g/dL (ref 30.0–36.0)
MCV: 82.8 fL (ref 78.0–100.0)
Platelets: 206 10*3/uL (ref 150–400)
RBC: 4.72 MIL/uL (ref 4.22–5.81)
RDW: 14.2 % (ref 11.5–15.5)
WBC: 6.8 10*3/uL (ref 4.0–10.5)

## 2015-09-12 LAB — ETHANOL: Alcohol, Ethyl (B): <5 mg/dL (ref <5)

## 2015-09-12 LAB — ACETAMINOPHEN LEVEL

## 2015-09-12 LAB — SALICYLATE LEVEL

## 2015-09-12 MED ORDER — ATORVASTATIN CALCIUM 20 MG PO TABS
20.0000 mg | ORAL_TABLET | Freq: Every day | ORAL | Status: DC
  Administered 2015-09-12 – 2015-09-15 (×4): 20 mg via ORAL
  Filled 2015-09-12 (×4): qty 1

## 2015-09-12 MED ORDER — ACETAMINOPHEN 325 MG PO TABS
650.0000 mg | ORAL_TABLET | ORAL | Status: DC | PRN
  Administered 2015-09-13: 650 mg via ORAL
  Filled 2015-09-12: qty 2

## 2015-09-12 MED ORDER — TRAZODONE HCL 50 MG PO TABS
50.0000 mg | ORAL_TABLET | Freq: Every evening | ORAL | Status: DC | PRN
  Filled 2015-09-12: qty 1

## 2015-09-12 MED ORDER — LORAZEPAM 1 MG PO TABS: 1.0000 mg | ORAL_TABLET | Freq: Three times a day (TID) | ORAL | Status: DC | PRN

## 2015-09-12 MED ORDER — EMTRICITABINE-TENOFOVIR AF 200-25 MG PO TABS
1.0000 | ORAL_TABLET | Freq: Every day | ORAL | Status: DC
  Administered 2015-09-13: 1 via ORAL
  Filled 2015-09-12: qty 1

## 2015-09-12 MED ORDER — ASPIRIN EC 81 MG PO TBEC
81.0000 mg | DELAYED_RELEASE_TABLET | Freq: Every day | ORAL | Status: DC
  Administered 2015-09-13 – 2015-09-15 (×3): 81 mg via ORAL
  Filled 2015-09-12 (×3): qty 1

## 2015-09-12 MED ORDER — OLANZAPINE 5 MG PO TBDP
5.0000 mg | ORAL_TABLET | Freq: Three times a day (TID) | ORAL | Status: DC | PRN
  Administered 2015-09-15 (×2): 5 mg via ORAL
  Filled 2015-09-12 (×2): qty 1

## 2015-09-12 MED ORDER — FINASTERIDE 5 MG PO TABS
10.0000 mg | ORAL_TABLET | Freq: Every day | ORAL | Status: DC
  Administered 2015-09-13 – 2015-09-15 (×3): 10 mg via ORAL
  Filled 2015-09-12 (×4): qty 2

## 2015-09-12 MED ORDER — NICOTINE 21 MG/24HR TD PT24
21.0000 mg | MEDICATED_PATCH | Freq: Every day | TRANSDERMAL | Status: DC
  Filled 2015-09-12 (×2): qty 1

## 2015-09-12 MED ORDER — PANTOPRAZOLE SODIUM 40 MG PO TBEC
40.0000 mg | DELAYED_RELEASE_TABLET | Freq: Every day | ORAL | Status: DC
  Administered 2015-09-12 – 2015-09-15 (×4): 40 mg via ORAL
  Filled 2015-09-12 (×4): qty 1

## 2015-09-12 MED ORDER — HYDROXYZINE HCL 25 MG PO TABS
25.0000 mg | ORAL_TABLET | Freq: Four times a day (QID) | ORAL | Status: DC
  Administered 2015-09-12 – 2015-09-15 (×6): 25 mg via ORAL
  Filled 2015-09-12 (×9): qty 1

## 2015-09-12 MED ORDER — ESTRADIOL 0.05 MG/24HR TD PTWK: 0.0500 mg | MEDICATED_PATCH | TRANSDERMAL | Status: DC

## 2015-09-12 MED ORDER — ARIPIPRAZOLE 10 MG PO TABS
10.0000 mg | ORAL_TABLET | Freq: Every day | ORAL | Status: DC
  Administered 2015-09-12 – 2015-09-13 (×2): 10 mg via ORAL
  Filled 2015-09-12 (×4): qty 1

## 2015-09-12 MED ORDER — PAROXETINE HCL 20 MG PO TABS
20.0000 mg | ORAL_TABLET | Freq: Every day | ORAL | Status: DC
  Administered 2015-09-12 – 2015-09-15 (×4): 20 mg via ORAL
  Filled 2015-09-12 (×4): qty 1

## 2015-09-12 NOTE — BH Assessment (Addendum)
Tele Assessment Note   Joshua Davila is an 35 y.o. male. Pt was IVCd by his husCharlynn Grimesband for bizarre behaviors. Pt was paranoid and not oriented. Pt reports a conflict with his husband. Pt could not answer assessment questions. Pt's speech was tangential. Pt had flight of ideas. Pt discussed riding sharks. Pt was answering to internal stimuli. Pt has been diagnosed with Schizophrenia, PTSD, and Gender Dysphoria.   Writer consulted with [name], [name]. Per [name], [name] Pt meets inpatient criteria. 500 hall recommends.   Diagnosis:  F20.81 Schizophrenia; F43.10 PTSD  Past Medical History:  Past Medical History  Diagnosis Date  . Schizoaffective disorder (HCC)   . PTSD (post-traumatic stress disorder)   . Depression   . Liver disorder   . Gender identity disorder     History reviewed. No pertinent past surgical history.  Family History:  Family History  Problem Relation Age of Onset  . Multiple sclerosis Mother     Social History:  reports that he has been smoking Cigarettes.  He has been smoking about 1.00 pack per day. He has never used smokeless tobacco. He reports that he drinks alcohol. 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  Additional Social History:  Alcohol / Drug Use Pain Medications: Pt denies Prescriptions: Polysubstance abuse "Pt could not report" Over the Counter: Pt denies History of alcohol / drug use?: Yes Longest period of sobriety (when/how long): unknown Substance #1 Name of Substance 1: Marijuana 1 - Age of First Use: unknown 1 - Amount (size/oz): unknown 1 - Frequency: daily 1 - Duration: ongoing 1 - Last Use / Amount: unknown  CIWA: CIWA-Ar BP: 131/90 mmHg Pulse Rate: 82 COWS:    PATIENT STRENGTHS: (choose at least two) Average or above average intelligence Communication skills  Allergies:  Allergies  Allergen Reactions  . Penicillins Rash  . Sulfa Antibiotics Rash    Home Medications:  (Not in a  hospital admission)  OB/GYN Status:  No LMP for male patient.  General Assessment Data Location of Assessment: [facility] ED TTS Assessment: In system Is this a Tele or Face-to-Face Assessment?: Face-to-Face Is this an Initial Assessment or a Re-assessment for this encounter?: Initial Assessment Marital status: Married [name] name: NA Is patient pregnant?: No Pregnancy Status: No Living Arrangements: Spouse/significant other Can pt return to current living arrangement?: Yes Admission Status: Voluntary Is patient capable of signing voluntary admission?: Yes Referral Source: Self/Family/Friend Insurance type: Medicare     Crisis Care Plan Living Arrangements: Spouse/significant other Legal Guardian: Other: (self) [facility] Name of Therapist: [facility] [place]  Education Status Is patient currently in school?: Yes Current Grade: NA Highest grade of school patient has completed: unknown Name of school: NA Contact person: NA  Risk to self with the past 6 months Suicidal Ideation: No Has patient been a risk to self within the past 6 months prior to admission? : No Suicidal Intent: No Has patient had any suicidal intent within the past 6 months prior to admission? : No Is patient at risk for suicide?: No Suicidal Plan?: No Has patient had any suicidal plan within the past 6 months prior to admission? : No Access to Means: No What has been your use of drugs/alcohol within the last 12 months?: pt reports but cannot disclose name Previous Attempts/Gestures: No How many times?: 0 Other Self Harm Risks: SA Triggers for Past Attempts: None known Intentional Self Injurious Behavior: None Family Suicide History: No Recent stressful life event(s): Conflict (  Comment) (conflcit with husband) Persecutory voices/beliefs?: No Depression: Yes Depression Symptoms: Tearfulness, Loss of interest in usual pleasures Substance abuse history and/or treatment for substance abuse?:  Yes Suicide prevention information given to non-admitted patients: Not applicable  Risk to Others within the past 6 months Homicidal Ideation: No Does patient have any lifetime risk of violence toward others beyond the six months prior to admission? : No Thoughts of Harm to Others: No Current Homicidal Intent: No Current Homicidal Plan: No Access to Homicidal Means: No Identified Victim: NA History of harm to others?: No Assessment of Violence: None Noted Violent Behavior Description: NA Does patient have access to weapons?: No Criminal Charges Pending?: No Does patient have a court date: No Is patient on probation?: No  Psychosis Hallucinations: Auditory, Visual Delusions:  (paranoid)  Mental Status Report Appearance/Hygiene: Unremarkable Eye Contact: Fair Motor Activity: Freedom of movement Speech: Logical/coherent Level of Consciousness: Alert Mood: Anxious Affect: Angry, Anxious Anxiety Level: Moderate Thought Processes: Coherent, Relevant Judgement: Unimpaired Orientation: Not oriented Obsessive Compulsive Thoughts/Behaviors: Unable to Assess  Cognitive Functioning Concentration: Unable to Assess Memory: Unable to Assess IQ: Average Insight: Unable to Assess Impulse Control: Unable to Assess Appetite: Fair Weight Loss: 0 Weight Gain: 0 Sleep: Decreased Total Hours of Sleep: 0 Vegetative Symptoms: None  ADLScreening [facility] Assessment Services) Patient's cognitive ability adequate to safely complete daily activities?: Yes Patient able to express need for assistance with ADLs?: Yes Independently performs ADLs?: Yes (appropriate for developmental age)  Prior Inpatient Therapy Prior Inpatient Therapy: Yes Prior Therapy Dates: uknown Prior Therapy Facilty/Provider(s): unknown Reason for Treatment: unknown  Prior Outpatient Therapy Prior Outpatient Therapy: Yes Prior Therapy Dates: uknown Prior Therapy Facilty/Provider(s): uknown Reason for Treatment:  unknown Does patient have an ACCT team?: No Does patient have Intensive In-House Services?  : No Does patient have [facility] services? : No Does patient have P4CC services?: No  ADL Screening (condition at time of admission) Patient's cognitive ability adequate to safely complete daily activities?: Yes Is the patient deaf or have difficulty hearing?: No Does the patient have difficulty seeing, even when wearing glasses/contacts?: No Does the patient have difficulty concentrating, remembering, or making decisions?: No Patient able to express need for assistance with ADLs?: Yes Does the patient have difficulty dressing or bathing?: No Independently performs ADLs?: Yes (appropriate for developmental age) Does the patient have difficulty walking or climbing stairs?: No Weakness of Legs: None Weakness of Arms/Hands: None       Abuse/Neglect Assessment (Assessment to be complete while patient is alone) Physical Abuse: Denies Verbal Abuse: Denies Sexual Abuse: Denies Exploitation of patient/patient's resources: Denies Self-Neglect: Denies     Merchant navy officerAdvance Directives (For Healthcare) Does patient have an advance directive?: No Would patient like information on creating an advanced directive?: No - patient declined information    Additional Information 1:1 In Past 12 Months?: No CIRT Risk: No Elopement Risk: No Does patient have medical clearance?: Yes     Disposition:  Disposition Initial Assessment Completed for this Encounter: Yes Disposition of Patient: Inpatient treatment program Type of inpatient treatment program: Adult  Emmit PomfretLevette,Sofiah Lyne D 09/12/2015 5:32 PM

## 2015-09-12 NOTE — ED Notes (Signed)
Bed: [facility]40 Expected date:  Expected time:  Means of arrival:  Comments: Triage 4 

## 2015-09-12 NOTE — ED Notes (Signed)
Patient needs an examination of recommendation to determine necessity for involuntary commitment if IVC petition is upheld.

## 2015-09-12 NOTE — ED Notes (Signed)
Pt presents after being IVC'd by spouse.  Per IVC paperwork, Pt is a danger to himself and others.  He has been diagnosed w/ schizoaffective disorder, PTSD, gender idenity disorder, and has not seen his doctor in almost a year.  Paperwork sts  Pt daily sts "I wish I could just die" and "my life is nothing and has nothing good."  Also, pt often appears to answering someone when no one is there and has become aggressive toward family.  Pt believes that family wants to hurt or kill him.  Pt has been abusing prescription and illegal drugs.    [employer] reports Pt has had clenched fists and loud talking.

## 2015-09-12 NOTE — ED Notes (Addendum)
Pt reports that his husband "has mental problems and he is always trying to take away what makes me happy" and that is why the IVC paperwork was taken out.  Denies SI/HI/AVH.  Pt sts he has been taking all medications as prescribed.  [facility] doctors [facility].  Pt very agitated.  Sts "I just wanted to eat my dinner.  He always has to mess with my heart."  Pt admits to frequent ETOH, marijuana, and meth use.

## 2015-09-12 NOTE — ED Provider Notes (Signed)
CSN: [id number]     Arrival date & time 09/12/15  1359 History   First MD Initiated Contact with Patient 09/12/15 1559     Chief Complaint  Patient presents with  . IVC   . Suicidal  . Aggressive Behavior     Level 5 caveat due to psychiatric disorder.  The history is provided by the patient.  Patient presents under IVC. Reportedly has been abusing drugs. Reportedly has been making suicidal statements. Patient states that he was on the IVC because his husband has mental problems. Unable to get much history out of him. Reportedly has been making aggressive statements. States he has been using some methamphetamine.  Past Medical History  Diagnosis Date  . Schizoaffective disorder (HCC)   . PTSD (post-traumatic stress disorder)   . Depression   . Liver disorder   . Gender identity disorder    History reviewed. No pertinent past surgical history. Family History  Problem Relation Age of Onset  . Multiple sclerosis Mother    Social History  Substance Use Topics  . Smoking status: Current Every Day Smoker -- 1.00 packs/day    Types: Cigarettes  . Smokeless tobacco: Never Used  . Alcohol Use: Yes     Comment: occasional    Review of Systems  Unable to perform ROS: Psychiatric disorder      Allergies  Penicillins and Sulfa antibiotics  Home Medications   Prior to Admission medications   Medication Sig Start Date End Date Taking? Authorizing Provider  ARIPiprazole (ABILIFY) 10 MG tablet Take 1 tablet (10 mg total) by mouth daily. 06/24/15   Adonis BrookSheila Agustin, NP  aspirin EC 81 MG EC tablet Take 1 tablet (81 mg total) by mouth daily. 06/24/15   Adonis BrookSheila Agustin, NP  atorvastatin (LIPITOR) 20 MG tablet Take 1 tablet (20 mg total) by mouth daily. 06/24/15   Adonis BrookSheila Agustin, NP  emtricitabine-tenofovir (TRUVADA) 200-300 MG tablet Take 1 tablet by mouth daily. 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 For male hormone replacement 06/24/15   Adonis BrookSheila Agustin, NP  finasteride (PROSCAR) 5 MG tablet Take 2 tablets (10 mg total) by mouth daily. For enlarged prostate 06/24/15   Adonis BrookSheila Agustin, NP  hydrOXYzine (ATARAX/VISTARIL) 25 MG tablet Take 1 tablet (25 mg total) by mouth every 6 (six) hours. 06/24/15   Adonis BrookSheila Agustin, NP  modafinil (PROVIGIL) 100 MG tablet Take 1 tablet (100 mg total) by mouth daily. 06/24/15   Adonis BrookSheila Agustin, NP  nicotine (NICODERM CQ - DOSED IN MG/24 HOURS) 21 mg/24hr patch Place 1 patch (21 mg total) onto the skin daily at 6 (six) AM. 06/24/15   Adonis BrookSheila Agustin, NP  nicotine polacrilex (NICORETTE) 2 MG gum Take 1 each (2 mg total) by mouth as needed for smoking cessation. 06/29/15   Oneta Rackanika N Lewis, NP  OLANZapine zydis (ZYPREXA) 5 MG disintegrating tablet Take 1 tablet (5 mg total) by mouth 3 (three) times daily as needed (agitation). 06/29/15   Oneta Rackanika N Lewis, NP  pantoprazole (PROTONIX) 40 MG tablet Take 1 tablet (40 mg total) by mouth daily. 06/24/15   Adonis BrookSheila Agustin, NP  PARoxetine (PAXIL) 20 MG tablet Take 1 tablet (20 mg total) by mouth daily. 06/24/15   Adonis BrookSheila Agustin, NP  traZODone (DESYREL) 50 MG tablet Take 1 tablet (50 mg total) by mouth at bedtime as needed for sleep. 06/24/15   Adonis BrookSheila Agustin, NP   BP  131/90 mmHg  Pulse 82  Temp(Src) 98.2 F (36.8 C) (Oral)  Resp 15  SpO2 99% Physical Exam  Constitutional: He appears well-developed.  HENT:  Head: Atraumatic.  Neck: Neck supple.  Cardiovascular: Normal rate.   Pulmonary/Chest: Effort normal.  Abdominal: Soft.  Musculoskeletal: Normal range of motion. He exhibits no edema.  Neurological: He is alert.  Skin: Skin is warm.  Psychiatric:  Patient with somewhat strange affect.    ED Course  Procedures (including critical care time) Labs Review Labs Reviewed  COMPREHENSIVE METABOLIC PANEL - Abnormal; Notable for the following:    Glucose, Bld 104 (*)    AST 42 (*)    Total Bilirubin 1.4 (*)    All other  components within normal limits  ACETAMINOPHEN LEVEL - Abnormal; Notable for the following:    Acetaminophen (Tylenol), Serum <10 (*)    All other components within normal limits  URINE RAPID DRUG SCREEN, HOSP PERFORMED - Abnormal; Notable for the following:    Amphetamines POSITIVE (*)    Tetrahydrocannabinol POSITIVE (*)    Barbiturates POSITIVE (*)    All other components within normal limits  ETHANOL  SALICYLATE LEVEL  CBC    Imaging Review No results found. I have personally reviewed and evaluated these images and lab results as part of my medical decision-making.   EKG Interpretation None      MDM   Final diagnoses:  Substance abuse  Suicidal ideations    Patient was brought in under IVC. Appears to medically cleared at this time. Polysubstance abuse. To be seen by TTS.    Benjiman CoreNathan Danene Montijo, MD 09/12/15 [phone]

## 2015-09-13 DIAGNOSIS — F1424 Cocaine dependence with cocaine-induced mood disorder: Secondary | ICD-10-CM | POA: Diagnosis present

## 2015-09-13 DIAGNOSIS — R45851 Suicidal ideations: Secondary | ICD-10-CM | POA: Diagnosis not present

## 2015-09-13 DIAGNOSIS — F4310 Post-traumatic stress disorder, unspecified: Secondary | ICD-10-CM

## 2015-09-13 DIAGNOSIS — F11222 Opioid dependence with intoxication with perceptual disturbance: Secondary | ICD-10-CM | POA: Diagnosis not present

## 2015-09-13 MED ORDER — GABAPENTIN 300 MG PO CAPS
300.0000 mg | ORAL_CAPSULE | Freq: Three times a day (TID) | ORAL | Status: DC
  Administered 2015-09-13 – 2015-09-15 (×4): 300 mg via ORAL
  Filled 2015-09-13 (×7): qty 1

## 2015-09-13 MED ORDER — EMTRICITABINE-TENOFOVIR DF 200-300 MG PO TABS
1.0000 | ORAL_TABLET | Freq: Every day | ORAL | Status: DC
  Administered 2015-09-15: 1 via ORAL
  Filled 2015-09-13 (×2): qty 1

## 2015-09-13 NOTE — ED Notes (Signed)
Pt resting in bed no complaints at this time other than being tired. Pt took scheduled medications and went back to bed. Patient noted in room. No complaints, stable, in no acute distress. Q15 minute rounds and monitoring via security cameras continue for safety.

## 2015-09-13 NOTE — BHH Counselor (Signed)
The following facilities have been contacted to seek placement for this pt, with results as noted:  Beds available, information sent, decision pending:  [facility]   [facility]  Tametria Aho McNeil, MA OBS Counselor 

## 2015-09-13 NOTE — Progress Notes (Addendum)
Pt alert and oriented to self and date. Denies SI, HI, AVh and pain. However, pt is disorganized with tangential speech and flight of ideas. Pt could not stay on topic during RN's assessment. Pt told Clinical research associatewriter he just wants to go home to his dog because people will not take care of it. "I got too many enemies out there including the one who brought me here". Compliant with medications as ordered. Slept majority of this shift. Tolerated meals and fluids well when awake. Emotional support, availability and encouragement provided to pt. Q 15 minutes checks remains effective for safety without outburst or self harm gestures thus far.

## 2015-09-13 NOTE — ED Notes (Signed)
Patient is drowsy at this time. Patient unable to fully answer questions. [facility] but does not discuss plan. Patient appears to be responding to internal and external stimuli. Encouragement and support provided and safety maintain. Q 15 min safety checks remain in place.

## 2015-09-13 NOTE — BH Assessment (Signed)
B[facility] Assessment Progress Note  The following facilities have been contacted to seek placement for this pt, with results as noted:  Beds available, information sent, decision pending:  [facility]   At capacity:  [facility]   Arnita Koons, MA Triage Specialist [phone]     
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
## 2015-09-14 MED ORDER — ESTRADIOL 0.05 MG/24HR TD PTWK
0.0500 mg | MEDICATED_PATCH | TRANSDERMAL | Status: DC
  Filled 2015-09-14 (×2): qty 1

## 2015-09-14 NOTE — ED Notes (Signed)
Patient noted in room. No complaints, stable, in no acute distress. Q15 minute rounds and monitoring via security cameras continue for safety. Pt resting and denies need to be here at this time.

## 2015-09-14 NOTE — BH Assessment (Signed)
[facility] Assessment Progress Note   This Clinical research associatewriter spoke with patient this date as patient presented with a somewhat disorganized affect. Patient states he is still having thoughts of self harm but was not specific in reference to a plan. Patient was difficult to understand at times and was very tangential. Patient was difficult to redirected and became agitated with this Clinical research associatewriter. Per Dr. Tenny Crawoss the recommendation of inpatient criteria, 500 H.

## 2015-09-14 NOTE — ED Notes (Signed)
Pt is angry about being here because she would [facility] be at [facility], "Where they know how to help me." She was selective about which medications she would take, so refused some of her medication. Once she raised her voice loudly about being here, and pulled the emergency alarm to be aggravating to staff. She would not talk about why she is here except to say, "My husband called the police."

## 2015-09-14 NOTE — ED Notes (Signed)
Pt came to desk angry about being here, yelling out and using profanities. Pt accepted a cup of water and went back to bed. Pt stating that he wants to be discharged to the [facility] hospital.and that his husband is cheating on him and wants him away because he is a Higher education careers adviserdrug dealer.

## 2015-09-15 ENCOUNTER — Encounter (HOSPITAL_COMMUNITY): Payer: Self-pay

## 2015-09-15 ENCOUNTER — Inpatient Hospital Stay (HOSPITAL_COMMUNITY)
Admission: AD | Admit: 2015-09-15 | Discharge: 2015-09-20 | DRG: 897 | Disposition: A | Discharge status: Other Acute Inpatient Hospital | Payer: Medicare Other | Attending: Psychiatry | Admitting: Psychiatry

## 2015-09-15 DIAGNOSIS — F19950 Other psychoactive substance use, unspecified with psychoactive substance-induced psychotic disorder with delusions: Secondary | ICD-10-CM | POA: Diagnosis not present

## 2015-09-15 DIAGNOSIS — R45851 Suicidal ideations: Secondary | ICD-10-CM

## 2015-09-15 DIAGNOSIS — F1590 Other stimulant use, unspecified, uncomplicated: Secondary | ICD-10-CM | POA: Diagnosis not present

## 2015-09-15 DIAGNOSIS — F649 Gender identity disorder, unspecified: Secondary | ICD-10-CM | POA: Diagnosis present

## 2015-09-15 DIAGNOSIS — F1120 Opioid dependence, uncomplicated: Secondary | ICD-10-CM | POA: Diagnosis present

## 2015-09-15 DIAGNOSIS — F332 Major depressive disorder, recurrent severe without psychotic features: Secondary | ICD-10-CM | POA: Diagnosis present

## 2015-09-15 DIAGNOSIS — F14259 Cocaine dependence with cocaine-induced psychotic disorder, unspecified: Secondary | ICD-10-CM | POA: Diagnosis present

## 2015-09-15 DIAGNOSIS — F1220 Cannabis dependence, uncomplicated: Secondary | ICD-10-CM | POA: Diagnosis present

## 2015-09-15 DIAGNOSIS — F1994 Other psychoactive substance use, unspecified with psychoactive substance-induced mood disorder: Secondary | ICD-10-CM | POA: Diagnosis present

## 2015-09-15 DIAGNOSIS — F1424 Cocaine dependence with cocaine-induced mood disorder: Secondary | ICD-10-CM | POA: Diagnosis not present

## 2015-09-15 DIAGNOSIS — F19929 Other psychoactive substance use, unspecified with intoxication, unspecified: Secondary | ICD-10-CM | POA: Diagnosis present

## 2015-09-15 DIAGNOSIS — F17210 Nicotine dependence, cigarettes, uncomplicated: Secondary | ICD-10-CM | POA: Diagnosis present

## 2015-09-15 DIAGNOSIS — F1420 Cocaine dependence, uncomplicated: Secondary | ICD-10-CM | POA: Diagnosis present

## 2015-09-15 DIAGNOSIS — F1520 Other stimulant dependence, uncomplicated: Secondary | ICD-10-CM | POA: Diagnosis present

## 2015-09-15 DIAGNOSIS — F19959 Other psychoactive substance use, unspecified with psychoactive substance-induced psychotic disorder, unspecified: Secondary | ICD-10-CM | POA: Diagnosis present

## 2015-09-15 DIAGNOSIS — F4310 Post-traumatic stress disorder, unspecified: Secondary | ICD-10-CM | POA: Diagnosis present

## 2015-09-15 MED ORDER — NICOTINE POLACRILEX 2 MG MT GUM
2.0000 mg | CHEWING_GUM | OROMUCOSAL | Status: DC | PRN
  Administered 2015-09-16 – 2015-09-19 (×8): 2 mg via ORAL
  Filled 2015-09-15 (×6): qty 1

## 2015-09-15 MED ORDER — ACETAMINOPHEN 325 MG PO TABS: 650.0000 mg | ORAL_TABLET | ORAL | Status: DC | PRN

## 2015-09-15 MED ORDER — ARIPIPRAZOLE 10 MG PO TABS
10.0000 mg | ORAL_TABLET | Freq: Two times a day (BID) | ORAL | Status: DC
  Filled 2015-09-15: qty 1

## 2015-09-15 MED ORDER — IBUPROFEN 600 MG PO TABS
600.0000 mg | ORAL_TABLET | Freq: Three times a day (TID) | ORAL | Status: DC | PRN
  Administered 2015-09-17 – 2015-09-19 (×2): 600 mg via ORAL
  Filled 2015-09-15 (×2): qty 1

## 2015-09-15 MED ORDER — ONDANSETRON HCL 4 MG PO TABS
4.0000 mg | ORAL_TABLET | Freq: Three times a day (TID) | ORAL | Status: DC | PRN
  Administered 2015-09-17: 4 mg via ORAL
  Filled 2015-09-15: qty 1

## 2015-09-15 MED ORDER — LORAZEPAM 1 MG PO TABS
1.0000 mg | ORAL_TABLET | Freq: Three times a day (TID) | ORAL | Status: DC | PRN
  Administered 2015-09-16 – 2015-09-19 (×7): 1 mg via ORAL
  Filled 2015-09-15 (×7): qty 1

## 2015-09-15 MED ORDER — ALUM & MAG HYDROXIDE-SIMETH 200-200-20 MG/5ML PO SUSP: 30.0000 mL | ORAL | Status: DC | PRN

## 2015-09-15 NOTE — Consult Note (Signed)
[facility] Face-to-Face Psychiatry Consult   Reason for Consult:  Substance abuse with suicidal ideations Referring Physician:  EDP Patient Identification: Joshua Davila MRN:  [id number] Principal Diagnosis: Cocaine dependence with cocaine-induced mood disorder [facility]) Diagnosis:   Patient Active Problem List   Diagnosis Date Noted  . Cocaine dependence with cocaine-induced mood disorder (HCC) [F14.24] 09/13/2015  . Polysubstance dependence including opioid drug with daily use (HCC) [F19.20] 06/20/2015  . Polysubstance dependence including opioid type drug, continuous use, with perceptual disturbance (HCC) [F11.222] 06/20/2015  . 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 Benzodiazepine dependence (HCC) [F13.20] 10/04/2013  . Cocaine dependence (HCC) [F14.20] 10/04/2013  . Opioid abuse with opioid-induced mood disorder (HCC) [F11.14] 10/04/2013  . PTSD (post-traumatic stress disorder) [F43.10] 10/03/2013  . Substance induced mood disorder (HCC) [F19.94] 10/03/2013  . Polysubstance dependence (HCC) [F19.20] 10/02/2013    Total Time spent with patient: 45 minutes  Subjective:   Joshua Davila is a 35 y.o. male patient admitted with suicidal ideations and substance abuse.  HPI:  On admission:  35 y.o. male. Pt was IVCd by his husband for bizarre behaviors. Pt was paranoid and not oriented. Pt reports a conflict with his husband. Pt could not answer assessment questions. Pt's speech was tangential. Pt had flight of ideas. Pt discussed riding sharks. Pt was answering to internal stimuli. Pt has been diagnosed with Schizophrenia, PTSD, and Gender Dysphoria.   Today:  He is irritable and disorganized. He is demanding to go to the [place]. Affect is labile and thoughts very disorganized. Nurse reports he picks and chooses which meds to take  Past Psychiatric History: substance abuse, depression  Risk to Self: Suicidal Ideation: No Suicidal Intent: No Is patient at risk for suicide?:  No Suicidal Plan?: No Access to Means: No What has been your use of drugs/alcohol within the last 12 months?: pt reports but cannot disclose name How many times?: 0 Other Self Harm Risks: SA Triggers for Past Attempts: None known Intentional Self Injurious Behavior: None Risk to Others: Homicidal Ideation: No Thoughts of Harm to Others: No Current Homicidal Intent: No Current Homicidal Plan: No Access to Homicidal Means: No Identified Victim: NA History of harm to others?: No Assessment of Violence: None Noted Violent Behavior Description: NA Does patient have access to weapons?: No Criminal Charges Pending?: No Does patient have a court date: No Prior Inpatient Therapy: Prior Inpatient Therapy: Yes Prior Therapy Dates: uknown Prior Therapy Facilty/Provider(s): unknown Reason for Treatment: unknown Prior Outpatient Therapy: Prior Outpatient Therapy: Yes Prior Therapy Dates: uknown Prior Therapy Facilty/Provider(s): uknown Reason for Treatment: unknown Does patient have an ACCT team?: No Does patient have Intensive In-House Services?  : No Does patient have [facility] services? : No Does patient have P4CC services?: No  Past Medical History:  Past Medical History  Diagnosis Date  . Schizoaffective disorder (HCC)   . PTSD (post-traumatic stress disorder)   . Depression   . Liver disorder   . Gender identity disorder    History reviewed. No pertinent past surgical history. Family History:  Family History  Problem Relation Age of Onset  . Multiple sclerosis Mother    Family Psychiatric  History: none Social History:  History  Alcohol Use  . Yes    Comment: occasional     History  Drug Use  . Yes  . Special: Cocaine, Methamphetamines, Marijuana    Comment: last useof methamphetamines and marijuana yesterday    Social History   Social History  . Marital Status:  Single    Spouse Name: N/A  . Number of Children: N/A  . Years of Education: N/A   Social History  Main Topics  . Smoking status: Current Every Day Smoker -- 1.00 packs/day    Types: Cigarettes  . Smokeless tobacco: Never Used  . Alcohol Use: Yes     Comment: occasional  . Drug Use: Yes    Special: Cocaine, Methamphetamines, Marijuana     Comment: last useof methamphetamines and marijuana yesterday  . Sexual Activity: Not Currently   Other Topics Concern  . None   Social History Narrative   Additional Social History:    Allergies:   Allergies  Allergen Reactions  . Penicillins Swelling and Rash    Has patient had a PCN reaction causing immediate rash, facial/tongue/throat swelling, SOB or lightheadedness with hypotension: yes Has patient had a PCN reaction causing severe rash involving mucus membranes or skin necrosis: no Has patient had a PCN reaction that required hospitalization: yes Has patient had a PCN reaction occurring within the last 10 years: no If all of the above answers are "NO", then may proceed with Cephalosporin use.   . Sulfa Antibiotics Rash    Labs:  No results found for this or any previous visit (from the past 48 hour(s)).  Current Facility-Administered Medications  Medication Dose Route Frequency Provider Last Rate Last Dose  . acetaminophen (TYLENOL) tablet 650 mg  650 mg Oral Q4H PRN Benjiman CoreNathan [name], MD   650 mg at 09/13/15 1300  . ARIPiprazole (ABILIFY) tablet 10 mg  10 mg Oral BID Myrlene Brokereborah R Tyliyah Mcmeekin, MD      . aspirin EC tablet 81 mg  81 mg Oral Daily Benjiman CoreNathan [name], MD   81 mg at 09/15/15 1155  . atorvastatin (LIPITOR) tablet 20 mg  20 mg Oral Daily Benjiman CoreNathan [name], MD   20 mg at 09/15/15 1155  . emtricitabine-tenofovir (TRUVADA) 200-300 MG per tablet 1 tablet  1 tablet Oral Daily Charm RingsJamison Y Lord, NP   1 tablet at 09/15/15 1155  . estradiol (CLIMARA - Dosed in mg/24 hr) patch 0.05 mg  0.05 mg Transdermal Weekly Myrlene Brokereborah R Arvon Schreiner, MD   0 mg at 09/14/15 1231  . finasteride (PROSCAR) tablet 10 mg  10 mg Oral Daily Benjiman CoreNathan [name], MD   10 mg at  09/15/15 1154  . gabapentin (NEURONTIN) capsule 300 mg  300 mg Oral TID Thedore MinsMojeed Akintayo, MD   300 mg at 09/15/15 1155  . hydrOXYzine (ATARAX/VISTARIL) tablet 25 mg  25 mg Oral Q6H Benjiman CoreNathan [name], MD   25 mg at 09/15/15 0934  . nicotine (NICODERM CQ - dosed in mg/24 hours) patch 21 mg  21 mg Transdermal Q0600 Benjiman CoreNathan [name], MD   21 mg at 09/13/15 0819  . OLANZapine zydis (ZYPREXA) disintegrating tablet 5 mg  5 mg Oral TID PRN Benjiman CoreNathan [name], MD   5 mg at 09/15/15 0935  . pantoprazole (PROTONIX) EC tablet 40 mg  40 mg Oral Daily Benjiman CoreNathan [name], MD   40 mg at 09/15/15 1155  . PARoxetine (PAXIL) tablet 20 mg  20 mg Oral Daily Benjiman CoreNathan [name], MD   20 mg at 09/15/15 1156  . traZODone (DESYREL) tablet 50 mg  50 mg Oral QHS PRN Benjiman CoreNathan [name], MD       Current Outpatient Prescriptions  Medication Sig Dispense Refill  . aspirin EC 81 MG EC tablet Take 1 tablet (81 mg total) by mouth daily. 30 tablet 0  . atorvastatin (LIPITOR) 20 MG tablet Take 1 tablet (  20 mg total) by mouth daily. 30 tablet 0  . emtricitabine-tenofovir (TRUVADA) 200-300 MG tablet Take 1 tablet by mouth daily. For pre-exposure to HIV    . estradiol (ALORA) 0.05 MG/24HR patch Place 1 patch (0.05 mg total) onto the skin daily. For male hormone replacement 8 patch 12  . finasteride (PROSCAR) 5 MG tablet Take 2 tablets (10 mg total) by mouth daily. For enlarged prostate (Patient taking differently: Take 15 mg by mouth daily. For enlarged prostate)    . modafinil (PROVIGIL) 100 MG tablet Take 1 tablet (100 mg total) by mouth daily. 30 tablet 0  . nicotine (NICODERM CQ - DOSED IN MG/24 HOURS) 21 mg/24hr patch Place 1 patch (21 mg total) onto the skin daily at 6 (six) AM. (Patient taking differently: Place 21 mg onto the skin daily as needed (nicotine addiction). ) 28 patch 0  . nicotine polacrilex (NICORETTE) 2 MG gum Take 1 each (2 mg total) by mouth as needed for smoking cessation. 100 tablet 0  . OLANZapine zydis (ZYPREXA) 5  MG disintegrating tablet Take 1 tablet (5 mg total) by mouth 3 (three) times daily as needed (agitation). 30 tablet 0  . pantoprazole (PROTONIX) 40 MG tablet Take 1 tablet (40 mg total) by mouth daily. 30 tablet 0  . PARoxetine (PAXIL) 20 MG tablet Take 1 tablet (20 mg total) by mouth daily. 30 tablet 0  . hydrOXYzine (ATARAX/VISTARIL) 25 MG tablet Take 1 tablet (25 mg total) by mouth every 6 (six) hours. (Patient not taking: Reported on 09/12/2015) 30 tablet 0  . [DISCONTINUED] carbamazepine (TEGRETOL XR) 200 MG 12 hr tablet Take 1 tablet (200 mg total) by mouth 2 (two) times daily. For mood stabilization (Patient not taking: Reported on 02/09/2014) 60 tablet 0  . [DISCONTINUED] fluticasone (FLONASE) 50 MCG/ACT nasal spray Place 1 spray into both nostrils daily. For allergies (Patient not taking: Reported on 06/15/2014)  2  . [DISCONTINUED] prazosin (MINIPRESS) 2 MG capsule Take 1 capsule (2 mg total) by mouth at bedtime. For nightmares (Patient not taking: Reported on 02/09/2014) 30 capsule 0    Musculoskeletal: Strength & Muscle Tone: within normal limits Gait & Station: normal Patient leans: N/A  Psychiatric Specialty Exam: Physical Exam  Constitutional: He is oriented to person, place, and time. He appears well-developed and well-nourished.  HENT:  Head: Normocephalic.  Neck: Normal range of motion.  Respiratory: Effort normal.  Musculoskeletal: Normal range of motion.  Neurological: He is alert and oriented to person, place, and time.  Skin: Skin is warm and dry.  Psychiatric: His speech is normal and behavior is normal. His mood appears anxious. His affect is angry and labile. Thought content is paranoid. Cognition and memory are impaired. He expresses impulsivity. He expresses suicidal ideation.    Review of Systems  Constitutional: Negative.   HENT: Negative.   Eyes: Negative.   Respiratory: Negative.   Cardiovascular: Negative.   Gastrointestinal: Negative.   Genitourinary:  Negative.   Musculoskeletal: Negative.   Skin: Negative.   Neurological: Negative.   Endo/Heme/Allergies: Negative.   Psychiatric/Behavioral: Positive for depression, suicidal ideas, memory loss and substance abuse. The patient is nervous/anxious.     Blood pressure 117/78, pulse 89, temperature 97.8 F (36.6 C), temperature source Oral, resp. rate 16, SpO2 99 %.There is no weight on file to calculate BMI.  General Appearance: Disheveled  Eye Contact:  Fair  Speech:  Normal Rate  Volume:  Increased  Mood:  Anxious, Depressed and Irritable  Affect:  Blunt  Thought Process: disorganized with loose associations   Orientation:  Full (Time, Place, and Person)  Thought Content:  Rumination  Suicidal Thoughts:  Yes.  with intent/plan  Homicidal Thoughts:  No  Memory:  Immediate;   Fair Recent;   Fair Remote;   Fair  Judgement:  Impaired  Insight:  Lacking  Psychomotor Activity:  Increased and restless  Concentration:  Concentration: Fair and Attention Span: Fair  Recall:  [employer]is[employer]Fair  Fund of Knowledge:  Fair  Language:  Fair  Akathisia:  No  Handed:  Right  AIMS (if indicated):     Assets:  Housing Intimacy Leisure Time Physical Health Resilience Social Support  ADL's:  Intact  Cognition:  Impaired,  Mild  Sleep:        Treatment Plan Summary: Daily contact with patient to assess and evaluate symptoms and progress in treatment, Medication management and Plan cocaine dependence with cocaine induced mood disorder:  -Crisis stabilization -Medication management:  Continue medical medications along with Paxil 20 mg daily for depression, Trazodone 50 mg PRN sleep, Zyprexa 5 mg TID PRN agitation and Vistaril 25 mg every six hours PRN anxiety.Abilify will be increased to 10 mg bid for psychosis and Gabapentin 300 mg TID for irritability and withdrawal. -Individual and substance abuse counseling  Disposition: Recommend psychiatric Inpatient admission when medically cleared.  Diannia RuderOSS,  Keawe Marcello, MD 09/15/2015 1:06 PM

## 2015-09-15 NOTE — ED Notes (Signed)
Patient noted in room. No complaints, stable, in no acute distress. Q15 minute rounds and monitoring via security cameras continue for safety. 

## 2015-09-15 NOTE — ED Notes (Signed)
[facility]  Was told they would call back.

## 2015-09-15 NOTE — ED Notes (Signed)
[facility] contacted for transport 

## 2015-09-15 NOTE — ED Notes (Signed)
Pt is very labile.  He became agitated and slammed phone down and hit the phone with his wrist.  He has been tearful at times and angry at times.  He refuses his HIV medication stating that he needs a test first.  He has flight of ideas and very tangential.  15 minute checks and video monitoring continue.

## 2015-09-15 NOTE — Tx Team (Signed)
Initial Interdisciplinary Treatment Plan   PATIENT STRESSORS: Financial difficulties Health problems Legal issue Marital or family conflict Medication change or noncompliance Occupational concerns Substance abuse   PATIENT STRENGTHS: Capable of independent living Communication skills   PROBLEM LIST: Problem List/Patient Goals Date to be addressed Date deferred Reason deferred Estimated date of resolution  "I need a better network for my appointments" 09/15/2015     "I need help with my drug use" 09/15/2015     Substance Abuse 09/15/2015     Psychosis 09/15/2015                                    DISCHARGE CRITERIA:  Ability to meet basic life and health needs Adequate post-discharge living arrangements Medical problems require only outpatient monitoring Verbal commitment to aftercare and medication compliance Withdrawal symptoms are absent or subacute and managed without 24-hour nursing intervention  PRELIMINARY DISCHARGE PLAN: Outpatient therapy  PATIENT/FAMIILY INVOLVEMENT: This treatment plan has been presented to and reviewed with the patient, Joshua Davila, and/or family member.  The patient and family have been given the opportunity to ask questions and make suggestions.  Joshua Davila 09/15/2015, 11:06 PM

## 2015-09-15 NOTE — ED Notes (Signed)
Pt woke angry that the TV in the room across the hall from him was left on stating "that tv's been on for days." there had been a pt in the room until recently. When writer asked pt what was wrong and if assistance was needed, pt reported that he was worried about his dog and that "the dr's here are trying to kill me." Pt asked writer to make calls for her. Writer asked pt if she wanted medication for anxiety and Zyprexa provided at pt request.

## 2015-09-15 NOTE — Progress Notes (Signed)
Patient ID: Charlynn GrimesJames B Balz, male   DOB: April 05, 1980, 35 y.o.   MRN: [id number] Per State regulations 482.30 this chart was reviewed for medical necessity with respect to the patient's admission/duration of stay.    Next review date: 09/19/15  Thurman CoyerEric Venna Berberich, BSN, RN-BC  Case Manager

## 2015-09-15 NOTE — Progress Notes (Signed)
Chaplain referred to the pt for a spiritual care visit. Pt is a veteran who may have PTSD. Pt admits drug abuse and reports wishing to kick the habit, but acknowledges that it is hard given that pt SO is supplier. Pt wept the entire visit time, fearing for pt dog and [facility]. Spiritually pt is conflicted over personal transitions and heavy medications. Chaplain and patient had a meaningful talk and requests further spiritual care while in the hospital. Pt still shows signs of withdrawal from meth and perhaps other drugs.  Page chaplain if pt needs or requests further spiritual or emotional care.  Benjie Karvonenharles D. Tyliah Schlereth, DMin, MDiv Chaplain

## 2015-09-16 ENCOUNTER — Encounter (HOSPITAL_COMMUNITY): Payer: Self-pay | Admitting: Psychiatry

## 2015-09-16 DIAGNOSIS — F19929 Other psychoactive substance use, unspecified with intoxication, unspecified: Secondary | ICD-10-CM

## 2015-09-16 DIAGNOSIS — F1590 Other stimulant use, unspecified, uncomplicated: Secondary | ICD-10-CM

## 2015-09-16 DIAGNOSIS — F1220 Cannabis dependence, uncomplicated: Secondary | ICD-10-CM

## 2015-09-16 DIAGNOSIS — F649 Gender identity disorder, unspecified: Secondary | ICD-10-CM | POA: Diagnosis present

## 2015-09-16 DIAGNOSIS — F1994 Other psychoactive substance use, unspecified with psychoactive substance-induced mood disorder: Secondary | ICD-10-CM | POA: Diagnosis present

## 2015-09-16 DIAGNOSIS — F1120 Opioid dependence, uncomplicated: Secondary | ICD-10-CM

## 2015-09-16 DIAGNOSIS — F19950 Other psychoactive substance use, unspecified with psychoactive substance-induced psychotic disorder with delusions: Secondary | ICD-10-CM

## 2015-09-16 DIAGNOSIS — F4310 Post-traumatic stress disorder, unspecified: Secondary | ICD-10-CM

## 2015-09-16 MED ORDER — LAMOTRIGINE 25 MG PO TABS
25.0000 mg | ORAL_TABLET | Freq: Every day | ORAL | Status: DC
  Administered 2015-09-16 – 2015-09-19 (×4): 25 mg via ORAL
  Filled 2015-09-16 (×5): qty 1

## 2015-09-16 MED ORDER — TRIAMCINOLONE ACETONIDE 0.1 % EX CREA
TOPICAL_CREAM | Freq: Two times a day (BID) | CUTANEOUS | Status: DC
  Administered 2015-09-16: 1 via TOPICAL
  Administered 2015-09-17 – 2015-09-20 (×4): via TOPICAL
  Filled 2015-09-16 (×2): qty 15

## 2015-09-16 MED ORDER — PAROXETINE HCL 20 MG PO TABS
20.0000 mg | ORAL_TABLET | Freq: Every day | ORAL | Status: DC
  Administered 2015-09-17 – 2015-09-20 (×4): 20 mg via ORAL
  Filled 2015-09-16 (×5): qty 1

## 2015-09-16 MED ORDER — MODAFINIL 200 MG PO TABS
100.0000 mg | ORAL_TABLET | Freq: Every day | ORAL | Status: DC
  Administered 2015-09-17 – 2015-09-20 (×4): 100 mg via ORAL
  Filled 2015-09-16 (×4): qty 1

## 2015-09-16 MED ORDER — QUETIAPINE FUMARATE 25 MG PO TABS
25.0000 mg | ORAL_TABLET | Freq: Every day | ORAL | Status: DC
Start: 2015-09-16 — End: 2015-09-18
  Administered 2015-09-16 – 2015-09-17 (×2): 25 mg via ORAL
  Filled 2015-09-16 (×4): qty 1

## 2015-09-16 MED ORDER — EMTRICITABINE-TENOFOVIR DF 200-300 MG PO TABS
1.0000 | ORAL_TABLET | Freq: Every day | ORAL | Status: DC
  Filled 2015-09-16 (×3): qty 1

## 2015-09-16 NOTE — Progress Notes (Signed)
Admission Notes  Pt is a 35 y/o. Pt arrived complaining about feeling very sleepy. Pt was hyper verbal, restless, concreate and disorganized.  Pt admitted to using to using crystal meth however, denied any other street drugs or alcohol; several meth induced sores and mites was observed throughout the patient's body. Pt at the time of admission denies any form of depression, anxiety, pain, SI, HI or AVH; states, "what are you talking about? Why are you asking these questions? I'm here only because of my meth addiction. Support, encouragement, and safe environment provided.  15-minute safety checks initiated and continued.

## 2015-09-16 NOTE — [facility] Group Notes (Signed)
[facility] LCS[facility] Aftercare Discharge Planning Group Note   09/16/2015 10:35 AM  Participation Quality:  Engaged  Mood/Affect:  Flat  Depression Rating:    Anxiety Rating:    Thoughts of Suicide:  No Will you contract for safety?   NA  Current AVH:  No  Plan for Discharge/Comments:  Disorganized [facility]  Neither [place] nor [facility] openings today.  Gave them my number for c/Davila when bed becomes available.  Transportation Means:   Supports:  Joshua Davila, Joshua Davila

## 2015-09-16 NOTE — [facility] Suicide Risk Assessment (Signed)
[facility]   Nursing information obtained from:  Patient Demographic factors:  Male, Adolescent or young adult, Caucasian, Cardell PeachGay, lesbian, or bisexual orientation, Low socioeconomic status, Unemployed Current Mental Status:  NA Loss Factors:  Loss of significant relationship, Decline in physical health, Legal issues, Financial problems / change in socioeconomic status Historical Factors:  NA Risk Reduction Factors:  Living with another person, especially a relative  Total Time spent with patient: 30 minutes Principal Problem: Substance-induced psychotic disorder with delusions (HCC) Diagnosis:   Patient Active Problem List   Diagnosis Date Noted  . Cannabis use disorder, moderate, dependence (HCC) [F12.20] 09/16/2015  . Opioid use disorder, moderate, dependence (HCC) [F11.20] 09/16/2015  . 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 Benzodiazepine dependence (HCC) [F13.20] 10/04/2013  . PTSD (post-traumatic stress disorder) [F43.10] 10/03/2013   Subjective Data: Please see H&P.   Continued Clinical Symptoms:  Alcohol Use Disorder Identification Test Final Score (AUDIT): 0 The "Alcohol Use Disorders Identification Test", Guidelines for Use in Primary Care, Second Edition.  World Science writerHealth Organization [facility](WHO). Score between 0-7:  no or low risk or alcohol related problems. Score between 8-15:  moderate risk of alcohol related problems. Score between 16-19:  high risk of alcohol related problems. Score 20 or above:  warrants further diagnostic evaluation for alcohol dependence and treatment.   CLINICAL FACTORS:   Alcohol/Substance Abuse/Dependencies Previous Psychiatric Diagnoses and Treatments   Musculoskeletal: Strength & Muscle Tone: within  normal limits Gait & Station: normal Patient leans: N/A  Psychiatric Specialty Exam: Physical Exam  Nursing note and vitals reviewed. Constitutional:  I concur with PE done in ED.    Review of Systems  Psychiatric/Behavioral: Positive for depression and substance abuse. The patient is nervous/anxious and has insomnia.   All other systems reviewed and are negative.   Blood pressure 121/73, pulse 106, temperature 98.2 F (36.8 C), temperature source Oral, resp. rate 20, height 5\' 9"  (1.753 m), weight 77.565 kg (171 lb), SpO2 99 %.Body mass index is 25.24 kg/(m^2).   Patient seen and chart reviewed.Discussed patient with treatment team.     COGNITIVE FEATURES THAT CONTRIBUTE TO RISK:  Closed-mindedness, Polarized thinking and Thought constriction (tunnel vision)    SUICIDE RISK:   Moderate:  Frequent suicidal ideation with limited intensity, and duration, some specificity in terms of plans, no associated intent, good self-control, limited dysphoria/symptomatology, some risk factors present, and identifiable protective factors, including available and accessible social support.  PLAN OF CARE: Please see H&P.   I certify that inpatient services furnished can reasonably be expected to improve the patient's condition.   Katricia Prehn, MD 09/16/2015, 1:45 PM
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
## 2015-09-16 NOTE — Plan of Care (Signed)
Problem: Role Relationship: Goal: Ability to communicate needs accurately will improve Outcome: Not Progressing Patient is hyper-verbal, tangential, and experiencing flight ideas at this point. This will need to be revisited and reevaluated in a couple of days.

## 2015-09-16 NOTE — Progress Notes (Signed)
Patient ID: Charlynn GrimesJames B Currington, male   DOB: 01/24/1981, 35 y.o.   MRN: [id number]  DAR: Pt. Denies SI/HI and A/V Hallucinations. Patient reports sleep is poor, appetite is good, energy level is normal, and concentration is good. Patient rates depression on a 0-10 scale, 2 x 2. He rates hopelessness, on this same scale, -7.2. Patient does not report any pain or discomfort at this time. Patient reports lesions on his body from meth. Writer assessed that he has sores on his body, MD Eappen notified. Patient reports he would like [facility] to , "network [facility] He is disorganized and hyper-verbal at this time. Speech is pressured and intrusive. He is able to be redirected with some effort. He reports some withdrawal symptoms including agitation, irritability, chilling, nausea, and diarrhea. Support and encouragement provided to the patient. Q15 minute checks are maintained for safety.

## 2015-09-16 NOTE — Progress Notes (Signed)
Patient did not attend group.

## 2015-09-16 NOTE — [facility] Group Notes (Signed)
[facility] LCSW Group Therapy  09/16/2015 1:15 pm  Type of Therapy: Process Group Therapy  Participation Level:  Active  Participation Quality:  Appropriate  Affect:  Flat  Cognitive:  Oriented  Insight:  Improving  Engagement in Group:  Limited  Engagement in Therapy:  Limited  Modes of Intervention:  Activity, Clarification, Education, Problem-solving and Support  Summary of Progress/Problems: Today's group addressed the issue of overcoming obstacles.  Patients were asked to identify their biggest obstacle post d/c that stands in the way of their on-going success, and then problem solve as to how to manage this. Invited.  Chose to not attend. Ida Rogueorth, Nastasha Reising B 09/16/2015   3:28 PM
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
## 2015-09-17 LAB — HIV ANTIBODY (ROUTINE TESTING W REFLEX): HIV Screen 4th Generation wRfx: NONREACTIVE

## 2015-09-17 LAB — LIPID PANEL
CHOL/HDL RATIO: 7.1 ratio
Cholesterol: 191 mg/dL (ref 0–200)
HDL: 27 mg/dL — AB (ref >40)
LDL CALC: 125 mg/dL — AB (ref 0–99)
Triglycerides: 196 mg/dL — ABNORMAL HIGH (ref <150)
VLDL: 39 mg/dL (ref 0–40)

## 2015-09-17 LAB — TSH: TSH: 1.466 u[IU]/mL (ref 0.350–4.500)

## 2015-09-17 MED ORDER — FINASTERIDE 5 MG PO TABS
10.0000 mg | ORAL_TABLET | Freq: Every day | ORAL | Status: DC
  Administered 2015-09-17 – 2015-09-20 (×4): 10 mg via ORAL
  Filled 2015-09-17 (×5): qty 2

## 2015-09-17 MED ORDER — TRIHEXYPHENIDYL HCL 2 MG PO TABS
2.0000 mg | ORAL_TABLET | Freq: Two times a day (BID) | ORAL | Status: DC
  Administered 2015-09-17 – 2015-09-20 (×6): 2 mg via ORAL
  Filled 2015-09-17 (×8): qty 1

## 2015-09-17 MED ORDER — ESTRADIOL 0.05 MG/24HR TD PTWK
0.0500 mg | MEDICATED_PATCH | TRANSDERMAL | Status: DC
  Administered 2015-09-17: 0.05 mg via TRANSDERMAL
  Filled 2015-09-17: qty 1

## 2015-09-17 MED ORDER — ESTRADIOL 0.05 MG/24HR TD PTTW: 0.0500 mg | MEDICATED_PATCH | Freq: Every day | TRANSDERMAL | Status: DC

## 2015-09-17 NOTE — [facility] Group Notes (Signed)

## 2015-09-17 NOTE — Progress Notes (Signed)
The focus of this group is to help patients review their daily goal of treatment and discuss progress on daily workbooks.  Patient did not attend group today.

## 2015-09-17 NOTE — Progress Notes (Signed)
Recreation Therapy Notes   Animal-Assisted Activity (AAA) Program Checklist/Progress Notes Patient Eligibility Criteria Checklist & Daily Group note for Rec Tx Intervention  Date: 06.27.2017 Time: 2:45pm [facility] [address]    AAA/T Program Assumption of Risk Form signed by Patient/ or Parent Legal Guardian NO  Behavioral Response: Did not attend. Patient discussed with MD for appropriateness in pet therapy session. Both LRT and MD agree patient is appropriate for participation. Patient on phone when LRT attempted to et consent form signed. Patient speaking to his mother, whom he is upset with for calling hospital to check on him. LRT waited for approximately 5 minutes for patient to finish phone conversation. Patient was still on the phone when session started, due to not being able to get consent form sign patient not offered participation in session.    Marykay Lexenise L Lavert Matousek, LRT/CTRS        [place] L 09/17/2015 3:09 PM
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
## 2015-09-17 NOTE — Plan of Care (Signed)
Problem: Education: Goal: Knowledge of Joshua Davila General Education information/materials will improve Outcome: Progressing Nurse discussed depression/coping skills with patient.

## 2015-09-17 NOTE — Progress Notes (Signed)
[facility] MD Progress Note  09/17/2015 11:44 AM Charlynn GrimesJames B Doxtater  MRN:  [id number] Subjective:  Patient states " I am drooling , I am not sure what is causing it.'  Objective:Xzayvion is a 35 year old Caucasian male, who is on SSD , has a hx of PTSD, gender dysphoria as well as polysubstance abuse , presented IVCed for disorganized behavior and suicidal thoughts.  Patient seen and chart reviewed.Discussed patient with treatment team.  Pt today seen as pressured , tangential as well as delusional. Pt continues to be paranoid and feel like his SO is out to get him and trying to poison him.However , he is planning on going [place] house [place]. [facility]. CSW is working on it. Pt complaints of drooling - will offer artane for the same, he states he does not want cogentin. Will continue to need support and encouragement.     Principal Problem: Substance-induced psychotic disorder with delusions (HCC) ( Cannabis , stimulant, hx of BZD,Opioids)  Diagnosis:   Patient Active Problem List   Diagnosis Date Noted  . Cannabis use disorder, moderate, dependence (HCC) [F12.20] 09/16/2015  . Opioid use disorder, moderate, dependence (HCC) [F11.20] 09/16/2015  . 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 Benzodiazepine dependence (HCC) [F13.20] 10/04/2013  . PTSD (post-traumatic stress disorder) [F43.10] 10/03/2013   Total Time spent with patient: 30 minutes  Past Psychiatric History: Please see H&P.   Past Medical History:  Past Medical History  Diagnosis Date  . Schizoaffective disorder (HCC)   . PTSD (post-traumatic stress disorder)   . Depression   . Liver disorder   . Gender identity  disorder    History reviewed. No pertinent past surgical history. Family History:  Family History  Problem Relation Age of Onset  . Multiple sclerosis Mother   . Mental illness Neg Hx    Family Psychiatric  History:Please see H&P.  Social History:  History  Alcohol Use  . Yes    Comment: occasional     History  Drug Use  . Yes  . Special: Cocaine, Methamphetamines, Marijuana    Comment: last useof methamphetamines and marijuana yesterday    Social History   Social History  . Marital Status: Single    Spouse Name: N/A  . Number of Children: N/A  . Years of Education: N/A   Social History Main Topics  . Smoking status: Current Every Day Smoker -- 1.00 packs/day    Types: Cigarettes  . Smokeless tobacco: Never Used  . Alcohol Use: Yes     Comment: occasional  . Drug Use: Yes    Special: Cocaine, Methamphetamines, Marijuana     Comment: last useof methamphetamines and marijuana yesterday  . Sexual Activity: Not Currently   Other Topics Concern  . None   Social History Narrative   Additional Social History:    Pain Medications: Pt denies Prescriptions: Polysubstance abuse "Pt could not report" Over the Counter: Pt denies History of alcohol / drug use?: Yes Longest period of sobriety (when/how long): unknown Negative Consequences of Use: Financial, Legal, Personal relationships Withdrawal Symptoms: Seizures, Irritability, Sweats, Tremors Onset of Seizures: Four years ago Date of most recent seizure: Four years ago Name of Substance 1: Marijuana 1 - Age of First  Use: unknown 1 - Amount (size/oz): unknown 1 - Frequency: daily 1 - Duration: ongoing 1 - Last Use / Amount: unknown                  Sleep: Fair  Appetite:  Fair  Current Medications: Current Facility-Administered Medications  Medication Dose Route Frequency Provider Last Rate Last Dose  . acetaminophen (TYLENOL) tablet 650 mg  650 mg Oral Q4H PRN Earney NavyJosephine C Onuoha, NP      . alum &  mag hydroxide-simeth (MAALOX/MYLANTA) 200-200-20 MG/5ML suspension 30 mL  30 mL Oral PRN Earney NavyJosephine C Onuoha, NP      . estradiol (VIVELLE-DOT) 0.05 MG/24HR patch 0.05 mg  0.05 mg Transdermal Daily Italo Banton, MD      . finasteride (PROSCAR) tablet 10 mg  10 mg Oral Daily Geoffry Bannister, MD      . ibuprofen (ADVIL,MOTRIN) tablet 600 mg  600 mg Oral Q8H PRN Earney NavyJosephine C Onuoha, NP      . lamoTRIgine (LAMICTAL) tablet 25 mg  25 mg Oral Daily Jomarie LongsSaramma Taylor Levick, MD   25 mg at 09/17/15 0810  . LORazepam (ATIVAN) tablet 1 mg  1 mg Oral Q8H PRN Earney NavyJosephine C Onuoha, NP   1 mg at 09/17/15 0824  . modafinil (PROVIGIL) tablet 100 mg  100 mg Oral Daily Jomarie LongsSaramma Jahshua Bonito, MD   100 mg at 09/17/15 0809  . nicotine polacrilex (NICORETTE) gum 2 mg  2 mg Oral PRN Jomarie LongsSaramma Jyair Kiraly, MD   2 mg at 09/16/15 1545  . ondansetron (ZOFRAN) tablet 4 mg  4 mg Oral Q8H PRN Earney NavyJosephine C Onuoha, NP      . PARoxetine (PAXIL) tablet 20 mg  20 mg Oral Daily Jomarie LongsSaramma Ingrid Shifrin, MD   20 mg at 09/17/15 0810  . QUEtiapine (SEROQUEL) tablet 25 mg  25 mg Oral QHS Jomarie LongsSaramma Deannie Resetar, MD   25 mg at 09/16/15 2137  . triamcinolone cream (KENALOG) 0.1 %   Topical BID Sanjuana KavaAgnes I Nwoko, NP      . trihexyphenidyl (ARTANE) tablet 2 mg  2 mg Oral BID WC Jomarie LongsSaramma Carmita Boom, MD        Lab Results:  Results for orders placed or performed during the hospital encounter of 09/15/15 (from the past 48 hour(s))  TSH     Status: None   Collection Time: 09/17/15  6:29 AM  Result Value Ref Range   TSH 1.466 0.350 - 4.500 uIU/mL    Comment: [facility]  Lipid panel     Status: Abnormal   Collection Time: 09/17/15  6:29 AM  Result Value Ref Range   Cholesterol 191 0 - 200 mg/dL   Triglycerides 045196 (H) <150 mg/dL   HDL 27 (L) >40>40 mg/dL   Total CHOL/HDL Ratio 7.1 RATIO   VLDL 39 0 - 40 mg/dL   LDL Cholesterol 981125 (H) 0 - 99 mg/dL    Comment:        Total Cholesterol/HDL:CHD Risk Coronary Heart Disease Risk Table                     Men    Women  1/2 Average Risk   3.4   3.3  Average Risk       5.0   4.4  2 X Average Risk   9.6   7.1  3 X Average Risk  23.4   11.0        Use the calculated Patient Ratio above and the CHD Risk  Table to determine the patient's CHD Risk.        ATP III CLASSIFICATION (LDL):  <100     mg/dL   Optimal  161-096  mg/dL   Near or Above                    Optimal  130-159  mg/dL   Borderline  045-409  mg/dL   High  >811     mg/dL   Very High Performed at [facility]     Blood Alcohol level:  Lab Results  Component Value Date   [facility] <5 09/12/2015   ETH <5 06/19/2015    Metabolic Disorder Labs: No results found for: HGBA1C, MPG No results found for: PROLACTIN Lab Results  Component Value Date   CHOL 191 09/17/2015   TRIG 196* 09/17/2015   HDL 27* 09/17/2015   CHOLHDL 7.1 09/17/2015   VLDL 39 09/17/2015   LDLCALC 125* 09/17/2015    Physical Findings: AIMS: Facial and Oral Movements Muscles of Facial Expression: None, normal Lips and Perioral Area: None, normal Jaw: None, normal Tongue: None, normal,Extremity Movements Upper (arms, wrists, hands, fingers): None, normal Lower (legs, knees, ankles, toes): None, normal, Trunk Movements Neck, shoulders, hips: None, normal, Overall Severity Severity of abnormal movements (highest score from questions above): None, normal Incapacitation due to abnormal movements: None, normal Patient's awareness of abnormal movements (rate only patient's report): No Awareness, Dental Status Current problems with teeth and/or dentures?: No Does patient usually wear dentures?: No  CIWA:  CIWA-Ar Total: 5 COWS:  COWS Total Score: 10  Musculoskeletal: Strength & Muscle Tone: within normal limits Gait & Station: normal Patient leans: N/A  Psychiatric Specialty Exam: Physical Exam  Nursing note and vitals reviewed. Constitutional:  I concur with PE done in ED.    Review of Systems  Psychiatric/Behavioral: Positive for depression,  hallucinations and substance abuse. The patient is nervous/anxious.   All other systems reviewed and are negative.   Blood pressure 112/65, pulse 93, temperature 97.8 F (36.6 C), temperature source Oral, resp. rate 16, height  (1.753 m), weight 77.565 kg (171 lb), SpO2 99 %.Body mass index is 25.24 kg/(m^2).  General Appearance: Guarded  Eye Contact:  Minimal  Speech:  Pressured  Volume:  Increased  Mood:  Anxious, Hopeless and Irritable  Affect:  Labile  Thought Process:  Irrelevant and Descriptions of Associations: Tangential  Orientation:  Full (Time, Place, and Person)  Thought Content:  Delusions, Hallucinations: Auditory, Paranoid Ideation, Rumination and Tangential  Suicidal Thoughts:  denies , but is paranoid and delusional making him a danger to self or others.  Homicidal Thoughts:  No  Memory:  Immediate;   Fair Recent;   Fair Remote;   Fair  Judgement:  Impaired  Insight:  Shallow  Psychomotor Activity:  Restlessness  Concentration:  Concentration: Fair and Attention Span: Fair  Recall:  [employer] of Knowledge:  Fair  Language:  Fair  Akathisia:  No  Handed:  Right  AIMS (if indicated):     Assets:  Desire for Improvement  ADL's:  Intact  Cognition:  WNL  Sleep:  Number of Hours: 6.75     Treatment Plan Summary:Dakhari is a 35 year old Caucasian male, who is on SSD , has a hx of PTSD, gender dysphoria as well as polysubstance abuse , presented IVCed for disorganized behavior and suicidal thoughts. Pt continues to be psychotic and anxious, will continue treatment.  Daily contact with patient to assess  and evaluate symptoms and progress in treatment and Medication management Restarted Paxil 20 mg po daily for PTSD sx. Will continue Seroquel 25 mg po qhs for sleep/psychosis/mood sx. Will continue Lamictal 25 mg po daily for mood sx. Will add Artane 2 mg po bid for SE of seroquel. Restarted home medications where indicated, including Provigil - previous dose -  see MAR. Will continue to monitor vitals ,medication compliance and treatment side effects while patient is here.  Will monitor for medical issues as well as call consult as needed.  Reviewed labs - Abnormal bilirubin- pending acute hepatitis panel ,HIV ab . TSH- wnl , lipid panel- abnormal - dietician consult , pending hba1c. CSW will continue working on disposition. Referral to [facility] for continuity of care. Recreational therapy consult. Patient to participate in therapeutic milieu .   Mattalyn Anderegg, MD 09/17/2015, 11:44 AM

## 2015-09-17 NOTE — Progress Notes (Signed)
Nutrition Note  Consult for hyperlipidemia education received. Per protocol, RN to provide pt with copy of packet outlining general healthy eating in addition to hyperlipidemia-specific information.   No further nutrition intervention warranted at this time. Please re-consult if further nutrition-related issues arise.  Aleksa Catterton, MS, RD, LDN Pager: [phone] After Hours Pager: [phone]   

## 2015-09-17 NOTE — Progress Notes (Signed)
  D: Pt was laying in bed prior to the assessment. Writer found it difficult to follow the writer in some of the conversation due to his fast talking and soft tone. However, stated he went to all except 2 groups today including the hs.  Pt has no questions or concerns.   A:  Support and encouragement was offered. 15 min checks continued for safety.  R: Pt remains safe.
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
## 2015-09-17 NOTE — Progress Notes (Signed)
Approximately 1730 patient was in his room, naked, moving beds around the room, etc.  Patient dressed.  Staff came to his room, room was swept.  Encouraged patient to come to dayroom and eat his dinner.  Patient given ativan and has been sitting in dayroom eating dinner and drinking gatorade.  Patient has been calm and talked to nurse about buddies in the service and war experiences.  Respirations even and unlabored.  No signs/symptoms of pain/distress noted on patient's face/body movements.  Safety maintained with 15 minute checks.

## 2015-09-17 NOTE — [facility] Group Notes (Signed)
[facility] LCSW Group Therapy  09/17/2015 1:52 PM   Type of Therapy:  Group Therapy  Participation Level:  Active  Participation Quality:  Attentive  Affect:  Appropriate  Cognitive:  Appropriate  Insight:  Improving  Engagement in Therapy:  Engaged  Modes of Intervention:  Clarification, Education, Exploration and Socialization  Summary of Progress/Problems: Today's group focused on resilience.  Invited.  Chose to not attend. Daryel Geraldorth, Donne Robillard B 09/17/2015 , 1:52 PM

## 2015-09-18 LAB — HEMOGLOBIN A1C
Hgb A1c MFr Bld: 5.1 % (ref 4.8–5.6)
Mean Plasma Glucose: 100 mg/dL

## 2015-09-18 LAB — HEPATITIS PANEL, ACUTE
HCV Ab: <0.1 s/co ratio (ref 0.0–0.9)
HEP B C IGM: NEGATIVE
HEP B S AG: NEGATIVE
Hep A IgM: NEGATIVE

## 2015-09-18 MED ORDER — ZIPRASIDONE MESYLATE 20 MG IM SOLR
20.0000 mg | Freq: Once | INTRAMUSCULAR | Status: DC
Start: 2015-09-18 — End: 2015-09-20
  Filled 2015-09-18 (×2): qty 20

## 2015-09-18 MED ORDER — DIPHENHYDRAMINE HCL 50 MG PO CAPS
50.0000 mg | ORAL_CAPSULE | Freq: Once | ORAL | Status: AC
  Administered 2015-09-18: 50 mg via ORAL
  Filled 2015-09-18: qty 2
  Filled 2015-09-18: qty 1

## 2015-09-18 MED ORDER — LORAZEPAM 1 MG PO TABS
2.0000 mg | ORAL_TABLET | Freq: Once | ORAL | Status: AC
  Administered 2015-09-18: 2 mg via ORAL
  Filled 2015-09-18: qty 2

## 2015-09-18 MED ORDER — ATORVASTATIN CALCIUM 40 MG PO TABS
40.0000 mg | ORAL_TABLET | Freq: Every day | ORAL | Status: DC
  Administered 2015-09-18 – 2015-09-19 (×2): 40 mg via ORAL
  Filled 2015-09-18 (×3): qty 1

## 2015-09-18 MED ORDER — LORAZEPAM 2 MG/ML IJ SOLN
2.0000 mg | Freq: Once | INTRAMUSCULAR | Status: DC
  Filled 2015-09-18: qty 1

## 2015-09-18 MED ORDER — QUETIAPINE FUMARATE 50 MG PO TABS
50.0000 mg | ORAL_TABLET | Freq: Every day | ORAL | Status: DC
  Administered 2015-09-18: 50 mg via ORAL
  Filled 2015-09-18 (×3): qty 1

## 2015-09-18 MED ORDER — ZIPRASIDONE HCL 20 MG PO CAPS
20.0000 mg | ORAL_CAPSULE | Freq: Once | ORAL | Status: AC
  Administered 2015-09-18: 20 mg via ORAL
  Filled 2015-09-18 (×2): qty 1

## 2015-09-18 MED ORDER — DIPHENHYDRAMINE HCL 50 MG/ML IJ SOLN
50.0000 mg | Freq: Once | INTRAMUSCULAR | Status: DC
  Filled 2015-09-18 (×2): qty 1

## 2015-09-18 NOTE — Progress Notes (Signed)
Patient is extremely agitated. Observed yelling down hall from outside his room door. "COME LOOK AT THIS FILTH"!!! RN was then told by MHT to come look at patient's room. Upon walking to room this writer observed the trash can turned over in the door opening, 2 mattresses laid out in the middle of the floor, 1 box bed frame had been lifted off the floor and placed upright against the wall as if a murphy bed and the other box bed frame had been turned over in the floor. The patient has also taken all the air filters out of the air conditioner and placed them in the middle of the floor as well. He has spilled coffee all over the floor and was noted to be cleaning it with a pillow case cover. He has also pulled the shower curtain away from the shower stall and told this writer "GO GET YOUR WHITE GLOVES ON AND COME BACK IN HERE AND WIPE ALL THIS FILTH DOWN". He is very loud and angry at this time. On call provider notified. New orders given. Will continue to monitor.
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
## 2015-09-18 NOTE — [facility] Group Notes (Signed)
[facility] Group Therapy  09/18/2015 , 1:58 PM    Type of Therapy:  [employer] Presentation  Participation Level:  Active  Participation Quality:  Attentive  Affect:  Blunted  Cognitive:  Oriented  Insight:  Limited  Engagement in Therapy:  Engaged  Modes of Intervention:  Discussion, Education and Socialization  Summary of Progress/Problems:  Joshua Davila from [employer] came to present his recovery story and play the guitar.  Did not attend  Joshua Davila, Joshua Davila 09/18/2015 , 1:58 PM

## 2015-09-18 NOTE — Progress Notes (Signed)
Initial 1:1 Note:   [name] who goes by "Joshua Davila" has been medicated for agitation, hallucination and anxiety. At this time he is still tangential with disorganized thought processing. He is calmer however still with labile mood and visual hallucination. A 1:1 sitter has been implemented at this time for the patient's safety. Will continue to monitor for medication effectiveness and patient's personal safety.
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
## 2015-09-18 NOTE — Progress Notes (Signed)
D: Patient sitting in day room banging on the piano.  Asked the patient if he could refrain from pounding on the keys.  He states, "then damnit, give me a shot.  I'm agitated."  Patient was given a dose of ativan po with little results.  He is currently in day room crying and praying.  His speech is disorganized with labile mood.  He is pleasant at times, then turns angry and obnoxious.  Patient became angry when staff would not answer the phone for him and tell him who was on the phone.  He denies SI/HI/AVH. A: Continue to monitor medication management and MD orders.  Safety checks continued every 15 minutes per protocol.  Offer support and encouragement as needed. R: Patient remains disorganized and labile.

## 2015-09-18 NOTE — Progress Notes (Signed)
[facility]  09/18/2015 1:03 PM Joshua GrimesJames B Malmstrom  MRN:  [id number] Subjective:  Patient states " I am still paranoid ."   Objective:Joshua Davila is a 35 year old Caucasian male, who is on SSD , has a hx of PTSD, gender dysphoria as well as polysubstance abuse , presented IVCed for disorganized behavior and suicidal thoughts.  Patient seen and chart reviewed.Discussed patient with treatment team.  Pt today seen as pressured , tangential as well as delusional. Pt continues to have paranoid ideation that he may not be safe , especially towards his SO. Pt had an episode this AM when he was agitated , provoked by a peer. Pt continues to need medications readjustment.      Principal Problem: Substance-induced psychotic disorder with delusions (HCC) ( Cannabis , stimulant, hx of BZD,Opioids)  Diagnosis:   Patient Active Problem List   Diagnosis Date Noted  . Cannabis use disorder, moderate, dependence (HCC) [F12.20] 09/16/2015  . Opioid use disorder, moderate, dependence (HCC) [F11.20] 09/16/2015  . 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 Benzodiazepine dependence (HCC) [F13.20] 10/04/2013  . PTSD (post-traumatic stress disorder) [F43.10] 10/03/2013   Total Time spent with patient: 30 minutes  Past Psychiatric History: Please see H&P.   Past Medical History:  Past Medical History  Diagnosis Date  . Schizoaffective disorder (HCC)   . PTSD (post-traumatic stress disorder)   . Depression   . Liver disorder   . Gender identity disorder    History reviewed. No pertinent past surgical history. Family History:  Family History  Problem Relation Age of Onset  . Multiple sclerosis Mother   . Mental illness Neg Hx    Family  Psychiatric  History:Please see H&P.  Social History:  History  Alcohol Use  . Yes    Comment: occasional     History  Drug Use  . Yes  . Special: Cocaine, Methamphetamines, Marijuana    Comment: last useof methamphetamines and marijuana yesterday    Social History   Social History  . Marital Status: Single    Spouse Name: N/A  . Number of Children: N/A  . Years of Education: N/A   Social History Main Topics  . Smoking status: Current Every Day Smoker -- 1.00 packs/day    Types: Cigarettes  . Smokeless tobacco: Never Used  . Alcohol Use: Yes     Comment: occasional  . Drug Use: Yes    Special: Cocaine, Methamphetamines, Marijuana     Comment: last useof methamphetamines and marijuana yesterday  . Sexual Activity: Not Currently   Other Topics Concern  . None   Social History Narrative   Additional Social History:    Pain Medications: Pt denies Prescriptions: Polysubstance abuse "Pt could not report" Over the Counter: Pt denies History of alcohol / drug use?: Yes Longest period of sobriety (when/how long): unknown Negative Consequences of Use: Financial, Legal, Personal relationships Withdrawal Symptoms: Seizures, Irritability, Sweats, Tremors Onset of Seizures: Four years ago Date of most recent seizure: Four years ago Name of Substance 1: Marijuana 1 - Age of First Use: unknown 1 - Amount (size/oz): unknown 1 - Frequency: daily 1 - Duration: ongoing 1 - Last Use / Amount: unknown                  Sleep: Fair  Appetite:  Fair  Current Medications: Current Facility-Administered Medications  Medication Dose Route Frequency Provider Last Rate Last Dose  . acetaminophen (TYLENOL) tablet 650 mg  650 mg Oral Q4H PRN Earney NavyJosephine C Onuoha, NP      . alum & mag hydroxide-simeth (MAALOX/MYLANTA) 200-200-20 MG/5ML suspension 30 mL  30 mL Oral PRN Earney NavyJosephine C Onuoha, NP      . atorvastatin (LIPITOR) tablet 40 mg  40 mg Oral q1800 Jomarie LongsSaramma Eliyas Suddreth, MD      .  estradiol (CLIMARA - Dosed in mg/24 hr) patch 0.05 mg  0.05 mg Transdermal Weekly Kaisy Severino, MD   0.05 mg at 09/17/15 1305  . finasteride (PROSCAR) tablet 10 mg  10 mg Oral Daily Jomarie LongsSaramma Phoenyx Melka, MD   10 mg at 09/18/15 [id number]  . ibuprofen (ADVIL,MOTRIN) tablet 600 mg  600 mg Oral Q8H PRN Earney NavyJosephine C Onuoha, NP   600 mg at 09/17/15 1704  . lamoTRIgine (LAMICTAL) tablet 25 mg  25 mg Oral Daily Jomarie LongsSaramma Jentzen Minasyan, MD   25 mg at 09/18/15 0821  . LORazepam (ATIVAN) tablet 1 mg  1 mg Oral Q8H PRN Earney NavyJosephine C Onuoha, NP   1 mg at 09/17/15 1823  . modafinil (PROVIGIL) tablet 100 mg  100 mg Oral Daily Jomarie LongsSaramma Alveria Mcglaughlin, MD   100 mg at 09/18/15 0821  . nicotine polacrilex (NICORETTE) gum 2 mg  2 mg Oral PRN Jomarie LongsSaramma Kiaraliz Rafuse, MD   2 mg at 09/17/15 1212  . ondansetron (ZOFRAN) tablet 4 mg  4 mg Oral Q8H PRN Earney NavyJosephine C Onuoha, NP   4 mg at 09/17/15 1348  . PARoxetine (PAXIL) tablet 20 mg  20 mg Oral Daily Jomarie LongsSaramma Rino Hosea, MD   20 mg at 09/18/15 [id number]  . QUEtiapine (SEROQUEL) tablet 50 mg  50 mg Oral QHS Reyana Leisey, MD      . triamcinolone cream (KENALOG) 0.1 %   Topical BID Sanjuana KavaAgnes I Nwoko, NP      . trihexyphenidyl (ARTANE) tablet 2 mg  2 mg Oral BID WC Jomarie LongsSaramma Derra Shartzer, MD   2 mg at 09/18/15 [id number]    Lab Results:  Results for orders placed or performed during the hospital encounter of 09/15/15 (from the past 48 hour(s))  HIV antibody     Status: None   Collection Time: 09/16/15  6:16 PM  Result Value Ref Range   HIV Screen 4th Generation wRfx Non Reactive Non Reactive    Comment: (NOTE) Performed At: [facility] [address] [place], [place] [id number] Mila HomerHancock William F MD [id number] [facility]   Hepatitis panel, acute     Status: None   Collection Time: 09/17/15  6:29 AM  Result Value Ref Range   Hepatitis B Surface Ag Negative Negative   HCV Ab <0.1 0.0 - 0.9 s/co ratio    Comment: (NOTE)                                  Negative:     < 0.8                              Indeterminate: 0.8 - 0.9                                  Positive:     > 0.9 The  CDC recommends that a positive HCV antibody result be followed up with a HCV Nucleic Acid Amplification test (161096). Performed At: [facility] [address] [place], [place] [id number] Mila Homer MD [id number]    Hep A IgM Negative Negative   Hep B C IgM Negative Negative    Comment: Performed at [facility]  TSH     Status: None   Collection Time: 09/17/15  6:29 AM  Result Value Ref Range   TSH 1.466 0.350 - 4.500 uIU/mL    Comment: Performed at [facility]  Lipid panel     Status: Abnormal   Collection Time: 09/17/15  6:29 AM  Result Value Ref Range   Cholesterol 191 0 - 200 mg/dL   Triglycerides 086 (H) <150 mg/dL   HDL 27 (L) >57 mg/dL   Total CHOL/HDL Ratio 7.1 RATIO   VLDL 39 0 - 40 mg/dL   LDL Cholesterol 846 (H) 0 - 99 mg/dL    Comment:        Total Cholesterol/HDL:CHD Risk Coronary Heart Disease Risk Table                     Men   Women  1/2 Average Risk   3.4   3.3  Average Risk       5.0   4.4  2 X Average Risk   9.6   7.1  3 X Average Risk  23.4   11.0        Use the calculated Patient Ratio above and the CHD Risk Table to determine the patient's CHD Risk.        ATP III CLASSIFICATION (LDL):  <100     mg/dL   Optimal  962-952  mg/dL   Near or Above                    Optimal  130-159  mg/dL   Borderline  841-324  mg/dL   High  >401     mg/dL   Very High Performed at [facility]   Hemoglobin A1c     Status: None   Collection Time: 09/17/15  6:29 AM  Result Value Ref Range   Hgb A1c MFr Bld 5.1 4.8 - 5.6 %    Comment: (NOTE)         Pre-diabetes: 5.7 - 6.4         Diabetes: >6.4         Glycemic control for adults with diabetes: <7.0    Mean Plasma Glucose 100 mg/dL    Comment: (NOTE) Performed At: [facility] [address] [place], [place] [id number] Mila Homer MD  QI:[id number] Performed at [facility]     Blood Alcohol level:  Lab Results  Component Value Date   [facility] <5 09/12/2015   ETH <5 06/19/2015    Metabolic Disorder Labs: Lab Results  Component Value Date   HGBA1C 5.1 09/17/2015   MPG 100 09/17/2015   No results found for: PROLACTIN Lab Results  Component Value Date   CHOL 191 09/17/2015   TRIG 196* 09/17/2015   HDL 27* 09/17/2015   CHOLHDL 7.1 09/17/2015   VLDL 39 09/17/2015   LDLCALC 125* 09/17/2015    Physical Findings: AIMS: Facial and Oral Movements Muscles of Facial Expression: None, normal Lips and Perioral Area: None, normal Jaw: None, normal Tongue: None, normal,Extremity Movements Upper (arms, wrists, hands, fingers): None, normal Lower (legs, knees,  ankles, toes): None, normal, Trunk Movements Neck, shoulders, hips: None, normal, Overall Severity Severity of abnormal movements (highest score from questions above): None, normal Incapacitation due to abnormal movements: None, normal Patient's awareness of abnormal movements (rate only patient's report): No Awareness, Dental Status Current problems with teeth and/or dentures?: No Does patient usually wear dentures?: No  CIWA:  CIWA-Ar Total: 6 COWS:  COWS Total Score: 2  Musculoskeletal: Strength & Muscle Tone: within normal limits Gait & Station: normal Patient leans: N/A  Psychiatric Specialty Exam: Physical Exam  Nursing note and vitals reviewed. Constitutional:  I concur with PE done in ED.    Review of Systems  Psychiatric/Behavioral: Positive for depression, hallucinations and substance abuse. The patient is nervous/anxious.   All other systems reviewed and are negative.   Blood pressure 134/71, pulse 77, temperature 98 F (36.7 C), temperature source Oral, resp. rate 18, height 5\' 9"  (1.753 m), weight 77.565 kg (171 lb), SpO2 100 %.Body mass index is 25.24 kg/(m^2).  General Appearance: Guarded  Eye Contact:  Minimal   Speech:  Pressured  Volume:  Increased  Mood:  Anxious, Hopeless and Irritable  Affect:  Labile  Thought Process:  Irrelevant and Descriptions of Associations: Tangential  Orientation:  Full (Time, Place, and Person)  Thought Content:  Delusions, Hallucinations: Auditory, Paranoid Ideation, Rumination and Tangential  Suicidal Thoughts:  denies , but is paranoid and delusional making him a danger to self or others.  Homicidal Thoughts:  No  Memory:  Immediate;   Fair Recent;   Fair Remote;   Fair  Judgement:  Impaired  Insight:  Shallow  Psychomotor Activity:  Restlessness  Concentration:  Concentration: Fair and Attention Span: Fair  Recall:  [employer] of Knowledge:  Fair  Language:  Fair  Akathisia:  No  Handed:  Right  AIMS (if indicated):     Assets:  Desire for Improvement  ADL's:  Intact  Cognition:  WNL  Sleep:  Number of Hours: 5.75     Treatment Plan Summary:Joshua Davila is a 35 year old Caucasian male, who is on SSD , has a hx of PTSD, gender dysphoria as well as polysubstance abuse , presented IVCed for disorganized behavior and suicidal thoughts. Pt continues to be psychotic and anxious, will continue treatment.  Daily contact with patient to assess and evaluate symptoms and progress in treatment and Medication management Restarted Paxil 20 mg po daily for PTSD sx. Will increase Seroquel to 50 mg po qhs for sleep/psychosis/mood sx. Will continue Lamictal 25 mg po daily for mood sx. Will continue Artane 2 mg po bid for SE of seroquel. Restarted home medications where indicated, including Provigil - previous dose - see MAR. Will continue to monitor vitals ,medication compliance and treatment side effects while patient is here.  Will monitor for medical issues as well as call consult as needed.  Reviewed labs -acute hepatitis panel - NR ,HIV ab- NR  . TSH- wnl , lipid panel- abnormal - dietician consult , hba1c- WNL . CSW will continue working on disposition. Referral to  [facility] for continuity of care. Recreational therapy consult. Patient to participate in therapeutic milieu .   Mallary Kreger, MD 09/18/2015, 1:03 PM

## 2015-09-18 NOTE — Progress Notes (Signed)
Pt did not attend NA group this evening.  

## 2015-09-19 MED ORDER — FINASTERIDE 5 MG PO TABS: 10.0000 mg | ORAL_TABLET | Freq: Every day | ORAL | Status: DC

## 2015-09-19 MED ORDER — PAROXETINE HCL 20 MG PO TABS: 20.0000 mg | ORAL_TABLET | Freq: Every day | ORAL | Status: DC

## 2015-09-19 MED ORDER — ONDANSETRON HCL 4 MG PO TABS: 4.0000 mg | ORAL_TABLET | Freq: Three times a day (TID) | ORAL | Status: DC | PRN

## 2015-09-19 MED ORDER — EMTRICITABINE-TENOFOVIR DF 200-300 MG PO TABS: 1.0000 | ORAL_TABLET | Freq: Every day | ORAL | Status: DC

## 2015-09-19 MED ORDER — ATORVASTATIN CALCIUM 20 MG PO TABS: 20.0000 mg | ORAL_TABLET | Freq: Every day | ORAL | Status: DC

## 2015-09-19 MED ORDER — ESTRADIOL 0.05 MG/24HR TD PTTW: 1.0000 | MEDICATED_PATCH | Freq: Every day | TRANSDERMAL | Status: DC

## 2015-09-19 MED ORDER — LORAZEPAM 1 MG PO TABS: 1.0000 mg | ORAL_TABLET | Freq: Three times a day (TID) | ORAL | Status: DC | PRN

## 2015-09-19 MED ORDER — IBUPROFEN 600 MG PO TABS: 600.0000 mg | ORAL_TABLET | Freq: Three times a day (TID) | ORAL | Status: DC | PRN

## 2015-09-19 MED ORDER — NICOTINE POLACRILEX 2 MG MT GUM: 2.0000 mg | CHEWING_GUM | OROMUCOSAL | Status: DC | PRN

## 2015-09-19 MED ORDER — ASPIRIN 81 MG PO TBEC: 81.0000 mg | DELAYED_RELEASE_TABLET | Freq: Every day | ORAL | Status: DC

## 2015-09-19 MED ORDER — TRIAMCINOLONE ACETONIDE 0.1 % EX CREA: TOPICAL_CREAM | Freq: Two times a day (BID) | CUTANEOUS | Status: DC

## 2015-09-19 MED ORDER — ZIPRASIDONE MESYLATE 20 MG IM SOLR: 20.0000 mg | Freq: Once | INTRAMUSCULAR | Status: DC

## 2015-09-19 MED ORDER — ACETAMINOPHEN 325 MG PO TABS: 650.0000 mg | ORAL_TABLET | ORAL | Status: DC | PRN

## 2015-09-19 MED ORDER — LAMOTRIGINE 25 MG PO TABS: 25.0000 mg | ORAL_TABLET | Freq: Two times a day (BID) | ORAL | Status: DC

## 2015-09-19 MED ORDER — QUETIAPINE FUMARATE 100 MG PO TABS
100.0000 mg | ORAL_TABLET | Freq: Every day | ORAL | Status: DC
  Administered 2015-09-19: 100 mg via ORAL
  Filled 2015-09-19 (×2): qty 1

## 2015-09-19 MED ORDER — QUETIAPINE FUMARATE 100 MG PO TABS: 100.0000 mg | ORAL_TABLET | Freq: Every day | ORAL | Status: DC

## 2015-09-19 MED ORDER — DIPHENHYDRAMINE HCL 50 MG/ML IJ SOLN: 50.0000 mg | Freq: Once | INTRAMUSCULAR | Status: DC

## 2015-09-19 MED ORDER — LORAZEPAM 2 MG/ML IJ SOLN: 2.0000 mg | Freq: Once | INTRAMUSCULAR | Status: DC

## 2015-09-19 MED ORDER — LAMOTRIGINE 25 MG PO TABS
25.0000 mg | ORAL_TABLET | Freq: Two times a day (BID) | ORAL | Status: DC
  Administered 2015-09-19 – 2015-09-20 (×2): 25 mg via ORAL
  Filled 2015-09-19 (×4): qty 1

## 2015-09-19 MED ORDER — TRIHEXYPHENIDYL HCL 2 MG PO TABS
2.0000 mg | ORAL_TABLET | Freq: Two times a day (BID) | ORAL | Status: DC
Start: 2015-09-19 — End: 2015-11-11

## 2015-09-19 MED ORDER — MODAFINIL 100 MG PO TABS: 100.0000 mg | ORAL_TABLET | Freq: Every day | ORAL | Status: DC

## 2015-09-19 MED ORDER — ALUM & MAG HYDROXIDE-SIMETH 200-200-20 MG/5ML PO SUSP: 30.0000 mL | ORAL | Status: DC | PRN

## 2015-09-19 NOTE — Progress Notes (Signed)
[facility] Post 1:1 Observation Documentation  For the first (8) hours following discontinuation of 1:1 precautions, a progress note entry by nursing staff should be documented at least every 2 hours, reflecting the patient's behavior, condition, mood, and conversation.  Use the progress notes for additional entries.  Time 1:1 discontinued:  1120  Patient's Behavior:  Patient is seen in the dayroom stretching while standing at this time.   Patient's Condition:  No distress noted. Respirations are even and unlabored.  Patient's Conversation:  No conversation at this time.   Ned ClinesDopson, Brahm Barbeau E 09/19/2015, 1:06 PM

## 2015-09-19 NOTE — Care Management Utilization Note (Signed)
   Per State Regulation 482.30  This chart was reviewed for necessity with respect to the patient's Admission/ Duration of stay.  Next review date: 09/23/15  Nicolasa Duckingrystal Morrison RN, BSN

## 2015-09-19 NOTE — Progress Notes (Signed)
[facility] Post 1:1 Observation Documentation  For the first (8) hours following discontinuation of 1:1 precautions, a progress note entry by nursing staff should be documented at least every 2 hours, reflecting the patient's behavior, condition, mood, and conversation.  Use the progress notes for additional entries.  Time 1:1 discontinued:  1120  Patient's Behavior:  Patient calm at this time. In the room standing talking to the NP.   Patient's Condition:  No distress noted, respirations are even and unlabored.  Patient's Conversation:  "I want to go outside."   [name], Joshua Davila 09/19/2015, 11:23 AM

## 2015-09-19 NOTE — Progress Notes (Signed)
Recreation Therapy Notes  06.29.2017 approximately 3:30pm. LRT returned to work with patient on stress management techniques. Patient presents initially friendly, however refused recreation therapy tx. As with previous 1:1 patient abruptly changed his mind and asked LRT to practice deep breathing. Patient presents with euthymic affect, disorganized thoughts and is intermittently agitated. Complaining about staff and tx he is receiving. Patient contends that staff is withholding his estrogen patches and changing the prescription in the computer so he is being denied estrogen.   Patient shared he has been punching his mattress, but staff discourages this behavior. When asked what he would like as an alternate patient became agitated with LRT, stating that tx offered is pointless because "learning is habitual and you can't do anything habitual for me."   Patient is [facility] tx and indicated he feels staff is preventing his d/c to the [facility] from happening as quickly as it should.   LRT will continue to attempt tx during admission.   Marykay Lexenise L Philo Kurtz, LRT/CTRS    Harlen Danford L 09/19/2015 5:00 PM

## 2015-09-19 NOTE — [facility] Suicide Risk Assessment (Signed)
[facility] INPATIENT:  Family/Significant Other Suicide Prevention Education  Suicide Prevention Education:  Patient Refusal for Family/Significant Other Suicide Prevention Education: The patient Joshua Davila has refused to provide written consent for family/significant other to be provided Family/Significant Other Suicide Prevention Education during admission and/or prior to discharge.  Physician notified.  SPE completed with pt, as pt refused to consent to family contact. SPI pamphlet provided to pt and pt was encouraged to share information with support network, ask questions, and talk about any concerns relating to SPE. Pt denies access to guns/firearms and verbalized understanding of information provided. Mobile Crisis information also provided to pt.   Smart, Kaiyden Simkin LCSW 09/19/2015, 4:37 PM

## 2015-09-19 NOTE — Progress Notes (Signed)
1:1 Note Patient is resting in his bed. No signs of distress or agitation. Sitter remains at bedside. Will continue to monitor for patient safety.

## 2015-09-19 NOTE — Progress Notes (Signed)
Patient ID: Joshua GrimesJames B Kosier, male   DOB: Jan 03, 1981, 35 y.o.   MRN: [id number]  Patient is seen in the day room standing at this time and is starting to walk to the door. He is calm at this time. His respirations are even and unlabored at this time. He remains disheveled in appearance. No conversation at this time. MHT is within reach and 1:1 is continued for safety. Q15 minute safety checks maintained as well.

## 2015-09-19 NOTE — Progress Notes (Signed)
[facility] Post 1:1 Observation Documentation  For the first (8) hours following discontinuation of 1:1 precautions, a progress note entry by nursing staff should be documented at least every 2 hours, reflecting the patient's behavior, condition, mood, and conversation.  Use the progress notes for additional entries.  Time 1:1 discontinued:  1120  Patient's Behavior:  Patient is standing in the dayroom at this time facing a male patient.  Patient's Condition:  No distress noted, respirations are even and unlabored.  Patient's Conversation:  No conversation heard at this time.  DAR: Pt. Denies SI/HI and A/V Hallucinations. Patient reports sleep is good, appetite is fair-good, energy level is low-normal, and concentration poor-good. He rates depression, anxiety, and hopelessness 5 x 5/10. Patient does not report any pain or discomfort at this time. Support and encouragement provided to the patient. Redirection needed to be provided to patient as well. PRN Ativan administered to patient when needed. Patient is receiving some relief from this. Patient is intrusive and remains with bizarre behavior. Seen placing his mattress against the wall. Writer was able to convince patient to put it back. He does get irritable at times but no physical aggression noted. Q15 minute checks are maintained for safety.

## 2015-09-19 NOTE — [facility] Group Notes (Signed)
[facility] Group Notes:  (Nursing/MHT/Case Management/Adjunct)  Date:  09/19/2015  Time:  9:38 AM  Type of Therapy:  Psychoeducational Skills  Participation Level:  Active  Participation Quality:  Intrusive and Monopolizing  Affect:  Appropriate  Cognitive:  Disorganized  Insight:  None  Engagement in Group:  Distracting, Monopolizing and Off Topic  Modes of Intervention:  Clarification, Confrontation, Discussion, Education and Limit-setting  Summary of Progress/Problems: Joshua Davila attended group but was monopolizing and intrusive throughout. He stated, "is sex a drug?" Writer tried to redirect Joshua Davila by stating it was inappropriate. He stated, "it's not inappropriate we are all adults here." However, this was not the topic of conversation to be discussed during a leisure and lifestyle change group at that particular time. Patient was off topic most of the time.   Joshua Davila, Joshua Davila 09/19/2015, 9:38 AM

## 2015-09-19 NOTE — Discharge Summary (Signed)
Physician Discharge Summary Note  Patient:  Joshua Davila is an 35 y.o., male MRN:  [id number] DOB:  [birth year] Patient phone:  [phone] (home)  Patient address:   [address] [place] [id number],  Total Time spent with patient: 30 minutes  Date of Admission:  09/15/2015  Date of Discharge: 09/19/2015  Reason for Admission: Disorganized behavior & suicidal thoughts.  Principal Problem: Substance-induced psychotic disorder with delusions [facility](HCC)  Discharge Diagnoses: Patient Active Problem List   Diagnosis Date Noted  . Cannabis use disorder, moderate, dependence (HCC) [F12.20] 09/16/2015  . Opioid use disorder, moderate, dependence (HCC) [F11.20] 09/16/2015  . 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 Benzodiazepine dependence (HCC) [F13.20] 10/04/2013  . PTSD (post-traumatic stress disorder) [F43.10] 10/03/2013   Past Psychiatric History: Polysubstance abuse/dependence  Past Medical History:  Past Medical History  Diagnosis Date  . Schizoaffective disorder (HCC)   . PTSD (post-traumatic stress disorder)   . Depression   . Liver disorder   . Gender identity disorder    History reviewed. No pertinent past surgical history.  Family History:  Family History  Problem Relation Age of Onset  . Multiple sclerosis Mother   . Mental illness Neg Hx    Family Psychiatric  History: See H&P  Social History:  History  Alcohol Use  . Yes    Comment: occasional     History  Drug Use  . Yes  . Special: Cocaine, Methamphetamines, Marijuana    Comment: last useof methamphetamines and marijuana yesterday    Social History   Social History  . Marital Status: Single    Spouse Name: N/A  . Number of Children:  N/A  . Years of Education: N/A   Social History Main Topics  . Smoking status: Current Every Day Smoker -- 1.00 packs/day    Types: Cigarettes  . Smokeless tobacco: Never Used  . Alcohol Use: Yes     Comment: occasional  . Drug Use: Yes    Special: Cocaine, Methamphetamines, Marijuana     Comment: last useof methamphetamines and marijuana yesterday  . Sexual Activity: Not Currently   Other Topics Concern  . None   Social History Narrative   Hospital Course: Joshua Davila is a 35 year old Caucasian male, who is on SSD , has a hx of PTSD, gender dysphoria as well as polysubstance abuse , presented IVCed for disorganized behavior and suicidal thoughts. " Pt was IVCd by his husband for bizarre behaviors. Pt was paranoid and not oriented. Pt reports a conflict with his husband. Pt according to spouse was making suicidal statements at home as well as was aggressive to family members. Pt's speech was tangential. Pt had flight of ideas. Pt discussed riding sharks. Pt was answering to internal stimuli. Pt has been diagnosed with Schizophrenia, PTSD, and Gender Dysphoria."  Joshua Davila Joshua Davila([name]) as she liked to be addressed [facility] with her UDS tests reports showing positive Amphetamine, Barbiturate & THC. She admitted abusing and binging on multiple substances since last discharged from this hospital in April of 2017. He presented to the hospital with disorganized behavior & suicidal thoughts. He was admitted under involuntary commitment. He was in need of mood stabilization treatments. Joshua Davila Joshua Davila([name]) received while in this hospital; Seroquel 100 mg for mood control, Paroxetine  20 mg for depression, Lamictal 25 mg bid for mood stabilization, Lorazepam 1 mg prn for anxiety & Artane 2 mg for prevention of EPS. [name]) was also resumed on all her pertinent home medications for her other medical issues that she presented & is being discharged on all her routine & prn medication  regimen as he presented with some agitations. She was at a point put on 1:1 supervision related to disorganized behavior & agitation  She is a transgender male who is on estrogen therapy for male gender transition.   Joshua Davila also was enrolled & participated in the group counseling sessions and AA/NA meetings being offered and held on this unit. She learned coping skills. Joshua Davila is currently being discharged to continue psychiatric care and substance abuse treatment at the [facility] in [place], [place]. She is provided with all the pertinent information required to make this transition without problems. Upon discharge, she remains delusional thoughts, paranoia, disorganized speech, tangential speech & racing thoughts. Joshua Davila will continue to require mood stabilization treatment & close monitoring until her mood improves.  She left [facility] with all belongings in no apparent distress. Transportation per [employer].   Physical Findings:  AIMS: Facial and Oral Movements Muscles of Facial Expression: None, normal Lips and Perioral Area: None, normal Jaw: None, normal Tongue: None, normal,Extremity Movements Upper (arms, wrists, hands, fingers): None, normal Lower (legs, knees, ankles, toes): None, normal, Trunk Movements Neck, shoulders, hips: None, normal, Overall Severity Severity of abnormal movements (highest score from questions above): None, normal Incapacitation due to abnormal movements: None, normal Patient's awareness of abnormal movements (rate only patient's report): No Awareness, Dental Status Current problems with teeth and/or dentures?: No Does patient usually wear dentures?: No  CIWA:  CIWA-Ar Total: 17 COWS:  COWS Total Score: 2  Musculoskeletal: Strength & Muscle Tone: within normal limits Gait & Station: normal Patient leans: N/A  Psychiatric Specialty Exam: See SRA BY MD Review of Systems  Constitutional: Negative.   HENT: Negative.   Eyes: Negative.   Respiratory: Negative.    Cardiovascular: Negative.   Gastrointestinal: Negative.   Genitourinary: Negative.   Musculoskeletal: Negative.   Skin: Positive for itching and rash.  Neurological: Negative.   Endo/Heme/Allergies: Negative.   Psychiatric/Behavioral: Positive for depression (Improving), hallucinations (Hx. Psycosis) and substance abuse. Negative for suicidal ideas (Hx. Polysubstance dependence) and memory loss. The patient has insomnia ("Improving"). The patient is not nervous/anxious.   All other systems reviewed and are negative. See Md's SRA  Blood pressure 115/81, pulse 103, temperature 98.2 F (36.8 C), temperature source Oral, resp. rate 16, height  (1.753 m), weight 77.565 kg (171 lb), SpO2 100 %.Body mass index is 25.24 kg/(m^2).  Have you used any form of tobacco in the last 30 days? (Cigarettes, Smokeless Tobacco, Cigars, and/or Pipes): Yes  Has this patient used any form of tobacco in the last 30 days? (Cigarettes, Smokeless Tobacco, Cigars, and/or Pipes) : Yes, A prescription for an FDA-approved tobacco cessation medication was offered at discharge and the patient refused  Blood Alcohol level:  Lab Results  Component Value Date   [facility] <5 09/12/2015   ETH <5 06/19/2015    Metabolic Disorder Labs:  Lab Results  Component Value Date   HGBA1C 5.1 09/17/2015   MPG 100 09/17/2015   No results found for: PROLACTIN Lab Results  Component Value Date   CHOL 191 09/17/2015   TRIG 196* 09/17/2015   HDL 27* 09/17/2015   CHOLHDL 7.1 09/17/2015   VLDL 39  09/17/2015   LDLCALC 125* 09/17/2015    See Psychiatric Specialty Exam and Suicide Risk Assessment completed by Attending Physician prior to discharge.  Discharge destination:  Other:  [facility]  Is patient on multiple antipsychotic therapies at discharge:  No   Has Patient had three or more failed trials of antipsychotic monotherapy by history:  No  Recommended Plan for Multiple Antipsychotic Therapies: NA     Medication List    ASK your doctor about these medications      Indication   aspirin 81 MG EC tablet  Take 1 tablet (81 mg total) by mouth daily.   Indication:  Mild to Moderate Pain, Heart health     atorvastatin 20 MG tablet  Commonly known as:  LIPITOR  Take 1 tablet (20 mg total) by mouth daily.   Indication:  Inherited Heterozygous Hypercholesterolemia     emtricitabine-tenofovir 200-300 MG tablet  Commonly known as:  TRUVADA  Take 1 tablet by mouth daily. For pre-exposure to HIV   Indication:  Pre-Exposure Prophylaxis of HIV     estradiol 0.05 MG/24HR patch  Commonly known as:  ALORA  Place 1 patch (0.05 mg total) onto the skin daily. For male hormone replacement   Indication:  Deficiency of the Hormone Estrogen     finasteride 5 MG tablet  Commonly known as:  PROSCAR  Take 2 tablets (10 mg total) by mouth daily. For enlarged prostate   Indication:  Enlarged Prostate with Urination Problems     hydrOXYzine 25 MG tablet  Commonly known as:  ATARAX/VISTARIL  Take 1 tablet (25 mg total) by mouth every 6 (six) hours.   Indication:  Anxiety Neurosis     modafinil 100 MG tablet  Commonly known as:  PROVIGIL  Take 1 tablet (100 mg total) by mouth daily.   Indication:  excessive sleepiness     nicotine polacrilex 2 MG gum  Commonly known as:  NICORETTE  Take 1 each (2 mg total) by mouth as needed for smoking cessation.   Indication:  Nicotine Addiction     OLANZapine zydis 5 MG disintegrating tablet  Commonly known as:  ZYPREXA  Take 1 tablet (5 mg total) by mouth 3 (three) times daily as needed (agitation).   Indication:  mood stablilzation     pantoprazole 40 MG tablet  Commonly known as:  PROTONIX  Take 1 tablet (40 mg total) by mouth daily.   Indication:  Gastroesophageal Reflux Disease     PARoxetine 20 MG tablet  Commonly known as:  PAXIL  Take 1 tablet (20 mg total) by mouth daily.   Indication:  Major Depressive Disorder       Follow-up  recommendations: Activity:  As tolerated Diet: As recommended by your primary care doctor. Keep all scheduled follow-up appointments as recommended.   Comments: Patient is instructed prior to discharge to: Take all medications as prescribed by his/her mental healthcare provider. Report any adverse effects and or reactions from the medicines to his/her outpatient provider promptly. Patient has been instructed & cautioned: To not engage in alcohol and or illegal drug use while on prescription medicines. In the event of worsening symptoms, patient is instructed to call the crisis hotline, 911 and or go to the nearest ED for appropriate evaluation and treatment of symptoms. To follow-up with his/her primary care provider for your other medical issues, concerns and or health care needs.   Signed: Sanjuana KavaNwoko, Agnes I, NP, PMHNP-BC 09/19/2015, 4:34 PM

## 2015-09-19 NOTE — Progress Notes (Signed)
[facility] Post 1:1 Observation Documentation  For the first (8) hours following discontinuation of 1:1 precautions, a progress note entry by nursing staff should be documented at least every 2 hours, reflecting the patient's behavior, condition, mood, and conversation.  Use the progress notes for additional entries.  Time 1:1 discontinued:  1120  Patient's Behavior:  Patient is standing at the medication window taking an Ibuprofen.  Patient's Condition:  Patient appears anxious and irritable at this time.   Patient's Conversation:  "Someone's tryin' to fuck with me and fuck with me hard."  Ned ClinesDopson, Cort Dragoo E 09/19/2015, 7:01 PM

## 2015-09-19 NOTE — Progress Notes (Signed)
[facility] Post 1:1 Observation Documentation  For the first (8) hours following discontinuation of 1:1 precautions, a progress note entry by nursing staff should be documented at least every 2 hours, reflecting the patient's behavior, condition, mood, and conversation.  Use the progress notes for additional entries.  Time 1:1 discontinued:  1120  Patient's Behavior:  Patient sitting in the dayroom looking down at the floor.   Patient's Condition:  No distress noted. Patient is calm at this time and respirations are even and unlabored.   Patient's Conversation:  No conversation at this time.  Marzetta BoardDopson, Almeta Geisel E 09/19/2015, 2:59 PM

## 2015-09-19 NOTE — Progress Notes (Signed)
CSW spoke with Joshua Davila in admissions at [facility] [phone] ext [phone]). Application has been received and is in review. Joshua Davila shared that they should have an answer by tomorrow morning.  Trula SladeHeather Smart, MSW, LCSW Clinical Social Worker 09/19/2015 3:43 PM

## 2015-09-19 NOTE — Progress Notes (Signed)
Patient ID: Joshua Davila, male   DOB: 5/[name]20/1982, 35 y.o.   MRN: [id number]  Per MD Lucianne MussKumar, patient does not need a copy of SRA in order to [facility]. Patient does not need SRA to discharge.

## 2015-09-19 NOTE — Progress Notes (Signed)
Per Clydie BraunKaren RN in the ED at the [facility] [place]; [facility] NOT [facility]. Patient is voluntary and the use of an ambulance from the [facility] to [facility] to pick up patient is not considered an appropriate use of funding or services. As [facility] is [facility], it is the responsibility of [facility] to provide resources of transportation to [place]. At this time discharge is on hold. [facility] per Evangeline GulaKaren RN. Patient was notified of hold and verbalizes understanding.

## 2015-09-19 NOTE — Progress Notes (Addendum)
Patient ID: Charlynn GrimesJames B Hosmer, male   DOB: [birth year], 35 y.o.   MRN: [id number] [facility] Progress Note  09/19/2015 3:45 PM Charlynn GrimesJames B Jabs  MRN:  [id number]  Subjective: [name] reports, "I think I'm okay, I guess, no one else thinks I'm. Why do I have someone hanging around me, monitoring everything I do & says. I feel all right. I can go home today, I would not mind at all. I just need transportation money to get [place] to [place], [place] & at least a 7 days worth of medication for I do not have any money to fill my medicines upon discharge. I really do want to get [facility]. I need help with that".  Objective: [name] is a 35 year old Caucasian male, who is on SSD, has a hx of PTSD, gender dysphoria as well as polysubstance abuse, presented IVCed for disorganized behavior and suicidal thoughts.   Patient seen and chart reviewed. Discussed patient with treatment team.  Pt today seen as restless, speech pressured, circumstantial, tangential as well as delusional. Pt continues to be paranoid and feel like his significant other is out to get him and trying to poison him or rape him. However, he is planning on going to a friends house upon discharge or to his mother's house. [facility]. CSW is working on this. he states he does not want cogentin. Although saying he could be discharged today, [name] continue to present as manic. He believed he saw a spider on his pillow, that led to him removing all his bedding including the mattress scattering them all over his room. He complains of his room & under his bed filled with filth. He requires a lot of redirection. His 1:1 supervision has been discontinued with his agreement. He is being monitored closely. Will continue to need support and encouragement.  Principal Problem: Substance-induced psychotic disorder with delusions (HCC) ( Cannabis , stimulant, hx of BZD,Opioids)  Diagnosis:    Patient Active Problem List   Diagnosis Date Noted  . Cannabis use disorder, moderate, dependence (HCC) [F12.20] 09/16/2015  . Opioid use disorder, moderate, dependence (HCC) [F11.20] 09/16/2015  . 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 Benzodiazepine dependence (HCC) [F13.20] 10/04/2013  . PTSD (post-traumatic stress disorder) [F43.10] 10/03/2013   Total Time spent with patient: 15 minutes  Past Psychiatric History: Please see H&P.   Past Medical History:  Past Medical History  Diagnosis Date  . Schizoaffective disorder (HCC)   . PTSD (post-traumatic stress disorder)   . Depression   . Liver disorder   . Gender identity disorder    History reviewed. No pertinent past surgical history.  Family History:  Family History  Problem Relation Age of Onset  . Multiple sclerosis Mother   . Mental illness Neg Hx    Family Psychiatric  History: Please see H&P.  Social History:  History  Alcohol Use  . Yes    Comment: occasional     History  Drug Use  . Yes  . Special: Cocaine, Methamphetamines, Marijuana    Comment: last useof methamphetamines and marijuana yesterday    Social History   Social History  . Marital Status: Single    Spouse Name: N/A  . Number  of Children: N/A  . Years of Education: N/A   Social History Main Topics  . Smoking status: Current Every Day Smoker -- 1.00 packs/day    Types: Cigarettes  . Smokeless tobacco: Never Used  . Alcohol Use: Yes     Comment: occasional  . Drug Use: Yes    Special: Cocaine, Methamphetamines, Marijuana     Comment: last useof methamphetamines and marijuana yesterday  . Sexual Activity: Not Currently   Other Topics Concern  . None   Social History  Narrative   Additional Social History:  Pain Medications: Pt denies Prescriptions: Polysubstance abuse "Pt could not report" Over the Counter: Pt denies History of alcohol / drug use?: Yes Longest period of sobriety (when/how long): unknown Negative Consequences of Use: Financial, Legal, Personal relationships Withdrawal Symptoms: Seizures, Irritability, Sweats, Tremors Onset of Seizures: Four years ago Date of most recent seizure: Four years ago Name of Substance 1: Marijuana 1 - Age of First Use: unknown 1 - Amount (size/oz): unknown 1 - Frequency: daily 1 - Duration: ongoing 1 - Last Use / Amount: unknown  Sleep: Fair  Appetite:  Fair  Current Medications: Current Facility-Administered Medications  Medication Dose Route Frequency Provider Last Rate Last Dose  . acetaminophen (TYLENOL) tablet 650 mg  650 mg Oral Q4H PRN Earney Navy, NP      . alum & mag hydroxide-simeth (MAALOX/MYLANTA) 200-200-20 MG/5ML suspension 30 mL  30 mL Oral PRN Earney Navy, NP      . atorvastatin (LIPITOR) tablet 40 mg  40 mg Oral q1800 Jomarie Longs, MD   40 mg at 09/18/15 1658  . ziprasidone (GEODON) injection 20 mg  20 mg Intramuscular Once Kerry Hough, PA-C   20 mg at 09/18/15 2103   And  . LORazepam (ATIVAN) injection 2 mg  2 mg Intramuscular Once Kerry Hough, PA-C   2 mg at 09/18/15 2103   And  . diphenhydrAMINE (BENADRYL) injection 50 mg  50 mg Intramuscular Once Kerry Hough, PA-C   50 mg at 09/18/15 2103  . estradiol (CLIMARA - Dosed in mg/24 hr) patch 0.05 mg  0.05 mg Transdermal Weekly Saramma Eappen, MD   0.05 mg at 09/17/15 1305  . finasteride (PROSCAR) tablet 10 mg  10 mg Oral Daily Jomarie Longs, MD   10 mg at 09/19/15 0805  . ibuprofen (ADVIL,MOTRIN) tablet 600 mg  600 mg Oral Q8H PRN Earney Navy, NP   600 mg at 09/17/15 1704  . lamoTRIgine (LAMICTAL) tablet 25 mg  25 mg Oral BID Jomarie Longs, MD      . LORazepam (ATIVAN) tablet 1 mg  1 mg Oral Q8H  PRN Earney Navy, NP   1 mg at 09/19/15 0807  . modafinil (PROVIGIL) tablet 100 mg  100 mg Oral Daily Jomarie Longs, MD   100 mg at 09/19/15 0805  . nicotine polacrilex (NICORETTE) gum 2 mg  2 mg Oral PRN Jomarie Longs, MD   2 mg at 09/19/15 1249  . ondansetron (ZOFRAN) tablet 4 mg  4 mg Oral Q8H PRN Earney Navy, NP   4 mg at 09/17/15 1348  . PARoxetine (PAXIL) tablet 20 mg  20 mg Oral Daily Jomarie Longs, MD   20 mg at 09/19/15 0804  . QUEtiapine (SEROQUEL) tablet 100 mg  100 mg Oral QHS Saramma Eappen, MD      . triamcinolone cream (KENALOG) 0.1 %   Topical BID Sanjuana Kava, NP      .  trihexyphenidyl (ARTANE) tablet 2 mg  2 mg Oral BID WC Jomarie Longs, MD   2 mg at 09/19/15 1027   Lab Results:  No results found for this or any previous visit (from the past 48 hour(s)).  Blood Alcohol level:  Lab Results  Component Value Date   ETH <5 09/12/2015   ETH <5 06/19/2015   Metabolic Disorder Labs: Lab Results  Component Value Date   HGBA1C 5.1 09/17/2015   MPG 100 09/17/2015   No results found for: PROLACTIN Lab Results  Component Value Date   CHOL 191 09/17/2015   TRIG 196* 09/17/2015   HDL 27* 09/17/2015   CHOLHDL 7.1 09/17/2015   VLDL 39 09/17/2015   LDLCALC 125* 09/17/2015   Physical Findings: AIMS: Facial and Oral Movements Muscles of Facial Expression: None, normal Lips and Perioral Area: None, normal Jaw: None, normal Tongue: None, normal,Extremity Movements Upper (arms, wrists, hands, fingers): None, normal Lower (legs, knees, ankles, toes): None, normal, Trunk Movements Neck, shoulders, hips: None, normal, Overall Severity Severity of abnormal movements (highest score from questions above): None, normal Incapacitation due to abnormal movements: None, normal Patient's awareness of abnormal movements (rate only patient's report): No Awareness, Dental Status Current problems with teeth and/or dentures?: No Does patient usually wear dentures?: No   CIWA:  CIWA-Ar Total: 17 COWS:  COWS Total Score: 2  Musculoskeletal: Strength & Muscle Tone: within normal limits Gait & Station: normal Patient leans: N/A  Psychiatric Specialty Exam: Physical Exam  Nursing note and vitals reviewed. Constitutional:  I concur with PE done in ED.    Review of Systems  Psychiatric/Behavioral: Positive for depression, hallucinations and substance abuse. The patient is nervous/anxious.   All other systems reviewed and are negative.   Blood pressure 115/81, pulse 103, temperature 98.2 F (36.8 C), temperature source Oral, resp. rate 16, height  (1.753 m), weight 77.565 kg (171 lb), SpO2 100 %.Body mass index is 25.24 kg/(m^2).  General Appearance: Guarded, casual  Eye Contact:  Minimal  Speech:  Pressured  Volume:  Increased  Mood:  Anxious, Hopeless and Irritable, gradually stabilizing  Affect:  Labile  Thought Process:  Irrelevant and Descriptions of Associations: Tangential, circumstantial  Orientation:  Full (Time, Place, and Person)  Thought Content:  Delusions, Hallucinations: Auditory, Paranoid Ideation, Rumination and Tangential  Suicidal Thoughts:  denies , but is paranoid and delusional making him a danger to self or others.  Homicidal Thoughts:  No  Memory:  Immediate;   Fair Recent;   Fair Remote;   Fair  Judgement:  Impaired  Insight:  Shallow  Psychomotor Activity:  Restlessness  Concentration:  Concentration: Fair and Attention Span: Fair  Recall:  [employer] of Knowledge:  Fair  Language:  Fair  Akathisia:  No  Handed:  Right  AIMS (if indicated):     Assets:  Desire for Improvement  ADL's:  Intact  Cognition:  WNL  Sleep:  Number of Hours: 5.25   Treatment Plan Summary: Kriss is a 35 year old Caucasian male, who is on SSD , has a hx of PTSD, gender dysphoria as well as polysubstance abuse , presented IVCed for disorganized behavior and suicidal thoughts. Pt continues to be psychotic and anxious, will continue  treatment.  Daily contact with patient to assess and evaluate symptoms and progress in treatment and Medication management Will continue the Paxil 20 mg po daily for PTSD sx. Will increase Seroquel to100 mg po qhs for sleep/psychosis/mood sx. Will increase the Lamictal 25  mg to bid for mood sx. Will add Artane 2 mg po bid for SE of seroquel. Restarted home medications where indicated, including Provigil - previous dose - see MAR. Will continue to monitor vitals ,medication compliance and treatment side effects while patient is here.  Will monitor for medical issues as well as call consult as needed.  Reviewed labs - Abnormal bilirubin- pending acute hepatitis panel ,HIV ab . TSH- wnl , lipid panel- abnormal - dietician consult , pending hba1c. CSW will continue working on disposition. [facility]. Recreational therapy consult. Patient to participate in therapeutic milieu .   Sanjuana KavaNwoko, Purity Irmen I, NP, PMHNP, FNP-BC 09/19/2015, 3:45 PM

## 2015-09-19 NOTE — Progress Notes (Signed)
Pt did not attend karaoke group this evening.  

## 2015-09-19 NOTE — Progress Notes (Signed)
  [facility] Discharge Plan :  Will you be returning to the same living situation after discharge:  No.[facility] accepted [facility].  At discharge, do you have transportation home?: Yes,  [facility] providing transport for pt tonight Do you have the ability to pay for your medications: Yes,  Medicare/VA connected  Release of information consent forms completed and submitted to medical records by CSW.   Patient to Follow up at: Follow-up Information    [facility] On 09/19/2015.   Why:  You have been accepted for treatment for today.   Contact information:   [address] [place], [place] [address] Phone: [phone] Fax: [phone]      Next level of care provider has access to [facility] Link:no  Safety Planning and Suicide Prevention discussed: Yes,  SPE completed with pt; SPI pamphlet and mobile crisis information provided to pt and he was encouraged to share information with support network.   Have you used any form of tobacco in the last 30 days? (Cigarettes, Smokeless Tobacco, Cigars, and/or Pipes): Yes  Has patient been referred to the Quitline?: Patient refused referral  Patient has been referred for addiction treatment: Yes  Smart, Ovella Manygoats LCSW 09/19/2015, 4:38 PM

## 2015-09-19 NOTE — Progress Notes (Signed)
1:1 Note Patient is awake and calm. His mood is pleasant. He is conversant with sitter and RN.  No signs of distress or agitation. Sitter remains in close proximity. Will continue to monitor for patient safety.

## 2015-09-20 NOTE — Progress Notes (Signed)
Per Joey in admissions, the VA WILL BE PROVING TRANSPORTATION FOR THIS PATIENT FOR TODAY. Admitting doctor: Dr. Katherene PontoLao Yang. Pt accepted to: Building 8; 1st Floor; Room 116 per Apryl in admissions. Jan RN called in report to [employer] Michelle [phone] ext [phone]. EMTALA completed by Jan/Dr. Elna BreslowEappen MD. Discharge note completed on 09/19/15/ Pt is aware of acceptance [facility] the [facility] and is pleasant/calm this morning.   Trula SladeHeather Smart, MSW, LCSW Clinical Social Worker 09/20/2015 10:07 AM

## 2015-09-20 NOTE — Progress Notes (Signed)
[facility]. All items returned with VA transport. EMTALA form and list of medications given to transport service.Pt adequate [facility].

## 2015-10-01 ENCOUNTER — Emergency Department
Admission: EM | Admit: 2015-10-01 | Discharge: 2015-10-04 | Disposition: A | Discharge status: Other Acute Inpatient Hospital | Payer: Medicare Other | Attending: Emergency Medicine | Admitting: Emergency Medicine

## 2015-10-01 DIAGNOSIS — F29 Unspecified psychosis not due to a substance or known physiological condition: Secondary | ICD-10-CM | POA: Insufficient documentation

## 2015-10-01 DIAGNOSIS — Z79899 Other long term (current) drug therapy: Secondary | ICD-10-CM | POA: Insufficient documentation

## 2015-10-01 DIAGNOSIS — F1290 Cannabis use, unspecified, uncomplicated: Secondary | ICD-10-CM | POA: Insufficient documentation

## 2015-10-01 DIAGNOSIS — F250 Schizoaffective disorder, bipolar type: Secondary | ICD-10-CM

## 2015-10-01 DIAGNOSIS — F1490 Cocaine use, unspecified, uncomplicated: Secondary | ICD-10-CM | POA: Diagnosis not present

## 2015-10-01 DIAGNOSIS — F1520 Other stimulant dependence, uncomplicated: Secondary | ICD-10-CM | POA: Diagnosis present

## 2015-10-01 DIAGNOSIS — F4310 Post-traumatic stress disorder, unspecified: Secondary | ICD-10-CM | POA: Insufficient documentation

## 2015-10-01 DIAGNOSIS — Z7982 Long term (current) use of aspirin: Secondary | ICD-10-CM | POA: Diagnosis not present

## 2015-10-01 DIAGNOSIS — F649 Gender identity disorder, unspecified: Secondary | ICD-10-CM | POA: Diagnosis not present

## 2015-10-01 DIAGNOSIS — F17210 Nicotine dependence, cigarettes, uncomplicated: Secondary | ICD-10-CM | POA: Insufficient documentation

## 2015-10-01 DIAGNOSIS — F259 Schizoaffective disorder, unspecified: Secondary | ICD-10-CM | POA: Diagnosis not present

## 2015-10-01 DIAGNOSIS — Z046 Encounter for general psychiatric examination, requested by authority: Secondary | ICD-10-CM | POA: Diagnosis present

## 2015-10-01 DIAGNOSIS — F1990 Other psychoactive substance use, unspecified, uncomplicated: Secondary | ICD-10-CM | POA: Insufficient documentation

## 2015-10-01 DIAGNOSIS — Z791 Long term (current) use of non-steroidal anti-inflammatories (NSAID): Secondary | ICD-10-CM | POA: Diagnosis not present

## 2015-10-01 DIAGNOSIS — F329 Major depressive disorder, single episode, unspecified: Secondary | ICD-10-CM | POA: Diagnosis not present

## 2015-10-01 LAB — COMPREHENSIVE METABOLIC PANEL
ALK PHOS: 52 U/L (ref 38–126)
ALT: 37 U/L (ref 17–63)
AST: 26 U/L (ref 15–41)
Albumin: 4.9 g/dL (ref 3.5–5.0)
Anion gap: 7 (ref 5–15)
BUN: 21 mg/dL — ABNORMAL HIGH (ref 6–20)
CALCIUM: 9.5 mg/dL (ref 8.9–10.3)
CHLORIDE: 106 mmol/L (ref 101–111)
CO2: 24 mmol/L (ref 22–32)
CREATININE: 0.77 mg/dL (ref 0.61–1.24)
Glucose, Bld: 99 mg/dL (ref 65–99)
Potassium: 4.1 mmol/L (ref 3.5–5.1)
Sodium: 137 mmol/L (ref 135–145)
TOTAL PROTEIN: 7.7 g/dL (ref 6.5–8.1)
Total Bilirubin: 0.6 mg/dL (ref 0.3–1.2)

## 2015-10-01 LAB — ETHANOL

## 2015-10-01 LAB — CBC
HEMATOCRIT: 38.1 % — AB (ref 40.0–52.0)
HEMOGLOBIN: 13.6 g/dL (ref 13.0–18.0)
MCH: 29.8 pg (ref 26.0–34.0)
MCHC: 35.8 g/dL (ref 32.0–36.0)
MCV: 83.2 fL (ref 80.0–100.0)
Platelets: 183 10*3/uL (ref 150–440)
RBC: 4.58 MIL/uL (ref 4.40–5.90)
RDW: 14.5 % (ref 11.5–14.5)
WBC: 8.3 10*3/uL (ref 3.8–10.6)

## 2015-10-01 LAB — URINE DRUG SCREEN, QUALITATIVE (ARMC ONLY)
AMPHETAMINES, UR SCREEN: NOT DETECTED
BARBITURATES, UR SCREEN: NOT DETECTED
BENZODIAZEPINE, UR SCRN: NOT DETECTED
COCAINE METABOLITE, UR ~~LOC~~: NOT DETECTED
Cannabinoid 50 Ng, Ur ~~LOC~~: NOT DETECTED
MDMA (Ecstasy)Ur Screen: NOT DETECTED
METHADONE SCREEN, URINE: NOT DETECTED
Opiate, Ur Screen: NOT DETECTED
Phencyclidine (PCP) Ur S: NOT DETECTED
TRICYCLIC, UR SCREEN: NOT DETECTED

## 2015-10-01 LAB — SALICYLATE LEVEL

## 2015-10-01 LAB — ACETAMINOPHEN LEVEL: Acetaminophen (Tylenol), Serum: <10 ug/mL — ABNORMAL LOW (ref 10–30)

## 2015-10-01 MED ORDER — ASPIRIN EC 81 MG PO TBEC
81.0000 mg | DELAYED_RELEASE_TABLET | Freq: Every day | ORAL | Status: DC
  Administered 2015-10-01 – 2015-10-04 (×4): 81 mg via ORAL
  Filled 2015-10-01 (×3): qty 1

## 2015-10-01 MED ORDER — MODAFINIL 100 MG PO TABS
ORAL_TABLET | ORAL | Status: AC
  Filled 2015-10-01: qty 1

## 2015-10-01 MED ORDER — PAROXETINE HCL 20 MG PO TABS
20.0000 mg | ORAL_TABLET | Freq: Every day | ORAL | Status: DC
  Administered 2015-10-01 – 2015-10-04 (×4): 20 mg via ORAL
  Filled 2015-10-01 (×4): qty 1

## 2015-10-01 MED ORDER — ASPIRIN EC 81 MG PO TBEC
DELAYED_RELEASE_TABLET | ORAL | Status: AC
  Administered 2015-10-01: 81 mg via ORAL
  Filled 2015-10-01: qty 1

## 2015-10-01 MED ORDER — ESTRADIOL 0.05 MG/24HR TD PTWK
0.0500 mg | MEDICATED_PATCH | TRANSDERMAL | Status: DC
  Administered 2015-10-01: 0.05 mg via TRANSDERMAL
  Filled 2015-10-01: qty 1

## 2015-10-01 MED ORDER — IBUPROFEN 600 MG PO TABS
600.0000 mg | ORAL_TABLET | Freq: Three times a day (TID) | ORAL | Status: DC | PRN
  Administered 2015-10-02 (×2): 600 mg via ORAL
  Filled 2015-10-01 (×2): qty 1

## 2015-10-01 MED ORDER — EMTRICITABINE-TENOFOVIR AF 200-25 MG PO TABS
1.0000 | ORAL_TABLET | Freq: Every day | ORAL | Status: DC
  Administered 2015-10-01 – 2015-10-04 (×4): 1 via ORAL
  Filled 2015-10-01 (×5): qty 1

## 2015-10-01 MED ORDER — FINASTERIDE 5 MG PO TABS
10.0000 mg | ORAL_TABLET | Freq: Every day | ORAL | Status: DC
  Administered 2015-10-01 – 2015-10-04 (×4): 10 mg via ORAL
  Filled 2015-10-01 (×5): qty 2

## 2015-10-01 MED ORDER — ATORVASTATIN CALCIUM 20 MG PO TABS
20.0000 mg | ORAL_TABLET | Freq: Every day | ORAL | Status: DC
  Administered 2015-10-01 – 2015-10-04 (×4): 20 mg via ORAL
  Filled 2015-10-01 (×4): qty 1

## 2015-10-01 MED ORDER — MODAFINIL 100 MG PO TABS
100.0000 mg | ORAL_TABLET | Freq: Every day | ORAL | Status: DC
  Administered 2015-10-01 – 2015-10-02 (×2): 100 mg via ORAL
  Filled 2015-10-01: qty 1

## 2015-10-01 MED ORDER — LAMOTRIGINE 25 MG PO TABS
25.0000 mg | ORAL_TABLET | Freq: Two times a day (BID) | ORAL | Status: DC
  Administered 2015-10-01 – 2015-10-04 (×7): 25 mg via ORAL
  Filled 2015-10-01 (×7): qty 1

## 2015-10-01 MED ORDER — ALUM & MAG HYDROXIDE-SIMETH 200-200-20 MG/5ML PO SUSP: 30.0000 mL | ORAL | Status: DC | PRN

## 2015-10-01 MED ORDER — ACETAMINOPHEN 325 MG PO TABS
650.0000 mg | ORAL_TABLET | ORAL | Status: DC | PRN
  Administered 2015-10-02 – 2015-10-04 (×5): 650 mg via ORAL
  Filled 2015-10-01 (×4): qty 2

## 2015-10-01 MED ORDER — ACETAMINOPHEN 325 MG PO TABS
ORAL_TABLET | ORAL | Status: AC
  Administered 2015-10-01: 650 mg via ORAL
  Filled 2015-10-01: qty 2

## 2015-10-01 MED ORDER — NICOTINE POLACRILEX 2 MG MT GUM
2.0000 mg | CHEWING_GUM | OROMUCOSAL | Status: DC | PRN
  Administered 2015-10-02 – 2015-10-04 (×5): 2 mg via ORAL
  Filled 2015-10-01 (×10): qty 1

## 2015-10-01 MED ORDER — ACETAMINOPHEN 325 MG PO TABS
650.0000 mg | ORAL_TABLET | Freq: Once | ORAL | Status: AC
  Administered 2015-10-01: 650 mg via ORAL

## 2015-10-01 MED ORDER — QUETIAPINE FUMARATE 25 MG PO TABS
100.0000 mg | ORAL_TABLET | Freq: Every day | ORAL | Status: DC
  Administered 2015-10-01: 100 mg via ORAL
  Filled 2015-10-01: qty 4

## 2015-10-01 NOTE — ED Notes (Signed)
Pt gave permission for mother to have password.

## 2015-10-01 NOTE — ED Provider Notes (Signed)
[facility] Emergency Department Provider Note   ____________________________________________    I have reviewed the triage vital signs and the nursing notes.   HISTORY  Chief Complaint Psychiatric Evaluation     HPI Joshua Davila is a 35 y.o. male who presents under IVC. He was sent via [employer] from [facility] of auditory and visual hallucinations. He was reportedly using a chainsaw in his backyard with his pants down attacking imaginary animals.   Past Medical History  Diagnosis Date  . Schizoaffective disorder (HCC)   . PTSD (post-traumatic stress disorder)   . Depression   . Liver disorder   . Gender identity disorder     Patient Active Problem List   Diagnosis Date Noted  . Cannabis use disorder, moderate, dependence (HCC) 09/16/2015  . Opioid use disorder, moderate, dependence (HCC) 09/16/2015  . Substance or medication-induced bipolar and related disorder with onset during intoxication (HCC) 09/16/2015  . 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 Methamphetamine use disorder, severe 06/20/2015  . Benzodiazepine dependence (HCC) 10/04/2013  . PTSD (post-traumatic stress disorder) 10/03/2013    History reviewed. No pertinent past surgical history.  Current Outpatient Rx  Name  Route  Sig  Dispense  Refill  . acetaminophen (TYLENOL) 325 MG tablet   Oral   Take 2 tablets (650 mg total) by mouth every 4 (four) hours as needed for mild pain or fever (T > 38.3Celsius).         Marland Kitchen aspirin 81 MG EC tablet   Oral   Take 1 tablet (81 mg total) by mouth daily. For heart health   1 tablet   0   . atorvastatin (LIPITOR) 20 MG tablet   Oral   Take 1 tablet (20 mg total) by mouth daily. For high Cholesterol   1 tablet   0   . diphenhydrAMINE (BENADRYL) 50 MG/ML injection   Intramuscular   Inject 1 mL (50 mg total) into the muscle once. For agitation      0   . finasteride (PROSCAR) 5 MG tablet   Oral   Take 2 tablets (10 mg total) by mouth daily. For enlarged prostate         . ibuprofen (ADVIL,MOTRIN) 600 MG tablet   Oral   Take 1 tablet (600 mg total) by mouth every 8 (eight) hours as needed (Temp > 38.3Celsius). For pain   1 tablet   0   . LORazepam (ATIVAN) 1 MG tablet   Oral   Take 1 tablet (1 mg total) by mouth every 8 (eight) hours as needed for anxiety (agitation).   30 tablet   0   . modafinil (PROVIGIL) 100 MG tablet   Oral   Take 1 tablet (100 mg total) by mouth daily. For excessive sleepiness   30 tablet   0   . nicotine polacrilex (NICORETTE) 2 MG gum   Oral   Take 1 each (2 mg total) by mouth as needed for smoking cessation.   100 tablet   0   . ondansetron (ZOFRAN) 4 MG tablet   Oral   Take 1 tablet (4 mg total) by mouth every 8 (eight) hours as needed for nausea or vomiting.   20 tablet   0   . PARoxetine (PAXIL) 20 MG tablet   Oral   Take 1 tablet (20 mg total) by mouth daily. For depression         .  QUEtiapine (SEROQUEL) 100 MG tablet   Oral   Take 1 tablet (100 mg total) by mouth at bedtime. For mood control         . triamcinolone cream (KENALOG) 0.1 %   Topical   Apply topically 2 (two) times daily. For rashes   30 g   0   . trihexyphenidyl (ARTANE) 2 MG tablet   Oral   Take 1 tablet (2 mg total) by mouth 2 (two) times daily with a meal. For prevention of drug induced tremors         . alum & mag hydroxide-simeth (MAALOX/MYLANTA) 200-200-20 MG/5ML suspension   Oral   Take 30 mLs by mouth as needed for indigestion or heartburn.   355 mL   0   . emtricitabine-tenofovir (TRUVADA) 200-300 MG tablet   Oral   Take 1 tablet by mouth daily. For pre-exposure to HIV         . estradiol (ALORA) 0.05 MG/24HR patch   Transdermal   Place 1 patch (0.05 mg total) onto the skin daily. For male hormone replacement   8 patch   12   . lamoTRIgine (LAMICTAL) 25 MG tablet    Oral   Take 1 tablet (25 mg total) by mouth 2 (two) times daily. For mood stabilization Patient not taking: Reported on 10/01/2015         . ziprasidone (GEODON) injection   Intramuscular   Inject 20 mg into the muscle once. For agitation Patient not taking: Reported on 10/01/2015   1 each        Allergies Penicillins and Sulfa antibiotics  Family History  Problem Relation Age of Onset  . Multiple sclerosis Mother   . Mental illness Neg Hx     Social History Social History  Substance Use Topics  . Smoking status: Current Every Day Smoker -- 1.00 packs/day    Types: Cigarettes  . Smokeless tobacco: Never Used  . Alcohol Use: Yes     Comment: occasional    Unable to obtain full Review of Systems due to patient cooperation  Constitutional: No dizziness Eyes: No visual changes.  Cardiovascular: Denies chest pain. Respiratory: Denies shortness of breath. Gastrointestinal: No abdominal pain.    Musculoskeletal: Negative for back pain. Skin: Negative for abrasions or lacerations Neurological: Negative for headaches    ____________________________________________   PHYSICAL EXAM:  VITAL SIGNS: ED Triage Vitals  Enc Vitals Group     BP 10/01/15 1251 144/88 mmHg     Pulse Rate 10/01/15 1251 96     Resp 10/01/15 1251 20     Temp 10/01/15 1251 98.4 F (36.9 C)     Temp Source 10/01/15 1251 Oral     SpO2 10/01/15 1251 97 %     Weight 10/01/15 1247 155 lb (70.308 kg)     Height 10/01/15 1247 6' (1.829 m)     Head Cir --      Peak Flow --      Pain Score --      Pain Loc --      Pain Edu? --      Excl. in GC? --     Constitutional: Alert, Agitated, confused Eyes: Conjunctivae are normal.  Head: Atraumatic.Normocephalic Nose: No congestion/rhinnorhea. Mouth/Throat: Mucous membranes are moist.  Oropharynx non-erythematous. Neck: . Painless ROM Cardiovascular: Normal rate, regular rhythm. Grossly normal heart sounds.  Good peripheral  circulation. Respiratory: Normal respiratory effort.  No retractions. Lungs CTAB. Gastrointestinal: Soft and nontender. No distention.  Genitourinary: deferred Musculoskeletal: No lower extremity tenderness nor edema.  Warm and well perfused Neurologic:  Normal speech and language. No gross focal neurologic deficits are appreciated.  Skin:  Skin is warm, dry and intact. No rash noted. Psychiatric: Patient with tangential flight of ideas, likely hallucinations  ____________________________________________   LABS (all labs ordered are listed, but only abnormal results are displayed)  Labs Reviewed  COMPREHENSIVE METABOLIC PANEL - Abnormal; Notable for the following:    BUN 21 (*)    All other components within normal limits  ACETAMINOPHEN LEVEL - Abnormal; Notable for the following:    Acetaminophen (Tylenol), Serum <10 (*)    All other components within normal limits  CBC - Abnormal; Notable for the following:    HCT 38.1 (*)    All other components within normal limits  ETHANOL  SALICYLATE LEVEL  URINE DRUG SCREEN, QUALITATIVE ([facility] ONLY)   ____________________________________________  EKG  None ____________________________________________  RADIOLOGY  None ____________________________________________   PROCEDURES  Procedure(s) performed: No    Critical Care performed: No ____________________________________________   INITIAL IMPRESSION / ASSESSMENT AND PLAN / ED COURSE  Pertinent labs & imaging results that were available during my care of the patient were reviewed by me and considered in my medical decision making (see chart for details).  Patient with significant agitation, likely hallucinations who presents under IVC. His lab work is unremarkable, patient is medically cleared but will require psychiatry consult. ____________________________________________   FINAL CLINICAL IMPRESSION(S) / ED DIAGNOSES  Final diagnoses:  Psychosis, unspecified  psychosis type      NEW MEDICATIONS STARTED DURING THIS VISIT:  New Prescriptions   No medications on file     Note:  This document was prepared using Dragon voice recognition software and may include unintentional dictation errors.    Jene Every, MD 10/01/15 [phone]

## 2015-10-01 NOTE — ED Notes (Signed)
Pt brought to the ED via [facility] from [facility] with c/o pt having visual and auditory hallucinations..Marland Kitchen

## 2015-10-01 NOTE — Consult Note (Signed)
  Psychiatry: Chart reviewed. Labs reviewed. 35 year old man with a history of mental health problems and substance abuse brought in by police after being found behaving in a bizarre and dangerous manner in public. Patient is currently refusing to talk with me or give any history. Drug screen is negative. [facility]. Not clear what's happened since then. Not able to do a full evaluation at this time. Reviewed with TTS and emergency room physician. Reevaluate tomorrow.

## 2015-10-01 NOTE — BH Assessment (Signed)
Tele Assessment Note   Joshua Davila is an 35 y.o. male, Caucasian, single who presents to [facility] via police for psychosis states Per report in Chart, " Pt was IVCd by his husband for bizarre behaviors. Pt was paranoid and not oriented. Pt reports a conflict with his husband. Pt according to spouse was making suicidal statements at home as well as was aggressive to family members. Pt's speech was tangential. Pt had flight of ideas. Pt discussed riding sharks. Pt was answering to internal stimuli. Pt has been diagnosed with Schizophrenia, PTSD, and Gender Dysphoria."  Patient primary complaint is of being bitten by water moccasin this date and troubles with having to help his grandmother. Patient states that he has had loss in sleep and reports up to 4 hours or less sleep per day. Patient was fixated on receiving V.A. Care and speech was circular in nature.   Patient denies current SI/HI and denies history of. Patient denies AVH, but is fixated on police being out to get him, and with being left alone. Patient acknowledges history of inpatient psychiatric care at multiple facilities for psychosis, but was unclear on exact dates or locations. Patient has been seen at [facility] before for inpatient under Dr.Lugo for dual diagnosis. Patient acknowledges history of S.A. With crystal meth and marijuana, and opiates. Patient was not clear on last use and sobriety periods.  Patient is dressed in scrubs and is alert and oriented x4, but in paranoid  states with fixations on military related structure and police being out to get him. . Patient speech was circular in nature and rapid. Motor behavior appeared exaggerated. Patient thought process is coherent yet fixated on helping his grandmother. Patient does not appear to be responding to internal stimuli. Patient was slightlly cooperative throughout the assessment, but a poor historian.    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  Past Medical History:  Past Medical History  Diagnosis Date  . Schizoaffective disorder (HCC)   . PTSD (post-traumatic stress disorder)   . Depression   . Liver disorder   . Gender identity disorder     History reviewed. No pertinent past surgical history.  Family History:  Family History  Problem Relation Age of Onset  . Multiple sclerosis Mother   . Mental illness Neg Hx     Social History:  reports that he has been smoking Cigarettes.  He has been smoking about 1.00 pack per day. He has never used smokeless tobacco. He reports that he drinks alcohol. 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  Additional Social History:     CIWA: CIWA-Ar BP: (!) 144/88 mmHg Pulse Rate: 96 COWS:    PATIENT STRENGTHS: (choose at least two) Average or above average intelligence Capable of independent living  Allergies:  Allergies  Allergen Reactions  . Penicillins Swelling and Rash    Has patient had a PCN reaction causing immediate rash, facial/tongue/throat swelling, SOB or lightheadedness with hypotension: yes Has patient had a PCN reaction causing severe rash involving mucus membranes or skin necrosis: no Has patient had a PCN reaction that required hospitalization: yes Has patient had a PCN reaction occurring within the last 10 years: no If all of the above answers are "NO", then may proceed with Cephalosporin use.   . Sulfa Antibiotics Rash    Home Medications:  (Not in a hospital admission)  OB/GYN Status:  No LMP for male patient.  General Assessment Data Location of  Assessment: [facility] ED TTS Assessment: In system Is this a Tele or Face-to-Face Assessment?: Face-to-Face Is this an Initial Assessment or a Re-assessment for this encounter?: Initial Assessment Marital status: Single Maiden name: n/a Is patient pregnant?: No Pregnancy Status: No Living Arrangements: Other relatives Can pt return to  current living arrangement?: Yes Admission Status: Involuntary Is patient capable of signing voluntary admission?: Yes Referral Source: Self/Family/Friend Insurance type: Medicare  Medical Screening Exam [facility] Walk-in ONLY) Medical Exam completed: Yes  Crisis Care Plan Living Arrangements: Other relatives Name of Psychiatrist: [facility] [place] Name of Therapist: [facility] [place]  Education Status Is patient currently in school?: No Current Grade: n/a Highest grade of school patient has completed: unknown Name of school: unknown Contact person: none given  Risk to self with the past 6 months Suicidal Ideation: No Has patient been a risk to self within the past 6 months prior to admission? : Yes Suicidal Intent: No Has patient had any suicidal intent within the past 6 months prior to admission? : No Is patient at risk for suicide?: No Suicidal Plan?: No Has patient had any suicidal plan within the past 6 months prior to admission? : No Access to Means: No What has been your use of drugs/alcohol within the last 12 months?: drug use severe Previous Attempts/Gestures: No How many times?: 1 Other Self Harm Risks: none noted Triggers for Past Attempts: Unknown Intentional Self Injurious Behavior: None Family Suicide History: No Recent stressful life event(s): Conflict (Comment), Trauma (Comment) Persecutory voices/beliefs?: Yes Depression: Yes Depression Symptoms: Despondent, Insomnia, Tearfulness Substance abuse history and/or treatment for substance abuse?: Yes Suicide prevention information given to non-admitted patients: Not applicable  Risk to Others within the past 6 months Homicidal Ideation: No Does patient have any lifetime risk of violence toward others beyond the six months prior to admission? : Unknown Thoughts of Harm to Others: No Current Homicidal Intent: No Current Homicidal Plan: No Access to Homicidal Means: No Identified Victim:  (none) History of harm to others?:  No Assessment of Violence: None Noted Violent Behavior Description:  (none admitted by pt.) Does patient have access to weapons?: No Criminal Charges Pending?: No Does patient have a court date: No Is patient on probation?: No  Psychosis Hallucinations: Auditory, Visual Delusions: Unspecified  Mental Status Report Appearance/Hygiene: Bizarre, Disheveled, In scrubs Eye Contact: Poor Motor Activity: Agitation, Freedom of movement, Gait exaggerated Speech: Aggressive, Argumentative, Logical/coherent Level of Consciousness: Alert Mood: Irritable, Preoccupied Affect: Anxious Anxiety Level: Severe Thought Processes: Circumstantial, Flight of Ideas Judgement: Impaired Orientation: Person, Place, Time, Situation, Appropriate for developmental age Obsessive Compulsive Thoughts/Behaviors: Moderate  Cognitive Functioning Concentration: Decreased Memory: Recent Intact, Remote Intact IQ: Average Insight: Poor Impulse Control: Poor Appetite: Fair Weight Loss: 0 Weight Gain: 0 Sleep: Decreased Total Hours of Sleep: 4 Vegetative Symptoms: None  ADLScreening [facility] Assessment Services) Patient's cognitive ability adequate to safely complete daily activities?: Yes Patient able to express need for assistance with ADLs?: Yes Independently performs ADLs?: Yes (appropriate for developmental age)  Prior Inpatient Therapy Prior Inpatient Therapy: Yes Prior Therapy Dates: 2017 Prior Therapy Facilty/Provider(s): multiple per pt. Reason for Treatment: substance absue/ psychosis  Prior Outpatient Therapy Prior Outpatient Therapy: Yes Prior Therapy Dates: current Prior Therapy Facilty/Provider(s): [facility] Reason for Treatment: psychosis symptoms Does patient have an ACCT team?: Unknown Does patient have Intensive In-House Services?  : Unknown Does patient have [facility] services? : No Does patient have P4CC services?: No  ADL Screening (condition at time of admission) Patient's  cognitive ability  adequate to safely complete daily activities?: Yes Is the patient deaf or have difficulty hearing?: No Does the patient have difficulty seeing, even when wearing glasses/contacts?: No Does the patient have difficulty concentrating, remembering, or making decisions?: Yes Patient able to express need for assistance with ADLs?: Yes Does the patient have difficulty dressing or bathing?: No Independently performs ADLs?: Yes (appropriate for developmental age) Does the patient have difficulty walking or climbing stairs?: No Weakness of Legs: None Weakness of Arms/Hands: None  Home Assistive Devices/Equipment Home Assistive Devices/Equipment: None    Abuse/Neglect Assessment (Assessment to be complete while patient is alone) Physical Abuse: Denies Verbal Abuse: Denies Sexual Abuse: Denies Exploitation of patient/patient's resources: Denies Self-Neglect: Denies Values / Beliefs Cultural Requests During Hospitalization: None Spiritual Requests During Hospitalization: None   Advance Directives (For Healthcare) Does patient have an advance directive?: No Would patient like information on creating an advanced directive?: No - patient declined information    Additional Information 1:1 In Past 12 Months?: No CIRT Risk: No Elopement Risk: No Does patient have medical clearance?: Yes     Disposition:  Disposition Initial Assessment Completed for this Encounter: Yes Disposition of Patient: Other dispositions (tbd upon psych consult)  Hipolito BayleyShean k Lemarcus Baggerly 10/01/2015 5:54 PM

## 2015-10-02 DIAGNOSIS — F250 Schizoaffective disorder, bipolar type: Secondary | ICD-10-CM | POA: Diagnosis not present

## 2015-10-02 DIAGNOSIS — Z046 Encounter for general psychiatric examination, requested by authority: Secondary | ICD-10-CM

## 2015-10-02 MED ORDER — ARIPIPRAZOLE 10 MG PO TABS
30.0000 mg | ORAL_TABLET | Freq: Every day | ORAL | Status: DC
  Administered 2015-10-02 – 2015-10-04 (×3): 30 mg via ORAL
  Filled 2015-10-02 (×4): qty 3

## 2015-10-02 MED ORDER — ACETAMINOPHEN 325 MG PO TABS
ORAL_TABLET | ORAL | Status: AC
  Administered 2015-10-02: 650 mg via ORAL
  Filled 2015-10-02: qty 2

## 2015-10-02 NOTE — ED Notes (Signed)
Patient in dayroom watching television. No signs of acute distress noted at this time. Maintained on 15 minute checks and observation by security camera for safety.  

## 2015-10-02 NOTE — Progress Notes (Signed)
Per Dr. Inocencio HomesGayle, TTS to see Dr. Huel CoteQuigley for V.A. Cert referral packet. Chelcie Estorga K. Sherlon HandingHarris, LCAS-A, LPC-A, [facility]  Counselor 10/02/2015 4:05 PM

## 2015-10-02 NOTE — ED Notes (Signed)
Patient currently denies SI and HI. He states that "hears things in his head that gives him migraines". Patient behaves in a bizarre fashion and has an elevated mood. Patient is calm and cooperative, no signs of acute distress noted. Maintained on 15 minute checks and observation by security camera for safety.

## 2015-10-02 NOTE — ED Notes (Signed)
Patient resting quietly in room. No noted distress or abnormal behaviors noted. Will continue 15 minute checks and observation by security camera for safety. 

## 2015-10-02 NOTE — Progress Notes (Signed)
Called [facility] [place] for referral placement for pt. Per consult with Dr. Toni Amendlapacs states if possible V.A. Referral would be of benefit to pt. Care. Spoke  with [facility] [place] psych intake and states there are beds available, to fill out V.A. Form along with referral packet from hospital [facility][assessment, attending physician note, labs, vitals,  and sign cert. Form from ER Dr.]. Fax number is [phone] until 4 p.m. After 4 p.m. [phone]. [facility] Is [phone] extension [phone] or [phone].   This counselor received V.A. Cert packet from ER Secretary glenda and gave to ER Dr. On shift Dr. Inocencio HomesGayle who will fill it out for TTS then to complete referral packet.  Joshua Davila, Joshua Davila, Joshua Davila, [facility]  Counselor 10/02/2015 2:47 PM

## 2015-10-02 NOTE — ED Notes (Signed)
Patient currently in dayroom speaking adamantly to the television. There are no signs of acute distress at this time. Maintained on 15 minute checks and observation by security camera for safety.

## 2015-10-02 NOTE — ED Notes (Signed)
Patient currently taking a shower. No signs of acute distress noted. Maintained on 15 minute checks and observation by security camera for safety.

## 2015-10-02 NOTE — ED Notes (Signed)
Patient came to nurse complaining of a headache rated a 10/10. Nurse administered Tylenol 650 mg. Patient at this time also stated that they would liked to be called "Joshua Davila".

## 2015-10-02 NOTE — ED Notes (Signed)
ENVIRONMENTAL ASSESSMENT Potentially harmful objects out of patient reach: Yes Personal belongings secured: Yes Patient dressed in hospital provided attire only: Yes Plastic bags out of patient reach: Yes Patient care equipment (cords, cables, call bells, lines, and drains) shortened, removed, or accounted for: Yes Equipment and supplies removed from bottom of stretcher: Yes Potentially toxic materials out of patient reach: Yes Sharps container removed or out of patient reach: Yes  Patient is currently in room resting. No signs of distress noted. Maintained on 15 minute checks and observation by security camera for safety.  

## 2015-10-02 NOTE — ED Notes (Signed)
Pt. To BHU from ED ambulatory without difficulty, to room  BHU 1. [facility]. Pt is complaining of a headache 10 out of 10 in pain. Patient would not let this nurse do assessment. Pt. Made aware of security cameras and Q15 minute rounds. Pt. Encouraged to let Nursing staff know of any concerns or needs.

## 2015-10-02 NOTE — ED Notes (Signed)
Patient mother called pleading with staff not to allow patient to be released, stating that discharge would not be safe at this time. Staff assured mother that patient would not be discharged at this time and more information would be relayed per patient's permission when it became available.

## 2015-10-02 NOTE — ED Notes (Signed)

## 2015-10-02 NOTE — ED Notes (Signed)
Patient currently pacing in dayroom and exercising. Patient shows no signs of acute distress. Maintained on 15 minute checks and observation by security camera for safety.

## 2015-10-02 NOTE — ED Notes (Signed)
Sandwich and soft drink given.  

## 2015-10-02 NOTE — ED Notes (Signed)
When nurse attempted to administer abilify to patient, patient was quite skeptical and said that they didn't have an interest in taking the medicine. Nurse informed patient about medication indication, and patient said that they would give it a try. Patient shows no signs of acute distress at this time. Maintained on 15 minute checks and observation by security camera for safety.

## 2015-10-02 NOTE — ED Notes (Signed)
Patient asleep in room. No noted distress or abnormal behavior. Will continue 15 minute checks and observation by security cameras for safety. 

## 2015-10-02 NOTE — Consult Note (Signed)
[facility] Consult   Reason for Consult:  Consult for 35 year old man brought to the hospital by law enforcement after being picked up for bizarre behavior. Referring Physician:  Edd Fabian Patient Identification: Joshua Davila MRN:  [id number] Principal Diagnosis: Schizoaffective disorder [facility]) Diagnosis:   Patient Active Problem List   Diagnosis Date Noted  . Schizoaffective disorder ([place]) [F25.9] 10/02/2015  . Involuntary commitment [Z04.6] 10/02/2015  . Cannabis use disorder, moderate, dependence ([place]) [F12.20] 09/16/2015  . Opioid use disorder, moderate, dependence ([place]) [F11.20] 09/16/2015  . Substance or medication-induced bipolar and related disorder with onset during intoxication ([place]) [F19.94] 09/16/2015  . 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 Benzodiazepine dependence ([place]) [F13.20] 10/04/2013  . PTSD (post-traumatic stress disorder) [F43.10] 10/03/2013  . Polysubstance dependence ([place]) [F19.20] 10/02/2013    Total Time spent with patient: 1 hour  Subjective:   Joshua Davila is a 35 y.o. male patient admitted with "please be quiet"  .  HPI:  Patient interviewed.   Pas chart reviewed. Reviewed old psychiatric notes going back couple years. Labs and vitals reviewed. Patient's mother has also contacted staff here and given some collateral information. This 35 year old man who has chronic mental health and substance abuse problems was brought in by Event organiser. Family had called because of his bizarre behavior. Law enforcement report that they found him stripped almost naked outdoors using a chainsaw. He claimed that he was killing snakes. There were no snakes anywhere around. Patient seemed to be confused. When he was brought into the emergency room he continued to be bizarre and disorganized in his thinking. He would  not cooperate with interview last night. Today he is still fairly uncooperative. Every time I tried to talk with him he told me that my voice was too shrill and he couldn't listen to me. He talked incessantly about hyper religious and confusing subjects with flight of ideas. Patient denies that he had been using any drugs since getting out of the [facility] in the last couple days. Drug screen is negative. He says he has been compliant with his medicine but he is not completely clear about what those are supposed to be. Mother reports that he just got out of the [facility] and when he came home and was even more bizarre in his behavior than he was before going into the hospital.  Social history: Patient apparently had gone home to live with his mother and grandmother. Old notes indicate that he has a husband that he has lived with at times. Not clear what his current social situation is. He is a partially service-connected veteran.  Substance abuse history: Well documented long history of abuse of multiple substances including stimulants. At times his symptoms have been attributed primarily to his substance abuse. Doesn't seem to have much insight or sense about it no evidence that he engages in substance abuse treatment.  Medical history: Denies any significant medical problems outside of the mental health issues.   t Psychiatric Histopatient seems to have a somewhat confusing mental health history. Multiple diagnoses have been proposed. These have included over the years schizoaffective disorder, posttraumatic stress disorder, substance-induced psychosis, primary substance abuse, personality disorder. In particular there seemed to be some confusion in recent notes as to whether his psychotic symptoms were the result of substance abuse alone or of another mental health condition. He has been treated with multiple antipsychotics  in the past. From what I can see it looks like Abilify was the most  consistently successful. Patient has been diagnosed with PTSD although I don't see any documentation anywhere in the chart of anything that sounds like a PTSD symptom. Mostly it sounds like he presents with bizarre agitated manic-like confusion and substance abuse.  Risk to Self: Suicidal Ideation: No Suicidal Intent: No Is patient at risk for suicide?: No Suicidal Plan?: No Access to Means: No What has been your use of drugs/alcohol within the last 12 months?: drug use severe How many times?: 1 Other Self Harm Risks: none noted Triggers for Past Attempts: Unknown Intentional Self Injurious Behavior: None Risk to Others: Homicidal Ideation: No Thoughts of Harm to Others: No Current Homicidal Intent: No Current Homicidal Plan: No Access to Homicidal Means: No Identified Victim:  (none) History of harm to others?: No Assessment of Violence: None Noted Violent Behavior Description:  (none admitted by pt.) Does patient have access to weapons?: No Criminal Charges Pending?: No Does patient have a court date: No Prior Inpatient Therapy: Prior Inpatient Therapy: Yes Prior Therapy Dates: 2017 Prior Therapy Facilty/Provider(s): multiple per pt. Reason for Treatment: substance absue/ psychosis Prior Outpatient Therapy: Prior Outpatient Therapy: Yes Prior Therapy Dates: current Prior Therapy Facilty/Provider(s): [facility] Reason for Treatment: psychosis symptoms Does patient have an ACCT team?: Unknown Does patient have Intensive In-House Services?  : Unknown Does patient have [facility] services? : No Does patient have P4CC services?: No  Past Medical History:  Past Medical History  Diagnosis Date  . Schizoaffective disorder ([place])   . PTSD (post-traumatic stress disorder)   . Depression   . Liver disorder   . Gender identity disorder    History reviewed. No pertinent past surgical history. Family History:  Family History  Problem Relation Age of Onset  . Multiple sclerosis Mother    . Mental illness Neg Hx    Family Psychiatric  HistoPatient is unable or unwilling to give any information about that at this point. Social History:  History  Alcohol Use  . Yes    Comment: occasional     History  Drug Use  . Yes  . Special: Cocaine, Methamphetamines, Marijuana    Comment: last useof methamphetamines and marijuana yesterday    Social History   Social History  . Marital Status: Single    Spouse Name: N/A  . Number of Children: N/A  . Years of Education: N/A   Social History Main Topics  . Smoking status: Current Every Day Smoker -- 1.00 packs/day    Types: Cigarettes  . Smokeless tobacco: Never Used  . Alcohol Use: Yes     Comment: occasional  . Drug Use: Yes    Special: Cocaine, Methamphetamines, Marijuana     Comment: last useof methamphetamines and marijuana yesterday  . Sexual Activity: Not Currently   Other Topics Concern  . None   Social History Narrative   Additional Social History:    Allergies:   Allergies  Allergen Reactions  . Penicillins Swelling and Rash    Has patient had a PCN reaction causing immediate rash, facial/tongue/throat swelling, SOB or lightheadedness with hypotension: yes Has patient had a PCN reaction causing severe rash involving mucus membranes or skin necrosis: no Has patient had a PCN reaction that required hospitalization: yes Has patient had a PCN reaction occurring within the last 10 years: no If all of the above answers are "NO", then may proceed with Cephalosporin use.   . Sulfa Antibiotics  Rash    Labs:  Results for orders placed or performed during the hospital encounter of 10/01/15 (from the past 48 hour(s))  Comprehensive metabolic panel     Status: Abnormal   Collection Time: 10/01/15 12:50 PM  Result Value Ref Range   Sodium 137 135 - 145 mmol/L   Potassium 4.1 3.5 - 5.1 mmol/L   Chloride 106 101 - 111 mmol/L   CO2 24 22 - 32 mmol/L   Glucose, Bld 99 65 - 99 mg/dL   BUN 21 (H) 6 - 20 mg/dL    Creatinine, Ser 0.77 0.61 - 1.24 mg/dL   Calcium 9.5 8.9 - 10.3 mg/dL   Total Protein 7.7 6.5 - 8.1 g/dL   Albumin 4.9 3.5 - 5.0 g/dL   AST 26 15 - 41 U/L   ALT 37 17 - 63 U/L   Alkaline Phosphatase 52 38 - 126 U/L   Total Bilirubin 0.6 0.3 - 1.2 mg/dL   GFR calc non Af Amer >60 >60 mL/min   GFR calc Af Amer >60 >60 mL/min    Comment: (NOTE) The eGFR has been calculated using the CKD EPI equation. This calculation has not been validated in all clinical situations. eGFR's persistently <60 mL/min signify possible Chronic Kidney Disease.    Anion gap 7 5 - 15  Ethanol     Status: None   Collection Time: 10/01/15 12:50 PM  Result Value Ref Range   Alcohol, Ethyl (B) <5 <5 mg/dL    Comment:        LOWEST DETECTABLE LIMIT FOR SERUM ALCOHOL IS 5 mg/dL FOR MEDICAL PURPOSES ONLY   Salicylate level     Status: None   Collection Time: 10/01/15 12:50 PM  Result Value Ref Range   Salicylate Lvl <1.8 2.8 - 30.0 mg/dL  Acetaminophen level     Status: Abnormal   Collection Time: 10/01/15 12:50 PM  Result Value Ref Range   Acetaminophen (Tylenol), Serum <10 (L) 10 - 30 ug/mL    Comment:        THERAPEUTIC CONCENTRATIONS VARY SIGNIFICANTLY. A RANGE OF 10-30 ug/mL MAY BE AN EFFECTIVE CONCENTRATION FOR MANY PATIENTS. HOWEVER, SOME ARE BEST TREATED AT CONCENTRATIONS OUTSIDE THIS RANGE. ACETAMINOPHEN CONCENTRATIONS >150 ug/mL AT 4 HOURS AFTER INGESTION AND >50 ug/mL AT 12 HOURS AFTER INGESTION ARE OFTEN ASSOCIATED WITH TOXIC REACTIONS.   cbc     Status: Abnormal   Collection Time: 10/01/15 12:50 PM  Result Value Ref Range   WBC 8.3 3.8 - 10.6 K/uL   RBC 4.58 4.40 - 5.90 MIL/uL   Hemoglobin 13.6 13.0 - 18.0 g/dL   HCT 38.1 (L) 40.0 - 52.0 %   MCV 83.2 80.0 - 100.0 fL   MCH 29.8 26.0 - 34.0 pg   MCHC 35.8 32.0 - 36.0 g/dL   RDW 14.5 11.5 - 14.5 %   Platelets 183 150 - 440 K/uL  Urine Drug Screen, Qualitative     Status: None   Collection Time: 10/01/15 12:50 PM  Result Value  Ref Range   Tricyclic, Ur Screen NONE DETECTED NONE DETECTED   Amphetamines, Ur Screen NONE DETECTED NONE DETECTED   MDMA (Ecstasy)Ur Screen NONE DETECTED NONE DETECTED   Cocaine Metabolite,Ur [place] NONE DETECTED NONE DETECTED   Opiate, Ur Screen NONE DETECTED NONE DETECTED   Phencyclidine (PCP) Ur S NONE DETECTED NONE DETECTED   Cannabinoid 50 Ng, Ur [place] NONE DETECTED NONE DETECTED   Barbiturates, Ur Screen NONE DETECTED NONE DETECTED   Benzodiazepine, Ur Scrn  NONE DETECTED NONE DETECTED   Methadone Scn, Ur NONE DETECTED NONE DETECTED    Comment: (NOTE) 989  Tricyclics, urine               Cutoff 1000 ng/mL 200  Amphetamines, urine             Cutoff 1000 ng/mL 300  MDMA (Ecstasy), urine           Cutoff 500 ng/mL 400  Cocaine Metabolite, urine       Cutoff 300 ng/mL 500  Opiate, urine                   Cutoff 300 ng/mL 600  Phencyclidine (PCP), urine      Cutoff 25 ng/mL 700  Cannabinoid, urine              Cutoff 50 ng/mL 800  Barbiturates, urine             Cutoff 200 ng/mL 900  Benzodiazepine, urine           Cutoff 200 ng/mL 1000 Methadone, urine                Cutoff 300 ng/mL 1100 1200 The urine drug screen provides only a preliminary, unconfirmed 1300 analytical test result and should not be used for non-medical 1400 purposes. Clinical consideration and professional judgment should 1500 be applied to any positive drug screen result due to possible 1600 interfering substances. A more specific alternate chemical method 1700 must be used in order to obtain a confirmed analytical result.  1800 Gas chromato graphy / mass spectrometry (GC/MS) is the preferred 1900 confirmatory method.     Current Facility-Administered Medications  Medication Dose Route Frequency Provider Last Rate Last Dose  . acetaminophen (TYLENOL) tablet 650 mg  650 mg Oral Q4H PRN Rudene Re, MD   650 mg at 10/02/15 1344  . alum & mag hydroxide-simeth (MAALOX/MYLANTA) 200-200-20 MG/5ML suspension 30 mL   30 mL Oral PRN Rudene Re, MD      . ARIPiprazole (ABILIFY) tablet 30 mg  30 mg Oral Daily Gonzella Lex, MD      . aspirin EC tablet 81 mg  81 mg Oral Daily Rudene Re, MD   81 mg at 10/02/15 1016  . atorvastatin (LIPITOR) tablet 20 mg  20 mg Oral Daily Rudene Re, MD   20 mg at 10/02/15 1017  . emtricitabine-tenofovir AF (DESCOVY) 200-25 MG per tablet 1 tablet  1 tablet Oral Daily Rudene Re, MD   1 tablet at 10/02/15 1017  . estradiol (CLIMARA - Dosed in mg/24 hr) patch 0.05 mg  0.05 mg Transdermal Weekly Rudene Re, MD   0.05 mg at 10/01/15 2031  . finasteride (PROSCAR) tablet 10 mg  10 mg Oral Daily Rudene Re, MD   10 mg at 10/02/15 1016  . ibuprofen (ADVIL,MOTRIN) tablet 600 mg  600 mg Oral Q8H PRN Rudene Re, MD   600 mg at 10/02/15 0117  . lamoTRIgine (LAMICTAL) tablet 25 mg  25 mg Oral BID Rudene Re, MD   25 mg at 10/02/15 1016  . nicotine polacrilex (NICORETTE) gum 2 mg  2 mg Oral PRN Rudene Re, MD   2 mg at 10/02/15 1146  . PARoxetine (PAXIL) tablet 20 mg  20 mg Oral Daily Rudene Re, MD   20 mg at 10/02/15 1018   Current Outpatient Prescriptions  Medication Sig Dispense Refill  . acetaminophen (TYLENOL) 325 MG tablet Take 2 tablets (650 mg total)  by mouth every 4 (four) hours as needed for mild pain or fever (T > 38.3Celsius).    Marland Kitchen aspirin 81 MG EC tablet Take 1 tablet (81 mg total) by mouth daily. For heart health 1 tablet 0  . atorvastatin (LIPITOR) 20 MG tablet Take 1 tablet (20 mg total) by mouth daily. For high Cholesterol 1 tablet 0  . diphenhydrAMINE (BENADRYL) 50 MG/ML injection Inject 1 mL (50 mg total) into the muscle once. For agitation  0  . finasteride (PROSCAR) 5 MG tablet Take 2 tablets (10 mg total) by mouth daily. For enlarged prostate    . ibuprofen (ADVIL,MOTRIN) 600 MG tablet Take 1 tablet (600 mg total) by mouth every 8 (eight) hours as needed (Temp > 38.3Celsius). For pain 1 tablet 0  .  LORazepam (ATIVAN) 1 MG tablet Take 1 tablet (1 mg total) by mouth every 8 (eight) hours as needed for anxiety (agitation). 30 tablet 0  . modafinil (PROVIGIL) 100 MG tablet Take 1 tablet (100 mg total) by mouth daily. For excessive sleepiness 30 tablet 0  . nicotine polacrilex (NICORETTE) 2 MG gum Take 1 each (2 mg total) by mouth as needed for smoking cessation. 100 tablet 0  . ondansetron (ZOFRAN) 4 MG tablet Take 1 tablet (4 mg total) by mouth every 8 (eight) hours as needed for nausea or vomiting. 20 tablet 0  . PARoxetine (PAXIL) 20 MG tablet Take 1 tablet (20 mg total) by mouth daily. For depression    . QUEtiapine (SEROQUEL) 100 MG tablet Take 1 tablet (100 mg total) by mouth at bedtime. For mood control    . triamcinolone cream (KENALOG) 0.1 % Apply topically 2 (two) times daily. For rashes 30 g 0  . trihexyphenidyl (ARTANE) 2 MG tablet Take 1 tablet (2 mg total) by mouth 2 (two) times daily with a meal. For prevention of drug induced tremors    . alum & mag hydroxide-simeth (MAALOX/MYLANTA) 200-200-20 MG/5ML suspension Take 30 mLs by mouth as needed for indigestion or heartburn. 355 mL 0  . emtricitabine-tenofovir (TRUVADA) 200-300 MG tablet Take 1 tablet by mouth daily. For pre-exposure to HIV    . estradiol (ALORA) 0.05 MG/24HR patch Place 1 patch (0.05 mg total) onto the skin daily. For male hormone replacement 8 patch 12  . lamoTRIgine (LAMICTAL) 25 MG tablet Take 1 tablet (25 mg total) by mouth 2 (two) times daily. For mood stabilization (Patient not taking: Reported on 10/01/2015)    . ziprasidone (GEODON) injection Inject 20 mg into the muscle once. For agitation (Patient not taking: Reported on 10/01/2015) 1 each   . [DISCONTINUED] carbamazepine (TEGRETOL XR) 200 MG 12 hr tablet Take 1 tablet (200 mg total) by mouth 2 (two) times daily. For mood stabilization (Patient not taking: Reported on 02/09/2014) 60 tablet 0  . [DISCONTINUED] fluticasone (FLONASE) 50 MCG/ACT nasal spray Place 1  spray into both nostrils daily. For allergies (Patient not taking: Reported on 06/15/2014)  2  . [DISCONTINUED] prazosin (MINIPRESS) 2 MG capsule Take 1 capsule (2 mg total) by mouth at bedtime. For nightmares (Patient not taking: Reported on 02/09/2014) 30 capsule 0    Musculoskeletal: Strength & Muscle Tone: within normal limits Gait & Station: normal Patient leans: N/A  Psychiatric Specialty Exam: Physical Exam  Nursing note and vitals reviewed. Constitutional: He appears well-developed and well-nourished.  HENT:  Head: Normocephalic and atraumatic.  Eyes: Conjunctivae are normal. Pupils are equal, round, and reactive to light.  Neck: Normal range of motion.  Cardiovascular:  Normal heart sounds.   Respiratory: Effort normal.  GI: Soft.  Musculoskeletal: Normal range of motion.  Neurological: He is alert.  Skin: Skin is warm and dry.  Psychiatric: His affect is labile and inappropriate. His speech is rapid and/or pressured and tangential. He is agitated. Thought content is paranoid. He expresses impulsivity. He exhibits abnormal recent memory and abnormal remote memory. He is inattentive.    Review of Systems  Unable to perform ROS: psychiatric disorder    Blood pressure 108/69, pulse 82, temperature 98.2 F (36.8 C), temperature source Oral, resp. rate 16, height _0  (1.753 m), weight 80.74 kg (178 lb), SpO2 99 %.Body mass index is 26.27 kg/(m^2).  General Appearance: Disheveled  Eye Contact:  Minimal  Speech:  Garbled and Pressured  Volume:  Increased  Mood:  Euphoric and Irritable  Affect:  Labile  Thought Process:  Disorganized and Descriptions of Associations: Loose  Orientation:  Negative  Thought Content:  Delusions and Rumination  Suicidal Thoughts:  No  Homicidal Thoughts:  No  Memory:  Negative  Judgement:  Poor  Insight:  Shallow  Psychomotor Activity:  Restlessness  Concentration:  Concentration: Poor  Recall:  Poor  Fund of Knowledge:  Fair  Language:   Fair  Akathisia:  No  Handed:  Right  AIMS (if indicated):     Assets:  Catering manager Housing Physical Health Social Support  ADL's:  Impaired  Cognition:  Impaired,  Mild  Sleep:        Treatment Plan Summary: Daily contact with patient to assess and evaluate symptoms and progress in treatment, Medication management and Plan 35 year old man who currently presents as agitated psychotic with a manic-like psychosis with flight of ideas euphoria hyper religiosity and grandiosity. His drug screen is negative but I also note that he has a history of abuse of things such as K2 and bath salts in the past which might not show up on the drug screen. Patient is every bit as agitated and bizarre today as he was last night. Requires inpatient treatment for stabilization. After reviewing his medicine and I have restarted him on Abilify 30 mg per day. I have also discontinued the Provigil as I can't see any indication for that medicine particularly in someone who abuses amphetamines. Because he is a veteran we have will contact the [facility] system and see if they are willing to take him. If they are on diversion we can reconsider admission to our hospital.  Disposition: Recommend psychiatric Inpatient admission when medically cleared. Supportive therapy provided about ongoing stressors.  Alethia Berthold, MD 10/02/2015 1:54 PM

## 2015-10-02 NOTE — ED Notes (Signed)

## 2015-10-02 NOTE — ED Notes (Signed)
Pt. Alert and oriented, warm and dry, in no distress. Pt. Denies SI, HI, and AVH. Pt. Encouraged to let nursing staff know of any concerns or needs. 

## 2015-10-02 NOTE — ED Notes (Signed)
Patient is in room resting at this time. No signs of acute distress noted. Maintained on 15 minute checks and observation by security camera for safety. Maintained on 15 minute checks and observation by security camera for safety.

## 2015-10-02 NOTE — ED Notes (Signed)
Patient was in dayroom crying to himself stating that "last night it was so dark and creepy I was so scared". Nurse provided reassurance and successfully redirected patient. Patient is now calm and cooperative. Maintained on 15 minute checks and observation by security camera for safety.

## 2015-10-03 NOTE — BH Assessment (Signed)
TTS will follow up on the faxed [facility].  Writer was not able to speak to the patient nurse Amy at [phone].   Writer will contact the nurse once [facility] [place] [facility].

## 2015-10-03 NOTE — BHH Counselor (Signed)
Call and left message with Cordelia PenSherry ([facility]) [phone] regarding the status of bed offered earlier today.

## 2015-10-03 NOTE — BHH Counselor (Signed)
Referral for inpatient [facility].

## 2015-10-03 NOTE — ED Notes (Signed)
Patient resting quietly in room. No noted distress or abnormal behaviors noted. Will continue 15 minute checks and observation by security camera for safety. 

## 2015-10-03 NOTE — ED Notes (Signed)
Patient in dayroom, social with other peers. Meal tray given.

## 2015-10-03 NOTE — ED Notes (Signed)
Pt. Status unchanged still pending placement

## 2015-10-03 NOTE — ED Notes (Signed)
Patient awake, alert, and oriented. He appears to be responding to internal stimuli. Speech/thoughts are disorganized although patient can respond appropriately to assessment questions. Patient took all medications without incident. Maintained on 15 minute checks and observation by security camera for safety.

## 2015-10-03 NOTE — ED Notes (Signed)
Patient awake, in dayroom.  Maintained on 15 minute checks and observation by security camera for safety.

## 2015-10-03 NOTE — ED Notes (Signed)
Patient took a shower. Maintained on 15 minute checks and observation by security camera for safety. 

## 2015-10-03 NOTE — ED Notes (Signed)
Patient visited with his mother. Maintained on 15 minute checks and observation by security camera for safety.

## 2015-10-03 NOTE — ED Provider Notes (Signed)
-----------------------------------------   6:17 AM on 10/03/2015 -----------------------------------------   BP 114/72 mmHg  Pulse 80  Temp(Src) 98.2 F (36.8 C) (Oral)  Resp 18  Ht 5\' 9"  (1.753 m)  Wt 178 lb (80.74 kg)  BMI 26.27 kg/m2  SpO2 100%  No acute events since last update.  Psychiatry does recommend inpatient admission.     Phineas SemenGraydon Chandy Tarman, MD 10/03/15 [phone]

## 2015-10-03 NOTE — ED Notes (Signed)

## 2015-10-04 MED ORDER — SENNOSIDES-DOCUSATE SODIUM 8.6-50 MG PO TABS
ORAL_TABLET | ORAL | Status: AC
  Administered 2015-10-04: 10:00:00
  Filled 2015-10-04: qty 2

## 2015-10-04 MED ORDER — SENNA 8.6 MG PO TABS
2.0000 | ORAL_TABLET | Freq: Every day | ORAL | Status: DC | PRN
  Filled 2015-10-04: qty 2

## 2015-10-04 NOTE — ED Notes (Signed)

## 2015-10-04 NOTE — ED Notes (Signed)
Report was received from Amy H., RN; Pt. Verbalizes no complaints or distress; denies S.I./Hi.; stating, "I want to go home." Continue to monitor with 15 min. Monitoring.

## 2015-10-04 NOTE — ED Notes (Signed)
Patient is in dayroom watching tv, singing and dancing, He is safe, No Si/HI, patient with q 15 min. Checks, and camera monitoring in progress.

## 2015-10-04 NOTE — ED Notes (Addendum)
Patient ate breakfast, he was exercising in room, He denies Si/HI or avh, states that He was watching His grandma yesterday and she has dementia, she is [phone] and He states He  did not get much sleep, He does have pressured, rapid  speech, but is cooperative, likes all lights on, He did complain of constipation, nurse will notify MD about stool softener, Patient is safe, q 15 min. Checks and camera monitoring in progress. Patient is hyper, had shirt off in room exercising He states.

## 2015-10-04 NOTE — ED Notes (Signed)
Patient is in shower. 

## 2015-10-04 NOTE — ED Notes (Signed)
Patient somewhat hyper verbal while on the phone. Maintained on 15 minute checks and observation by security camera for safety.

## 2015-10-04 NOTE — ED Provider Notes (Signed)
-----------------------------------------   6:31 AM on 10/04/2015 -----------------------------------------   Blood pressure 121/83, pulse 71, temperature 98.4 F (36.9 C), temperature source Oral, resp. rate 18, height 5\' 9"  (1.753 m), weight 80.74 kg, SpO2 99 %.  The patient had no acute events since last update.  Calm and cooperative at this time.  Awaiting admission/placement.     Loleta Roseory Laquinn Shippy, MD 10/04/15 [phone]

## 2015-10-04 NOTE — Consult Note (Signed)
  Psychiatry: 35 year old man with history of chronic psychotic disorder. Patient is a service-connected [place] [facility] [place]. He has been under involuntary commitment paperwork in our emergency room for a couple days. Patient continues to be disorganized and bizarre in his thinking and behavior. He has calm down however and is medication compliant. We have been appropriately attempting to get him admitted to a [facility] bed. I have been repeatedly told that he has [facility] [place] but for reasons I don't understand the transfer is being held up. Continue current medicine. Continue plan for transfer to inpatient treatment at the [facility] [place].

## 2015-10-04 NOTE — Progress Notes (Signed)
LCSW  Re faxed all patient information to Sherri at [facility] [phone] ext [phone]. 46 pages went through and LCSW called to confirm they have received it. She did report they have beds and once IVC, vitals, recent staff notes and all required documents received by herself patient will be able to go. Awaiting a call back from [facility]  [place] department LCSW left message.  Called TTS- Team Lead and let her know status of this patient.   [employer] Air LinesClaudine Kairo Laubacher LCSW [phone]

## 2015-10-04 NOTE — Progress Notes (Signed)
LCSW called Peyton NajjarLarry- VA intake transfer coordinator and left a detailed message that [place]. Requested him to call this worker back ASAP.  Notified ED secretary awaiting a specific bed location for patient and will advise once [facility] or [facility] bed [facility].  [employer] Air LinesClaudine Lesleyann Fichter LCSW [phone]

## 2015-10-04 NOTE — ED Notes (Signed)
Report was received from Amy H., RN; Pt. Verbalizes no complaints or distress; denies S.I./Hi. Continue to monitor with 15 min. Monitoring. 

## 2015-10-04 NOTE — ED Notes (Signed)
Patient calm and cooperative. Still experiencing auditory hallucinations but able to distract himself to hold conversation with RN. [facility].Maintained on 15 minute checks and observation by security camera for safety.

## 2015-10-04 NOTE — ED Notes (Signed)
Patient is upset, because He states " I want to go home" it is my grandma's fault I'm here, I don't belong here" He ask to talk with Claudine and she told him the decision had been made for [place] [facility] [place] [facility] and He became loud talking, but He remains cooperative, Patient with q 15 min. Checks, camera surveillance in progress at all times.

## 2015-10-04 NOTE — ED Notes (Signed)
Patient is talking on the phone, no signs of distress at this time.

## 2015-10-04 NOTE — Progress Notes (Signed)
LCSW called Sterling [facility]Sherri Re[facility] and he has been accepted by [facility]Jacal  [facility]  However bed not ready and will be notified as soon as he can transport, awaiting call back.  [employer] Air LinesClaudine Niani Mourer  [phone]

## 2015-10-04 NOTE — ED Notes (Signed)
Nurse reported to PPG Industriesmy harris RN, she will resume care.

## 2015-10-04 NOTE — ED Notes (Signed)
[facility]. Patient is calm and cooperative.

## 2015-10-04 NOTE — ED Notes (Signed)
Patient resting quietly in room. No noted distress or abnormal behaviors noted. Will continue 15 minute checks and observation by security camera for safety. 

## 2015-10-04 NOTE — ED Notes (Signed)
Received meal tray. Maintained on 15 minute checks and observation by security camera for safety.

## 2015-10-04 NOTE — ED Notes (Signed)
Patient is in dayroom dancing, and has blanket tied in several knots wrapped around him oddly. Patient has strange behavior and conduct. Will continue to monitor.

## 2015-10-04 NOTE — ED Notes (Signed)
Patient's lunch tray served, patient without signs of distress.

## 2015-10-04 NOTE — ED Notes (Signed)
Patient has been accepted by Dr. Marcello FennelJakel; per Beacher Mayavid Parrish--[facility]; there is a bed ready for him.

## 2015-10-04 NOTE — ED Notes (Signed)
Patient sleeping ,but easily arouses, patient oriented, He ask about breakfast, nurse told him it should be here soon, patient denies Si/Hi at this time, will continue to monitor.

## 2015-10-04 NOTE — ED Provider Notes (Signed)
Vital signs remained normal. Labs unremarkable. No acute events. Patient remains medically stable for [facility].  Sharman CheekPhillip Everardo Voris, MD 10/04/15 2020

## 2015-10-30 ENCOUNTER — Encounter (HOSPITAL_COMMUNITY): Payer: Self-pay | Admitting: *Deleted

## 2015-10-30 ENCOUNTER — Emergency Department (HOSPITAL_COMMUNITY)
Admission: EM | Admit: 2015-10-30 | Discharge: 2015-11-01 | Disposition: A | Discharge status: Other Acute Inpatient Hospital | Payer: Medicare Other | Attending: Emergency Medicine | Admitting: Emergency Medicine

## 2015-10-30 DIAGNOSIS — R45851 Suicidal ideations: Secondary | ICD-10-CM

## 2015-10-30 DIAGNOSIS — F4310 Post-traumatic stress disorder, unspecified: Secondary | ICD-10-CM | POA: Diagnosis not present

## 2015-10-30 DIAGNOSIS — Z79899 Other long term (current) drug therapy: Secondary | ICD-10-CM | POA: Diagnosis not present

## 2015-10-30 DIAGNOSIS — F250 Schizoaffective disorder, bipolar type: Secondary | ICD-10-CM

## 2015-10-30 DIAGNOSIS — R451 Restlessness and agitation: Secondary | ICD-10-CM | POA: Diagnosis present

## 2015-10-30 DIAGNOSIS — F17210 Nicotine dependence, cigarettes, uncomplicated: Secondary | ICD-10-CM | POA: Insufficient documentation

## 2015-10-30 DIAGNOSIS — Z7982 Long term (current) use of aspirin: Secondary | ICD-10-CM | POA: Insufficient documentation

## 2015-10-30 DIAGNOSIS — F1910 Other psychoactive substance abuse, uncomplicated: Secondary | ICD-10-CM

## 2015-10-30 LAB — CBC
HCT: 39.9 % (ref 39.0–52.0)
Hemoglobin: 13.6 g/dL (ref 13.0–17.0)
MCH: 29.6 pg (ref 26.0–34.0)
MCHC: 34.1 g/dL (ref 30.0–36.0)
MCV: 86.7 fL (ref 78.0–100.0)
PLATELETS: 207 10*3/uL (ref 150–400)
RBC: 4.6 MIL/uL (ref 4.22–5.81)
RDW: 14.3 % (ref 11.5–15.5)
WBC: 10.3 10*3/uL (ref 4.0–10.5)

## 2015-10-30 LAB — COMPREHENSIVE METABOLIC PANEL
ALT: 26 U/L (ref 17–63)
ANION GAP: 8 (ref 5–15)
AST: 33 U/L (ref 15–41)
Albumin: 4.7 g/dL (ref 3.5–5.0)
Alkaline Phosphatase: 41 U/L (ref 38–126)
BILIRUBIN TOTAL: 0.5 mg/dL (ref 0.3–1.2)
BUN: 9 mg/dL (ref 6–20)
CHLORIDE: 106 mmol/L (ref 101–111)
CO2: 24 mmol/L (ref 22–32)
Calcium: 9.6 mg/dL (ref 8.9–10.3)
Creatinine, Ser: 0.8 mg/dL (ref 0.61–1.24)
Glucose, Bld: 106 mg/dL — ABNORMAL HIGH (ref 65–99)
POTASSIUM: 3.8 mmol/L (ref 3.5–5.1)
Sodium: 138 mmol/L (ref 135–145)
TOTAL PROTEIN: 7.8 g/dL (ref 6.5–8.1)

## 2015-10-30 LAB — RAPID URINE DRUG SCREEN, HOSP PERFORMED
AMPHETAMINES: NOT DETECTED
BENZODIAZEPINES: NOT DETECTED
Barbiturates: NOT DETECTED
COCAINE: NOT DETECTED
OPIATES: NOT DETECTED
Tetrahydrocannabinol: POSITIVE — AB

## 2015-10-30 LAB — ACETAMINOPHEN LEVEL

## 2015-10-30 LAB — SALICYLATE LEVEL: Salicylate Lvl: <4 mg/dL (ref 2.8–30.0)

## 2015-10-30 LAB — ETHANOL

## 2015-10-30 MED ORDER — NICOTINE 21 MG/24HR TD PT24
21.0000 mg | MEDICATED_PATCH | Freq: Every day | TRANSDERMAL | Status: DC
  Administered 2015-10-30 – 2015-10-31 (×2): 21 mg via TRANSDERMAL
  Filled 2015-10-30 (×2): qty 1

## 2015-10-30 MED ORDER — EMTRICITABINE-TENOFOVIR AF 200-25 MG PO TABS
1.0000 | ORAL_TABLET | Freq: Every day | ORAL | Status: DC
  Administered 2015-10-30 – 2015-11-01 (×3): 1 via ORAL
  Filled 2015-10-30 (×3): qty 1

## 2015-10-30 MED ORDER — ATORVASTATIN CALCIUM 20 MG PO TABS
20.0000 mg | ORAL_TABLET | Freq: Every day | ORAL | Status: DC
  Administered 2015-10-30 – 2015-11-01 (×3): 20 mg via ORAL
  Filled 2015-10-30 (×3): qty 1

## 2015-10-30 MED ORDER — FINASTERIDE 5 MG PO TABS
10.0000 mg | ORAL_TABLET | Freq: Every day | ORAL | Status: DC
  Administered 2015-10-30 – 2015-11-01 (×2): 10 mg via ORAL
  Filled 2015-10-30 (×3): qty 2

## 2015-10-30 MED ORDER — PAROXETINE HCL 20 MG PO TABS
20.0000 mg | ORAL_TABLET | Freq: Every day | ORAL | Status: DC
  Administered 2015-10-30 – 2015-11-01 (×3): 20 mg via ORAL
  Filled 2015-10-30 (×3): qty 1

## 2015-10-30 MED ORDER — ALUM & MAG HYDROXIDE-SIMETH 200-200-20 MG/5ML PO SUSP: 30.0000 mL | ORAL | Status: DC | PRN

## 2015-10-30 MED ORDER — TRIHEXYPHENIDYL HCL 2 MG PO TABS
2.0000 mg | ORAL_TABLET | Freq: Two times a day (BID) | ORAL | Status: DC
  Administered 2015-10-30 – 2015-11-01 (×4): 2 mg via ORAL
  Filled 2015-10-30 (×4): qty 1

## 2015-10-30 MED ORDER — LORAZEPAM 1 MG PO TABS
1.0000 mg | ORAL_TABLET | Freq: Three times a day (TID) | ORAL | Status: DC | PRN
  Administered 2015-10-30 – 2015-11-01 (×4): 1 mg via ORAL
  Filled 2015-10-30 (×4): qty 1

## 2015-10-30 MED ORDER — IBUPROFEN 200 MG PO TABS
600.0000 mg | ORAL_TABLET | Freq: Three times a day (TID) | ORAL | Status: DC | PRN
  Administered 2015-10-30: 600 mg via ORAL
  Filled 2015-10-30: qty 3

## 2015-10-30 MED ORDER — MODAFINIL 200 MG PO TABS
100.0000 mg | ORAL_TABLET | Freq: Every day | ORAL | Status: DC
  Administered 2015-10-30 – 2015-11-01 (×3): 100 mg via ORAL
  Filled 2015-10-30 (×3): qty 1

## 2015-10-30 MED ORDER — ONDANSETRON HCL 4 MG PO TABS: 4.0000 mg | ORAL_TABLET | Freq: Three times a day (TID) | ORAL | Status: DC | PRN

## 2015-10-30 MED ORDER — QUETIAPINE FUMARATE 100 MG PO TABS
100.0000 mg | ORAL_TABLET | Freq: Every day | ORAL | Status: DC
  Administered 2015-10-30 – 2015-10-31 (×2): 100 mg via ORAL
  Filled 2015-10-30 (×2): qty 1
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
## 2015-10-30 NOTE — ED Notes (Signed)
Pt admitted to room #36, IVC.  When pt asked what brought him to the hospital pt responds "The police" Pt irritable at times, tangential, disorganized,bizarre. Pt denies SI/HI. Pt denies AVH, but appears to be responding to internal stimuli. Special checks q 15 mins in place for safety. Video monitoring in place.
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
## 2015-10-30 NOTE — ED Notes (Signed)
Pt. A & O x 3. Pt. is anxious and has disorganized thoughts. Pt. is currently ambulating in halls. No AVH observed at this time. Pt. denies SI/HI at this time. Pain addressed as 6/10 in mid back and medication given. Safety maintained with Q15 checks.

## 2015-10-31 DIAGNOSIS — F250 Schizoaffective disorder, bipolar type: Secondary | ICD-10-CM

## 2015-10-31 MED ORDER — NICOTINE POLACRILEX 2 MG MT GUM
2.0000 mg | CHEWING_GUM | OROMUCOSAL | Status: DC | PRN
  Administered 2015-10-31 – 2015-11-01 (×5): 2 mg via ORAL
  Filled 2015-10-31 (×5): qty 1

## 2015-10-31 MED ORDER — QUETIAPINE FUMARATE 100 MG PO TABS
100.0000 mg | ORAL_TABLET | Freq: Once | ORAL | Status: AC
  Administered 2015-10-31: 100 mg via ORAL
  Filled 2015-10-31: qty 1

## 2015-10-31 NOTE — BH Assessment (Signed)
[facility] Assessment Progress Note  Per Leata MouseJanardhana Jonnalagadda, MD, this pt requires psychiatric hospitalization at this time.  The following facilities have been contacted to seek placement for this pt, with results as noted:  Beds available, information sent, decision pending:  [facility]   At capacity:  [facility]   Raylon Lams[place], [place] Triage Specialist [phone]
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
## 2015-10-31 NOTE — ED Notes (Signed)
Up tot he bathroom to shower and change scrubs 

## 2015-10-31 NOTE — ED Notes (Signed)
Dr J into see 

## 2015-10-31 NOTE — ED Notes (Signed)
In day room watching trv
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
## 2015-10-31 NOTE — ED Notes (Signed)
Ativan effective, patient in room resting.

## 2015-10-31 NOTE — ED Notes (Signed)
up in hall/dayroom patient occasionally making martial arts gestures with arms/hands

## 2015-10-31 NOTE — Progress Notes (Signed)
10/31/15 1350:  LRT played [employer] with pt and peer.  Pt was anxious at start of activity.  Pt was redirectable, cooperative and engaged in activity.  Pt socialized with peer and staff.  Pt was bright and stated he enjoyed the activity.  Caroll RancherMarjette Junice Fei, LRT/CTRS

## 2015-10-31 NOTE — ED Notes (Signed)
Up to the bathroom 

## 2015-10-31 NOTE — ED Notes (Signed)
Up in hall, angry about having to stay, concerned about his dog at home.

## 2015-10-31 NOTE — ED Notes (Signed)
In day room playing ball w/ other pt and rec. therapist

## 2015-10-31 NOTE — ED Notes (Signed)
Patient noted to have signs of agitation administered prn Ativan.

## 2015-10-31 NOTE — ED Notes (Signed)
Pt resting at present, no distress noted, calm & cooperative. Awake, alert & responsive.  Monitoring for safety, Q 15 min checks in effect.

## 2015-10-31 NOTE — ED Notes (Signed)
Patient denies pain and is resting comfortably.  

## 2015-11-01 ENCOUNTER — Inpatient Hospital Stay (HOSPITAL_COMMUNITY)
Admission: AD | Admit: 2015-11-01 | Discharge: 2015-11-11 | DRG: 885 | Disposition: A | Discharge status: Home / Self Care | Payer: Medicare Other | Source: Intra-hospital | Attending: Psychiatry | Admitting: Psychiatry

## 2015-11-01 ENCOUNTER — Encounter (HOSPITAL_COMMUNITY): Payer: Self-pay

## 2015-11-01 DIAGNOSIS — F640 Transsexualism: Secondary | ICD-10-CM | POA: Diagnosis not present

## 2015-11-01 DIAGNOSIS — F1220 Cannabis dependence, uncomplicated: Secondary | ICD-10-CM | POA: Diagnosis present

## 2015-11-01 DIAGNOSIS — F1510 Other stimulant abuse, uncomplicated: Secondary | ICD-10-CM | POA: Diagnosis present

## 2015-11-01 DIAGNOSIS — F250 Schizoaffective disorder, bipolar type: Secondary | ICD-10-CM | POA: Diagnosis not present

## 2015-11-01 DIAGNOSIS — Z820 Family history of epilepsy and other diseases of the nervous system: Secondary | ICD-10-CM

## 2015-11-01 DIAGNOSIS — F17210 Nicotine dependence, cigarettes, uncomplicated: Secondary | ICD-10-CM | POA: Diagnosis present

## 2015-11-01 DIAGNOSIS — F4310 Post-traumatic stress disorder, unspecified: Secondary | ICD-10-CM | POA: Diagnosis present

## 2015-11-01 DIAGNOSIS — R45851 Suicidal ideations: Secondary | ICD-10-CM | POA: Diagnosis present

## 2015-11-01 DIAGNOSIS — Z79899 Other long term (current) drug therapy: Secondary | ICD-10-CM | POA: Diagnosis not present

## 2015-11-01 DIAGNOSIS — G4700 Insomnia, unspecified: Secondary | ICD-10-CM | POA: Diagnosis present

## 2015-11-01 DIAGNOSIS — F1120 Opioid dependence, uncomplicated: Secondary | ICD-10-CM | POA: Diagnosis present

## 2015-11-01 DIAGNOSIS — F649 Gender identity disorder, unspecified: Secondary | ICD-10-CM | POA: Diagnosis present

## 2015-11-01 DIAGNOSIS — F1910 Other psychoactive substance abuse, uncomplicated: Secondary | ICD-10-CM | POA: Diagnosis not present

## 2015-11-01 DIAGNOSIS — Z9114 Patient's other noncompliance with medication regimen: Secondary | ICD-10-CM

## 2015-11-01 DIAGNOSIS — F909 Attention-deficit hyperactivity disorder, unspecified type: Secondary | ICD-10-CM | POA: Diagnosis present

## 2015-11-01 MED ORDER — IBUPROFEN 200 MG PO TABS
600.0000 mg | ORAL_TABLET | Freq: Three times a day (TID) | ORAL | Status: DC | PRN
  Administered 2015-11-01: 600 mg via ORAL
  Filled 2015-11-01: qty 3

## 2015-11-01 MED ORDER — DIVALPROEX SODIUM 500 MG PO DR TAB
1000.0000 mg | DELAYED_RELEASE_TABLET | Freq: Once | ORAL | Status: AC
  Administered 2015-11-01: 1000 mg via ORAL
  Filled 2015-11-01: qty 2

## 2015-11-01 MED ORDER — ONDANSETRON HCL 4 MG PO TABS: 4.0000 mg | ORAL_TABLET | Freq: Three times a day (TID) | ORAL | Status: DC | PRN

## 2015-11-01 MED ORDER — TRIHEXYPHENIDYL HCL 2 MG PO TABS
2.0000 mg | ORAL_TABLET | Freq: Two times a day (BID) | ORAL | Status: DC
  Administered 2015-11-01 – 2015-11-04 (×6): 2 mg via ORAL
  Filled 2015-11-01 (×9): qty 1

## 2015-11-01 MED ORDER — EMTRICITABINE-TENOFOVIR AF 200-25 MG PO TABS
1.0000 | ORAL_TABLET | Freq: Every day | ORAL | Status: DC
  Administered 2015-11-02 – 2015-11-11 (×8): 1 via ORAL
  Filled 2015-11-01 (×12): qty 1

## 2015-11-01 MED ORDER — QUETIAPINE FUMARATE 100 MG PO TABS
100.0000 mg | ORAL_TABLET | Freq: Every day | ORAL | Status: DC
  Administered 2015-11-01: 100 mg via ORAL
  Filled 2015-11-01 (×4): qty 1

## 2015-11-01 MED ORDER — ACETAMINOPHEN 325 MG PO TABS
650.0000 mg | ORAL_TABLET | Freq: Four times a day (QID) | ORAL | Status: DC | PRN
  Administered 2015-11-04 – 2015-11-10 (×7): 650 mg via ORAL
  Filled 2015-11-01 (×7): qty 2

## 2015-11-01 MED ORDER — MAGNESIUM HYDROXIDE 400 MG/5ML PO SUSP: 30.0000 mL | Freq: Every day | ORAL | Status: DC | PRN

## 2015-11-01 MED ORDER — PAROXETINE HCL 20 MG PO TABS
20.0000 mg | ORAL_TABLET | Freq: Every day | ORAL | Status: DC
  Administered 2015-11-02 – 2015-11-05 (×4): 20 mg via ORAL
  Filled 2015-11-01 (×6): qty 1

## 2015-11-01 MED ORDER — DIVALPROEX SODIUM ER 500 MG PO TB24
500.0000 mg | ORAL_TABLET | Freq: Two times a day (BID) | ORAL | Status: DC
  Administered 2015-11-01 – 2015-11-04 (×6): 500 mg via ORAL
  Filled 2015-11-01 (×10): qty 1

## 2015-11-01 MED ORDER — FINASTERIDE 5 MG PO TABS
10.0000 mg | ORAL_TABLET | Freq: Every day | ORAL | Status: DC
  Filled 2015-11-01 (×6): qty 2

## 2015-11-01 MED ORDER — MODAFINIL 200 MG PO TABS: 100.0000 mg | ORAL_TABLET | Freq: Every day | ORAL | Status: DC

## 2015-11-01 MED ORDER — OLANZAPINE 10 MG PO TBDP
10.0000 mg | ORAL_TABLET | Freq: Two times a day (BID) | ORAL | Status: DC
  Administered 2015-11-01: 10 mg via ORAL
  Filled 2015-11-01: qty 1

## 2015-11-01 MED ORDER — DIVALPROEX SODIUM ER 500 MG PO TB24: 500.0000 mg | ORAL_TABLET | Freq: Two times a day (BID) | ORAL | Status: DC

## 2015-11-01 MED ORDER — IBUPROFEN 600 MG PO TABS
600.0000 mg | ORAL_TABLET | Freq: Three times a day (TID) | ORAL | Status: DC | PRN
  Administered 2015-11-04 – 2015-11-10 (×3): 600 mg via ORAL
  Filled 2015-11-01 (×4): qty 1

## 2015-11-01 MED ORDER — NICOTINE POLACRILEX 2 MG MT GUM
2.0000 mg | CHEWING_GUM | OROMUCOSAL | Status: DC | PRN
  Administered 2015-11-01 – 2015-11-11 (×28): 2 mg via ORAL
  Filled 2015-11-01 (×11): qty 1

## 2015-11-01 MED ORDER — LORAZEPAM 1 MG PO TABS
1.0000 mg | ORAL_TABLET | Freq: Three times a day (TID) | ORAL | Status: DC | PRN
  Administered 2015-11-01 – 2015-11-10 (×14): 1 mg via ORAL
  Filled 2015-11-01 (×13): qty 1

## 2015-11-01 MED ORDER — ATORVASTATIN CALCIUM 20 MG PO TABS
20.0000 mg | ORAL_TABLET | Freq: Every day | ORAL | Status: DC
  Administered 2015-11-02 – 2015-11-11 (×10): 20 mg via ORAL
  Filled 2015-11-01 (×2): qty 1
  Filled 2015-11-01: qty 2
  Filled 2015-11-01 (×10): qty 1

## 2015-11-01 MED ORDER — MODAFINIL 200 MG PO TABS
100.0000 mg | ORAL_TABLET | Freq: Every day | ORAL | Status: DC
  Administered 2015-11-02 – 2015-11-05 (×4): 100 mg via ORAL
  Filled 2015-11-01 (×4): qty 1

## 2015-11-01 MED ORDER — ALUM & MAG HYDROXIDE-SIMETH 200-200-20 MG/5ML PO SUSP: 30.0000 mL | ORAL | Status: DC | PRN

## 2015-11-01 MED ORDER — OLANZAPINE 10 MG PO TBDP
10.0000 mg | ORAL_TABLET | Freq: Two times a day (BID) | ORAL | Status: DC
  Administered 2015-11-01 – 2015-11-03 (×4): 10 mg via ORAL
  Filled 2015-11-01 (×9): qty 1
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
## 2015-11-01 NOTE — Progress Notes (Signed)
11/01/15 1350:  LRT went on over some stress management techniques with pt.  LRT introduced pt to deep breathing techniques and progressive muscle relaxation.  During the activity, LRT played the relaxing sounds of nature and read scripts on deep breathing and progressive muscle relaxation to the pt.  Pt followed along with the scripts and was very engaged.  Pt became drowsy during activity.  Pt also complained of a headache, in which pt left to inform his nurse.  Pt returned and we continued with the stress management group.   Caroll RancherMarjette Chanel Mckesson, LRT/CTRS

## 2015-11-01 NOTE — Progress Notes (Signed)
Patient ID: Joshua Davila, male   DOB: 1980/06/25, 35 y.o.   MRN: [id number] Per State regulations 482.30 this chart was reviewed for medical necessity with respect to the patient's admission/duration of stay.    Next review date: 11/05/15  Thurman CoyerEric Armine Rizzolo, BSN, RN-BC  Case Manager

## 2015-11-01 NOTE — ED Notes (Signed)
Pt discharged ambulatory with [employer].  Pt was in no distress and calm and cooperative.  All belongings sent with patient.

## 2015-11-01 NOTE — Progress Notes (Signed)
Adult Psychoeducational Group Note  Date:  11/01/2015 Time:  9:48 PM  Group Topic/Focus:  Wrap-Up Group:   The focus of this group is to help patients review their daily goal of treatment and discuss progress on daily workbooks.   Participation Level:  Active  Participation Quality:  Redirectable  Affect:  Labile  Cognitive:  Delusional and Lacking  Insight: Limited  Engagement in Group:  Distracting and Off Topic  Modes of Intervention:  Socialization and Support  Additional Comments:  [name] engaged in wrap up group and shared that he wanted to go home and was ready to be discharged. He became disruptive at times and wanted to talk when others were sharing, however he was able to be redirected. He reports being compliant with medications.  Joshua Davila 11/01/2015, 9:48 PM

## 2015-11-01 NOTE — BH Assessment (Signed)
[facility] Assessment Progress Note  Per Leata MouseJanardhana Jonnalagadda, MD, this pt requires psychiatric hospitalization at this time.  Lillia AbedLindsay, RN, [id number]  Pt presents under IVC, and [facility]  Pt's nurse, Kendal Hymendie, has been notified, and agrees to call report to [phone].  Pt is to be transported via [employer].  Doylene Canninghomas Brittannie Tawney, MA Triage Specialist [phone]

## 2015-11-01 NOTE — Consult Note (Signed)
[facility] Consult   Reason for Consult:  psychosis Referring Physician:  EDP Patient Identification: GWEN EDLER MRN:  [id number] Principal Diagnosis: Schizoaffective disorder, bipolar type [facility]) Diagnosis:   Patient Active Problem List   Diagnosis Date Noted  . Schizoaffective disorder, bipolar type ([place]) [F25.0] 10/02/2015    Priority: High  . PTSD (post-traumatic stress disorder) [F43.10] 10/03/2013    Priority: High  . Methamphetamine use disorder, severe [F15.90] 06/20/2015    Priority: Low  . Polysubstance dependence ([place]) [F19.20] 10/02/2013    Priority: Low  . Involuntary commitment [Z04.6] 10/02/2015  . Cannabis use disorder, moderate, dependence ([place]) [F12.20] 09/16/2015  . Opioid use disorder, moderate, dependence ([place]) [F11.20] 09/16/2015  . Substance or medication-induced bipolar and related disorder with onset during intoxication ([place]) [F19.94] 09/16/2015  . Substance-induced psychotic disorder with delusions ([place]) [F19.950] 09/16/2015  . 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 Benzodiazepine dependence ([place]) [F13.20] 10/04/2013    Total Time spent with patient: 45 minutes  Subjective:   KEONE KAMER is a 35 y.o. male patient admitted with psychosis.  HPI:  35 yo male who presented to the ED with psychosis and paranoia.  Today, he continues to be tangential, disorganized at times, angry, labile mood.  He was eating off his tray versus the plate, dishelved, and irritable.  Reports he will sue this hospital if he is not released.  Wrote a crayon note to the provider with his medications in ABC order written at the top.  He became very angry when talking about his partner.    Past Psychiatric History: schizoaffective disorder, polysubstance abuse  Risk to Self: Suicidal Ideation: No Suicidal Intent: No Is patient at risk for suicide?: No Suicidal Plan?: No Access to Means: No What has been your use of drugs/alcohol within the last 12 months?: Pt  reports marijuana How many times?: 0 Other Self Harm Risks: NA Triggers for Past Attempts: Unknown Intentional Self Injurious Behavior: None Risk to Others: Homicidal Ideation: No Thoughts of Harm to Others: No Current Homicidal Intent: No Current Homicidal Plan: No Access to Homicidal Means: No Identified Victim: NA History of harm to others?: No Assessment of Violence: None Noted Violent Behavior Description: NA Does patient have access to weapons?: No Criminal Charges Pending?: No Does patient have a court date: No Prior Inpatient Therapy: Prior Inpatient Therapy: Yes Prior Therapy Dates: 2017 Prior Therapy Facilty/Provider(s): multiple per pt. Reason for Treatment: substance absue/ psychosis Prior Outpatient Therapy: Prior Outpatient Therapy: Yes Prior Therapy Dates: current Prior Therapy Facilty/Provider(s): [facility] Reason for Treatment: psychosis symptoms Does patient have an ACCT team?: No Does patient have Intensive In-House Services?  : No Does patient have [facility] services? : No Does patient have P4CC services?: No  Past Medical History:  Past Medical History:  Diagnosis Date  . Depression   . Gender identity disorder   . Liver disorder   . PTSD (post-traumatic stress disorder)   . Schizoaffective disorder ([place])    History reviewed. No pertinent surgical history. Family History:  Family History  Problem Relation Age of Onset  . Multiple sclerosis Mother   . Mental illness Neg Hx    Family Psychiatric  History: none Social History:  History  Alcohol Use  . Yes    Comment: occasional     History  Drug Use  . Types: Cocaine, Methamphetamines, Marijuana    Comment: last useof methamphetamines and marijuana yesterday    Social History   Social History  . Marital status: Single  Spouse name: N/A  . Number of children: N/A  . Years of education: N/A   Social History Main Topics  . Smoking status: Current Every Day Smoker    Packs/day: 1.00     Types: Cigarettes  . Smokeless tobacco: Never Used  . Alcohol use Yes     Comment: occasional  . Drug use:     Types: Cocaine, Methamphetamines, Marijuana     Comment: last useof methamphetamines and marijuana yesterday  . Sexual activity: Not Currently   Other Topics Concern  . None   Social History Narrative  . None   Additional Social History:    Allergies:   Allergies  Allergen Reactions  . Penicillins Swelling and Rash    Has patient had a PCN reaction causing immediate rash, facial/tongue/throat swelling, SOB or lightheadedness with hypotension: yes Has patient had a PCN reaction causing severe rash involving mucus membranes or skin necrosis: no Has patient had a PCN reaction that required hospitalization: yes Has patient had a PCN reaction occurring within the last 10 years: no If all of the above answers are "NO", then may proceed with Cephalosporin use.   . Sulfa Antibiotics Rash    Labs:  Results for orders placed or performed during the hospital encounter of 10/30/15 (from the past 48 hour(s))  Rapid urine drug screen (hospital performed)     Status: Abnormal   Collection Time: 10/30/15  3:47 PM  Result Value Ref Range   Opiates NONE DETECTED NONE DETECTED   Cocaine NONE DETECTED NONE DETECTED   Benzodiazepines NONE DETECTED NONE DETECTED   Amphetamines NONE DETECTED NONE DETECTED   Tetrahydrocannabinol POSITIVE (A) NONE DETECTED   Barbiturates NONE DETECTED NONE DETECTED    Comment:        DRUG SCREEN FOR MEDICAL PURPOSES ONLY.  IF CONFIRMATION IS NEEDED FOR ANY PURPOSE, NOTIFY LAB WITHIN 5 DAYS.        LOWEST DETECTABLE LIMITS FOR URINE DRUG SCREEN Drug Class       Cutoff (ng/mL) Amphetamine      1000 Barbiturate      200 Benzodiazepine   371 Tricyclics       062 Opiates          300 Cocaine          300 THC              50   Comprehensive metabolic panel     Status: Abnormal   Collection Time: 10/30/15  4:02 PM  Result Value Ref Range    Sodium 138 135 - 145 mmol/L   Potassium 3.8 3.5 - 5.1 mmol/L   Chloride 106 101 - 111 mmol/L   CO2 24 22 - 32 mmol/L   Glucose, Bld 106 (H) 65 - 99 mg/dL   BUN 9 6 - 20 mg/dL   Creatinine, Ser 0.80 0.61 - 1.24 mg/dL   Calcium 9.6 8.9 - 10.3 mg/dL   Total Protein 7.8 6.5 - 8.1 g/dL   Albumin 4.7 3.5 - 5.0 g/dL   AST 33 15 - 41 U/L   ALT 26 17 - 63 U/L   Alkaline Phosphatase 41 38 - 126 U/L   Total Bilirubin 0.5 0.3 - 1.2 mg/dL   GFR calc non Af Amer >60 >60 mL/min   GFR calc Af Amer >60 >60 mL/min    Comment: (NOTE) The eGFR has been calculated using the CKD EPI equation. This calculation has not been validated in all clinical situations. eGFR's persistently <60 mL/min  signify possible Chronic Kidney Disease.    Anion gap 8 5 - 15  Ethanol     Status: None   Collection Time: 10/30/15  4:02 PM  Result Value Ref Range   Alcohol, Ethyl (B) <5 <5 mg/dL    Comment:        LOWEST DETECTABLE LIMIT FOR SERUM ALCOHOL IS 5 mg/dL FOR MEDICAL PURPOSES ONLY   Salicylate level     Status: None   Collection Time: 10/30/15  4:02 PM  Result Value Ref Range   Salicylate Lvl <0.1 2.8 - 30.0 mg/dL  Acetaminophen level     Status: Abnormal   Collection Time: 10/30/15  4:02 PM  Result Value Ref Range   Acetaminophen (Tylenol), Serum <10 (L) 10 - 30 ug/mL    Comment:        THERAPEUTIC CONCENTRATIONS VARY SIGNIFICANTLY. A RANGE OF 10-30 ug/mL MAY BE AN EFFECTIVE CONCENTRATION FOR MANY PATIENTS. HOWEVER, SOME ARE BEST TREATED AT CONCENTRATIONS OUTSIDE THIS RANGE. ACETAMINOPHEN CONCENTRATIONS >150 ug/mL AT 4 HOURS AFTER INGESTION AND >50 ug/mL AT 12 HOURS AFTER INGESTION ARE OFTEN ASSOCIATED WITH TOXIC REACTIONS.   cbc     Status: None   Collection Time: 10/30/15  4:02 PM  Result Value Ref Range   WBC 10.3 4.0 - 10.5 K/uL   RBC 4.60 4.22 - 5.81 MIL/uL   Hemoglobin 13.6 13.0 - 17.0 g/dL   HCT 39.9 39.0 - 52.0 %   MCV 86.7 78.0 - 100.0 fL   MCH 29.6 26.0 - 34.0 pg   MCHC 34.1 30.0  - 36.0 g/dL   RDW 14.3 11.5 - 15.5 %   Platelets 207 150 - 400 K/uL    Current Facility-Administered Medications  Medication Dose Route Frequency Provider Last Rate Last Dose  . alum & mag hydroxide-simeth (MAALOX/MYLANTA) 200-200-20 MG/5ML suspension 30 mL  30 mL Oral PRN Tatyana Kirichenko, PA-C      . atorvastatin (LIPITOR) tablet 20 mg  20 mg Oral Daily Tatyana Kirichenko, PA-C   20 mg at 11/01/15 0932  . divalproex (DEPAKOTE ER) 24 hr tablet 500 mg  500 mg Oral BID Patrecia Pour, NP      . divalproex (DEPAKOTE) DR tablet 1,000 mg  1,000 mg Oral Once Patrecia Pour, NP      . emtricitabine-tenofovir AF (DESCOVY) 200-25 MG per tablet 1 tablet  1 tablet Oral Daily Tatyana Kirichenko, PA-C   1 tablet at 11/01/15 0932  . finasteride (PROSCAR) tablet 10 mg  10 mg Oral Daily Tatyana Kirichenko, PA-C   10 mg at 11/01/15 0934  . ibuprofen (ADVIL,MOTRIN) tablet 600 mg  600 mg Oral Q8H PRN Patrecia Pour, NP   600 mg at 11/01/15 0955  . LORazepam (ATIVAN) tablet 1 mg  1 mg Oral Q8H PRN Tatyana Kirichenko, PA-C   1 mg at 10/31/15 2103  . modafinil (PROVIGIL) tablet 100 mg  100 mg Oral Daily Patrecia Pour, NP      . nicotine polacrilex (NICORETTE) gum 2 mg  2 mg Oral PRN Serena Colonel, RN   2 mg at 11/01/15 0931  . OLANZapine zydis (ZYPREXA) disintegrating tablet 10 mg  10 mg Oral BID Patrecia Pour, NP      . ondansetron [facility]) tablet 4 mg  4 mg Oral Q8H PRN Tatyana Kirichenko, PA-C      . PARoxetine (PAXIL) tablet 20 mg  20 mg Oral Daily Tatyana Kirichenko, PA-C   20 mg at 11/01/15 0934  . trihexyphenidyl (ARTANE)  tablet 2 mg  2 mg Oral BID WC Tatyana Kirichenko, PA-C   2 mg at 11/01/15 8546   Current Outpatient Prescriptions  Medication Sig Dispense Refill  . acetaminophen (TYLENOL) 325 MG tablet Take 2 tablets (650 mg total) by mouth every 4 (four) hours as needed for mild pain or fever (T > 38.3Celsius).    Marland Kitchen alum & mag hydroxide-simeth (MAALOX/MYLANTA) 200-200-20 MG/5ML suspension Take 30  mLs by mouth as needed for indigestion or heartburn. 355 mL 0  . aspirin 81 MG EC tablet Take 1 tablet (81 mg total) by mouth daily. For heart health 1 tablet 0  . atorvastatin (LIPITOR) 20 MG tablet Take 1 tablet (20 mg total) by mouth daily. For high Cholesterol 1 tablet 0  . emtricitabine-tenofovir (TRUVADA) 200-300 MG tablet Take 1 tablet by mouth daily. For pre-exposure to HIV    . estradiol (ALORA) 0.05 MG/24HR patch Place 1 patch (0.05 mg total) onto the skin daily. For male hormone replacement 8 patch 12  . finasteride (PROSCAR) 5 MG tablet Take 2 tablets (10 mg total) by mouth daily. For enlarged prostate    . ibuprofen (ADVIL,MOTRIN) 600 MG tablet Take 1 tablet (600 mg total) by mouth every 8 (eight) hours as needed (Temp > 38.3Celsius). For pain 1 tablet 0  . LORazepam (ATIVAN) 1 MG tablet Take 1 tablet (1 mg total) by mouth every 8 (eight) hours as needed for anxiety (agitation). 30 tablet 0  . modafinil (PROVIGIL) 100 MG tablet Take 1 tablet (100 mg total) by mouth daily. For excessive sleepiness 30 tablet 0  . nicotine polacrilex (NICORETTE) 2 MG gum Take 1 each (2 mg total) by mouth as needed for smoking cessation. 100 tablet 0  . ondansetron (ZOFRAN) 4 MG tablet Take 1 tablet (4 mg total) by mouth every 8 (eight) hours as needed for nausea or vomiting. 20 tablet 0  . PARoxetine (PAXIL) 20 MG tablet Take 1 tablet (20 mg total) by mouth daily. For depression    . QUEtiapine (SEROQUEL) 100 MG tablet Take 1 tablet (100 mg total) by mouth at bedtime. For mood control    . triamcinolone cream (KENALOG) 0.1 % Apply topically 2 (two) times daily. For rashes 30 g 0  . trihexyphenidyl (ARTANE) 2 MG tablet Take 1 tablet (2 mg total) by mouth 2 (two) times daily with a meal. For prevention of drug induced tremors    . diphenhydrAMINE (BENADRYL) 50 MG/ML injection Inject 1 mL (50 mg total) into the muscle once. For agitation (Patient not taking: Reported on 10/30/2015)  0  . lamoTRIgine (LAMICTAL)  25 MG tablet Take 1 tablet (25 mg total) by mouth 2 (two) times daily. For mood stabilization (Patient not taking: Reported on 10/01/2015)    . ziprasidone (GEODON) injection Inject 20 mg into the muscle once. For agitation (Patient not taking: Reported on 10/01/2015) 1 each     Musculoskeletal: Strength & Muscle Tone: within normal limits Gait & Station: normal Patient leans: N/A  Psychiatric Specialty Exam: Physical Exam  Constitutional: He is oriented to person, place, and time. He appears well-developed and well-nourished.  HENT:  Head: Normocephalic.  Neck: Normal range of motion.  Respiratory: Effort normal.  Musculoskeletal: Normal range of motion.  Neurological: He is alert and oriented to person, place, and time.  Skin: Skin is warm and dry.  Psychiatric: His mood appears anxious. His affect is angry, blunt and labile. His speech is rapid and/or pressured and tangential. He is agitated and actively hallucinating.  Thought content is paranoid. Cognition and memory are normal. He expresses impulsivity and inappropriate judgment.    Review of Systems  Constitutional: Negative.   HENT: Negative.   Eyes: Negative.   Respiratory: Negative.   Cardiovascular: Negative.   Gastrointestinal: Negative.   Genitourinary: Negative.   Musculoskeletal: Negative.   Skin: Negative.   Neurological: Negative.   Endo/Heme/Allergies: Negative.   Psychiatric/Behavioral: Positive for hallucinations. The patient is nervous/anxious.     Blood pressure (!) 105/54, pulse (!) 54, temperature 97.8 F (36.6 C), temperature source Oral, resp. rate 16, SpO2 100 %.There is no height or weight on file to calculate BMI.  General Appearance: Disheveled  Eye Contact:  Fair  Speech:  Pressured  Volume:  Increased  Mood:  Angry, Anxious and Irritable  Affect:  Blunt  Thought Process:  Disorganized and Descriptions of Associations: Tangential  Orientation:  Full (Time, Place, and Person)  Thought Content:   Delusions and Hallucinations: Auditory  Suicidal Thoughts:  No  Homicidal Thoughts:  Yes.  without intent/plan  Memory:  Immediate;   Fair Recent;   Fair Remote;   Fair  Judgement:  Impaired  Insight:  Lacking  Psychomotor Activity:  Increased  Concentration:  Concentration: Fair and Attention Span: Fair  Recall:  [employer] of Knowledge:  Fair  Language:  Good  Akathisia:  No  Handed:  Right  AIMS (if indicated):     Assets:  Housing Physical Health Resilience  ADL's:  Intact  Cognition:  Impaired,  Mild  Sleep:        Treatment Plan Summary: Daily contact with patient to assess and evaluate symptoms and progress in treatment, Medication management and Plan schizoaffective disorder, bipolar type:  -Crisis stabilization -Medication management: Continued his medical medications, started Depakote 1000 mg one time and then 500 mg BID for mood stabilization, Zyprexa 10 mg BID for psychosis.  Continued Paxil 20 mg daily for depression and Artane 2 mg BID for EPS -Individual counseling  Disposition: Recommend psychiatric Inpatient admission when medically cleared.  Waylan Boga, NP 11/01/2015 10:31 AM   Patient seen face to face for this evaluation, case discussed with treatment team and physician extender and formulated treatment plan. Reviewed the information documented and agree with the treatment plan.   Tinisha Etzkorn 11/04/2015 10:46 AM

## 2015-11-01 NOTE — Tx Team (Addendum)
Initial Interdisciplinary Treatment Plan   PATIENT STRESSORS: Health problems Marital or family conflict Medication change or noncompliance Substance abuse   PATIENT STRENGTHS: Average or above average intelligence Financial means General fund of knowledge   PROBLEM LIST: Problem List/Patient Goals Date to be addressed Date deferred Reason deferred Estimated date of resolution  Psychosis 11/01/15     Aggression 11/01/15     "I've been ready to leave since I got here" 11/01/15     "I don't want to wait and want my ride available when I leave because I'm fine." 11/01/15                                    DISCHARGE CRITERIA:  Improved stabilization in mood, thinking, and/or behavior Verbal commitment to aftercare and medication compliance  PRELIMINARY DISCHARGE PLAN: Outpatient therapy Medication management  PATIENT/FAMIILY INVOLVEMENT: This treatment plan has been presented to and reviewed with the patient, Joshua Davila.  The patient and family have been given the opportunity to ask questions and make suggestions.  Norm ParcelHeather V Reddick 11/01/2015, 4:35 PM

## 2015-11-02 DIAGNOSIS — F4310 Post-traumatic stress disorder, unspecified: Secondary | ICD-10-CM

## 2015-11-02 DIAGNOSIS — F250 Schizoaffective disorder, bipolar type: Principal | ICD-10-CM

## 2015-11-02 DIAGNOSIS — F1910 Other psychoactive substance abuse, uncomplicated: Secondary | ICD-10-CM

## 2015-11-02 DIAGNOSIS — F640 Transsexualism: Secondary | ICD-10-CM

## 2015-11-02 MED ORDER — TRAZODONE HCL 100 MG PO TABS
100.0000 mg | ORAL_TABLET | Freq: Every day | ORAL | Status: DC
  Administered 2015-11-02: 100 mg via ORAL
  Filled 2015-11-02 (×3): qty 1

## 2015-11-02 NOTE — [facility] Suicide Risk Assessment (Signed)
[facility]   Nursing information obtained from:  Patient Demographic factors:  Male, Caucasian, Cardell PeachGay, lesbian, or bisexual orientation, Low socioeconomic status, Unemployed Current Mental Status:  Psychotic with mood lability Loss Factors:  Loss of significant relationship, Decline in physical health, Legal issues, Financial problems / change in socioeconomic status Historical Factors:  Impulsivity Risk Reduction Factors:  NA  Total Time spent with patient: 1 hour Principal Problem: Schizoaffective disorder, bipolar type (HCC) Diagnosis:   Patient Active Problem List   Diagnosis Date Noted  . Polysubstance abuse [F19.10]   . Schizoaffective disorder, bipolar type ([facility]) [F25.0] 10/02/2015  . Involuntary commitment [Z04.6] 10/02/2015  . Cannabis use disorder, moderate, dependence ([facility]) [F12.20] 09/16/2015  . Opioid use disorder, moderate, dependence ([facility]) [F11.20] 09/16/2015  . 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 Benzodiazepine dependence ([facility]) [F13.20] 10/04/2013  . PTSD (post-traumatic stress disorder) [F43.10] 10/03/2013  . Polysubstance dependence ([facility]) [F19.20] 10/02/2013   Subjective Data: Pt was IVC by husband for punching holes in the wall. He states that his husband is cheating on him, using drugs and not taking pt to doctor appointments. Pt is paranoid and states that he is going to "hog" tie anyone who is using drugs in his home. Pt admits to smoking THC daily. He also smokes and then eats his cigs. Denies SI/HI. States he is not taking any meds but the "meds Dr. Dub MikesLugo had me on worked good".   Continued Clinical Symptoms:  Alcohol Use Disorder Identification Test Final Score (AUDIT): 0 The  "Alcohol Use Disorders Identification Test", Guidelines for Use in Primary Care, Second Edition.  World Science writerHealth Organization [facility](WHO). Score between 0-7:  no or low risk or alcohol related problems. Score between 8-15:  moderate risk of alcohol related problems. Score between 16-19:  high risk of alcohol related problems. Score 20 or above:  warrants further diagnostic evaluation for alcohol dependence and treatment.   CLINICAL FACTORS:   Bipolar Disorder:   Mixed State Alcohol/Substance Abuse/Dependencies Schizophrenia:   Less than 513 years old Paranoid or undifferentiated type More than one psychiatric diagnosis Currently Psychotic   Musculoskeletal: Strength & Muscle Tone: within normal limits Gait & Station: normal Patient leans: straight  Psychiatric Specialty Exam: Physical Exam  Review of Systems  Psychiatric/Behavioral: Positive for depression and substance abuse. Negative for hallucinations and suicidal ideas. The patient has insomnia. The patient is not nervous/anxious.     Blood pressure 117/75, pulse 87, temperature 98.6 F (37 C), temperature source Oral, resp. rate 18, height 6' (1.829 m), weight 84.4 kg (186 lb), SpO2 100 %.Body mass index is 25.23 kg/m.  General Appearance: Disheveled  Eye Contact:  Fair  Speech:  pushed  Volume:  Normal  Mood:  Depressed and Irritable  Affect:  Labile  Thought Process:  Irrelevant and Descriptions of Associations: Circumstantial  Orientation:  Full (Time, Place, and Person)  Thought Content:  Delusions, Hallucinations: Auditory Visual, Paranoid Ideation and Rumination  Suicidal Thoughts:  No  Homicidal Thoughts:  No  Memory:  Immediate;   Fair Recent;   Fair Remote;   Fair  Judgement:  Impaired  Insight:  Lacking  Psychomotor Activity:  Increased and Restlessness  Concentration:  Concentration: Poor  Recall:  [employer]  Fund of Knowledge:  Fair  Language:  Fair  Akathisia:  No  Handed:  Right  AIMS (if indicated):      Assets:  Housing  ADL's:  Intact  Cognition:  Impaired,  Mild  Sleep:  Number of Hours: 4.75      COGNITIVE FEATURES THAT CONTRIBUTE TO RISK:  Closed-mindedness, Loss of executive function, Polarized thinking and Thought constriction (tunnel vision)    SUICIDE RISK:   Moderate:  Frequent suicidal ideation with limited intensity, and duration, some specificity in terms of plans, no associated intent, good self-control, limited dysphoria/symptomatology, some risk factors present, and identifiable protective factors, including available and accessible social support.   PLAN OF CARE: Admit to inpatient psych unit.  See H&P  I certify that inpatient services furnished can reasonably be expected to improve the patient's condition.  Oletta Darter, MD 11/02/2015, 8:26 AM

## 2015-11-02 NOTE — Progress Notes (Signed)
DATA: Patient came to the medication window early this AM, banging hard and yelling, "I Want my meds". Patient then became irrate with the nurse, because she was not answering his questions fast enough and had to walk to the computer to clarify information. Patient yelled, "Stop walking away from me. You need to give me my meds now. Stop lying to me and stop trying to poison me." Patient refused redirection and continued to scream at nurse until the medication window was closed.  Later on in the shift, patient came to the nurse, sobbing, because, "My dog is at my house and she wants to go everywhere I go and she can';'t be here with me. Do you think I can have visitation with her on the patio?" Patient was able to accept emotional support and redirection at that time. Shortly after this patient noted to be doing exercises (pushups) in the hallway and also dancing, "The Caprice BeaverWaltz" with a male peer. Patient was again able to be redirected. He was noted interacting with another patient and instigating an argument. At end of shift patient came to nurses station and asked what his night time medications would be. He became very angry when he was told his seroquel was D/C. After the nurses speaking with the NP and explaining the NP's rational to the patient, Patient was able to state that he would, "Try the Ativan for sleep tonight, but idf it doesn't work, they will have to call the on-call doctor".  Patient has been intrusive and demanding through out this shift. Patient's goal today is: " Everything positive".  ACTION: Education provided on medication, indications and side effect. Q 15 minute checks done for safety. RESPONSE: Safety on the unit maintained through 15 minute checks.  Medications taken as prescribed. Attended groups.

## 2015-11-02 NOTE — Progress Notes (Signed)
Adult Psychoeducational Group Note  Date:  11/02/2015 Time:  9:32 PM  Group Topic/Focus:  Wrap-Up Group:   The focus of this group is to help patients review their daily goal of treatment and discuss progress on daily workbooks.   Participation Level:  Active  Participation Quality:  Appropriate  Affect:  Appropriate  Cognitive:  Appropriate  Insight: Appropriate  Engagement in Group:  Engaged  Modes of Intervention:  Discussion  Additional Comments: The patient expressed that he attend all groups.The patient also said that he rates today a 8 which is good. Octavio Mannshigpen, Jashawn Floyd Lee 11/02/2015, 9:32 PM

## 2015-11-02 NOTE — Progress Notes (Signed)
D: Joshua Davila' mood has been very labile. He goes from being pleasant to angry and agitated. He rates anxiety 9/10. It was reported that his room was in disarray at the start of the shift. His nightstand had been moved near the bathroom. His bed had been moved along side the ac unit and his shower curtain had been torn down. When asked about his room he became agitated and angry stating "just give me my medicines, I am not a child and I don't need to explain anything to anybody".  Denies SI/HI/AVH at this time. Contracts for safety.  A: Encouragement and support given. Q15 minute room checks for patient safety. Medications administered as prescribed.  R: Continue to monitor for patient safety and medication effectiveness.

## 2015-11-02 NOTE — [facility] Group Notes (Signed)
[facility] Group Notes:  (Nursing/MHT/Case Management/Adjunct)  Date:  11/02/2015  Time:  10:10 AM  Type of Therapy:  Nurse Education  Participation Level:  Active  Participation Quality:  Intrusive  Affect:  Irritable  Cognitive:  Alert and Disorganized  Insight:  Lacking  Engagement in Group:  Off Topic  Modes of Intervention:  Problem-solving  Summary of Progress/Problems: Patient was somewhat domineering in group. He was started an argument with another patient and gave a name during introductions, not his own, "To make everyone smile". Somewhat off topic, and resisted redirection.  Almira Barenny G Charlette Hennings 11/02/2015, 10:10 AM

## 2015-11-02 NOTE — Plan of Care (Signed)
Problem: Education: Goal: Will be free of psychotic symptoms Outcome: Progressing Patient has been very labile this shift. He is more redirectable as the shift moves on.

## 2015-11-02 NOTE — Progress Notes (Signed)
D: Pt was restless, illogical, concreate and disorganized-Pt complained of not able to sleep while admitting to "fighting the sleep." Pt endorses moderate anxiety and depression. Pt is labile-during a single conversation with the nurse, Pt went from being very happy and how lucky he was to be here to crying over missing his dogs and then becoming irritable and angry for not getting all his home medications. Pt also endorses AH; states, "I hear some sound here and there but they aren't saying much." Pt at the time of assessment denies depression, anxiety, pain, SI or HI. Pt remained very hyper-verbal. A: Medications offered as prescribed.  Support, encouragement, and safe environment provided.  15-minute safety checks continue.  R: Pt was med compliant- can be med seeking.  Pt attended wrap-up group. Safety checks continue.

## 2015-11-02 NOTE — [facility] Group Notes (Signed)
[facility] Group Notes: (Clinical Social Work)   11/02/2015      Type of Therapy:  Group Therapy   Participation Level:  Did Not Attend despite MHT prompting - he was in the room at the beginning of group, but kept coming in and out, would not settle down and become part of the discussion.   Joshua MantleMareida Grossman-Orr, LCSW 11/02/2015, 4:12 PM

## 2015-11-02 NOTE — H&P (Signed)
Psychiatric Admission Assessment Adult  Patient Identification: Joshua Davila  MRN:  [id number]  Date of Evaluation:  11/02/2015  Chief Complaint: Pt states " My spouse brought me here but he is cheating on me."   Principal Diagnosis: Schizoaffective Disorder, bipolar type   Diagnosis:   Patient Active Problem List   Diagnosis Date Noted  . Polysubstance abuse [F19.10]   . Schizoaffective disorder, bipolar type (HCC) [F25.0] 10/02/2015  . Involuntary commitment [Z04.6] 10/02/2015  . Cannabis use disorder, moderate, dependence (HCC) [F12.20] 09/16/2015  . Opioid use disorder, moderate, dependence (HCC) [F11.20] 09/16/2015  . 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 Benzodiazepine dependence (HCC) [F13.20] 10/04/2013  . PTSD (post-traumatic stress disorder) [F43.10] 10/03/2013  . Polysubstance dependence [facility]) [F19.20] 10/02/2013   History of Present Illness: Joshua Davila is a 35 year old Caucasian male, who is on SSD , has a hx of PTSD, gender dysphoria as well as polysubstance abuse , presented IVCed for disorganized behavior and suicidal thoughts.   Per initial notes in EHR : "  Joshua Davila is an 35 y.o. male. Pt denies SI/HI. Pt denies AVH but appeared to speaking to someone who was not present. Pt has been diagnosed with Schizophrenia. Pt's speech was tangential. Pt had difficulty answering the questions during the assessment. The Pt discussed constant conflict with his husband. The Pt stated that he was angry because his spouse is cheating on him. Pt began punching holes in the wall. Pt's spouse then called [employer]. Pt was IVCd by his spouse. Pt has outpatient services with [facility]. Pt is refusing to take his medications and attend  outpatient services. Pt has been hospitalized multiple times.   Per assessment today 11/02/2015:  Patient has been diagnosed with Schizophrenia, PTSD, and Gender Dysphoria." Patient seen and chart reviewed today. Patient with pressured speech , tangential thought process, appears disorganized , flipping from topic to topic. Patient endorses paranoid ideation, appears delusional that his spouse is out to get him. 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 Patient reports abusing cannabis on a regular basis since the past several years. Patient has a hx of cocaine, opioids and BZD abuse.Patient has had several admissions to [facility] for similar presentation with the most recent being June 2017.  It appears form notes that patient has not been compliant with his psychotropic medications. Patient presents as very manic during assessment and is quite intrusive. The patient proceeded to take this writer's clipboard to write down his medications and comments that were noted to be illogical.   Patient with hx of being on anti- HIV medications for preexposure prophylaxis - reports he needs to be retested since he had been noncompliant for a long time.   Associated Signs/Symptoms:  Depression Symptoms:  depressed mood, insomnia, anxiety, loss of energy/fatigue, weight loss,  (Hypo) Manic Symptoms:  Impulsivity,  Anxiety Symptoms:  Excessive Worry,  Psychotic Symptoms:  Delusions, Paranoia,  PTSD Symptoms: [employer], operational. Re-experiencing:  Flashbacks Nightmares  Total Time spent with patient: 1 hour  Past Psychiatric History: Pt with hx of PTSD as well as mood do related to polysubstance abuse .Pt has had several admissions at [facility] ( 10/02/2013, 06/29/2015)  [facility] Pt reports suicide attempt in the past by OD when spouse was diagnosed with HIV.  Is the patient at risk to self? Yes.    Has the  patient been a risk to self in the past 6 months? No.  Has the patient been a risk to self within the distant past? Yes.    Is the patient a risk to others? No.  Has the patient been a risk to others in the past 6 months? No.  Has the patient been a risk to others within the distant past? No.   Prior Inpatient Therapy: see above Prior Outpatient Therapy: see above   Alcohol Screening: Denies Substance Abuse History in the last 12 months:  Yes.  - see above  Consequences of Substance Abuse: Medical Consequences:  Liver damage, Possible death by overdose Legal Consequences:  Arrests, jail time, Loss of driving privilege. Family Consequences:  Family discord, divorce and or separation.  Previous Psychotropic Medications: Yes - abilify , tegretol.  Psychological Evaluations: no   Past Medical History:  Past Medical History:  Diagnosis Date  . Depression   . Gender identity disorder   . Liver disorder   . PTSD (post-traumatic stress disorder)   . Schizoaffective disorder (HCC)    History reviewed. No pertinent surgical history.  Family History:  Family History  Problem Relation Age of Onset  . Multiple sclerosis Mother   . Mental illness Neg Hx    Family Psychiatric  History: Denies any familial hx of mental health issues of substance use/abuse   Tobacco Screening: Smokes about a pack of cigarettes daily  Social History: Pt is married , on SSD, used to serve in [employer] , honorouably discharged .Lives in [place]. History  Alcohol Use  . Yes    Comment: occasional     History  Drug Use  . Types: Cocaine, Methamphetamines, Marijuana    Comment: last useof methamphetamines and marijuana yesterday    Additional Social History:  Allergies:   Allergies  Allergen Reactions  . Penicillins Swelling and Rash    Has patient had a PCN reaction causing immediate rash, facial/tongue/throat swelling, SOB or lightheadedness with hypotension: yes Has patient had a PCN reaction  causing severe rash involving mucus membranes or skin necrosis: no Has patient had a PCN reaction that required hospitalization: yes Has patient had a PCN reaction occurring within the last 10 years: no If all of the above answers are "NO", then may proceed with Cephalosporin use.   . Sulfa Antibiotics Rash   Lab Results:  No results found for this or any previous visit (from the past 48 hour(s)). Blood Alcohol level:  Lab Results  Component Value Date   ETH <5 10/30/2015   ETH <5 10/01/2015   Metabolic Disorder Labs:  Lab Results  Component Value Date   HGBA1C 5.1 09/17/2015   MPG 100 09/17/2015   No results found for: PROLACTIN Lab Results  Component Value Date   CHOL 191 09/17/2015   TRIG 196 (H) 09/17/2015   HDL 27 (L) 09/17/2015   CHOLHDL 7.1 09/17/2015   VLDL 39 09/17/2015   LDLCALC 125 (H) 09/17/2015    Current Medications: Current Facility-Administered Medications  Medication Dose Route Frequency Provider Last Rate Last Dose  . acetaminophen (TYLENOL) tablet 650 mg  650 mg Oral Q6H PRN Charm Rings, NP      . alum & mag hydroxide-simeth (MAALOX/MYLANTA) 200-200-20 MG/5ML suspension 30 mL  30 mL Oral PRN Charm Rings, NP      .  atorvastatin (LIPITOR) tablet 20 mg  20 mg Oral Daily Charm Rings, NP   20 mg at 11/02/15 0755  . divalproex (DEPAKOTE ER) 24 hr tablet 500 mg  500 mg Oral BID Charm Rings, NP   500 mg at 11/02/15 0755  . emtricitabine-tenofovir AF (DESCOVY) 200-25 MG per tablet 1 tablet  1 tablet Oral Daily Charm Rings, NP   1 tablet at 11/02/15 0755  . finasteride (PROSCAR) tablet 10 mg  10 mg Oral Daily Charm Rings, NP      . ibuprofen (ADVIL,MOTRIN) tablet 600 mg  600 mg Oral Q8H PRN Charm Rings, NP      . LORazepam (ATIVAN) tablet 1 mg  1 mg Oral Q8H PRN Charm Rings, NP   1 mg at 11/02/15 1138  . magnesium hydroxide (MILK OF MAGNESIA) suspension 30 mL  30 mL Oral Daily PRN Charm Rings, NP      . modafinil (PROVIGIL) tablet 100 mg   100 mg Oral Daily Charm Rings, NP   100 mg at 11/02/15 0810  . nicotine polacrilex (NICORETTE) gum 2 mg  2 mg Oral PRN Charm Rings, NP   2 mg at 11/01/15 2335  . OLANZapine zydis (ZYPREXA) disintegrating tablet 10 mg  10 mg Oral BID Charm Rings, NP   10 mg at 11/02/15 0755  . ondansetron (ZOFRAN) tablet 4 mg  4 mg Oral Q8H PRN Charm Rings, NP      . PARoxetine (PAXIL) tablet 20 mg  20 mg Oral Daily Charm Rings, NP   20 mg at 11/02/15 0755  . traZODone (DESYREL) tablet 100 mg  100 mg Oral QHS Thermon Leyland, NP      . trihexyphenidyl (ARTANE) tablet 2 mg  2 mg Oral BID WC Charm Rings, NP   2 mg at 11/02/15 0755   PTA Medications: Prescriptions Prior to Admission  Medication Sig Dispense Refill Last Dose  . acetaminophen (TYLENOL) 325 MG tablet Take 2 tablets (650 mg total) by mouth every 4 (four) hours as needed for mild pain or fever (T > 38.3Celsius).   UNKNOWN  . alum & mag hydroxide-simeth (MAALOX/MYLANTA) 200-200-20 MG/5ML suspension Take 30 mLs by mouth as needed for indigestion or heartburn. 355 mL 0 UNKNOWN  . aspirin 81 MG EC tablet Take 1 tablet (81 mg total) by mouth daily. For heart health 1 tablet 0 Past Month at Unknown time  . atorvastatin (LIPITOR) 20 MG tablet Take 1 tablet (20 mg total) by mouth daily. For high Cholesterol 1 tablet 0 Past Month at Unknown time  . diphenhydrAMINE (BENADRYL) 50 MG/ML injection Inject 1 mL (50 mg total) into the muscle once. For agitation (Patient not taking: Reported on 10/30/2015)  0 Completed Course at Unknown time  . emtricitabine-tenofovir (TRUVADA) 200-300 MG tablet Take 1 tablet by mouth daily. For pre-exposure to HIV   Past Month at Unknown time  . estradiol (ALORA) 0.05 MG/24HR patch Place 1 patch (0.05 mg total) onto the skin daily. For male hormone replacement 8 patch 12 Past Month at Unknown time  . finasteride (PROSCAR) 5 MG tablet Take 2 tablets (10 mg total) by mouth daily. For enlarged prostate   Past Month at Unknown  time  . ibuprofen (ADVIL,MOTRIN) 600 MG tablet Take 1 tablet (600 mg total) by mouth every 8 (eight) hours as needed (Temp > 38.3Celsius). For pain 1 tablet 0 UNKNOWN  . lamoTRIgine (LAMICTAL) 25 MG tablet Take  1 tablet (25 mg total) by mouth 2 (two) times daily. For mood stabilization (Patient not taking: Reported on 10/01/2015)   Not Taking at Unknown time  . LORazepam (ATIVAN) 1 MG tablet Take 1 tablet (1 mg total) by mouth every 8 (eight) hours as needed for anxiety (agitation). 30 tablet 0 UNKNOWN  . modafinil (PROVIGIL) 100 MG tablet Take 1 tablet (100 mg total) by mouth daily. For excessive sleepiness 30 tablet 0 Past Month at Unknown time  . nicotine polacrilex (NICORETTE) 2 MG gum Take 1 each (2 mg total) by mouth as needed for smoking cessation. 100 tablet 0 Past Month at Unknown time  . ondansetron (ZOFRAN) 4 MG tablet Take 1 tablet (4 mg total) by mouth every 8 (eight) hours as needed for nausea or vomiting. 20 tablet 0 Past Month at Unknown time  . PARoxetine (PAXIL) 20 MG tablet Take 1 tablet (20 mg total) by mouth daily. For depression   Past Month at Unknown time  . QUEtiapine (SEROQUEL) 100 MG tablet Take 1 tablet (100 mg total) by mouth at bedtime. For mood control   Past Month at Unknown time  . triamcinolone cream (KENALOG) 0.1 % Apply topically 2 (two) times daily. For rashes 30 g 0 Past Month at Unknown time  . trihexyphenidyl (ARTANE) 2 MG tablet Take 1 tablet (2 mg total) by mouth 2 (two) times daily with a meal. For prevention of drug induced tremors   Past Month at Unknown time  . ziprasidone (GEODON) injection Inject 20 mg into the muscle once. For agitation (Patient not taking: Reported on 10/01/2015) 1 each  Not Taking at Unknown time   Musculoskeletal: Strength & Muscle Tone: within normal limits Gait & Station: normal Patient leans: N/A  Psychiatric Specialty Exam: Physical Exam  Nursing note and vitals reviewed. Constitutional: He is oriented to person, place, and  time. He appears well-developed.  I concur with PE done in ED.  Respiratory: Effort normal.  Genitourinary:  Genitourinary Comments: Hx. Enlarged prostate  Musculoskeletal: Normal range of motion.  Neurological: He is alert and oriented to person, place, and time.  Skin: Skin is warm and dry.  Psychiatric: His speech is normal and behavior is normal. Thought content normal. His mood appears anxious. His affect is not angry, not blunt, not labile and not inappropriate. Cognition and memory are impaired. He expresses impulsivity. He exhibits a depressed mood.    Review of Systems  HENT: Negative.   Respiratory: Negative.   Genitourinary: Negative.   Endo/Heme/Allergies: Negative.   Psychiatric/Behavioral: Positive for depression and substance abuse (Polysubstance dependence). Negative for hallucinations, memory loss and suicidal ideas. The patient is nervous/anxious (Restless) and has insomnia.   All other systems reviewed and are negative.   Blood pressure 117/75, pulse 87, temperature 98.6 F (37 C), temperature source Oral, resp. rate 18, height 6' (1.829 m), weight 84.4 kg (186 lb), SpO2 100 %.Body mass index is 25.23 kg/m.  General Appearance: Disheveled and Guarded  Eye Solicitor::  Fair  Speech:  Pressured  Volume:  Normal  Mood:  Anxious and Irritable  Affect:  Labile  Thought Process:  Disorganized, Irrelevant and Descriptions of Associations: Loose  Orientation:  Full (Time, Place, and Person)  Thought Content:  Delusions, Paranoid Ideation, Rumination and Tangential  Suicidal Thoughts:  Denies any suicidal thoughts   Homicidal Thoughts:  Denies any homicidal thoughts   Memory:  Immediate;   Good Recent;   Good Remote;   Good  Judgement:  Impaired  Insight:  Fair  Psychomotor Activity:  Restlessness  Concentration:  Poor  Recall:  [employer]Fair  Fund of Knowledge:Fair  Language: Good  Akathisia:  No  Handed:  Right  AIMS (if indicated):     Assets:  Desire for Improvement   ADL's:  Intact  Cognition: Impaired,  Mild  Sleep:  Number of Hours: 4.75   Assessment /Plan : Joshua Davila is a 35 year old Caucasian male, who is on SSD , has a hx of PTSD, gender dysphoria as well as polysubstance abuse , presented IVCed for disorganized behavior and suicidal thoughts. Pt seen as disorganized , pressured and labile. Will restart medications and continue inpatient stay.  Patient will benefit from inpatient treatment and stabilization.  Estimated length of stay is 5-7 days.  Reviewed past medical records,treatment plan.  Will restart Paxil 20 mg po daily for PTSD sx. Will restart Depakote ER 500 mg BID for mood control.  Will start Zyprexa Zydis 10 mg BID for psychosis. Will restart home medications where indicated, including Provigil - previous dose - see MAR. Will continue to monitor vitals ,medication compliance and treatment side effects while patient is here.  Will monitor for medical issues as well as call consult as needed.  Reviewed labs - Chemistry panel, CBC, UDS positive for marijuana CSW will start working on disposition. [facility]. Patient to participate in therapeutic milieu .   Observation Level/Precautions:  15 minute checks  Medications: See above   Psychotherapy: Group sessions     Consultations: As Needed  Discharge Concerns:  Safety, mood stabilization  Estimated LOS: 2-4 days     I certify that inpatient services furnished can reasonably be expected to improve the patient's condition.    Fransisca KaufmannAVIS, Rudransh Bellanca, NP 8/12/20171:10 PM

## 2015-11-03 MED ORDER — OLANZAPINE 5 MG PO TBDP
15.0000 mg | ORAL_TABLET | Freq: Two times a day (BID) | ORAL | Status: DC
  Administered 2015-11-03 – 2015-11-04 (×2): 15 mg via ORAL
  Filled 2015-11-03 (×4): qty 1

## 2015-11-03 MED ORDER — LORAZEPAM 1 MG PO TABS
1.0000 mg | ORAL_TABLET | ORAL | Status: AC | PRN
  Administered 2015-11-07: 1 mg via ORAL
  Filled 2015-11-03 (×2): qty 1

## 2015-11-03 MED ORDER — OLANZAPINE 5 MG PO TBDP
5.0000 mg | ORAL_TABLET | Freq: Three times a day (TID) | ORAL | Status: DC | PRN
  Administered 2015-11-03 – 2015-11-10 (×5): 5 mg via ORAL
  Filled 2015-11-03 (×4): qty 1

## 2015-11-03 MED ORDER — TRAZODONE HCL 50 MG PO TABS
50.0000 mg | ORAL_TABLET | Freq: Every day | ORAL | Status: DC
  Filled 2015-11-03 (×2): qty 1

## 2015-11-03 MED ORDER — ZIPRASIDONE MESYLATE 20 MG IM SOLR: 20.0000 mg | INTRAMUSCULAR | Status: DC | PRN
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
## 2015-11-03 NOTE — [facility] Group Notes (Signed)
[facility] Group Notes:  (Clinical Social Work)  11/03/2015  11:00AM-12:00PM  Summary of Progress/Problems:  The main focus of today's process group was to listen to a variety of genres of music and to identify that different types of music provoke different responses.  The patient then was able to identify personally what was soothing for them, as well as energizing, as well as how patient can personally use this knowledge in sleep habits, with depression, and with other symptoms.  The patient expressed at the beginning of group the overall feeling of "orange" and at the end of group when asked about feelings, stated, "wanna-be's."  He was disengaged at multiple times in group, asking for music to be changed 5 different times.  He then slept in two chairs used as a bed for awhile.  At the end of group he got angry because a song he had requested had not been played, and when CSW agreed to play it for him, he stated he would only do so if it was a capella, and would refuse otherwise.  He did not get to hear his request.  He remained labile.  Type of Therapy:  Music Therapy   Participation Level:  Active  Participation Quality:  Intrusive and Sharing and Distracting and Resistant  Affect:  Flat  Cognitive:  Disorganized  Insight:  Engaged  Engagement in Therapy:  Limited  Modes of Intervention:   Activity, Exploration  Ambrose MantleMareida Grossman-Orr, LCSW 11/03/2015

## 2015-11-03 NOTE — Progress Notes (Signed)
Adult Psychoeducational Group Note  Date:  11/03/2015 Time:  9:39 PM  Group Topic/Focus:  Wrap-Up Group:   The focus of this group is to help patients review their daily goal of treatment and discuss progress on daily workbooks.   Participation Level:  Active  Participation Quality:  Appropriate and Attentive  Affect:  Appropriate  Cognitive:  Appropriate  Insight: Appropriate and Good  Engagement in Group:  Engaged  Modes of Intervention:  Discussion  Additional Comments:  Pt attended group and stated his goal was to "discern better, do what God tells me, be patient, have courage." Pt then went off on a tangent and became hard to follow. Pt was encouraged to make his needs known to staff.  Caswell CorwinOwen, Havah Ammon C 11/03/2015, 9:39 PM

## 2015-11-03 NOTE — B[facility] Counselor (Addendum)
Clinical Social Work Note  CSW met with pt to do Psychosocial Assessment.  He acted aggressive, very forcefully moving wheelchairs in the room which then impeded CSW from getting to desk, moving curtains out of his way and lounging in front of window.  As questions were asked to him, his responses were tangential and off topic, as well as lengthy.  He was irritated when CSW interrupted him to restate question.  Three questions were attempted and he kept becoming more irritated, was raising his voice and becoming accusational toward CSW, so CSW aborted the effort and alerted Med Tech and doctor to the problems.  PSA will be attempted on another day by another CSW.  Selmer Dominion, LCSW 11/03/2015, 12:52 PM

## 2015-11-03 NOTE — Progress Notes (Signed)
[name] is seen [facility] on the 500 hall today..he tolerates this fair. HE has a labile mood and this is evidenced by his calmness one minute and 30 sec later his agitation. A He is able to request prns and will takes these when he asks for them ( ativan). He refuses his gender hormones. R Safety in place.
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
## 2015-11-04 MED ORDER — TRAZODONE HCL 50 MG PO TABS
50.0000 mg | ORAL_TABLET | Freq: Every evening | ORAL | Status: DC | PRN
  Administered 2015-11-04 – 2015-11-05 (×2): 50 mg via ORAL
  Filled 2015-11-04: qty 1

## 2015-11-04 MED ORDER — BENZTROPINE MESYLATE 0.5 MG PO TABS
0.5000 mg | ORAL_TABLET | Freq: Two times a day (BID) | ORAL | Status: DC
  Administered 2015-11-04 – 2015-11-11 (×14): 0.5 mg via ORAL
  Filled 2015-11-04 (×19): qty 1

## 2015-11-04 MED ORDER — DIVALPROEX SODIUM 500 MG PO DR TAB
500.0000 mg | DELAYED_RELEASE_TABLET | Freq: Two times a day (BID) | ORAL | Status: DC
  Administered 2015-11-04 – 2015-11-07 (×6): 500 mg via ORAL
  Filled 2015-11-04 (×8): qty 1

## 2015-11-04 MED ORDER — QUETIAPINE FUMARATE 50 MG PO TABS
50.0000 mg | ORAL_TABLET | Freq: Every day | ORAL | Status: DC
  Administered 2015-11-04: 50 mg via ORAL
  Filled 2015-11-04 (×3): qty 1

## 2015-11-04 MED ORDER — OLANZAPINE 5 MG PO TBDP
5.0000 mg | ORAL_TABLET | Freq: Two times a day (BID) | ORAL | Status: DC
  Administered 2015-11-04: 5 mg via ORAL
  Filled 2015-11-04 (×6): qty 1

## 2015-11-04 NOTE — Progress Notes (Signed)
Recreation Therapy Notes  Date: 11/04/15 Time: 1000 Location: 500 Hall Dayroom  Group Topic: Wellness  Goal Area(s) Addresses:  Patient will define components of whole wellness. Patient will verbalize benefit of whole wellness.  Behavioral Response: Engaged, anxious  Intervention:  ONEOKBeach Ball, Chairs  Activity: Insurance claims handlereated Soccer.  Patients were to sit in chairs arranged in a half circle.  Patients were to kick the ball back and forth to each other and try to score a goal on the LRT.   Education: Wellness, Building control surveyorDischarge Planning.   Education Outcome: Acknowledges education/In group clarification offered/Needs additional education.   Clinical Observations/Feedback: Pt was anxious, engaged and needed redirection.  Pt worked well with his peers.  Pt was bright and stated he liked the activity because it helped him get off some energy and exercise his muscles.     Caroll RancherMarjette Tianah Lonardo, LRT/CTRS        Caroll RancherLindsay, Doyce Saling A 11/04/2015 11:25 AM
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
## 2015-11-04 NOTE — Progress Notes (Signed)
Adult Psychoeducational Group Note  Date:  11/04/2015 Time:  8:21 PM  Group Topic/Focus:  Wrap-Up Group:   The focus of this group is to help patients review their daily goal of treatment and discuss progress on daily workbooks.   Participation Level:  Active  Participation Quality:  Appropriate  Affect:  Appropriate  Cognitive:  Appropriate  Insight: Appropriate  Engagement in Group:  Engaged  Modes of Intervention:  Discussion  Additional Comments: The patient expressed that he attended all groups.The patient also said that his day was good. Octavio Mannshigpen, Eileene Kisling Lee 11/04/2015, 8:21 PM

## 2015-11-04 NOTE — Progress Notes (Signed)
[facility]  11/04/2015 1:46 PM Joshua Davila  MRN:  [id number] Subjective: Pt states " I was brought here by some one , I am not sure why."  Objective: Pt seen and chart reviewed and discussed with nursing staff.  Pt remains intrusive , labile, irritable. Pt continues to remain focussed on his provigil as well as his cogentin which he states " helps his joints.' Pt continues to be pressured and tangential and required redirection during the evaluation. Continue to need support.    Principal Problem: Schizoaffective disorder, bipolar type (HCC) Diagnosis:   Patient Active Problem List   Diagnosis Date Noted  . Polysubstance abuse [F19.10]   . Schizoaffective disorder, bipolar type (HCC) [F25.0] 10/02/2015  . Involuntary commitment [Z04.6] 10/02/2015  . Cannabis use disorder, moderate, dependence (HCC) [F12.20] 09/16/2015  . Opioid use disorder, moderate, dependence (HCC) [F11.20] 09/16/2015  . 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 Benzodiazepine dependence (HCC) [F13.20] 10/04/2013  . PTSD (post-traumatic stress disorder) [F43.10] 10/03/2013  . Polysubstance dependence (HCC) [F19.20] 10/02/2013   Total Time spent with patient: 25 minutes  Past Psychiatric History: see H&P  Past Medical History:  Past Medical History:  Diagnosis Date  . Depression   . Gender identity disorder   . Liver disorder   . PTSD (post-traumatic stress disorder)   . Schizoaffective disorder (HCC)    History reviewed. No pertinent surgical history. Family History:  Family History  Problem Relation Age of Onset  . Multiple sclerosis Mother   . Mental illness Neg Hx    Family Psychiatric  History: see H&P Social History:   History  Alcohol Use  . Yes    Comment: occasional     History  Drug Use  . Types: Cocaine, Methamphetamines, Marijuana    Comment: last useof methamphetamines and marijuana yesterday    Social History   Social History  . Marital status: Single    Spouse name: N/A  . Number of children: N/A  . Years of education: N/A   Social History Main Topics  . Smoking status: Current Every Day Smoker    Packs/day: 1.00    Types: Cigarettes  . Smokeless tobacco: Never Used  . Alcohol use Yes     Comment: occasional  . Drug use:     Types: Cocaine, Methamphetamines, Marijuana     Comment: last useof methamphetamines and marijuana yesterday  . Sexual activity: Not Currently   Other Topics Concern  . None   Social History Narrative  . None   Additional Social History:    Pain Medications: Pt denies Prescriptions: Pt cannot remember Over the Counter: Pt denies History of alcohol / drug use?: Yes Longest period of sobriety (when/how long): 50 days Negative Consequences of Use: Financial, Legal, Personal relationships Withdrawal Symptoms: Seizures, Irritability, Sweats, Tremors Onset of Seizures: Four years ago Date of most recent seizure: Four years ago Name of Substance 1: Marijuana 1 - Age of First Use: unknown 1 - Amount (size/oz): unknown 1 - Frequency: daily 1 - Duration: ongoing 1 - Last Use / Amount: 10/29/15                  Sleep: Poor  Appetite:  Good  Current Medications: Current Facility-Administered Medications  Medication Dose Route Frequency Provider Last Rate Last Dose  .  acetaminophen (TYLENOL) tablet 650 mg  650 mg Oral Q6H PRN Charm RingsJamison Y Lord, NP      . alum & mag hydroxide-simeth (MAALOX/MYLANTA) 200-200-20 MG/5ML suspension 30 mL  30 mL Oral PRN Charm RingsJamison Y Lord, NP      . atorvastatin (LIPITOR) tablet 20 mg  20 mg Oral Daily Charm RingsJamison Y Lord, NP   20 mg at 11/04/15 0801  . benztropine (COGENTIN) tablet 0.5 mg  0.5 mg Oral BID Dyanna Seiter, MD       . divalproex (DEPAKOTE) DR tablet 500 mg  500 mg Oral BID Jomarie LongsSaramma Kaeley Vinje, MD      . emtricitabine-tenofovir AF (DESCOVY) 200-25 MG per tablet 1 tablet  1 tablet Oral Daily Charm RingsJamison Y Lord, NP   1 tablet at 11/04/15 0801  . finasteride (PROSCAR) tablet 10 mg  10 mg Oral Daily Charm RingsJamison Y Lord, NP      . ibuprofen (ADVIL,MOTRIN) tablet 600 mg  600 mg Oral Q8H PRN Charm RingsJamison Y Lord, NP   600 mg at 11/04/15 0811  . LORazepam (ATIVAN) tablet 1 mg  1 mg Oral Q8H PRN Charm RingsJamison Y Lord, NP   1 mg at 11/03/15 1320  . magnesium hydroxide (MILK OF MAGNESIA) suspension 30 mL  30 mL Oral Daily PRN Charm RingsJamison Y Lord, NP      . modafinil (PROVIGIL) tablet 100 mg  100 mg Oral Daily Charm RingsJamison Y Lord, NP   100 mg at 11/04/15 0806  . nicotine polacrilex (NICORETTE) gum 2 mg  2 mg Oral PRN Charm RingsJamison Y Lord, NP   2 mg at 11/04/15 1127  . OLANZapine zydis (ZYPREXA) disintegrating tablet 5 mg  5 mg Oral Q8H PRN Oletta DarterSalina Agarwal, MD   5 mg at 11/03/15 2037   And  . ziprasidone (GEODON) injection 20 mg  20 mg Intramuscular PRN Oletta DarterSalina Agarwal, MD      . OLANZapine zydis (ZYPREXA) disintegrating tablet 5 mg  5 mg Oral BID Jomarie LongsSaramma Daily Crate, MD      . ondansetron (ZOFRAN) tablet 4 mg  4 mg Oral Q8H PRN Charm RingsJamison Y Lord, NP      . PARoxetine (PAXIL) tablet 20 mg  20 mg Oral Daily Charm RingsJamison Y Lord, NP   20 mg at 11/04/15 0803  . QUEtiapine (SEROQUEL) tablet 50 mg  50 mg Oral QHS Astria Jordahl, MD      . traZODone (DESYREL) tablet 50 mg  50 mg Oral QHS PRN Jomarie LongsSaramma Orlyn Odonoghue, MD        Lab Results: No results found for this or any previous visit (from the past 48 hour(s)).  Blood Alcohol level:  Lab Results  Component Value Date   ETH <5 10/30/2015   ETH <5 10/01/2015    Metabolic Disorder Labs: Lab Results  Component Value Date   HGBA1C 5.1 09/17/2015   MPG 100 09/17/2015   No results found for: PROLACTIN Lab Results  Component Value Date   CHOL 191 09/17/2015   TRIG 196 (H) 09/17/2015   HDL 27 (L) 09/17/2015   CHOLHDL 7.1  09/17/2015   VLDL 39 09/17/2015   LDLCALC 125 (H) 09/17/2015    Physical Findings: AIMS: Facial and Oral Movements Muscles of Facial Expression: None, normal Lips and Perioral Area: None, normal Jaw: None, normal Tongue: None, normal,Extremity Movements Upper (arms, wrists, hands, fingers): None, normal Lower (legs, knees, ankles, toes): None, normal, Trunk Movements Neck, shoulders, hips: None, normal, Overall Severity Severity of abnormal movements (highest score from questions above): None, normal Incapacitation due to abnormal  movements: None, normal Patient's awareness of abnormal movements (rate only patient's report): No Awareness, Dental Status Current problems with teeth and/or dentures?: No Does patient usually wear dentures?: No  CIWA:    COWS:     Musculoskeletal: Strength & Muscle Tone: within normal limits Gait & Station: normal Patient leans: N/A  Psychiatric Specialty Exam: Physical Exam  Review of Systems  Musculoskeletal: Positive for back pain.    Blood pressure 98/64, pulse 87, temperature 99.3 F (37.4 C), temperature source Oral, resp. rate 18, height 6' (1.829 m), weight 84.4 kg (186 lb), SpO2 100 %.Body mass index is 25.23 kg/m.  General Appearance: Disheveled and Guarded  Eye Contact:  Good  Speech:  Pressured  Volume:  Normal  Mood:  Anxious and Irritable  Affect:  Labile  Thought Process:  Disorganized, Irrelevant and Descriptions of Associations: Loose  Orientation:  Full (Time, Place, and Person)  Thought Content:  Delusions, Paranoid Ideation, Rumination and Tangential  Suicidal Thoughts:  No is paranoid and a potential danger to self or others  Homicidal Thoughts:  No  Memory:  Immediate;   Fair Recent;   Fair Remote;   Fair  Judgement:  Impaired  Insight:  Lacking  Psychomotor Activity:  Restlessness  Concentration:  Concentration: Poor  Recall:  [employer] of Knowledge:  Fair  Language:  Fair  Akathisia:  No  Handed:  Right   AIMS (if indicated):     Assets:  Housing  ADL's:  Impaired  Cognition:  WNL  Sleep:  Number of Hours: 7     Treatment Plan Summary: Daily contact with patient to assess and evaluate symptoms and progress in treatment and Medication management  Assessment /Plan : Ameya is a 35 year old Caucasian male, who is on SSD , has a hx of PTSD, gender dysphoria as well as polysubstance abuse , presented IVCed for disorganized behavior and suicidal thoughts. Pt continues to be paranoid, labile. Will continue treatment.  Pt states he wants his zyprexa changed to seroquel - reports zyprexa as not helping.Will crosstitrate zyprexa with seroquel.   Reviewed past medical records,treatment plan.  Paxil 20 mg po daily for PTSD sx. Depakote DR 500 mg PO BID for mood control. Depakote level in 5 days . Will crosstaper zyprexa with Seroquel for psychosis/mood sx. Restarted home medications where indicated. Will continue to monitor vitals ,medication compliance and treatment side effects while patient is here.  Will monitor for medical issues as well as call consult as needed. EKG reviewed - qtc wnl. CSW will continue working on disposition. [facility] [place] [facility]. Patient to participate in therapeutic milieu .   Makyah Lavigne, MD 11/04/2015, 1:46 PM

## 2015-11-04 NOTE — Plan of Care (Signed)
Problem: Health Behavior/Discharge Planning: Goal: Compliance with treatment plan for underlying cause of condition will improve Outcome: Not Progressing Pt agitated since beginning of shift. Did not attend evening wrap up group

## 2015-11-04 NOTE — Progress Notes (Signed)
Recreation Therapy Notes  INPATIENT RECREATION THERAPY ASSESSMENT  Patient Details Name: Charlynn GrimesJames B Badolato MRN: [id number] DOB: 07-05-1980 Today's Date: 11/04/2015  Patient Stressors: Family, Relationship  Pt stated he was here because of "liars".  Coping Skills:   Isolate, Arguments, Avoidance, Exercise, Art/Dance, Talking, Music, Other (Comment) (Play with dogs)  Personal Challenges: Concentration, Relationships, Trusting Others  Leisure Interests (2+):  Music - Listen, Individual - Computer, Individual - Other (Comment), Sports - Exercise (Comment) (Walking dogs, Ta-chi)  Awareness of Community Resources:  Yes  Community Resources:  Library, [employer], [employer]  Current Use: No  If no, Barriers?: Other (Comment) ("No time in the free world to use it")  Patient Strengths:  survivor, "my body"  Patient Identified Areas of Improvement:  "discernment, volunteer time"  Current Recreation Participation:  Everyday  Patient Goal for Hospitalization:  "[facility]"  [place]  [place]   Current SI (including self-harm):  No  Current HI:  No  Consent to Intern Participation: N/A  Caroll RancherMarjette Raylea Adcox, LRT/CTRS   Caroll RancherLindsay, Bryona Foxworthy A 11/04/2015, 12:36 PM
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
## 2015-11-05 MED ORDER — LIDOCAINE 5 % EX PTCH
1.0000 | MEDICATED_PATCH | CUTANEOUS | Status: DC
  Administered 2015-11-05: 1 via TRANSDERMAL
  Filled 2015-11-05 (×8): qty 1

## 2015-11-05 MED ORDER — QUETIAPINE FUMARATE 100 MG PO TABS
100.0000 mg | ORAL_TABLET | Freq: Every day | ORAL | Status: DC
  Filled 2015-11-05: qty 1

## 2015-11-05 MED ORDER — METHOCARBAMOL 500 MG PO TABS
500.0000 mg | ORAL_TABLET | Freq: Three times a day (TID) | ORAL | Status: DC | PRN
  Administered 2015-11-07 – 2015-11-08 (×2): 500 mg via ORAL
  Filled 2015-11-05 (×2): qty 1

## 2015-11-05 MED ORDER — PAROXETINE HCL 10 MG PO TABS
10.0000 mg | ORAL_TABLET | Freq: Every day | ORAL | Status: DC
  Administered 2015-11-07 – 2015-11-11 (×5): 10 mg via ORAL
  Filled 2015-11-05 (×8): qty 1

## 2015-11-05 MED ORDER — QUETIAPINE FUMARATE 200 MG PO TABS
200.0000 mg | ORAL_TABLET | Freq: Every day | ORAL | Status: DC
  Administered 2015-11-05: 200 mg via ORAL
  Filled 2015-11-05 (×4): qty 1

## 2015-11-05 NOTE — Progress Notes (Signed)
[facility]  11/05/2015 12:52 PM Joshua Davila  MRN:  [id number] Subjective: Pt states " I want my provigil to be increased , I am in pain , I want something for pain, I want a muscle relaxant.'   Objective:Joshua Davila is a 35 year old Caucasian male, who is on SSD , has a hx of schizoaffective do , PTSD, gender dysphoria as well as polysubstance abuse , presented IVCed for disorganized behavior and suicidal thoughts.   Pt seen and chart reviewed and discussed with nursing staff.  Pt today remains intrusive , labile, irritable. He is not as pressured as her were yesterday , however per staff continues to be medication seeking demanding and requires redirection on the unit . Pt continues to remain focussed on his provigil .  Writer reviewed most recent discharge summary obtained [facility] [place] - 09/[facility]/15- 09/30/15 - during that admission [facility]. Pt at that time was taken off of seroquel, paxil as well as provigil and was discharged on lamictal and risperidone.   Principal Problem: Schizoaffective disorder, bipolar type (HCC) Diagnosis:   Patient Active Problem List   Diagnosis Date Noted  . Polysubstance abuse [F19.10]   . Schizoaffective disorder, bipolar type (HCC) [F25.0] 10/02/2015  . Involuntary commitment [Z04.6] 10/02/2015  . Cannabis use disorder, moderate, dependence (HCC) [F12.20] 09/16/2015  . Opioid use disorder, moderate, dependence (HCC) [F11.20] 09/16/2015  . 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 Benzodiazepine dependence (HCC) [F13.20] 10/04/2013  . PTSD (post-traumatic stress disorder) [F43.10] 10/03/2013  . Polysubstance dependence (HCC) [F19.20] 10/02/2013    Total Time spent with patient: 25 minutes  Past Psychiatric History: see H&P  Past Medical History:  Past Medical History:  Diagnosis Date  . Depression   . Gender identity disorder   . Liver disorder   . PTSD (post-traumatic stress disorder)   . Schizoaffective disorder (HCC)    History reviewed. No pertinent surgical history. Family History:  Family History  Problem Relation Age of Onset  . Multiple sclerosis Mother   . Mental illness Neg Hx    Family Psychiatric  History: see H&P Social History:  History  Alcohol Use  . Yes    Comment: occasional     History  Drug Use  . Types: Cocaine, Methamphetamines, Marijuana    Comment: last useof methamphetamines and marijuana yesterday    Social History   Social History  . Marital status: Single    Spouse name: N/A  . Number of children: N/A  . Years of education: N/A   Social History Main Topics  . Smoking status: Current Every Day Smoker    Packs/day: 1.00    Types: Cigarettes  . Smokeless tobacco: Never Used  . Alcohol use Yes     Comment: occasional  . Drug use:     Types: Cocaine, Methamphetamines, Marijuana     Comment: last useof methamphetamines and marijuana yesterday  . Sexual activity: Not Currently   Other Topics Concern  . None   Social History Narrative  . None   Additional Social History:    Pain Medications: Pt denies Prescriptions: Pt cannot remember Over the Counter: Pt denies History of alcohol / drug use?: Yes Longest period of sobriety (when/how long): 50 days Negative Consequences  of Use: Financial, Legal, Personal relationships Withdrawal Symptoms: Seizures, Irritability, Sweats, Tremors Onset of Seizures: Four years ago Date of most recent seizure: Four years ago Name of Substance 1: Marijuana 1 - Age of First Use: unknown 1 - Amount (size/oz): unknown 1 - Frequency: daily 1 - Duration: ongoing 1 - Last Use / Amount: 10/29/15                  Sleep:  Poor  Appetite:  Good  Current Medications: Current Facility-Administered Medications  Medication Dose Route Frequency Provider Last Rate Last Dose  . acetaminophen (TYLENOL) tablet 650 mg  650 mg Oral Q6H PRN Charm Rings, NP   650 mg at 11/05/15 0753  . alum & mag hydroxide-simeth (MAALOX/MYLANTA) 200-200-20 MG/5ML suspension 30 mL  30 mL Oral PRN Charm Rings, NP      . atorvastatin (LIPITOR) tablet 20 mg  20 mg Oral Daily Charm Rings, NP   20 mg at 11/05/15 0753  . benztropine (COGENTIN) tablet 0.5 mg  0.5 mg Oral BID Jomarie Longs, MD   0.5 mg at 11/05/15 0753  . divalproex (DEPAKOTE) DR tablet 500 mg  500 mg Oral BID Jomarie Longs, MD   500 mg at 11/05/15 0753  . emtricitabine-tenofovir AF (DESCOVY) 200-25 MG per tablet 1 tablet  1 tablet Oral Daily Charm Rings, NP   1 tablet at 11/05/15 0753  . ibuprofen (ADVIL,MOTRIN) tablet 600 mg  600 mg Oral Q8H PRN Charm Rings, NP   600 mg at 11/05/15 1101  . LORazepam (ATIVAN) tablet 1 mg  1 mg Oral Q8H PRN Charm Rings, NP   1 mg at 11/04/15 2137  . OLANZapine zydis (ZYPREXA) disintegrating tablet 5 mg  5 mg Oral Q8H PRN Oletta Darter, MD   5 mg at 11/05/15 1101   And  . LORazepam (ATIVAN) tablet 1 mg  1 mg Oral PRN Oletta Darter, MD       And  . ziprasidone (GEODON) injection 20 mg  20 mg Intramuscular PRN Oletta Darter, MD      . magnesium hydroxide (MILK OF MAGNESIA) suspension 30 mL  30 mL Oral Daily PRN Charm Rings, NP      . methocarbamol (ROBAXIN) tablet 500 mg  500 mg Oral Q8H PRN Jomarie Longs, MD      . nicotine polacrilex (NICORETTE) gum 2 mg  2 mg Oral PRN Charm Rings, NP   2 mg at 11/05/15 1102  . ondansetron (ZOFRAN) tablet 4 mg  4 mg Oral Q8H PRN Charm Rings, NP      . Melene Muller ON 11/06/2015] PARoxetine (PAXIL) tablet 10 mg  10 mg Oral Daily Steffan Caniglia, MD      . QUEtiapine (SEROQUEL) tablet 100 mg  100 mg Oral QHS Benecio Kluger, MD      . traZODone (DESYREL) tablet 50 mg  50 mg Oral QHS PRN  Jomarie Longs, MD   50 mg at 11/04/15 2137    Lab Results: No results found for this or any previous visit (from the past 48 hour(s)).  Blood Alcohol level:  Lab Results  Component Value Date   [facility] <5 10/30/2015   ETH <5 10/01/2015    Metabolic Disorder Labs: Lab Results  Component Value Date   HGBA1C 5.1 09/17/2015   MPG 100 09/17/2015   No results found for: PROLACTIN Lab Results  Component Value Date   CHOL 191 09/17/2015   TRIG 196 (H) 09/17/2015  HDL 27 (L) 09/17/2015   CHOLHDL 7.1 09/17/2015   VLDL 39 09/17/2015   LDLCALC 125 (H) 09/17/2015    Physical Findings: AIMS: Facial and Oral Movements Muscles of Facial Expression: None, normal Lips and Perioral Area: None, normal Jaw: None, normal Tongue: None, normal,Extremity Movements Upper (arms, wrists, hands, fingers): None, normal Lower (legs, knees, ankles, toes): None, normal, Trunk Movements Neck, shoulders, hips: None, normal, Overall Severity Severity of abnormal movements (highest score from questions above): None, normal Incapacitation due to abnormal movements: None, normal Patient's awareness of abnormal movements (rate only patient's report): No Awareness, Dental Status Current problems with teeth and/or dentures?: No Does patient usually wear dentures?: No  CIWA:    COWS:     Musculoskeletal: Strength & Muscle Tone: within normal limits Gait & Station: normal Patient leans: N/A  Psychiatric Specialty Exam: Physical Exam  Nursing note and vitals reviewed.   Review of Systems  Musculoskeletal: Positive for back pain.  Psychiatric/Behavioral: Positive for depression. The patient is nervous/anxious.   All other systems reviewed and are negative.   Blood pressure 113/66, pulse 89, temperature 97.6 F (36.4 C), temperature source Oral, resp. rate 18, height 6' (1.829 m), weight 84.4 kg (186 lb), SpO2 100 %.Body mass index is 25.23 kg/m.  General Appearance: Guarded  Eye Contact:  Good   Speech:  Pressured  Volume:  Normal  Mood:  Anxious and Irritable  Affect:  Labile  Thought Process:  Disorganized, Irrelevant and Descriptions of Associations: Loose  Orientation:  Full (Time, Place, and Person)  Thought Content:  Delusions, Paranoid Ideation and Rumination  Suicidal Thoughts:  No is paranoid and a potential danger to self or others  Homicidal Thoughts:  No  Memory:  Immediate;   Fair Recent;   Fair Remote;   Fair  Judgement:  Impaired  Insight:  Lacking  Psychomotor Activity:  Restlessness  Concentration:  Concentration: Poor  Recall:  FiservFair  Fund of Knowledge:  Fair  Language:  Fair  Akathisia:  No  Handed:  Right  AIMS (if indicated):     Assets:  Housing  ADL's:  Impaired  Cognition:  WNL  Sleep:  Number of Hours: 6.5     Treatment Plan Summary: Daily contact with patient to assess and evaluate symptoms and progress in treatment and Medication management  Assessment /Plan : Joshua FearingJames is a 35 year old Caucasian male, who is on SSD , has a hx of PTSD, gender dysphoria as well as polysubstance abuse , presented IVCed for disorganized behavior and suicidal thoughts. Pt continues to be paranoid, labile. Will continue treatment.   Reviewed past medical records,treatment plan. see above for details. Will taper down Paxil to 10 mg po daily for PTSD sx. Depakote DR 500 mg PO BID for mood control. Depakote level on 11/07/15. Will continue to crosstaper zyprexa with Seroquel for psychosis/mood sx. Will DC Provigil since it may be stimulating- [facility]. Restarted home medications where indicated. Will continue to monitor vitals ,medication compliance and treatment side effects while patient is here.  Will monitor for medical issues as well as call consult as needed. EKG reviewed - qtc wnl. CSW will continue working on disposition. [facility]. Patient to participate in therapeutic  milieu .   Markee Remlinger, MD 11/05/2015, 12:52 PM

## 2015-11-05 NOTE — Progress Notes (Signed)
DAR NOTE: Pt present with flat affect and depressed mood in the unit. Pt has been interacting well with peers and staff. Pt complained of pain in her lower back, lidocaine patch ordered. Pt took all his meds as scheduled. As per self inventory, pt had a good night sleep, good appetite, low energy, and good concentration. Pt rate depression at 8, hopeless ness at 7, and anxiety at 1. Pt's safety ensured with 15 minute and environmental checks. Pt currently denies SI/HI and A/V hallucinations. Pt verbally agrees to seek staff if SI/HI or A/VH occurs and to consult with staff before acting on these thoughts. Will continue POC.

## 2015-11-05 NOTE — Progress Notes (Signed)
Joshua Davila. Joshua Davila had been up and visible in milieu this evening, did attend evening group activity. Joshua FearingJames has complained of anxiety and agitation this evening and thought process was pressured and tangential with paranoid themes during various interactions. Joshua FearingJames readily took all bedtime medications without incident and did not verbalize any complaints of pain. A. Support and encouragement provided. R. Safety maintained, will continue to monitor.

## 2015-11-05 NOTE — [facility] Group Notes (Signed)
[facility] LCSW Group Therapy  11/05/2015 , 3:19 PM   Type of Therapy:  Group Therapy  Participation Level:  Active  Participation Quality:  Attentive  Affect:  Appropriate  Cognitive:  Alert  Insight:  Improving  Engagement in Therapy:  Engaged  Modes of Intervention:  Discussion, Exploration and Socialization  Summary of Progress/Problems: Today's group focused on the term Diagnosis.  Participants were asked to define the term, and then pronounce whether it is a negative, positive or neutral term. Stayed the entire time, engaged throughout.  Filter was not in place; blurted out whatever came to mind-mostly irrelevent.  Usually fee association.  Difficult to redirect, but other patients were able to ignore for the most part.  Did give encouragement to another patient who was struggling.  Joshua Davila, Joshua Davila 11/05/2015 , 3:19 PM

## 2015-11-05 NOTE — Progress Notes (Signed)
Recreation Therapy Notes  Animal-Assisted Activity (AAA) Program Checklist/Progress Notes Patient Eligibility Criteria Checklist & Daily Group note for Rec Tx Intervention  Date: 08.15.2017 Time: 2:45pm [address]    AAA/T Program Assumption of Risk Form signed by Patient/ or Parent Legal Guardian Yes  Patient is free of allergies or sever asthma Yes  Patient reports no fear of animals Yes  Patient reports no history of cruelty to animals Yes  Patient understands his/her participation is voluntary Yes  Behavioral Response: Did not attend.    Clinical Observations/Feedback: Patient discussed with MD for appropriateness in pet therapy session. Both LRT and MD agree patient is appropriate for participation. Patient offered participation in session and signed necessary consent form without issue. Despite signing consent form and verbalizing desire to attend session patient did not attend when provided opportunity.   Marykay Lexenise L Aiven Kampe, LRT/CTRS  Jearl KlinefelterBlanchfield, Madalynn Pickelsimer L 11/05/2015 3:18 PM

## 2015-11-05 NOTE — Progress Notes (Signed)
Recreation Therapy Notes  Date: 11/05/15 Time: 1000 Location: [address] Day[address]  Group Topic: Leisure Education  Goal Area(s) Addresses:  Patient will identify positive leisure activities.  Patient will identify one positive benefit of participation in leisure activities.   Behavioral Response: Engaged  Intervention: sponge balls, 20 cups  Activity: Wacky Bowling:  LRT divided the group into two teams.  LRT set up the two sets of cups like bowling pins.  Each pt on the teams took turns knocking down the cups.  Each pt got two chances per turn to knock down the cups.  Education:  Leisure Education, Building control surveyorDischarge Planning  Education Outcome: Acknowledges education/In group clarification offered/Needs additional education  Clinical Observations/Feedback:  Pt was active and engaged with the activity.  Pt encouraged teammates.  Pt left group early and did not return.    Caroll RancherMarjette Bayan Kushnir, LRT/CTRS     Caroll RancherLindsay, Younique Casad A 11/05/2015 12:13 PM

## 2015-11-05 NOTE — Progress Notes (Signed)
Patient ID: Joshua GrimesJames B Hakeem, male   DOB: 22-Jul-1980, 35 y.o.   MRN: [id number] PER STATE REGULATIONS 482.30  THIS CHART WAS REVIEWED FOR MEDICAL NECESSITY WITH RESPECT TO THE PATIENT'S ADMISSION/ DURATION OF STAY.  NEXT REVIEW DATE: 11/09/2015  Willa RoughJENNIFER JONES Friedrich Harriott, RN, BSN CASE MANAGER

## 2015-11-06 MED ORDER — ATOMOXETINE HCL 10 MG PO CAPS
20.0000 mg | ORAL_CAPSULE | Freq: Every day | ORAL | Status: DC
  Administered 2015-11-06: 20 mg via ORAL
  Filled 2015-11-06 (×3): qty 2

## 2015-11-06 MED ORDER — QUETIAPINE FUMARATE 300 MG PO TABS
300.0000 mg | ORAL_TABLET | Freq: Every day | ORAL | Status: DC
  Administered 2015-11-07: 300 mg via ORAL
  Filled 2015-11-06 (×5): qty 1

## 2015-11-06 NOTE — Progress Notes (Signed)
Recreation Therapy Notes  Date: 11/06/15 Time: 1000 Location: [address] Day[address]  Group Topic: Self-Esteem  Goal Area(s) Addresses:  Patient will identify positive ways to increase self-esteem. Patient will verbalize benefit of increased self-esteem.  Behavioral Response:  Engaged  Intervention: Worksheets with Davila blank face, markers, colored pencils, crayons  Activity: How I See Me.  LRT will each patient Davila worksheet with Davila blank face on it.  Each patient is to draw or write how they see themselves on the face of the worksheet.    Education:  Self-Esteem, Building control surveyorDischarge Planning.   Education Outcome: Acknowledges education/In group clarification offered/Needs additional education  Clinical Observations/Feedback: Pt stated he was resourceful and he never fails.  Pt was hyper religious and focused on the bible.   Joshua RancherMarjette Sage Davila, LRT/CTRS    Joshua RancherLindsay, Joshua Davila 11/06/2015 1:07 PM

## 2015-11-06 NOTE — Progress Notes (Addendum)
[facility] MD Progress Note  11/06/2015 3:04 PM Charlynn GrimesJames B Keener  MRN:  [id number] Subjective: Pt states " I want my provigil , I want something for my ADHD .'  Objective:Liron is a 35 year old Caucasian male, who is on SSD , has a hx of schizoaffective do , PTSD, gender dysphoria as well as polysubstance abuse , presented IVCed for disorganized behavior and suicidal thoughts.   Pt seen and chart reviewed and discussed with nursing staff.  Pt today remains intrusive , labile, irritable.Pt is focussed on getting his provigil back . Discussed medication readjustments with patient and that his Provigil was [facility] [place]. Pt however continues to be persistent about getting back , demanding - requiring redirection. Pt with limited insight - per staff continues to need encouragement.     Principal Problem: Schizoaffective disorder, bipolar type (HCC) Diagnosis:   Patient Active Problem List   Diagnosis Date Noted  . Polysubstance abuse [F19.10]   . Schizoaffective disorder, bipolar type (HCC) [F25.0] 10/02/2015  . Involuntary commitment [Z04.6] 10/02/2015  . Cannabis use disorder, moderate, dependence (HCC) [F12.20] 09/16/2015  . Opioid use disorder, moderate, dependence (HCC) [F11.20] 09/16/2015  . 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 Benzodiazepine dependence (HCC) [F13.20] 10/04/2013  . PTSD (post-traumatic stress disorder) [F43.10] 10/03/2013  . Polysubstance dependence (HCC) [F19.20] 10/02/2013   Total Time spent with patient: 25 minutes  Past Psychiatric History: see H&P  Past Medical History:  Past Medical History:  Diagnosis Date  . Depression   . Gender identity disorder   . Liver  disorder   . PTSD (post-traumatic stress disorder)   . Schizoaffective disorder (HCC)    History reviewed. No pertinent surgical history. Family History:  Family History  Problem Relation Age of Onset  . Multiple sclerosis Mother   . Mental illness Neg Hx    Family Psychiatric  History: see H&P Social History:  History  Alcohol Use  . Yes    Comment: occasional     History  Drug Use  . Types: Cocaine, Methamphetamines, Marijuana    Comment: last useof methamphetamines and marijuana yesterday    Social History   Social History  . Marital status: Single    Spouse name: N/A  . Number of children: N/A  . Years of education: N/A   Social History Main Topics  . Smoking status: Current Every Day Smoker    Packs/day: 1.00    Types: Cigarettes  . Smokeless tobacco: Never Used  . Alcohol use Yes     Comment: occasional  . Drug use:     Types: Cocaine, Methamphetamines, Marijuana     Comment: last useof methamphetamines and marijuana yesterday  . Sexual activity: Not Currently   Other Topics Concern  . None   Social History Narrative  . None   Additional Social History:    Pain Medications: Pt denies Prescriptions: Pt cannot remember Over the Counter: Pt denies History of alcohol / drug use?: Yes Longest period of sobriety (when/how long): 50 days Negative Consequences of Use: Financial, Legal, Personal relationships Withdrawal Symptoms: Seizures, Irritability, Sweats, Tremors Onset of Seizures: Four years ago Date of most recent seizure: Four years ago Name of Substance 1: Marijuana 1 - Age of First Use: unknown 1 - Amount (size/oz): unknown  1 - Frequency: daily 1 - Duration: ongoing 1 - Last Use / Amount: 10/29/15                  Sleep: Poor  Appetite:  Good  Current Medications: Current Facility-Administered Medications  Medication Dose Route Frequency Provider Last Rate Last Dose  . acetaminophen (TYLENOL) tablet 650 mg  650 mg Oral Q6H PRN  Charm RingsJamison Y Lord, NP   650 mg at 11/05/15 0753  . alum & mag hydroxide-simeth (MAALOX/MYLANTA) 200-200-20 MG/5ML suspension 30 mL  30 mL Oral PRN Charm RingsJamison Y Lord, NP      . atomoxetine (STRATTERA) capsule 20 mg  20 mg Oral Daily Jomarie LongsSaramma Avish Torry, MD   20 mg at 11/06/15 1154  . atorvastatin (LIPITOR) tablet 20 mg  20 mg Oral Daily Charm RingsJamison Y Lord, NP   20 mg at 11/06/15 0735  . benztropine (COGENTIN) tablet 0.5 mg  0.5 mg Oral BID Jomarie LongsSaramma Ghadeer Kastelic, MD   0.5 mg at 11/06/15 0735  . divalproex (DEPAKOTE) DR tablet 500 mg  500 mg Oral BID Jomarie LongsSaramma Jakeim Sedore, MD   500 mg at 11/06/15 0735  . emtricitabine-tenofovir AF (DESCOVY) 200-25 MG per tablet 1 tablet  1 tablet Oral Daily Charm RingsJamison Y Lord, NP   1 tablet at 11/05/15 0753  . ibuprofen (ADVIL,MOTRIN) tablet 600 mg  600 mg Oral Q8H PRN Charm RingsJamison Y Lord, NP   600 mg at 11/05/15 1101  . lidocaine (LIDODERM) 5 % 1 patch  1 patch Transdermal Q24H Jomarie LongsSaramma Reanne Nellums, MD   1 patch at 11/05/15 1553  . LORazepam (ATIVAN) tablet 1 mg  1 mg Oral Q8H PRN Charm RingsJamison Y Lord, NP   1 mg at 11/06/15 0742  . OLANZapine zydis (ZYPREXA) disintegrating tablet 5 mg  5 mg Oral Q8H PRN Oletta DarterSalina Agarwal, MD   5 mg at 11/06/15 1341   And  . LORazepam (ATIVAN) tablet 1 mg  1 mg Oral PRN Oletta DarterSalina Agarwal, MD       And  . ziprasidone (GEODON) injection 20 mg  20 mg Intramuscular PRN Oletta DarterSalina Agarwal, MD      . magnesium hydroxide (MILK OF MAGNESIA) suspension 30 mL  30 mL Oral Daily PRN Charm RingsJamison Y Lord, NP      . methocarbamol (ROBAXIN) tablet 500 mg  500 mg Oral Q8H PRN Jomarie LongsSaramma Estella Malatesta, MD      . nicotine polacrilex (NICORETTE) gum 2 mg  2 mg Oral PRN Charm RingsJamison Y Lord, NP   2 mg at 11/06/15 1154  . ondansetron (ZOFRAN) tablet 4 mg  4 mg Oral Q8H PRN Charm RingsJamison Y Lord, NP      . PARoxetine (PAXIL) tablet 10 mg  10 mg Oral Daily Takila Kronberg, MD      . QUEtiapine (SEROQUEL) tablet 200 mg  200 mg Oral QHS Jomarie LongsSaramma Marcell Chavarin, MD   200 mg at 11/05/15 2127  . traZODone (DESYREL) tablet 50 mg  50 mg Oral QHS PRN Jomarie LongsSaramma  Elizabet Schweppe, MD   50 mg at 11/05/15 2128    Lab Results: No results found for this or any previous visit (from the past 48 hour(s)).  Blood Alcohol level:  Lab Results  Component Value Date   [facility] 10/30/2015   ETH <5 10/01/2015    Metabolic Disorder Labs: Lab Results  Component Value Date   HGBA1C 5.1 09/17/2015   MPG 100 09/17/2015   No results found for: PROLACTIN Lab Results  Component Value Date   CHOL 191 09/17/2015   TRIG 196 (H)  09/17/2015   HDL 27 (L) 09/17/2015   CHOLHDL 7.1 09/17/2015   VLDL 39 09/17/2015   LDLCALC 125 (H) 09/17/2015    Physical Findings: AIMS: Facial and Oral Movements Muscles of Facial Expression: None, normal Lips and Perioral Area: None, normal Jaw: None, normal Tongue: None, normal,Extremity Movements Upper (arms, wrists, hands, fingers): None, normal Lower (legs, knees, ankles, toes): None, normal, Trunk Movements Neck, shoulders, hips: None, normal, Overall Severity Severity of abnormal movements (highest score from questions above): None, normal Incapacitation due to abnormal movements: None, normal Patient's awareness of abnormal movements (rate only patient's report): No Awareness, Dental Status Current problems with teeth and/or dentures?: No Does patient usually wear dentures?: No  CIWA:    COWS:     Musculoskeletal: Strength & Muscle Tone: within normal limits Gait & Station: normal Patient leans: N/A  Psychiatric Specialty Exam: Physical Exam  Nursing note and vitals reviewed.   Review of Systems  Musculoskeletal: Positive for back pain.  Psychiatric/Behavioral: Positive for depression. The patient is nervous/anxious.   All other systems reviewed and are negative.   Blood pressure 117/66, pulse 93, temperature 99 F (37.2 C), resp. rate 18, height 6' (1.829 m), weight 84.4 kg (186 lb), SpO2 100 %.Body mass index is 25.23 kg/m.  General Appearance: Guarded  Eye Contact:  Good  Speech:  Pressured  Volume:  Normal   Mood:  Anxious and Irritable  Affect:  Labile  Thought Process:  Disorganized, Irrelevant and Descriptions of Associations: Loose  Orientation:  Full (Time, Place, and Person)  Thought Content:  Delusions, Paranoid Ideation and Rumination  Suicidal Thoughts:  No is paranoid and a potential danger to self or others  Homicidal Thoughts:  No  Memory:  Immediate;   Fair Recent;   Fair Remote;   Fair  Judgement:  Impaired  Insight:  Lacking  Psychomotor Activity:  Restlessness  Concentration:  Concentration: Poor  Recall:  [employer] of Knowledge:  Fair  Language:  Fair  Akathisia:  No  Handed:  Right  AIMS (if indicated):     Assets:  Housing  ADL's:  Impaired  Cognition:  WNL  Sleep:  Number of Hours: 7     Treatment Plan Summary: Daily contact with patient to assess and evaluate symptoms and progress in treatment and Medication management  Assessment /Plan : Dionisios is a 35 year old Caucasian male, who is on SSD , has a hx of PTSD, gender dysphoria as well as polysubstance abuse , presented IVCed for disorganized behavior and suicidal thoughts. Pt continues to be paranoid, labile. Will continue treatment.   Reviewed past medical records,treatment plan. see above for details. Will continue  Paxil to 10 mg po daily for PTSD sx. Depakote DR 500 mg PO BID for mood control. Depakote level on 11/07/15. Will continue to crosstaper zyprexa with Seroquel for psychosis/mood sx.Will increase seroquel to 300 mg po qhs for psychosis/mood sx/sleep. Start Strattera 20 mg po daily for attention issues. Discontinued Provigil since it may be stimulating- was discontinued during his stay at [facility] in July. Restarted home medications where indicated. Will continue to monitor vitals ,medication compliance and treatment side effects while patient is here.  Will monitor for medical issues as well as call consult as needed. EKG reviewed - qtc wnl. CSW will continue working on disposition.  [facility]. Patient to participate in therapeutic milieu .   Jemimah Cressy, MD 11/06/2015, 3:04 PM

## 2015-11-06 NOTE — Progress Notes (Signed)
DAR NOTE: Pt has been very labile and argumentative, pt stated " I want get the hell out of here, I am confined here with nothing to do." Pt was redirected, offered medications as scheduled. Denies pain, auditory and visual hallucinations.  Rates depression at 5, hopelessness at 5, and anxiety at 5.  Maintained on routine safety checks.  Support and encouragement offered as needed. Patient observed socializing with peers in the dayroom.  Offered no complaint.

## 2015-11-06 NOTE — Progress Notes (Signed)
D: Joshua Davila is very conversant this evening. His mood is labile. He goes from speaking pleasantly of his dogs to angrily voicing his disdain for his spouse having him IVC'd. Denies SI/HI/AVH at this time. Contracts for safety.  A: Encouragement and support given. Q15 minute room checks for patient safety. Medications administered as prescribed.  R: Continue to monitor for patient safety and medication effectiveness.

## 2015-11-06 NOTE — Progress Notes (Signed)
Pt invited, but declined to attend wrap-up group this evening.

## 2015-11-06 NOTE — [facility] Group Notes (Signed)
[facility]  11/06/2015 , 1:25 PM    Type of Therapy:  [employer] Presentation  Participation Level:  Active  Participation Quality:  Attentive  Affect:  Blunted  Cognitive:  Oriented  Insight:  Limited  Engagement in Therapy:  Engaged  Modes of Intervention:  Discussion, Education and Socialization  Summary of Progress/Problems:  Onalee HuaDavid from [employer] came to present his recovery story and play the guitar.  Invited.  Chose to not attend.  Daryel Geraldorth, Zeshan Sena B 11/06/2015 , 1:25 PM

## 2015-11-07 LAB — VALPROIC ACID LEVEL: Valproic Acid Lvl: 24 ug/mL — ABNORMAL LOW (ref 50.0–100.0)

## 2015-11-07 MED ORDER — DIVALPROEX SODIUM 250 MG PO DR TAB
250.0000 mg | DELAYED_RELEASE_TABLET | ORAL | Status: DC
  Administered 2015-11-07 – 2015-11-08 (×3): 250 mg via ORAL
  Filled 2015-11-07 (×7): qty 1

## 2015-11-07 MED ORDER — DIVALPROEX SODIUM 250 MG PO DR TAB
750.0000 mg | DELAYED_RELEASE_TABLET | Freq: Every day | ORAL | Status: DC
  Administered 2015-11-07: 750 mg via ORAL
  Filled 2015-11-07 (×3): qty 3

## 2015-11-07 NOTE — Progress Notes (Signed)
Adult Psychoeducational Group Note  Date:  11/07/2015 Time:  8:38 PM  Group Topic/Focus:  Wrap-Up Group:   The focus of this group is to help patients review their daily goal of treatment and discuss progress on daily workbooks.   Participation Level:  Active  Participation Quality:  Appropriate  Affect:  Appropriate  Cognitive:  Appropriate  Insight: Appropriate  Engagement in Group:  Engaged  Modes of Intervention:  Discussion  Additional Comments:The patient expressed he attended group.The patient also said that he rate today a 4. Joshua Davila, Joshua Davila 11/07/2015, 8:38 PM

## 2015-11-07 NOTE — [facility] Group Notes (Signed)
[facility] Group Notes:  (Counselor/Nursing/MHT/Case Management/Adjunct)  11/07/2015 1:15PM  Type of Therapy:  Group Therapy  Participation Level:  Active  Participation Quality:  Appropriate  Affect:  Flat  Cognitive:  Oriented  Insight:  Improving  Engagement in Group:  Limited  Engagement in Therapy:  Limited  Modes of Intervention:  Discussion, Exploration and Socialization  Summary of Progress/Problems: The topic for group was balance in life.  Pt participated in the discussion about when their life was in balance and out of balance and how this feels.  Pt discussed ways to get back in balance and short term goals they can work on to get where they want to be.  Joshua Davila stated that she felt emotionally unbalanced due to not having the opportunity to participate in her normal daily routines since being here in the hospital. She stated that in order to regain "balance" she enjoys walking her dogs along with enjoying the scenery of nature. Joshua Davila stated that she felt somewhat depressed by being in the hospital and also by being forced to be around others. Joshua Davila contributed to the group by sharing a story of how she climbs trees and how by doing so, she has learned how to control being balanced and unbalanced. She was very animated in her presentation, pressured speech in her explanations and difficult to redirect. Joshua Davila, Joshua Davila 11/07/2015 2:41 PM

## 2015-11-07 NOTE — Progress Notes (Signed)
Patient ID: Joshua Davila, male   DOB: Feb 23, 1981, 35 y.o.   MRN: [id number] D) Pt mood has been labile, hypomanic. Speech is pressured and tangential. Thoughts disorganized. Pt observed doing tai chi and karate in the hall. Pt c/o about not going outside but will not go when it's rec time, preferring to go to bed. Pt has been positive for groups with minimal prompting. Denies avh or s.i. Contracts for safety. A) Level 3 obs for safety, support and encouragement provided. Med ed reinforced. Prompts as needed. R) Labile.

## 2015-11-07 NOTE — Progress Notes (Signed)
Recreation Therapy Notes  Date: 11/07/15 Time: 1000 Location: 500 Hall Dayroom  Group Topic: Communication, Team Building, Problem Solving  Goal Area(s) Addresses:  Patient will effectively work with peer towards shared goal.  Patient will identify skill used to make activity successful.  Patient will identify how skills used during activity can be used to reach post d/c goals.   Behavioral Response: Engaged  Intervention: STEM Activity   Activity: Glass blower/designeripe Cleaner Tower. In teams, patients were asked to build the tallest freestanding tower possible out of 15 pipe cleaners. Systematically resources were removed, for example patient ability to use both hands and patient ability to verbally communicate.    Education: Pharmacist, communityocial Skills, Building control surveyorDischarge Planning.   Education Outcome: Acknowledges education/In group clarification offered/Needs additional education.   Clinical Observations/Feedback: Pt worked well with his partner.  Pt stated they were able to complete the activity by not giving up and working together.   Caroll RancherMarjette Crispin Vogel, LRT/CTRS   Caroll RancherLindsay, Rayli Wiederhold A 11/07/2015 12:31 PM

## 2015-11-07 NOTE — Tx Team (Signed)
Interdisciplinary Treatment Plan Update (Adult)  Date:  11/07/2015   Time Reviewed:  4:51 PM   Progress in Treatment: Attending groups: Yes. Participating in groups:  No Taking medication as prescribed:  Yes. Tolerating medication:  Yes. Family/Significant other contact made: No  Patient understands diagnosis:  No  Limited insight Discussing patient identified problems/goals with staff:  Yes, see initial care plan. Medical problems stabilized or resolved:  Yes. Denies suicidal/homicidal ideation: Yes. Issues/concerns per patient self-inventory:  No. Other:  New problem(s) identified:  Discharge Plan or Barriers: see below  Reason for Continuation of Hospitalization: Delusions  Hallucinations Medication stabilization Other; describe Disorganization  Comments:  IVC paper took out by his spouse reports pt is becoming aggressive towards him and punching holes in the walls.  Pt was recently dx with schizophrenia.  Pt reports his spouse is stealing his meds and has cancelled all of his doctor's appt.  Pt is calm and cooperative.   11/04/15: Pt continues to be paranoid, labile. Will continue treatment.   Will taper down Paxil to 10 mg po daily for PTSD sx. Depakote DR 500 mg PO BID for mood control. Depakote level on 11/07/15. Will continue to crosstaper zyprexa with Seroquel for psychosis/mood sx. Will DC Provigil since it may be stimulating- was discontinued during his stay at [facility] in July. Restarted home medications where indicated.  8/17:  Pt continues to be paranoid, labile pressured and tearful. Will continue  Paxil to 10 mg po daily for PTSD sx. Will increase Depakote DR to 250 mg po bid and 750 mg po daily at 8 pm  for mood control. Depakote level on 11/07/15- subtherapeutic - 24.next depakote level in 11/11/15. Will continue seroquel 300 mg po qhs for psychosis/mood sx/sleep.     Estimated length of stay: 4-5 days  New goal(s):  Review of initial/current  patient goals per problem list:   Review of initial/current patient goals per problem list:  1. Goal(s): Patient will participate in aftercare plan   Met: Yes     Target date: 3-5 days post admission date   As evidenced by: Patient will participate within aftercare plan AEB aftercare provider and housing plan at discharge being identified. 11/07/2015:  Return home, follow up outpt   2. Goal (s): Patient will exhibit decreased depressive symptoms and suicidal ideations.   Met: Yes   Target date: 3-5 days post admission date   As evidenced by: Patient will utilize self rating of depression at 3 or below and demonstrate decreased signs of depression or be deemed stable for discharge by MD. 11/04/15:  Rates depression an 8 today 11/08/15:  Rates depression a 2 today       5. Goal(s): Patient will demonstrate decreased signs of psychosis  * Met: No  * Target date: 3-5 days post admission date  * As evidenced by: Patient will demonstrate decreased frequency of AVH or return to baseline function 11/04/15: Presents as disorganized, easily irritated, impulsive, tangential 11/07/15: Presentation much the same           Attendees: Patient:  11/07/2015 4:51 PM   Family:   11/07/2015 4:51 PM   Physician:  Ursula Alert, MD 11/07/2015 4:51 PM   Nursing:  Caryl Asp, RN 11/07/2015 4:51 PM   CSW:    Roque Lias, [place]   11/07/2015 4:51 PM   Other:  11/07/2015 4:51 PM   Other:   11/07/2015 4:51 PM   Other:  Lars Pinks, Nurse CM 11/07/2015 4:51 PM   Other:  11/07/2015 4:51 PM   Other:  Norberto Sorenson, [place]  11/07/2015 4:51 PM   Other:  11/07/2015 4:51 PM   Other:  11/07/2015 4:51 PM   Other:  11/07/2015 4:51 PM   Other:  11/07/2015 4:51 PM   Other:  11/07/2015 4:51 PM   Other:   11/07/2015 4:51 PM    Scribe for Treatment Team:   Trish Mage, 11/07/2015 4:51 PM

## 2015-11-07 NOTE — Progress Notes (Signed)
[facility]  11/07/2015 3:07 PM Charlynn GrimesJames B Penaflor  MRN:  [id number] Subjective: Pt states " I am depressed, I am anxious,I cannot help it.'   Objective:Nazier is a 35 year old Caucasian male, who is on SSD , has a hx of schizoaffective do , PTSD, gender dysphoria as well as polysubstance abuse , presented IVCed for disorganized behavior and suicidal thoughts.   Pt seen and chart reviewed and discussed with nursing staff.  Pt today remains intrusive , labile, irritable and is tearful often .Pt continues to be  focussed on getting his provigil back . Discussed coping techniques , readjusting his medications - however pt lacks insight - is seen as going back and forth about wanting to be back on stimulants as well as that he is unable to stop making use of PRN medications throughout the day.  Per staff - pt is tangential , pressured - continues to need encouragement and redirection on the unit.    Principal Problem: Schizoaffective disorder, bipolar type (HCC) Diagnosis:   Patient Active Problem List   Diagnosis Date Noted  . Polysubstance abuse [F19.10]   . Schizoaffective disorder, bipolar type (HCC) [F25.0] 10/02/2015  . Involuntary commitment [Z04.6] 10/02/2015  . Cannabis use disorder, moderate, dependence (HCC) [F12.20] 09/16/2015  . Opioid use disorder, moderate, dependence (HCC) [F11.20] 09/16/2015  . 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 Benzodiazepine dependence (HCC) [F13.20] 10/04/2013  . PTSD (post-traumatic stress disorder) [F43.10] 10/03/2013  . Polysubstance dependence (HCC) [F19.20] 10/02/2013   Total Time spent with patient: 30 minutes  Past Psychiatric History: see H&P  Past Medical History:  Past  Medical History:  Diagnosis Date  . Depression   . Gender identity disorder   . Liver disorder   . PTSD (post-traumatic stress disorder)   . Schizoaffective disorder (HCC)    History reviewed. No pertinent surgical history. Family History:  Family History  Problem Relation Age of Onset  . Multiple sclerosis Mother   . Mental illness Neg Hx    Family Psychiatric  History: see H&P Social History:  History  Alcohol Use  . Yes    Comment: occasional     History  Drug Use  . Types: Cocaine, Methamphetamines, Marijuana    Comment: last useof methamphetamines and marijuana yesterday    Social History   Social History  . Marital status: Single    Spouse name: N/A  . Number of children: N/A  . Years of education: N/A   Social History Main Topics  . Smoking status: Current Every Day Smoker    Packs/day: 1.00    Types: Cigarettes  . Smokeless tobacco: Never Used  . Alcohol use Yes     Comment: occasional  . Drug use:     Types: Cocaine, Methamphetamines, Marijuana     Comment: last useof methamphetamines and marijuana yesterday  . Sexual activity: Not Currently   Other Topics Concern  . None   Social History Narrative  . None   Additional Social History:    Pain Medications: Pt denies Prescriptions: Pt cannot remember Over the Counter: Pt denies History of alcohol / drug use?: Yes Longest period of sobriety (when/how long): 50 days Negative Consequences of Use: Financial, Legal, Personal relationships Withdrawal Symptoms: Seizures, Irritability, Sweats, Tremors Onset of Seizures: Four years ago Date  of most recent seizure: Four years ago Name of Substance 1: Marijuana 1 - Age of First Use: unknown 1 - Amount (size/oz): unknown 1 - Frequency: daily 1 - Duration: ongoing 1 - Last Use / Amount: 10/29/15                  Sleep: Fair  Appetite:  Good  Current Medications: Current Facility-Administered Medications  Medication Dose Route Frequency  Provider Last Rate Last Dose  . acetaminophen (TYLENOL) tablet 650 mg  650 mg Oral Q6H PRN Charm RingsJamison Y Lord, NP   650 mg at 11/05/15 0753  . alum & mag hydroxide-simeth (MAALOX/MYLANTA) 200-200-20 MG/5ML suspension 30 mL  30 mL Oral PRN Charm RingsJamison Y Lord, NP      . atorvastatin (LIPITOR) tablet 20 mg  20 mg Oral Daily Charm RingsJamison Y Lord, NP   20 mg at 11/07/15 [id number]  . benztropine (COGENTIN) tablet 0.5 mg  0.5 mg Oral BID Jomarie LongsSaramma Jaymond Waage, MD   0.5 mg at 11/07/15 0808  . divalproex (DEPAKOTE) DR tablet 250 mg  250 mg Oral [id number] Jomarie LongsSaramma Branden Vine, MD   250 mg at 11/07/15 1257  . divalproex (DEPAKOTE) DR tablet 750 mg  750 mg Oral Q2000 Travious Vanover, MD      . emtricitabine-tenofovir AF (DESCOVY) 200-25 MG per tablet 1 tablet  1 tablet Oral Daily Charm RingsJamison Y Lord, NP   1 tablet at 11/07/15 0809  . ibuprofen (ADVIL,MOTRIN) tablet 600 mg  600 mg Oral Q8H PRN Charm RingsJamison Y Lord, NP   600 mg at 11/05/15 1101  . lidocaine (LIDODERM) 5 % 1 patch  1 patch Transdermal Q24H Jomarie LongsSaramma Tyishia Aune, MD   1 patch at 11/05/15 1553  . LORazepam (ATIVAN) tablet 1 mg  1 mg Oral Q8H PRN Charm RingsJamison Y Lord, NP   1 mg at 11/06/15 1712  . magnesium hydroxide (MILK OF MAGNESIA) suspension 30 mL  30 mL Oral Daily PRN Charm RingsJamison Y Lord, NP      . methocarbamol (ROBAXIN) tablet 500 mg  500 mg Oral Q8H PRN Jomarie LongsSaramma Shakevia Sarris, MD   500 mg at 11/07/15 1255  . nicotine polacrilex (NICORETTE) gum 2 mg  2 mg Oral PRN Charm RingsJamison Y Lord, NP   2 mg at 11/06/15 1529  . OLANZapine zydis (ZYPREXA) disintegrating tablet 5 mg  5 mg Oral Q8H PRN Oletta DarterSalina Agarwal, MD   5 mg at 11/06/15 1341   And  . ziprasidone (GEODON) injection 20 mg  20 mg Intramuscular PRN Oletta DarterSalina Agarwal, MD      . ondansetron [facility](ZOFRAN) tablet 4 mg  4 mg Oral Q8H PRN Charm RingsJamison Y Lord, NP      . PARoxetine (PAXIL) tablet 10 mg  10 mg Oral Daily Jomarie LongsSaramma Purity Irmen, MD   10 mg at 11/07/15 0810  . QUEtiapine (SEROQUEL) tablet 300 mg  300 mg Oral QHS Kayliee Atienza, MD      . traZODone (DESYREL) tablet 50 mg  50 mg  Oral QHS PRN Jomarie LongsSaramma Jacklyne Baik, MD   50 mg at 11/05/15 2128    Lab Results:  Results for orders placed or performed during the hospital encounter of 11/01/15 (from the past 48 hour(s))  Valproic acid level     Status: Abnormal   Collection Time: 11/07/15  6:43 AM  Result Value Ref Range   Valproic Acid Lvl 24 (L) 50.0 - 100.0 ug/mL    Comment: [facility]    Blood Alcohol level:  Lab Results  Component Value Date  ETH <5 10/30/2015   ETH <5 10/01/2015    Metabolic Disorder Labs: Lab Results  Component Value Date   HGBA1C 5.1 09/17/2015   MPG 100 09/17/2015   No results found for: PROLACTIN Lab Results  Component Value Date   CHOL 191 09/17/2015   TRIG 196 (H) 09/17/2015   HDL 27 (L) 09/17/2015   CHOLHDL 7.1 09/17/2015   VLDL 39 09/17/2015   LDLCALC 125 (H) 09/17/2015    Physical Findings: AIMS: Facial and Oral Movements Muscles of Facial Expression: None, normal Lips and Perioral Area: None, normal Jaw: None, normal Tongue: None, normal,Extremity Movements Upper (arms, wrists, hands, fingers): None, normal Lower (legs, knees, ankles, toes): None, normal, Trunk Movements Neck, shoulders, hips: None, normal, Overall Severity Severity of abnormal movements (highest score from questions above): None, normal Incapacitation due to abnormal movements: None, normal Patient's awareness of abnormal movements (rate only patient's report): No Awareness, Dental Status Current problems with teeth and/or dentures?: No Does patient usually wear dentures?: No  CIWA:    COWS:     Musculoskeletal: Strength & Muscle Tone: within normal limits Gait & Station: normal Patient leans: N/A  Psychiatric Specialty Exam: Physical Exam  Nursing note and vitals reviewed.   Review of Systems  Musculoskeletal: Positive for back pain.  Psychiatric/Behavioral: Positive for depression. The patient is nervous/anxious.   All other systems reviewed and are  negative.   Blood pressure 104/64, pulse 76, temperature 98.2 F (36.8 C), temperature source Oral, resp. rate 16, height 6' (1.829 m), weight 84.4 kg (186 lb), SpO2 100 %.Body mass index is 25.23 kg/m.  General Appearance: Casual  Eye Contact:  Good  Speech:  Pressured  Volume:  Normal  Mood:  Anxious, Depressed and Irritable  Affect:  Labile and Tearful  Thought Process:  Disorganized, Irrelevant and Descriptions of Associations: Loose  Orientation:  Full (Time, Place, and Person)  Thought Content:  Delusions, Paranoid Ideation and Rumination  Suicidal Thoughts:  No is paranoid and a potential danger to self or others  Homicidal Thoughts:  No  Memory:  Immediate;   Fair Recent;   Fair Remote;   Fair  Judgement:  Impaired  Insight:  Lacking  Psychomotor Activity:  Restlessness  Concentration:  Concentration: Poor  Recall:  [employer] of Knowledge:  Fair  Language:  Fair  Akathisia:  No  Handed:  Right  AIMS (if indicated):     Assets:  Housing  ADL's:  Impaired  Cognition:  WNL  Sleep:  Number of Hours: 6.5     Treatment Plan Summary: Daily contact with patient to assess and evaluate symptoms and progress in treatment and Medication management  Assessment /Plan : Zacariah is a 35 year old Caucasian male, who is on SSD , has a hx of PTSD, gender dysphoria as well as polysubstance abuse , presented IVCed for disorganized behavior and suicidal thoughts. Pt continues to be paranoid, labile pressured and tearful . Will continue treatment.   Reviewed past medical records,treatment plan. see above for details. Will continue  Paxil to 10 mg po daily for PTSD sx. Will increase Depakote DR to 250 mg po bid and 750 mg po daily at 8 pm  for mood control. Depakote level on 11/07/15- subtherapeutic - 24.next depakote level in 11/11/15. Will continue seroquel 300 mg po qhs for psychosis/mood sx/sleep. Discontinued Strattera - could not tolerate it. Discontinued Provigil since it may be  stimulating- was discontinued during his stay at [facility] in July. Restarted home medications  where indicated. Will continue to monitor vitals ,medication compliance and treatment side effects while patient is here.  Will monitor for medical issues as well as call consult as needed. EKG reviewed - qtc wnl. CSW will continue working on disposition. Patient to participate in therapeutic milieu .   Heidemarie Goodnow, MD 11/07/2015, 3:07 PM

## 2015-11-08 MED ORDER — DIVALPROEX SODIUM 250 MG PO DR TAB
750.0000 mg | DELAYED_RELEASE_TABLET | Freq: Every day | ORAL | Status: AC
  Filled 2015-11-08: qty 3

## 2015-11-08 MED ORDER — DIVALPROEX SODIUM 500 MG PO DR TAB
1000.0000 mg | DELAYED_RELEASE_TABLET | Freq: Every day | ORAL | Status: DC
  Administered 2015-11-09 – 2015-11-10 (×2): 1000 mg via ORAL
  Filled 2015-11-08 (×4): qty 2

## 2015-11-08 MED ORDER — DIVALPROEX SODIUM 250 MG PO DR TAB
250.0000 mg | DELAYED_RELEASE_TABLET | Freq: Every day | ORAL | Status: DC
  Administered 2015-11-09 – 2015-11-11 (×3): 250 mg via ORAL
  Filled 2015-11-08 (×5): qty 1

## 2015-11-08 MED ORDER — ATOMOXETINE HCL 40 MG PO CAPS
40.0000 mg | ORAL_CAPSULE | Freq: Every day | ORAL | Status: DC
  Administered 2015-11-08 – 2015-11-11 (×4): 40 mg via ORAL
  Filled 2015-11-08 (×6): qty 1

## 2015-11-08 NOTE — Progress Notes (Signed)
Recreation Therapy Notes  Date: 11/08/15 Time: 1000 Location: 500 Hall Dayroom  Group Topic: Stress Management  Goal Area(s) Addresses:  Patient will verbalize importance of using healthy stress management.  Patient will identify positive emotions associated with healthy stress management.   Intervention: Stress Management  Activity :  Deep Breathing, [place] Guided Imagery.  LRT introduced patients to the techniques of deep breathing and guided imagery.  Patients were to follow along as LRT read scripts to participate in stress management techniques.     Education:  Stress Management, Discharge Planning.   Education Outcome:  Needs additional education  Clinical Observations/Feedback:  Pt did not attend group.   Gavan Nordby, LRT/CTRS   Aragorn Recker A 11/08/2015 12:40 PM 
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
## 2015-11-08 NOTE — Progress Notes (Signed)
Patient has been in his room for the majority of the shift.   Patient reported feeling sleepy from his last dose of seroquel.  Patient states that his depression has decreased some.  Patient was noted to be tearful when talking about his home situation.  Patient denies SI, HI, and AVH.   Assess for safety, offer medications as prescribed, engage patient in 1:1 staff talks  Continue to monitor.  Patient able to contract for safety.

## 2015-11-08 NOTE — Progress Notes (Signed)
[facility] MD Progress Note  11/08/2015 2:23 PM Joshua Davila  MRN:  [id number] Subjective: Pt states " I am depressed, I cannot keep myself awake . Being here is making me worse. You are not giving me anything that I want. I want provigil , want to try strattera again ."    Objective:Joshua Davila is a 35 year old Caucasian male, who is on SSD , has a hx of schizoaffective do , PTSD, gender dysphoria as well as polysubstance abuse , presented IVCed for disorganized behavior and suicidal thoughts.   Pt seen and chart reviewed and discussed with nursing staff.  Pt today seen as pacing the hallways , restless , anxious and avoiding eye contact. Pt per staff remains intrusive , labile, irritable and is tearful often stating " he is depressed and hopeless and lonely." Pt continues to be medication seeking - however was able to reduce the amount of PRN medications that he used yesterday. Pt reports low energy all day , inability to stay wake and requests to be started back on provigil . When it was discussed with pt that he was taken off of Provigil during his stay at [facility] last month - pt became more irritable and labile . Per staff - pt is tangential , pressured - continues to need encouragement and redirection on the unit.    Principal Problem: Schizoaffective disorder, bipolar type (HCC) Diagnosis:   Patient Active Problem List   Diagnosis Date Noted  . Polysubstance abuse [F19.10]   . Schizoaffective disorder, bipolar type (HCC) [F25.0] 10/02/2015  . Involuntary commitment [Z04.6] 10/02/2015  . Cannabis use disorder, moderate, dependence (HCC) [F12.20] 09/16/2015  . Opioid use disorder, moderate, dependence (HCC) [F11.20] 09/16/2015  . 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 Benzodiazepine dependence (HCC) [F13.20] 10/04/2013  . PTSD (post-traumatic stress disorder) [F43.10] 10/03/2013  . Polysubstance dependence (HCC) [F19.20] 10/02/2013   Total Time spent with patient: 25 minutes  Past Psychiatric History: see H&P  Past Medical History:  Past Medical History:  Diagnosis Date  . Depression   . Gender identity disorder   . Liver disorder   . PTSD (post-traumatic stress disorder)   . Schizoaffective disorder (HCC)    History reviewed. No pertinent surgical history. Family History:  Family History  Problem Relation Age of Onset  . Multiple sclerosis Mother   . Mental illness Neg Hx    Family Psychiatric  History: see H&P Social History:  History  Alcohol Use  . Yes    Comment: occasional     History  Drug Use  . Types: Cocaine, Methamphetamines, Marijuana    Comment: last useof methamphetamines and marijuana yesterday    Social History   Social History  . Marital status: Single    Spouse name: N/A  . Number of children: N/A  . Years of education: N/A   Social History Main Topics  . Smoking status: Current Every Day Smoker    Packs/day: 1.00    Types: Cigarettes  . Smokeless tobacco: Never Used  . Alcohol use Yes     Comment: occasional  . Drug use:     Types: Cocaine, Methamphetamines, Marijuana     Comment: last useof methamphetamines and marijuana yesterday  . Sexual activity: Not Currently   Other Topics Concern  . None  Social History Narrative  . None   Additional Social History:    Pain Medications: Pt denies Prescriptions: Pt cannot remember Over the Counter: Pt denies History of alcohol / drug use?: Yes Longest period of sobriety (when/how long): 50 days Negative Consequences of Use: Financial, Legal, Personal relationships Withdrawal Symptoms: Seizures, Irritability, Sweats, Tremors Onset of Seizures: Four years ago Date of most recent seizure: Four years ago Name of  Substance 1: Marijuana 1 - Age of First Use: unknown 1 - Amount (size/oz): unknown 1 - Frequency: daily 1 - Duration: ongoing 1 - Last Use / Amount: 10/29/15                  Sleep: Fair  Appetite:  Good  Current Medications: Current Facility-Administered Medications  Medication Dose Route Frequency Provider Last Rate Last Dose  . acetaminophen (TYLENOL) tablet 650 mg  650 mg Oral Q6H PRN Charm RingsJamison Y Lord, NP   650 mg at 11/08/15 0933  . alum & mag hydroxide-simeth (MAALOX/MYLANTA) 200-200-20 MG/5ML suspension 30 mL  30 mL Oral PRN Charm RingsJamison Y Lord, NP      . atomoxetine (STRATTERA) capsule 40 mg  40 mg Oral Daily Jomarie LongsSaramma Kelvis Berger, MD   40 mg at 11/08/15 1204  . atorvastatin (LIPITOR) tablet 20 mg  20 mg Oral Daily Charm RingsJamison Y Lord, NP   20 mg at 11/08/15 [id number]  . benztropine (COGENTIN) tablet 0.5 mg  0.5 mg Oral BID Jomarie LongsSaramma Jaymason Ledesma, MD   0.5 mg at 11/08/15 0811  . [START ON 11/09/2015] divalproex (DEPAKOTE) DR tablet 1,000 mg  1,000 mg Oral Q2000 Jomarie LongsSaramma Buzz Axel, MD      . Melene Muller[START ON 11/09/2015] divalproex (DEPAKOTE) DR tablet 250 mg  250 mg Oral QPC breakfast Catrena Vari, MD      . divalproex (DEPAKOTE) DR tablet 750 mg  750 mg Oral Q2000 Sten Dematteo, MD      . emtricitabine-tenofovir AF (DESCOVY) 200-25 MG per tablet 1 tablet  1 tablet Oral Daily Charm RingsJamison Y Lord, NP   1 tablet at 11/08/15 [phone]  . ibuprofen (ADVIL,MOTRIN) tablet 600 mg  600 mg Oral Q8H PRN Charm RingsJamison Y Lord, NP   600 mg at 11/05/15 1101  . lidocaine (LIDODERM) 5 % 1 patch  1 patch Transdermal Q24H Jomarie LongsSaramma Miu Chiong, MD   1 patch at 11/05/15 1553  . LORazepam (ATIVAN) tablet 1 mg  1 mg Oral Q8H PRN Charm RingsJamison Y Lord, NP   1 mg at 11/08/15 0935  . magnesium hydroxide (MILK OF MAGNESIA) suspension 30 mL  30 mL Oral Daily PRN Charm RingsJamison Y Lord, NP      . methocarbamol (ROBAXIN) tablet 500 mg  500 mg Oral Q8H PRN Jomarie LongsSaramma Bailyn Spackman, MD   500 mg at 11/07/15 1255  . nicotine polacrilex (NICORETTE) gum 2 mg  2 mg Oral PRN Charm RingsJamison Y Lord, NP   2  mg at 11/08/15 [id number]  . OLANZapine zydis (ZYPREXA) disintegrating tablet 5 mg  5 mg Oral Q8H PRN Oletta DarterSalina Agarwal, MD   5 mg at 11/06/15 1341   And  . ziprasidone (GEODON) injection 20 mg  20 mg Intramuscular PRN Oletta DarterSalina Agarwal, MD      . ondansetron [facility](ZOFRAN) tablet 4 mg  4 mg Oral Q8H PRN Charm RingsJamison Y Lord, NP      . PARoxetine (PAXIL) tablet 10 mg  10 mg Oral Daily Jomarie LongsSaramma Marai Teehan, MD   10 mg at 11/08/15 0811  . QUEtiapine (SEROQUEL) tablet 300 mg  300 mg Oral QHS Jomarie LongsSaramma Gavin Telford, MD  300 mg at 11/07/15 2017    Lab Results:  Results for orders placed or performed during the hospital encounter of 11/01/15 (from the past 48 hour(s))  Valproic acid level     Status: Abnormal   Collection Time: 11/07/15  6:43 AM  Result Value Ref Range   Valproic Acid Lvl 24 (L) 50.0 - 100.0 ug/mL    Comment: Performed at [facility]    Blood Alcohol level:  Lab Results  Component Value Date   [facility] <5 10/30/2015   ETH <5 10/01/2015    Metabolic Disorder Labs: Lab Results  Component Value Date   HGBA1C 5.1 09/17/2015   MPG 100 09/17/2015   No results found for: PROLACTIN Lab Results  Component Value Date   CHOL 191 09/17/2015   TRIG 196 (H) 09/17/2015   HDL 27 (L) 09/17/2015   CHOLHDL 7.1 09/17/2015   VLDL 39 09/17/2015   LDLCALC 125 (H) 09/17/2015    Physical Findings: AIMS: Facial and Oral Movements Muscles of Facial Expression: None, normal Lips and Perioral Area: None, normal Jaw: None, normal Tongue: None, normal,Extremity Movements Upper (arms, wrists, hands, fingers): None, normal Lower (legs, knees, ankles, toes): None, normal, Trunk Movements Neck, shoulders, hips: None, normal, Overall Severity Severity of abnormal movements (highest score from questions above): None, normal Incapacitation due to abnormal movements: None, normal Patient's awareness of abnormal movements (rate only patient's report): No Awareness, Dental Status Current problems with teeth and/or  dentures?: No Does patient usually wear dentures?: No  CIWA:    COWS:     Musculoskeletal: Strength & Muscle Tone: within normal limits Gait & Station: normal Patient leans: N/A  Psychiatric Specialty Exam: Physical Exam  Nursing note and vitals reviewed.   Review of Systems  Constitutional: Positive for malaise/fatigue.  Musculoskeletal: Positive for back pain.  Psychiatric/Behavioral: Positive for depression. The patient is nervous/anxious.   All other systems reviewed and are negative.   Blood pressure 106/65, pulse 97, temperature 97.7 F (36.5 C), temperature source Oral, resp. rate 20, height 6' (1.829 m), weight 84.4 kg (186 lb), SpO2 100 %.Body mass index is 25.23 kg/m.  General Appearance: Casual  Eye Contact:  None  Speech:  Pressured  Volume:  Normal  Mood:  Anxious, Depressed and Irritable  Affect:  Labile and Tearful  Thought Process:  Disorganized, Irrelevant and Descriptions of Associations: Loose  Orientation:  Full (Time, Place, and Person)  Thought Content:  Delusions, Paranoid Ideation and Rumination  Suicidal Thoughts:  No is paranoid and a potential danger to self or others  Homicidal Thoughts:  No  Memory:  Immediate;   Fair Recent;   Fair Remote;   Fair  Judgement:  Impaired  Insight:  Lacking  Psychomotor Activity:  Restlessness  Concentration:  Concentration: Poor  Recall:  [employer] of Knowledge:  Fair  Language:  Fair  Akathisia:  No  Handed:  Right  AIMS (if indicated):     Assets:  Housing  ADL's:  Impaired  Cognition:  WNL  Sleep:  Number of Hours: 6     Treatment Plan Summary: Daily contact with patient to assess and evaluate symptoms and progress in treatment and Medication management  Assessment /Plan : Joshua Davila is a 35 year old Caucasian male, who is on SSD , has a hx of PTSD, gender dysphoria as well as polysubstance abuse , presented IVCed for disorganized behavior and suicidal thoughts. Pt continues to be paranoid, labile  pressured and tearful . Will continue treatment.  Reviewed past medical records,treatment plan. see above for details. Will continue  Paxil 10 mg po daily for PTSD sx.Dose reduced due to patient having manic sx on admission. Will change Depakote DR  250 mg po daily and  and 1000 mg po daily at 8 pm  for mood control. Depakote level on 11/07/15- subtherapeutic - 24.next depakote level in 11/11/15.Dosing schedule changed due to patient being lethargic all day. Will continue seroquel 300 mg po qhs for psychosis/mood sx/sleep. Will add Strattera 40 mg po today for attention issues - pt reports he wants to be back on it. Will discontinue Trazodone 50 mg po qhs prn - since patient os drowsy all day. Discontinued Provigil since it may be stimulating- [facility]. Restarted home medications where indicated. Will continue to monitor vitals ,medication compliance and treatment side effects while patient is here.  Will monitor for medical issues as well as call consult as needed. EKG reviewed - qtc wnl. CSW will continue working on disposition. Patient to participate in therapeutic milieu .   Joshua Marty, MD 11/08/2015, 2:23 PM

## 2015-11-08 NOTE — Progress Notes (Signed)
Did not attend group 

## 2015-11-08 NOTE — Progress Notes (Signed)
   D: Pt approached the writer stating, "I don't have any friends. Joshua Davila are my friends while I'm in here." pt was tearful while he was talking to the Clinical research associatewriter. Stated, "I'm depressed".  Pt discussed that he's ok if he only has to come every 5 yrs or longer. But wants to get on the right medications. Pt has no other questions or concerns.  A:  Support and encouragement was offered. 15 min checks continued for safety.  R: Pt remains safe.

## 2015-11-08 NOTE — Progress Notes (Signed)
Patient ID: Joshua GrimesJames B Callander, male   DOB: 05-05-1980, 35 y.o.   MRN: [id number] PER STATE REGULATIONS 482.30  THIS CHART WAS REVIEWED FOR MEDICAL NECESSITY WITH RESPECT TO THE PATIENT'S ADMISSION/ DURATION OF STAY.  NEXT REVIEW DATE: 11/13/2015  Willa RoughJENNIFER JONES Kerby Hockley, RN, BSN CASE MANAGER'

## 2015-11-09 MED ORDER — QUETIAPINE FUMARATE 200 MG PO TABS
200.0000 mg | ORAL_TABLET | Freq: Every day | ORAL | Status: DC
  Administered 2015-11-09: 200 mg via ORAL
  Filled 2015-11-09 (×3): qty 1

## 2015-11-09 NOTE — [facility] Group Notes (Signed)
[facility] Group Notes:  (Nursing/MHT/Case Management/Adjunct)  Date:  11/09/2015  Time:  2:48 PM  Type of Therapy:  Nurse Education  Participation Level:  Active  Participation Quality:  Appropriate and Attentive  Affect:  Appropriate  Cognitive:  Alert and Appropriate  Insight:  Appropriate and Good  Engagement in Group:  Engaged  Modes of Intervention:  Discussion and Education  Summary of Progress/Problems: Topic was on healthy coping skills.  Discussed and encouraged group to learn new coping skills while in the hospital.  Group also encouraged to replace negative coping skills with new positive coping skills.  Patient was attentive and receptive.  Joshua Davila 11/09/2015, 2:48 PM

## 2015-11-09 NOTE — Progress Notes (Signed)
DAR NOTE: Patient presents with anxious affect and depressed mood.  Denies pain, auditory and visual hallucinations.  Rates depression at 7.5, hopelessness at 6, and anxiety at 0.  Maintained on routine safety checks.  Medications given as prescribed.  Support and encouragement offered as needed.  Attended group and participated.  States goal for today is "dismissal."  Patient observed socializing with peers in the dayroom.  Patient requested and received Ativan 1 mg for anxiety and Tylenol 650 mg for pain with good effect.  .Marland Kitchen

## 2015-11-09 NOTE — Progress Notes (Signed)
[facility]  11/09/2015 12:51 PM Joshua Davila  MRN:  [id number] Subjective: Pt states " I am depressed, I do not want to take some of those medications which I should not be on.'     Objective:Joshua Davila is a 35 year old Caucasian male, who is on SSD , has a hx of schizoaffective do , PTSD, gender dysphoria as well as polysubstance abuse , presented IVCed for disorganized behavior and suicidal thoughts.   Pt seen and chart reviewed and discussed with nursing staff.  Pt today seen as less restless , less drowsy . Pt reports that he refused his seroquel last night since that was making him drowsy - discussed reducing the dose . Pt continues to be medication seeking - states he wants Vyvanse or provigil. Pt denies other concerns. Per staff - pt is demanding , restless on and off - continues to need encouragement and redirection on the unit.    Principal Problem: Schizoaffective disorder, bipolar type (HCC) Diagnosis:   Patient Active Problem List   Diagnosis Date Noted  . Polysubstance abuse [F19.10]   . Schizoaffective disorder, bipolar type (HCC) [F25.0] 10/02/2015  . Involuntary commitment [Z04.6] 10/02/2015  . Cannabis use disorder, moderate, dependence (HCC) [F12.20] 09/16/2015  . Opioid use disorder, moderate, dependence (HCC) [F11.20] 09/16/2015  . 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 Benzodiazepine dependence (HCC) [F13.20] 10/04/2013  . PTSD (post-traumatic stress disorder) [F43.10] 10/03/2013  . Polysubstance dependence (HCC) [F19.20] 10/02/2013   Total Time spent with patient: 20 minutes  Past Psychiatric History: see H&P  Past Medical History:  Past Medical History:  Diagnosis Date  . Depression   .  Gender identity disorder   . Liver disorder   . PTSD (post-traumatic stress disorder)   . Schizoaffective disorder (HCC)    History reviewed. No pertinent surgical history. Family History:  Family History  Problem Relation Age of Onset  . Multiple sclerosis Mother   . Mental illness Neg Hx    Family Psychiatric  History: see H&P Social History:  History  Alcohol Use  . Yes    Comment: occasional     History  Drug Use  . Types: Cocaine, Methamphetamines, Marijuana    Comment: last useof methamphetamines and marijuana yesterday    Social History   Social History  . Marital status: Single    Spouse name: N/A  . Number of children: N/A  . Years of education: N/A   Social History Main Topics  . Smoking status: Current Every Day Smoker    Packs/day: 1.00    Types: Cigarettes  . Smokeless tobacco: Never Used  . Alcohol use Yes     Comment: occasional  . Drug use:     Types: Cocaine, Methamphetamines, Marijuana     Comment: last useof methamphetamines and marijuana yesterday  . Sexual activity: Not Currently   Other Topics Concern  . None   Social History Narrative  . None   Additional Social History:    Pain Medications: Pt denies Prescriptions: Pt cannot remember Over the Counter: Pt denies History of alcohol / drug use?: Yes Longest period of sobriety (when/how long): 50 days Negative Consequences of Use: Financial, Legal, Personal relationships Withdrawal Symptoms: Seizures, Irritability, Sweats, Tremors Onset of Seizures: Four years ago Date of most recent seizure: Four  years ago Name of Substance 1: Marijuana 1 - Age of First Use: unknown 1 - Amount (size/oz): unknown 1 - Frequency: daily 1 - Duration: ongoing 1 - Last Use / Amount: 10/29/15                  Sleep: Fair  Appetite:  Good  Current Medications: Current Facility-Administered Medications  Medication Dose Route Frequency Provider Last Rate Last Dose  . acetaminophen (TYLENOL)  tablet 650 mg  650 mg Oral Q6H PRN Charm RingsJamison Y Lord, NP   650 mg at 11/08/15 1832  . alum & mag hydroxide-simeth (MAALOX/MYLANTA) 200-200-20 MG/5ML suspension 30 mL  30 mL Oral PRN Charm RingsJamison Y Lord, NP      . atomoxetine (STRATTERA) capsule 40 mg  40 mg Oral Daily Jomarie LongsSaramma Guilford Shannahan, MD   40 mg at 11/09/15 0904  . atorvastatin (LIPITOR) tablet 20 mg  20 mg Oral Daily Charm RingsJamison Y Lord, NP   20 mg at 11/09/15 0904  . benztropine (COGENTIN) tablet 0.5 mg  0.5 mg Oral BID Jomarie LongsSaramma Emerlyn Mehlhoff, MD   0.5 mg at 11/09/15 0904  . divalproex (DEPAKOTE) DR tablet 1,000 mg  1,000 mg Oral Q2000 Pepper Kerrick, MD      . divalproex (DEPAKOTE) DR tablet 250 mg  250 mg Oral QPC breakfast Jomarie LongsSaramma Marcelene Weidemann, MD   250 mg at 11/09/15 0904  . divalproex (DEPAKOTE) DR tablet 750 mg  750 mg Oral Q2000 Arneshia Ade, MD      . emtricitabine-tenofovir AF (DESCOVY) 200-25 MG per tablet 1 tablet  1 tablet Oral Daily Charm RingsJamison Y Lord, NP   1 tablet at 11/09/15 0903  . ibuprofen (ADVIL,MOTRIN) tablet 600 mg  600 mg Oral Q8H PRN Charm RingsJamison Y Lord, NP   600 mg at 11/05/15 1101  . lidocaine (LIDODERM) 5 % 1 patch  1 patch Transdermal Q24H Jomarie LongsSaramma Jonika Critz, MD   1 patch at 11/05/15 1553  . LORazepam (ATIVAN) tablet 1 mg  1 mg Oral Q8H PRN Charm RingsJamison Y Lord, NP   1 mg at 11/08/15 1832  . magnesium hydroxide (MILK OF MAGNESIA) suspension 30 mL  30 mL Oral Daily PRN Charm RingsJamison Y Lord, NP      . methocarbamol (ROBAXIN) tablet 500 mg  500 mg Oral Q8H PRN Jomarie LongsSaramma Daleen Steinhaus, MD   500 mg at 11/08/15 1832  . nicotine polacrilex (NICORETTE) gum 2 mg  2 mg Oral PRN Charm RingsJamison Y Lord, NP   2 mg at 11/09/15 0907  . OLANZapine zydis (ZYPREXA) disintegrating tablet 5 mg  5 mg Oral Q8H PRN Oletta DarterSalina Agarwal, MD   5 mg at 11/06/15 1341   And  . ziprasidone (GEODON) injection 20 mg  20 mg Intramuscular PRN Oletta DarterSalina Agarwal, MD      . ondansetron [facility](ZOFRAN) tablet 4 mg  4 mg Oral Q8H PRN Charm RingsJamison Y Lord, NP      . PARoxetine (PAXIL) tablet 10 mg  10 mg Oral Daily Jomarie LongsSaramma Dangelo Guzzetta, MD   10 mg at  11/09/15 0904  . QUEtiapine (SEROQUEL) tablet 300 mg  300 mg Oral QHS Jomarie LongsSaramma Madalee Altmann, MD   300 mg at 11/07/15 2017    Lab Results:  No results found for this or any previous visit (from the past 48 hour(s)).  Blood Alcohol level:  Lab Results  Component Value Date   [facility] 10/30[facility]2015   ETH <5 10/01/2015    Metabolic Disorder Labs: Lab Results  Component Value Date   HGBA1C 5.1 09/17/2015   MPG 100 09/17/2015  No results found for: PROLACTIN Lab Results  Component Value Date   CHOL 191 09/17/2015   TRIG 196 (H) 09/17/2015   HDL 27 (L) 09/17/2015   CHOLHDL 7.1 09/17/2015   VLDL 39 09/17/2015   LDLCALC 125 (H) 09/17/2015    Physical Findings: AIMS: Facial and Oral Movements Muscles of Facial Expression: None, normal Lips and Perioral Area: None, normal Jaw: None, normal Tongue: None, normal,Extremity Movements Upper (arms, wrists, hands, fingers): None, normal Lower (legs, knees, ankles, toes): None, normal, Trunk Movements Neck, shoulders, hips: None, normal, Overall Severity Severity of abnormal movements (highest score from questions above): None, normal Incapacitation due to abnormal movements: None, normal Patient's awareness of abnormal movements (rate only patient's report): No Awareness, Dental Status Current problems with teeth and/or dentures?: No Does patient usually wear dentures?: No  CIWA:    COWS:     Musculoskeletal: Strength & Muscle Tone: within normal limits Gait & Station: normal Patient leans: N/A  Psychiatric Specialty Exam: Physical Exam  Nursing note and vitals reviewed.   Review of Systems  Constitutional: Positive for malaise/fatigue.  Musculoskeletal: Positive for back pain.  Psychiatric/Behavioral: Positive for depression. The patient is nervous/anxious.   All other systems reviewed and are negative.   Blood pressure 102/68, pulse 82, temperature 97.9 F (36.6 C), temperature source Oral, resp. rate 18, height 6' (1.829 m),  weight 84.4 kg (186 lb), SpO2 100 %.Body mass index is 25.23 kg/m.  General Appearance: Casual  Eye Contact:  None  Speech:  Pressured  Volume:  Normal  Mood:  Anxious, Depressed and Irritable improving  Affect:  Labile  Thought Process:  Linear and Descriptions of Associations: Circumstantial improving  Orientation:  Full (Time, Place, and Person)  Thought Content:  Delusions, Paranoid Ideation and Rumination  Suicidal Thoughts:  No is paranoid and a potential danger to self or others  Homicidal Thoughts:  No  Memory:  Immediate;   Fair Recent;   Fair Remote;   Fair  Judgement:  Impaired  Insight:  Lacking  Psychomotor Activity:  Restlessness  Concentration:  Concentration: Fair and Attention Span: Fair  Recall:  [employer] of Knowledge:  Fair  Language:  Fair  Akathisia:  No  Handed:  Right  AIMS (if indicated):     Assets:  Housing  ADL's:  Impaired  Cognition:  WNL  Sleep:  Number of Hours: 6.75     Treatment Plan Summary: Daily contact with patient to assess and evaluate symptoms and progress in treatment and Medication management  Assessment /Plan : Teige is a 35 year old Caucasian male, who is on SSD , has a hx of PTSD, gender dysphoria as well as polysubstance abuse , presented IVCed for disorganized behavior and suicidal thoughts. Pt continues to be paranoid,although is less labile and less anxious . Will continue treatment.   Reviewed past medical records,treatment plan. see above for details. Will continue  Paxil 10 mg po daily for PTSD sx.Dose reduced due to patient having manic sx on admission. Continue Depakote DR  250 mg po daily and  and 1000 mg po daily at 8 pm  for mood control. Depakote level on 11/07/15- subtherapeutic - 24.next depakote level in 11/11/15.Dosing schedule changed due to patient being lethargic all day. Will reduce seroquel to 200 mg po qhs for psychosis/mood sx/sleep. Will continue Strattera 40 mg po today for attention issues - pt reports  he wants to be back on it. Will discontinue Trazodone 50 mg po qhs prn - since  patient os drowsy all day. Discontinued Provigil since it may be stimulating- was discontinued during his stay at [facility] in July. Restarted home medications where indicated. Will continue to monitor vitals ,medication compliance and treatment side effects while patient is here.  Will monitor for medical issues as well as call consult as needed. EKG reviewed - qtc wnl. CSW will continue working on disposition. Patient to participate in therapeutic milieu .   Kendall Arnell, MD 11/09/2015, 12:51 PM

## 2015-11-09 NOTE — [facility] Group Notes (Signed)
[facility] Group Notes:  (Clinical Social Work)  11/09/2015    Summary of Progress/Problems:   Today's process group involved patients discussing their feelings related to being hospitalized, as well as how they can use their present feelings to create a plan that could help them avoid future hospitalizations such as this one. The patient expressed the primary feeling about being hospitalized is "happy since I'm about to be released."  He talked about the big team of [facility] between [facility] who have worked together to get a plan and medications together.  Type of Therapy:  Group Therapy - Process  Participation Level:  Active  Participation Quality:  Appropriate and Sharing  Affect:  Blunted  Cognitive:  Appropriate  Insight:  Developing/Improving  Engagement in Therapy:  Developing/Improving  Modes of Intervention:  Exploration, Discussion  Ambrose MantleMareida Grossman-Orr, LCSW 11/09/2015, 2:02 PM

## 2015-11-10 MED ORDER — ZIPRASIDONE HCL 20 MG PO CAPS
20.0000 mg | ORAL_CAPSULE | Freq: Every day | ORAL | Status: DC
  Administered 2015-11-10: 20 mg via ORAL
  Filled 2015-11-10 (×3): qty 1

## 2015-11-10 MED ORDER — TRAZODONE HCL 50 MG PO TABS: 50.0000 mg | ORAL_TABLET | Freq: Every evening | ORAL | Status: DC | PRN

## 2015-11-10 NOTE — Progress Notes (Signed)
DAR NOTE Pt has been seen in the hall way most of the time. Pt stated that he wants to be discharged today. Pt was told but the doctor told him to stay one more day since his medications were adjusted. Pt's affect is brighter,calm and cooperative with his medications. Pt states he slept well last night, rates his depression at 3, hopelessness at 0, and anxiety at 0. Pt's safety ensured with 15 minute and environmental checks. Pt currently denies SI/HI and A/V hallucinations. Pt verbally agrees to seek staff if SI/HI or A/VH occurs and to consult with staff before acting on any harmful thoughts. Will continue POC.

## 2015-11-10 NOTE — [facility] Group Notes (Signed)

## 2015-11-10 NOTE — Progress Notes (Signed)
[facility]  11/10/2015 11:37 AM Joshua Davila  MRN:  [id number] Subjective: Pt states "Seroquel is still making me too groggy , I want another medication. '     Objective:Joshua Davila is a 35 year old Caucasian male, who is on SSD , has a hx of schizoaffective do , PTSD, gender dysphoria as well as polysubstance abuse , presented IVCed for disorganized behavior and suicidal thoughts.   Pt seen and chart reviewed and discussed with nursing staff.  Pt today seen as drowsy , but less restless than previous days. Pt continues to go back and forth about not wanting seroquel - he states he has tried abilify, haldol, risperidone, trilafon, invega, zyprexa,thorazine - does not want to be on any of these medications. Is willing to try Geodon - will start geodon today with supper. Per staff - pt is demanding often , restless on and off , however is making progress.    Principal Problem: Schizoaffective disorder, bipolar type (HCC) Diagnosis:   Patient Active Problem List   Diagnosis Date Noted  . Polysubstance abuse [F19.10]   . Schizoaffective disorder, bipolar type (HCC) [F25.0] 10/02/2015  . Involuntary commitment [Z04.6] 10/02/2015  . Cannabis use disorder, moderate, dependence (HCC) [F12.20] 09/16/2015  . Opioid use disorder, moderate, dependence (HCC) [F11.20] 09/16/2015  . 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 Benzodiazepine dependence (HCC) [F13.20] 10/04/2013  . PTSD (post-traumatic stress disorder) [F43.10] 10/03/2013  . Polysubstance dependence (HCC) [F19.20] 10/02/2013   Total Time spent with patient: 25 minutes  Past Psychiatric History: see H&P  Past Medical History:  Past Medical History:  Diagnosis Date  .  Depression   . Gender identity disorder   . Liver disorder   . PTSD (post-traumatic stress disorder)   . Schizoaffective disorder (HCC)    History reviewed. No pertinent surgical history. Family History:  Family History  Problem Relation Age of Onset  . Multiple sclerosis Mother   . Mental illness Neg Hx    Family Psychiatric  History: see H&P Social History:  History  Alcohol Use  . Yes    Comment: occasional     History  Drug Use  . Types: Cocaine, Methamphetamines, Marijuana    Comment: last useof methamphetamines and marijuana yesterday    Social History   Social History  . Marital status: Single    Spouse name: N/A  . Number of children: N/A  . Years of education: N/A   Social History Main Topics  . Smoking status: Current Every Day Smoker    Packs/day: 1.00    Types: Cigarettes  . Smokeless tobacco: Never Used  . Alcohol use Yes     Comment: occasional  . Drug use:     Types: Cocaine, Methamphetamines, Marijuana     Comment: last useof methamphetamines and marijuana yesterday  . Sexual activity: Not Currently   Other Topics Concern  . None   Social History Narrative  . None   Additional Social History:    Pain Medications: Pt denies Prescriptions: Pt cannot remember Over the Counter: Pt denies History of alcohol / drug use?: Yes Longest period of sobriety (when/how long): 50 days Negative Consequences of Use: Financial, Legal, Personal relationships Withdrawal Symptoms: Seizures, Irritability, Sweats, Tremors Onset of Seizures: Four years ago Date of most recent seizure: Four years  ago Name of Substance 1: Marijuana 1 - Age of First Use: unknown 1 - Amount (size/oz): unknown 1 - Frequency: daily 1 - Duration: ongoing 1 - Last Use / Amount: 10/29/15                  Sleep: Fair  Appetite:  Good  Current Medications: Current Facility-Administered Medications  Medication Dose Route Frequency Provider Last Rate Last Dose  .  acetaminophen (TYLENOL) tablet 650 mg  650 mg Oral Q6H PRN Charm Rings, NP   650 mg at 11/09/15 1546  . alum & mag hydroxide-simeth (MAALOX/MYLANTA) 200-200-20 MG/5ML suspension 30 mL  30 mL Oral PRN Charm Rings, NP      . atomoxetine (STRATTERA) capsule 40 mg  40 mg Oral Daily Jomarie Longs, MD   40 mg at 11/10/15 0834  . atorvastatin (LIPITOR) tablet 20 mg  20 mg Oral Daily Charm Rings, NP   20 mg at 11/10/15 1610  . benztropine (COGENTIN) tablet 0.5 mg  0.5 mg Oral BID Jomarie Longs, MD   0.5 mg at 11/10/15 9604  . divalproex (DEPAKOTE) DR tablet 1,000 mg  1,000 mg Oral Q2000 Suhayla Chisom, MD   1,000 mg at 11/09/15 2050  . divalproex (DEPAKOTE) DR tablet 250 mg  250 mg Oral QPC breakfast Jomarie Longs, MD   250 mg at 11/10/15 5409  . emtricitabine-tenofovir AF (DESCOVY) 200-25 MG per tablet 1 tablet  1 tablet Oral Daily Charm Rings, NP   1 tablet at 11/10/15 [phone]  . ibuprofen (ADVIL,MOTRIN) tablet 600 mg  600 mg Oral Q8H PRN Charm Rings, NP   600 mg at 11/10/15 1030  . lidocaine (LIDODERM) 5 % 1 patch  1 patch Transdermal Q24H Jomarie Longs, MD   1 patch at 11/05/15 1553  . LORazepam (ATIVAN) tablet 1 mg  1 mg Oral Q8H PRN Charm Rings, NP   1 mg at 11/09/15 1552  . magnesium hydroxide (MILK OF MAGNESIA) suspension 30 mL  30 mL Oral Daily PRN Charm Rings, NP      . methocarbamol (ROBAXIN) tablet 500 mg  500 mg Oral Q8H PRN Jomarie Longs, MD   500 mg at 11/08/15 1832  . nicotine polacrilex (NICORETTE) gum 2 mg  2 mg Oral PRN Charm Rings, NP   2 mg at 11/10/15 1034  . OLANZapine zydis (ZYPREXA) disintegrating tablet 5 mg  5 mg Oral Q8H PRN Oletta Darter, MD   5 mg at 11/10/15 1031   And  . ziprasidone (GEODON) injection 20 mg  20 mg Intramuscular PRN Oletta Darter, MD      . ondansetron [facility]) tablet 4 mg  4 mg Oral Q8H PRN Charm Rings, NP      . PARoxetine (PAXIL) tablet 10 mg  10 mg Oral Daily Jomarie Longs, MD   10 mg at 11/10/15 1478  . ziprasidone  (GEODON) capsule 20 mg  20 mg Oral Q supper Jomarie Longs, MD        Lab Results:  No results found for this or any previous visit (from the past 48 hour(s)).  Blood Alcohol level:  Lab Results  Component Value Date   [facility] <5 10/30/2015   ETH <5 10/01/2015    Metabolic Disorder Labs: Lab Results  Component Value Date   HGBA1C 5.1 09/17/2015   MPG 100 09/17/2015   No results found for: PROLACTIN Lab Results  Component Value Date   CHOL 191 09/17/2015   TRIG  196 (H) 09/17/2015   HDL 27 (L) 09/17/2015   CHOLHDL 7.1 09/17/2015   VLDL 39 09/17/2015   LDLCALC 125 (H) 09/17/2015    Physical Findings: AIMS: Facial and Oral Movements Muscles of Facial Expression: None, normal Lips and Perioral Area: None, normal Jaw: None, normal Tongue: None, normal,Extremity Movements Upper (arms, wrists, hands, fingers): None, normal Lower (legs, knees, ankles, toes): None, normal, Trunk Movements Neck, shoulders, hips: None, normal, Overall Severity Severity of abnormal movements (highest score from questions above): None, normal Incapacitation due to abnormal movements: None, normal Patient's awareness of abnormal movements (rate only patient's report): No Awareness, Dental Status Current problems with teeth and/or dentures?: No Does patient usually wear dentures?: No  CIWA:    COWS:     Musculoskeletal: Strength & Muscle Tone: within normal limits Gait & Station: normal Patient leans: N/A  Psychiatric Specialty Exam: Physical Exam  Nursing note and vitals reviewed.   Review of Systems  Constitutional: Positive for malaise/fatigue.  Musculoskeletal: Positive for back pain.  Psychiatric/Behavioral: Positive for depression. The patient is nervous/anxious.   All other systems reviewed and are negative.   Blood pressure 112/63, pulse 89, temperature 97.8 F (36.6 C), temperature source Oral, resp. rate 18, height 6' (1.829 m), weight 84.4 kg (186 lb), SpO2 100 %.Body mass index  is 25.23 kg/m.  General Appearance: Casual  Eye Contact:  None  Speech:  Pressured  Volume:  Normal  Mood:  Anxious, Depressed and Irritable improving  Affect:  Labile  Thought Process:  Linear and Descriptions of Associations: Circumstantial improving  Orientation:  Full (Time, Place, and Person)  Thought Content:  Delusions, Paranoid Ideation and Rumination  Suicidal Thoughts:  No is paranoid and a potential danger to self or others- improving  Homicidal Thoughts:  No  Memory:  Immediate;   Fair Recent;   Fair Remote;   Fair  Judgement:  Impaired  Insight:  Lacking  Psychomotor Activity:  Restlessness  Concentration:  Concentration: Fair and Attention Span: Fair  Recall:  [employer]  Fund of Knowledge:  Fair  Language:  Fair  Akathisia:  No  Handed:  Right  AIMS (if indicated):     Assets:  Housing  ADL's:  Impaired  Cognition:  WNL  Sleep:  Number of Hours: 6.75     Treatment Plan Summary: Daily contact with patient to assess and evaluate symptoms and progress in treatment and Medication management  Assessment /Plan : Joshua Davila is a 35 year old Caucasian male, who is on SSD , has a hx of PTSD, gender dysphoria as well as polysubstance abuse , presented IVCed for disorganized behavior and suicidal thoughts. Pt continues to be drowsy , restless. .Pt with ADRs to seroquel - will change to Geodon. Will continue treatment.   Reviewed past medical records,treatment plan. see above for details. Will continue  Paxil 10 mg po daily for PTSD sx.Dose reduced due to patient having manic sx on admission. Continue Depakote DR  250 mg po daily and  and 1000 mg po daily at 8 pm  for mood control. Depakote level on 11/07/15- subtherapeutic - 24.next depakote level in 11/11/15.Dosing schedule changed due to patient being lethargic all day. Will discontinue Seroquel for ADRs - will start Geodon 20 mg po with supper  for psychosis/mood sx. Will continue Strattera 40 mg po today for attention issues -  pt reports he wants to be back on it. Will add Trazodone 50 mg po qhs prn for sleep since seroquel is being discontinued today.  Discontinued Provigil since it may be stimulating- was discontinued during his stay at [facility] in July. Restarted home medications where indicated. Will continue to monitor vitals ,medication compliance and treatment side effects while patient is here.  Will monitor for medical issues as well as call consult as needed. EKG reviewed - qtc wnl. CSW will continue working on disposition. Patient to participate in therapeutic milieu .   Joshua Delancey, MD 11/10/2015, 11:37 AM

## 2015-11-10 NOTE — Progress Notes (Signed)
Writer has observed patient up in the dayroom watching tv and interacting with select peers. He reports having had a good day and writer informed him of scheduled medications. He reports that they keep switching up my medicines because he has been sleeping more during the day and not at night time. He is looing forward to discharge and reports that he will be taking his partner out for his birthday. He was compliant with his medications and denies si/hi/a/v hallucinations. Support and encouragement givne and safety maintained on unit with 15 min checks.

## 2015-11-10 NOTE — Progress Notes (Signed)
Adult Psychoeducational Group Note  Date:  11/10/2015 Time:  9:04 PM  Group Topic/Focus:  Wrap-Up Group:   The focus of this group is to help patients review their daily goal of treatment and discuss progress on daily workbooks.   Participation Level:  Active  Participation Quality:  Appropriate and Attentive  Affect:  Appropriate  Cognitive:  Appropriate  Insight: Appropriate  Engagement in Group:  Engaged  Modes of Intervention:  Discussion  Additional Comments:  Pt stated his goal for today was to have discernment, to help understand himself and to know what he can let go of, pt is still working towards this goal. Pt was encouraged to make his needs known to staff.   Caswell CorwinOwen, Estefany Goebel C 11/10/2015, 9:04 PM

## 2015-11-10 NOTE — Progress Notes (Signed)
Patient has been up briefly in the dayroom watching tv. He attended group this evening and has voiced no complaints. He is looking forward to discharging on tomorrow. He was compliant with scheduled medication. Patient currently denies having pain, -si/hi/a/v hall. Support and encouragement offered, safety maintained on unit, will continue to monitor.

## 2015-11-10 NOTE — [facility] Group Notes (Signed)
[facility] Group Notes:  (Clinical Social Work)  11/10/2015  11:00AM-12:00PM  Summary of Progress/Problems:  The main focus of today's process group was to listen to a variety of genres of music and to identify that different types of music provoke different responses.  The patient then was able to identify personally what was soothing for them, as well as energizing, as well as how patient can personally use this knowledge in sleep habits, with depression, and with other symptoms.  The patient expressed at the beginning of group the overall feeling of happy, and was smiling a lot.  He still remained tangential and only stayed in group until his song request was played.    Type of Therapy:  Music Therapy   Participation Level:  Active  Participation Quality:  Attentive and Sharing  Affect:  Blunted  Cognitive:  Disorganized  Insight:  Improving  Engagement in Therapy:  Improving   Modes of Intervention:   Activity, Exploration  Joshua MantleMareida Grossman-Orr, LCSW 11/10/2015

## 2015-11-11 LAB — VALPROIC ACID LEVEL: Valproic Acid Lvl: 49 ug/mL — ABNORMAL LOW (ref 50.0–100.0)

## 2015-11-11 MED ORDER — BENZTROPINE MESYLATE 0.5 MG PO TABS: 0.5000 mg | ORAL_TABLET | Freq: Two times a day (BID) | ORAL | 0 refills | Status: DC

## 2015-11-11 MED ORDER — IBUPROFEN 600 MG PO TABS: 600.0000 mg | ORAL_TABLET | Freq: Three times a day (TID) | ORAL | 0 refills | Status: DC | PRN

## 2015-11-11 MED ORDER — TRAZODONE HCL 50 MG PO TABS: 50.0000 mg | ORAL_TABLET | Freq: Every evening | ORAL | 0 refills | Status: DC | PRN

## 2015-11-11 MED ORDER — ALUM & MAG HYDROXIDE-SIMETH 200-200-20 MG/5ML PO SUSP: 30.0000 mL | ORAL | 0 refills | Status: DC | PRN

## 2015-11-11 MED ORDER — ACETAMINOPHEN 325 MG PO TABS: 650.0000 mg | ORAL_TABLET | ORAL | Status: DC | PRN

## 2015-11-11 MED ORDER — EMTRICITABINE-TENOFOVIR DF 200-300 MG PO TABS: 1.0000 | ORAL_TABLET | Freq: Every day | ORAL | Status: DC

## 2015-11-11 MED ORDER — ATORVASTATIN CALCIUM 20 MG PO TABS: 20.0000 mg | ORAL_TABLET | Freq: Every day | ORAL | 0 refills | Status: DC

## 2015-11-11 MED ORDER — NICOTINE POLACRILEX 2 MG MT GUM: 2.0000 mg | CHEWING_GUM | OROMUCOSAL | 0 refills | Status: DC | PRN

## 2015-11-11 MED ORDER — DIVALPROEX SODIUM 500 MG PO DR TAB: 1000.0000 mg | DELAYED_RELEASE_TABLET | Freq: Every day | ORAL | 0 refills | Status: DC

## 2015-11-11 MED ORDER — ZIPRASIDONE HCL 20 MG PO CAPS: 20.0000 mg | ORAL_CAPSULE | Freq: Every day | ORAL | 0 refills | Status: DC

## 2015-11-11 MED ORDER — METHOCARBAMOL 500 MG PO TABS: 500.0000 mg | ORAL_TABLET | Freq: Three times a day (TID) | ORAL | 0 refills | Status: DC | PRN

## 2015-11-11 MED ORDER — ATOMOXETINE HCL 40 MG PO CAPS: 40.0000 mg | ORAL_CAPSULE | Freq: Every day | ORAL | 0 refills | Status: DC

## 2015-11-11 MED ORDER — LORAZEPAM 1 MG PO TABS: 1.0000 mg | ORAL_TABLET | Freq: Three times a day (TID) | ORAL | 0 refills | Status: DC | PRN

## 2015-11-11 MED ORDER — DIVALPROEX SODIUM 250 MG PO DR TAB: 250.0000 mg | DELAYED_RELEASE_TABLET | Freq: Every day | ORAL | 0 refills | Status: DC

## 2015-11-11 MED ORDER — LIDOCAINE 5 % EX PTCH: 1.0000 | MEDICATED_PATCH | CUTANEOUS | 0 refills | Status: DC

## 2015-11-11 MED ORDER — ONDANSETRON HCL 4 MG PO TABS: 4.0000 mg | ORAL_TABLET | Freq: Three times a day (TID) | ORAL | 0 refills | Status: DC | PRN

## 2015-11-11 NOTE — Progress Notes (Signed)
Pt d/c from the hospital. All items returned. D/C instructions given with teach back and prescriptions given. Pt denies si and hi.

## 2015-11-11 NOTE — Progress Notes (Signed)
  [facility] Discharge [facility] :  Will you be returning to the same living situation after discharge:  Yes,  home At discharge, do you have transportation home?: Yes,  friend Do you have the ability to pay for your medications: Yes,  MCR  Release of information consent forms completed and in the chart;  Patient's signature needed at discharge.  Patient to Follow up at: Follow-up Information    [facility] .   Specialty:  Behavioral Health Why:  Go to the walk-in clinic M-F between 8 and 11AM for your hospital follow up appointment Contact information: [address] [place] [address] [phone]           Next level of care provider has access to [facility] Link:no  Safety Planning and Suicide Prevention discussed: Yes,  yes  Have you used any form of tobacco in the last 30 days? (Cigarettes, Smokeless Tobacco, Cigars, and/or Pipes): Yes  Has patient been referred to the Quitline?: Patient refused referral  Patient has been referred for addiction treatment: Pt. refused referral  Ida RogueRodney B Lanee Chain 11/11/2015, 11:14 AM

## 2015-11-11 NOTE — Progress Notes (Signed)
Recreation Therapy Notes  Date: 11/11/15 Time: 1030 Location: [address] Dayroom  Group Topic: Wellness  Goal Area(s) Addresses:  Patient will define components of whole wellness. Patient will verbalize benefit of whole wellness.  Behavioral Response: Engaged  Intervention: Dry Erase Board, Dry Erase Marker  Activity: Wellness Pictionary. LRT put various activities on paper. LRT folded the activities and put them into a cup. Patients were to pull a piece of paper from the cup and draw their activity on the board. The remaining patients were to guess the activity. The first patient to guess the activity, would get the next turn.  Education: Wellness, Building control surveyorDischarge Planning.   Education Outcome: Acknowledges education/In group clarification offered/Needs additional education.   Clinical Observations/Feedback:  Pt was attentive and engaged.  Pt was also a little anxious but able to focus and participate in group.   Caroll RancherMarjette Storm Sovine, LRT/CTRS   Caroll RancherLindsay, Graylen Noboa A 11/11/2015 12:33 PM
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
## 2015-11-11 NOTE — Discharge Summary (Signed)
Physician Discharge Summary Note  Patient:  Joshua Davila is an 35 y.o., male MRN:  [id number] DOB:  1980-04-07 Patient phone:  [phone] (home)  Patient address:   [address] [place] [address],  Total Time spent with patient: 30 minutes  Date of Admission:  11/01/2015  Date of Discharge: 11/11/2015  Reason for Admission: Disorganized behavior & suicidal thoughts.  Principal Problem: Schizoaffective disorder, bipolar type [facility](HCC)  Discharge Diagnoses: Patient Active Problem List   Diagnosis Date Noted  . Polysubstance abuse [F19.10]   . Schizoaffective disorder, bipolar type (HCC) [F25.0] 10/02/2015  . Involuntary commitment [Z04.6] 10/02/2015  . Cannabis use disorder, moderate, dependence (HCC) [F12.20] 09/16/2015  . Opioid use disorder, moderate, dependence (HCC) [F11.20] 09/16/2015  . 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 Benzodiazepine dependence (HCC) [F13.20] 10/04/2013  . PTSD (post-traumatic stress disorder) [F43.10] 10/03/2013  . Polysubstance dependence [facility](HCC) [F19.20] 10/02/2013   Past Psychiatric History: Polysubstance abuse/dependence  Past Medical History:  Past Medical History:  Diagnosis Date  . Depression   . Gender identity disorder   . Liver disorder   . PTSD (post-traumatic stress disorder)   . Schizoaffective disorder (HCC)    History reviewed. No pertinent surgical history.  Family History:  Family History  Problem Relation Age of Onset  . Multiple sclerosis Mother   . Mental illness Neg Hx    Family Psychiatric  History: See H&P  Social History:  History  Alcohol Use  . Yes    Comment: occasional     History  Drug Use  . Types: Cocaine, Methamphetamines, Marijuana     Comment: last useof methamphetamines and marijuana yesterday    Social History   Social History  . Marital status: Single    Spouse name: N/A  . Number of children: N/A  . Years of education: N/A   Social History Main Topics  . Smoking status: Current Every Day Smoker    Packs/day: 1.00    Types: Cigarettes  . Smokeless tobacco: Never Used  . Alcohol use Yes     Comment: occasional  . Drug use:     Types: Cocaine, Methamphetamines, Marijuana     Comment: last useof methamphetamines and marijuana yesterday  . Sexual activity: Not Currently   Other Topics Concern  . None   Social History Narrative  . None   Hospital Course: Joshua Davila is a 35 year old Caucasian male, who is on SSD , has a hx of PTSD, gender dysphoria as well as polysubstance abuse , presented IVCed for disorganized behavior and suicidal thoughts. Joshua Davila is transitioning from male to male gender.  This is one of Milfred's several discharge summaries from this hospital alone. He was discharged from this hospital on 09-19-15 after mood stabilization treatments. Joshua Davila Joshua Muir([name]) as she liked to be addressed [facility] this [facility] around with (his) her UDS tests reports showing positive THC. She (he) had admitted abusing and binging on multiple substances during his (her) last hospitalizations. He presented to the hospital with disorganized behavior & suicidal thoughts. He was again admitted under involuntary commitment. He was in need of mood stabilization treatments. Joshua Davila Joshua Muir([name]) received while in this hospital; Geodon 20  mg for mood control, Paroxetine 20 mg for depression, Depakote 250 mg & 1,000  mg respectively for mood stabilization, Lorazepam 1 mg prn for anxiety, Strattera 40 mg for ADHD, Trazodone 50 mg for insomnia & Cogentin 0.5 mg for prevention of EPS. Joshua Davila Joshua Muir([name]) was also resumed on all her (his) pertinent home medications for the other medical issues presented.    During the  course of his hospitalization, Joshua Davila was enrolled & participated in the group counseling sessions and AA/NA meetings being offered and held on this unit. She learned coping skills. Joshua Davila is currently being discharged to continue psychiatric care and substance abuse treatment at [facility] [place]. She is provided with all the pertinent information required to make this appointment without problems. Upon discharge, Joshua Davila appeared stable mentally & medically. However, will continue to require mood stabilization treatments & close monitoring as his mental illness is chronic in nature.  [facility] in no apparent distress. Transportation per friend.   Physical Findings:  AIMS: Facial and Oral Movements Muscles of Facial Expression: None, normal Lips and Perioral Area: None, normal Jaw: None, normal Tongue: None, normal,Extremity Movements Upper (arms, wrists, hands, fingers): None, normal Lower (legs, knees, ankles, toes): None, normal, Trunk Movements Neck, shoulders, hips: None, normal, Overall Severity Severity of abnormal movements (highest score from questions above): None, normal Incapacitation due to abnormal movements: None, normal Patient's awareness of abnormal movements (rate only patient's report): No Awareness, Dental Status Current problems with teeth and/or dentures?: No Does patient usually wear dentures?: No  CIWA:    COWS:     Musculoskeletal: Strength & Muscle Tone: within normal limits Gait & Station: normal Patient leans: N/A  Psychiatric Specialty Exam: See SRA BY MD Review of Systems  Constitutional: Negative.   HENT: Negative.   Eyes: Negative.   Respiratory: Negative.   Cardiovascular: Negative.   Gastrointestinal: Negative.   Genitourinary: Negative.   Musculoskeletal: Negative.   Skin: Negative.   Neurological: Negative.   Endo/Heme/Allergies: Negative.   Psychiatric/Behavioral: Positive for depression  (Improving), hallucinations (Hx. Psycosis) and substance abuse. Negative for memory loss and suicidal ideas (Hx. Polysubstance dependence). The patient has insomnia ("Improving"). The patient is not nervous/anxious.   All other systems reviewed and are negative. See Md's SRA  Blood pressure 111/82, pulse 81, temperature 98.7 F (37.1 C), temperature source Oral, resp. rate 18, height 6' (1.829 m), weight 84.4 kg (186 lb), SpO2 100 %.Body mass index is 25.23 kg/m.  Have you used any form of tobacco in the last 30 days? (Cigarettes, Smokeless Tobacco, Cigars, and/or Pipes): Yes  Has this patient used any form of tobacco in the last 30 days? (Cigarettes, Smokeless Tobacco, Cigars, and/or Pipes) : Yes, A prescription for an FDA-approved tobacco cessation medication was offered at discharge and the patient refused  Blood Alcohol level:  Lab Results  Component Value Date   Digestive Disease InstituteETH <5 10/30/2015   ETH <5 10/01/2015    Metabolic Disorder Labs:  Lab Results  Component Value Date   HGBA1C 5.1 09/17/2015   MPG 100 09/17/2015   No results found for: PROLACTIN Lab Results  Component Value Date   CHOL 191 09/17/2015   TRIG 196 (H) 09/17/2015   HDL 27 (L) 09/17/2015   CHOLHDL 7.1 09/17/2015   VLDL 39 09/17/2015   LDLCALC 125 (H) 09/17/2015   See Psychiatric Specialty Exam and Suicide Risk Assessment completed by Attending Physician prior to discharge.  Discharge destination:  Home  Is patient on multiple antipsychotic therapies at discharge:  No   Has Patient had three  or more failed trials of antipsychotic monotherapy by history:  No  Recommended Plan for Multiple Antipsychotic Therapies: NA    Medication List    STOP taking these medications   aspirin 81 MG EC tablet   diphenhydrAMINE 50 MG/ML injection Commonly known as:  BENADRYL   estradiol 0.05 MG/24HR patch Commonly known as:  ALORA   finasteride 5 MG tablet Commonly known as:  PROSCAR   lamoTRIgine 25 MG tablet Commonly  known as:  LAMICTAL   modafinil 100 MG tablet Commonly known as:  PROVIGIL   QUEtiapine 100 MG tablet Commonly known as:  SEROQUEL   triamcinolone cream 0.1 % Commonly known as:  KENALOG   trihexyphenidyl 2 MG tablet Commonly known as:  ARTANE   ziprasidone injection Commonly known as:  GEODON     TAKE these medications     Indication  acetaminophen 325 MG tablet Commonly known as:  TYLENOL Take 2 tablets (650 mg total) by mouth every 4 (four) hours as needed for mild pain or fever (T > 38.3Celsius).  Indication:  Fever, Pain   alum & mag hydroxide-simeth 200-200-20 MG/5ML suspension Commonly known as:  MAALOX/MYLANTA Take 30 mLs by mouth as needed for indigestion or heartburn.  Indication:  Ingigestion   atomoxetine 40 MG capsule Commonly known as:  STRATTERA Take 1 capsule (40 mg total) by mouth daily. For ADHD/depression  Indication:  Attention Deficit Hyperactivity Disorder   atorvastatin 20 MG tablet Commonly known as:  LIPITOR Take 1 tablet (20 mg total) by mouth daily. For high Cholesterol  Indication:  Inherited Heterozygous Hypercholesterolemia   benztropine 0.5 MG tablet Commonly known as:  COGENTIN Take 1 tablet (0.5 mg total) by mouth 2 (two) times daily. For prevention of drug induced tremors  Indication:  Extrapyramidal Reaction caused by Medications   divalproex 500 MG DR tablet Commonly known as:  DEPAKOTE Take 2 tablets (1,000 mg total) by mouth daily at 8 pm. For mood stabilization  Indication:  Mood stabilization   divalproex 250 MG DR tablet Commonly known as:  DEPAKOTE Take 1 tablet (250 mg total) by mouth daily after breakfast. For mood stabilization  Indication:  Mood stabilization   emtricitabine-tenofovir 200-300 MG tablet Commonly known as:  TRUVADA Take 1 tablet by mouth daily. For pre-exposure to HIV  Indication:  Pre-Exposure Prophylaxis of HIV   ibuprofen 600 MG tablet Commonly known as:  ADVIL,MOTRIN Take 1 tablet (600 mg  total) by mouth every 8 (eight) hours as needed (Temp > 38.3Celsius). For pain  Indication:  Pain   lidocaine 5 % Commonly known as:  LIDODERM Place 1 patch onto the skin daily. Remove & Discard patch within 12 hours or as directed by MD: For pain management  Indication:  Pain management   LORazepam 1 MG tablet Commonly known as:  ATIVAN Take 1 tablet (1 mg total) by mouth every 8 (eight) hours as needed for anxiety (agitation).  Indication:  Feeling Anxious   methocarbamol 500 MG tablet Commonly known as:  ROBAXIN Take 1 tablet (500 mg total) by mouth every 8 (eight) hours as needed for muscle spasms.  Indication:  Musculoskeletal Pain   nicotine polacrilex 2 MG gum Commonly known as:  NICORETTE Take 1 each (2 mg total) by mouth as needed for smoking cessation.  Indication:  Nicotine Addiction   ondansetron 4 MG tablet Commonly known as:  ZOFRAN Take 1 tablet (4 mg total) by mouth every 8 (eight) hours as needed for nausea or vomiting.  Indication:  Nausea   PARoxetine 20 MG tablet Commonly known as:  PAXIL Take 1 tablet (20 mg total) by mouth daily. For depression  Indication:  Major Depressive Disorder, Migraine Headache   traZODone 50 MG tablet Commonly known as:  DESYREL Take 1 tablet (50 mg total) by mouth at bedtime as needed for sleep.  Indication:  Trouble Sleeping   ziprasidone 20 MG capsule Commonly known as:  GEODON Take 1 capsule (20 mg total) by mouth daily with supper. For mood control  Indication:  Mood control      Follow-up Information    [facility] .   Specialty:  Behavioral Health Why:  Go to the walk-in clinic M-F between 8 and 11AM for your hospital follow up appointment Contact information: [address] [place] [address] [phone]         Follow-up recommendations: Activity:  As tolerated Diet: As recommended by your primary care doctor. Keep all scheduled follow-up appointments as recommended.   Comments: Patient is instructed  prior to discharge to: Take all medications as prescribed by his/her mental healthcare provider. Report any adverse effects and or reactions from the medicines to his/her outpatient provider promptly. Patient has been instructed & cautioned: To not engage in alcohol and or illegal drug use while on prescription medicines. In the event of worsening symptoms, patient is instructed to call the crisis hotline, 911 and or go to the nearest ED for appropriate evaluation and treatment of symptoms. To follow-up with his/her primary care provider for your other medical issues, concerns and or health care needs.   Signed: Sanjuana Kava, NP, PMHNP-BC 11/12/2015, 9:08 AM

## 2015-11-11 NOTE — [facility] Suicide Risk Assessment (Signed)
[facility] INPATIENT:  Family/Significant Other Suicide Prevention Education  Suicide Prevention Education:  Patient Refusal for Family/Significant Other Suicide Prevention Education: The patient Joshua Davila has refused to provide written consent for family/significant other to be provided Family/Significant Other Suicide Prevention Education during admission and/or prior to discharge.  Physician notified.  [facility] 11/11/2015, 11:14 AM

## 2015-11-11 NOTE — BHH Suicide Risk Assessment (Signed)
[facility]   Principal Problem: Schizoaffective disorder, bipolar type [facility](HCC) Discharge Diagnoses:  Patient Active Problem List   Diagnosis Date Noted  . Polysubstance abuse [F19.10]   . Schizoaffective disorder, bipolar type (HCC) [F25.0] 10/02/2015  . Involuntary commitment [Z04.6] 10/02/2015  . Cannabis use disorder, moderate, dependence (HCC) [F12.20] 09/16/2015  . Opioid use disorder, moderate, dependence (HCC) [F11.20] 09/16/2015  . 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 Benzodiazepine dependence (HCC) [F13.20] 10/04/2013  . PTSD (post-traumatic stress disorder) [F43.10] 10/03/2013  . Polysubstance dependence (HCC) [F19.20] 10/02/2013    Total Time spent with patient: 20 minutes  Musculoskeletal: Strength & Muscle Tone: within normal limits Gait & Station: normal Patient leans: N/A  Psychiatric Specialty Exam: Review of Systems  Musculoskeletal: Negative for back pain.  Psychiatric/Behavioral: Positive for depression. Negative for hallucinations, substance abuse and suicidal ideas. The patient is nervous/anxious. The patient does not have insomnia.     Blood pressure 111/82, pulse 81, temperature 98.7 F (37.1 C), temperature source Oral, resp. rate 18, height 6' (1.829 m), weight 186 lb (84.4 kg), SpO2 100 %.Body mass index is 25.23 kg/m.  General Appearance: Casual  Eye Contact::  Fair  Speech:  Normal Rate409  Volume:  Normal  Mood:  Anxious  Affect:  anxious  Thought Process:  Descriptions of Associations: Circumstantial  Orientation:  Full (Time, Place, and Person)  Thought Content:  Logical No paranoia. Perceptions: denies AH/VH  Suicidal Thoughts:  No  Homicidal Thoughts:   No  Memory:  intact  Judgement:  Fair  Insight:  Shallow  Psychomotor Activity:  Normal  Concentration:  Good  Recall:  Good  Fund of Knowledge:Good  Language: NA  Akathisia:  No  Handed:  Right  AIMS (if indicated):     Assets:  Desire for Improvement  Sleep:  Number of Hours: 6.75  Cognition: WNL  ADL's:  Intact   Mental Status Per Nursing Assessment::   On Admission:  NA  Demographic Factors:  Male and Caucasian  Loss Factors: NA  Historical Factors: Prior suicide attempts and Impulsivity Several times, last in 8 years ago, took Benadryl  Risk Reduction Factors:   Positive social support  Continued Clinical Symptoms:  Schizophrenia:   Depressive state  Cognitive Features That Contribute To Risk:  Closed-mindedness    Suicide Risk:  Mild:  Suicidal ideation of limited frequency, intensity, duration, and specificity.  There are no identifiable plans, no associated intent, mild dysphoria and related symptoms, good self-control (both objective and subjective assessment), few other risk factors, and identifiable protective factors, including available and accessible social support.  Follow-up Information    [facility] .   Specialty:  Behavioral Health Why:  Go to the walk-in clinic M-F between 8 and 11AM for your hospital follow up appointment Contact information: [address] [place] [address] [phone]          Patient endorses anxiety but no significant evidence of active mood symptoms upon discharge. Thought process is circumstantial but is easily redirected. He denies dizziness and reports current medication regimen to be helpful. Patient will be discharged to his husband's home. He reports that he feels safe there. He denies SI/HI/Ah/VH. VPA checked- 49. Will defer further uptitration to his outpatient psychiatrist.   Plan Of Care/Follow-up recommendations:  Activity:  regular Diet:  regular Tests:  none Other:  follow up as above  Neysa Hottereina  Librada Castronovo, MD 11/11/2015, 10:54 AM

## 2016-03-13 ENCOUNTER — Emergency Department (HOSPITAL_COMMUNITY)
Admission: EM | Admit: 2016-03-13 | Discharge: 2016-03-17 | Disposition: A | Discharge status: Other Acute Inpatient Hospital | Payer: Medicare Other | Attending: Emergency Medicine | Admitting: Emergency Medicine

## 2016-03-13 DIAGNOSIS — F15151 Other stimulant abuse with stimulant-induced psychotic disorder with hallucinations: Secondary | ICD-10-CM | POA: Insufficient documentation

## 2016-03-13 DIAGNOSIS — Z79899 Other long term (current) drug therapy: Secondary | ICD-10-CM | POA: Diagnosis not present

## 2016-03-13 DIAGNOSIS — F29 Unspecified psychosis not due to a substance or known physiological condition: Secondary | ICD-10-CM | POA: Diagnosis not present

## 2016-03-13 DIAGNOSIS — F23 Brief psychotic disorder: Secondary | ICD-10-CM | POA: Diagnosis not present

## 2016-03-13 DIAGNOSIS — Z5181 Encounter for therapeutic drug level monitoring: Secondary | ICD-10-CM | POA: Insufficient documentation

## 2016-03-13 DIAGNOSIS — F918 Other conduct disorders: Secondary | ICD-10-CM | POA: Diagnosis present

## 2016-03-13 DIAGNOSIS — F15951 Other stimulant use, unspecified with stimulant-induced psychotic disorder with hallucinations: Secondary | ICD-10-CM | POA: Diagnosis present

## 2016-03-13 DIAGNOSIS — F1510 Other stimulant abuse, uncomplicated: Secondary | ICD-10-CM | POA: Diagnosis not present

## 2016-03-13 NOTE — ED Triage Notes (Addendum)
Pt from home via EMS. Pt called [employer] because he was hurting. Per EMS pt is labile and aggressive. Pt initially called 911 because he was hurting. EMS was not able to get vital signs due to erratic behavior of pt. Pt is responding to internal stimuli. Pt goes by the name [name]. 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 Pt also states he is angry with his husband. Pt also stated he stopped taking all of his medications.

## 2016-03-14 DIAGNOSIS — F15951 Other stimulant use, unspecified with stimulant-induced psychotic disorder with hallucinations: Secondary | ICD-10-CM | POA: Diagnosis present

## 2016-03-14 DIAGNOSIS — F1510 Other stimulant abuse, uncomplicated: Secondary | ICD-10-CM | POA: Diagnosis present

## 2016-03-14 DIAGNOSIS — Z79899 Other long term (current) drug therapy: Secondary | ICD-10-CM | POA: Diagnosis not present

## 2016-03-14 LAB — RAPID URINE DRUG SCREEN, HOSP PERFORMED
AMPHETAMINES: POSITIVE — AB
BENZODIAZEPINES: NOT DETECTED
Barbiturates: NOT DETECTED
COCAINE: NOT DETECTED
Opiates: NOT DETECTED
Tetrahydrocannabinol: POSITIVE — AB

## 2016-03-14 LAB — COMPREHENSIVE METABOLIC PANEL
ALBUMIN: 4.9 g/dL (ref 3.5–5.0)
ALT: 39 U/L (ref 17–63)
ANION GAP: 9 (ref 5–15)
AST: 41 U/L (ref 15–41)
Alkaline Phosphatase: 58 U/L (ref 38–126)
BUN: 19 mg/dL (ref 6–20)
CO2: 25 mmol/L (ref 22–32)
Calcium: 9.6 mg/dL (ref 8.9–10.3)
Chloride: 104 mmol/L (ref 101–111)
Creatinine, Ser: 0.83 mg/dL (ref 0.61–1.24)
GFR calc Af Amer: >60 mL/min (ref >60)
GFR calc non Af Amer: >60 mL/min (ref >60)
GLUCOSE: 102 mg/dL — AB (ref 65–99)
POTASSIUM: 3.8 mmol/L (ref 3.5–5.1)
Sodium: 138 mmol/L (ref 135–145)
Total Bilirubin: 1.4 mg/dL — ABNORMAL HIGH (ref 0.3–1.2)
Total Protein: 8.3 g/dL — ABNORMAL HIGH (ref 6.5–8.1)

## 2016-03-14 LAB — CBC
HEMATOCRIT: 36.6 % — AB (ref 39.0–52.0)
Hemoglobin: 13 g/dL (ref 13.0–17.0)
MCH: 28.8 pg (ref 26.0–34.0)
MCHC: 35.5 g/dL (ref 30.0–36.0)
MCV: 81.2 fL (ref 78.0–100.0)
PLATELETS: 202 10*3/uL (ref 150–400)
RBC: 4.51 MIL/uL (ref 4.22–5.81)
RDW: 14.2 % (ref 11.5–15.5)
WBC: 7.5 10*3/uL (ref 4.0–10.5)

## 2016-03-14 LAB — ACETAMINOPHEN LEVEL

## 2016-03-14 LAB — SALICYLATE LEVEL: Salicylate Lvl: <7 mg/dL (ref 2.8–30.0)

## 2016-03-14 LAB — ETHANOL

## 2016-03-14 MED ORDER — TRAZODONE HCL 100 MG PO TABS
100.0000 mg | ORAL_TABLET | Freq: Every day | ORAL | Status: DC
  Administered 2016-03-14 – 2016-03-16 (×3): 100 mg via ORAL
  Filled 2016-03-14 (×3): qty 1

## 2016-03-14 MED ORDER — ACETAMINOPHEN 325 MG PO TABS
650.0000 mg | ORAL_TABLET | Freq: Four times a day (QID) | ORAL | Status: DC | PRN
  Administered 2016-03-14 – 2016-03-15 (×3): 650 mg via ORAL
  Filled 2016-03-14 (×3): qty 2

## 2016-03-14 MED ORDER — IBUPROFEN 800 MG PO TABS
800.0000 mg | ORAL_TABLET | Freq: Once | ORAL | Status: AC
  Administered 2016-03-14: 800 mg via ORAL
  Filled 2016-03-14: qty 1

## 2016-03-14 MED ORDER — MUPIROCIN CALCIUM 2 % EX CREA
TOPICAL_CREAM | Freq: Two times a day (BID) | CUTANEOUS | Status: DC
  Administered 2016-03-14 – 2016-03-17 (×6): via TOPICAL
  Filled 2016-03-14: qty 15

## 2016-03-14 MED ORDER — HYDROXYZINE HCL 25 MG PO TABS
25.0000 mg | ORAL_TABLET | Freq: Four times a day (QID) | ORAL | Status: DC | PRN
  Administered 2016-03-14 – 2016-03-16 (×3): 25 mg via ORAL
  Filled 2016-03-14 (×4): qty 1

## 2016-03-14 MED ORDER — HYDROXYZINE HCL 25 MG PO TABS
50.0000 mg | ORAL_TABLET | Freq: Once | ORAL | Status: AC
  Administered 2016-03-14: 50 mg via ORAL
  Filled 2016-03-14: qty 2

## 2016-03-14 NOTE — ED Notes (Addendum)
Pt came back with rapid speech, stating he wants to leave. EDp called due to pt volentary status.

## 2016-03-14 NOTE — ED Notes (Signed)
SBAR Report received from previous nurse. Pt received calm and visible on unit. Pt gave no meaningful answers to assessment questions related to  SI/ HI, A/V H, depression, anxiety, or pain at this time, and appears otherwise stable and free of distress. Pt reminded of camera surveillance, q 15 min rounds, and rules of the milieu. Will continue to assess.

## 2016-03-14 NOTE — ED Notes (Signed)
Pt resting quietly, calm.  Pt is aware that the EDP will see him.  TTS is aware of the [facility]

## 2016-03-14 NOTE — ED Notes (Signed)
SBAR Report received from previous nurse. Pt irritable and paranoid on unit. Pt Gave no meaningful answers to assessment questions related to current SI/ HI, A/V H, depression, anxiety, or pain at this time, and appears otherwise stable and free of distress. Pt has rapid and pressured speech, was talking over Clinical research associatewriter stating that he wants to go.  Pt reminded of camera surveillance, q 15 min rounds, and rules of the milieu. EDP notified of pt request for discharge, IVC paperwork being processed. Pt provided with sandwich and pt has calmed down slightly. Pt now able to sit in his room without yelling at writer/ Will continue to assess.

## 2016-03-14 NOTE — ED Notes (Signed)
Cool clothes offered for pt's face-pt declined

## 2016-03-14 NOTE — ED Notes (Signed)
PT is refusing blood draw

## 2016-03-14 NOTE — Consult Note (Signed)
Joshua Davila Psychiatry Consult   Reason for Consult:  Psychosis  Referring Physician:  EDP Patient Identification: Joshua Davila MRN:  [id number] Principal Diagnosis: Amphetamine and psychostimulant-induced psychosis with hallucinations [facility]) Diagnosis:   Patient Active Problem List   Diagnosis Date Noted  . Amphetamine abuse [F15.10] 03/14/2016    Priority: High  . Amphetamine and psychostimulant-induced psychosis with hallucinations [facility]) [F15.951] 03/14/2016    Priority: High    Total Time spent with patient: 45 minutes  Subjective:   Joshua Davila is a 35 y.o. male patient admitted with psychosis.  HPI:  On admission:  35 y.o. male presenting to [facility] with bizarre behaviors. When pt was asked about his presenting problem pt began pacing the room and requested paper and something to write with. Pt stated "I need some paper before I pass out or die". A thorough assessment could not be completed due to pt's mental status. Pt paced throughout the assessment and his speech was often incoherent. When pt was asked about substance abuse pt stated "poison my kind, I don't know what I had, they take your blood and then ask for extra".   Patient did not sleep last night until about seven this morning.  He is still responding to internal stimuli and the effects of meth.    Past Psychiatric History: substance abuse   Risk to Self: Suicidal Ideation: No Suicidal Intent: No Is patient at risk for suicide?: No Suicidal Plan?: No Access to Means: No What has been your use of drugs/alcohol within the last 12 months?: Unable to assess Other Self Harm Risks: Denies  Intentional Self Injurious Behavior: None Risk to Others: Homicidal Ideation:  (unable to assess) Thoughts of Harm to Others:  (unable to assess) Current Homicidal Intent:  (unable to assess) Current Homicidal Plan:  (unable to assess) Access to Homicidal Means:  (unable to assess) Identified Victim:  (unable to assess ) History  of harm to others?:  (unable to assess) Assessment of Violence: None Noted Violent Behavior Description: No violent behavior observe at this time.  Does patient have access to weapons?:  (unable to assess) Criminal Charges Pending?: No Does patient have a court date: No Prior Inpatient Therapy: Prior Inpatient Therapy: Yes Prior Therapy Dates: 2017 Prior Therapy Facilty/Provider(s): [facility] Reason for Treatment: Schizoaffective  Prior Outpatient Therapy: Prior Outpatient Therapy: Yes Prior Therapy Dates: current  Prior Therapy Facilty/Provider(s): [facility]  Reason for Treatment: Schizoaffective  Does patient have an ACCT team?: No Does patient have Intensive In-House Services?  : No Does patient have [facility] services? : No Does patient have P4CC services?: No  Past Medical History: No past medical history on file. No past surgical history on file. Family History: No family history on file. Family Psychiatric  History: unknown Social History:  History  Alcohol use Not on file     History  Drug use: Unknown    Social History   Social History  . Marital status: Single    Spouse name: N/A  . Number of children: N/A  . Years of education: N/A   Social History Main Topics  . Smoking status: Not on file  . Smokeless tobacco: Not on file  . Alcohol use Not on file  . Drug use: Unknown  . Sexual activity: Not on file   Other Topics Concern  . Not on file   Social History Narrative  . No narrative on file   Additional Social History:    Allergies:  Allergies not on file  Labs:  Results for orders placed or performed during the hospital encounter of 03/13/16 (from the past 48 hour(s))  Comprehensive metabolic panel     Status: Abnormal   Collection Time: 03/14/16 12:56 AM  Result Value Ref Range   Sodium 138 135 - 145 mmol/L   Potassium 3.8 3.5 - 5.1 mmol/L   Chloride 104 101 - 111 mmol/L   CO2 25 22 - 32 mmol/L   Glucose, Bld 102 (H) 65 - 99 mg/dL   BUN 19 6 -  20 mg/dL   Creatinine, Ser 0.83 0.61 - 1.24 mg/dL   Calcium 9.6 8.9 - 10.3 mg/dL   Total Protein 8.3 (H) 6.5 - 8.1 g/dL   Albumin 4.9 3.5 - 5.0 g/dL   AST 41 15 - 41 U/L   ALT 39 17 - 63 U/L   Alkaline Phosphatase 58 38 - 126 U/L   Total Bilirubin 1.4 (H) 0.3 - 1.2 mg/dL   GFR calc non Af Amer >60 >60 mL/min   GFR calc Af Amer >60 >60 mL/min    Comment: (NOTE) The eGFR has been calculated using the CKD EPI equation. This calculation has not been validated in all clinical situations. eGFR's persistently <60 mL/min signify possible Chronic Kidney Disease.    Anion gap 9 5 - 15  Ethanol     Status: None   Collection Time: 03/14/16 12:56 AM  Result Value Ref Range   Alcohol, Ethyl (B) <5 <5 mg/dL    Comment:        LOWEST DETECTABLE LIMIT FOR SERUM ALCOHOL IS 5 mg/dL FOR MEDICAL PURPOSES ONLY   Salicylate level     Status: None   Collection Time: 03/14/16 12:56 AM  Result Value Ref Range   Salicylate Lvl <1.6 2.8 - 30.0 mg/dL  Acetaminophen level     Status: Abnormal   Collection Time: 03/14/16 12:56 AM  Result Value Ref Range   Acetaminophen (Tylenol), Serum <10 (L) 10 - 30 ug/mL    Comment:        THERAPEUTIC CONCENTRATIONS VARY SIGNIFICANTLY. A RANGE OF 10-30 ug/mL MAY BE AN EFFECTIVE CONCENTRATION FOR MANY PATIENTS. HOWEVER, SOME ARE BEST TREATED AT CONCENTRATIONS OUTSIDE THIS RANGE. ACETAMINOPHEN CONCENTRATIONS >150 ug/mL AT 4 HOURS AFTER INGESTION AND >50 ug/mL AT 12 HOURS AFTER INGESTION ARE OFTEN ASSOCIATED WITH TOXIC REACTIONS.   cbc     Status: Abnormal   Collection Time: 03/14/16 12:56 AM  Result Value Ref Range   WBC 7.5 4.0 - 10.5 K/uL   RBC 4.51 4.22 - 5.81 MIL/uL   Hemoglobin 13.0 13.0 - 17.0 g/dL   HCT 36.6 (L) 39.0 - 52.0 %   MCV 81.2 78.0 - 100.0 fL   MCH 28.8 26.0 - 34.0 pg   MCHC 35.5 30.0 - 36.0 g/dL   RDW 14.2 11.5 - 15.5 %   Platelets 202 150 - 400 K/uL  Rapid urine drug screen (hospital performed)     Status: Abnormal   Collection  Time: 03/14/16  4:58 AM  Result Value Ref Range   Opiates NONE DETECTED NONE DETECTED   Cocaine NONE DETECTED NONE DETECTED   Benzodiazepines NONE DETECTED NONE DETECTED   Amphetamines POSITIVE (A) NONE DETECTED   Tetrahydrocannabinol POSITIVE (A) NONE DETECTED   Barbiturates NONE DETECTED NONE DETECTED    Comment:        DRUG SCREEN FOR MEDICAL PURPOSES ONLY.  IF CONFIRMATION IS NEEDED FOR ANY PURPOSE, NOTIFY LAB WITHIN 5 DAYS.  LOWEST DETECTABLE LIMITS FOR URINE DRUG SCREEN Drug Class       Cutoff (ng/mL) Amphetamine      1000 Barbiturate      200 Benzodiazepine   811 Tricyclics       031 Opiates          300 Cocaine          300 THC              50     Current Facility-Administered Medications  Medication Dose Route Frequency Provider Last Rate Last Dose  . acetaminophen (TYLENOL) tablet 650 mg  650 mg Oral Q6H PRN Quintella Reichert, MD   650 mg at 03/14/16 0454   No current outpatient prescriptions on file.    Musculoskeletal: Strength & Muscle Tone: within normal limits Gait & Station: normal Patient leans: N/A  Psychiatric Specialty Exam: Physical Exam  Constitutional: He is oriented to person, place, and time. He appears well-developed and well-nourished.  HENT:  Head: Normocephalic.  Neck: Normal range of motion.  Respiratory: Effort normal.  Musculoskeletal: Normal range of motion.  Neurological: He is alert and oriented to person, place, and time.  Psychiatric: Judgment normal. His mood appears anxious. His speech is delayed. He is actively hallucinating. Thought content is paranoid. Cognition and memory are impaired.    Review of Systems  Psychiatric/Behavioral: Positive for hallucinations, memory loss and substance abuse. The patient is nervous/anxious.   All other systems reviewed and are negative.   Blood pressure 110/84, pulse 81, temperature 98.3 F (36.8 C), temperature source Oral, resp. rate 22, SpO2 100 %.There is no height or weight on  file to calculate BMI.  General Appearance: Disheveled  Eye Contact:  Minimal  Speech:  Normal Rate  Volume:  Decreased  Mood:  Anxious and Irritable  Affect:  Congruent  Thought Process:  Disorganized  Orientation:  Other:  person  Thought Content:  Hallucinations: Auditory Visual and Paranoid Ideation  Suicidal Thoughts:  No  Homicidal Thoughts:  No  Memory:  Immediate;   Poor Recent;   Poor Remote;   Poor  Judgement:  Impaired  Insight:  Lacking  Psychomotor Activity:  Decreased  Concentration:  Concentration: Poor and Attention Span: Poor  Recall:  Poor  Fund of Knowledge:  Fair  Language:  Fair  Akathisia:  No  Handed:  Right  AIMS (if indicated):     Assets:  Leisure Time Physical Health Resilience  ADL's:  Intact  Cognition:  Impaired,  Mild  Sleep:        Treatment Plan Summary: Daily contact with patient to assess and evaluate symptoms and progress in treatment, Medication management and Plan Amphetamine and psychostimulant-induced psychosis with hallucinations ([place])  -Crisis stabilization -Medication management:  Medications not started due to patient needing to clear the meth -Individual and substance abuse counseling  Disposition: Recommend psychiatric Inpatient admission when medically cleared.  Waylan Boga, NP 03/14/2016 11:52 AM  Patient seen face-to-face for psychiatric evaluation, chart reviewed and case discussed with the physician extender and developed treatment plan. Reviewed the information documented and agree with the treatment plan. Corena Pilgrim, MD

## 2016-03-14 NOTE — ED Notes (Signed)
Pt hit the code blue alarm in his room.

## 2016-03-14 NOTE — ED Notes (Signed)
Pt repeatedly washing his hands, pt tearful while washing his hands. Pt very disoriented, but gait steady. Pt talking to self in hall, escilating then calming himself then escilating again, all while talking to himself.

## 2016-03-14 NOTE — ED Provider Notes (Signed)
[facility]-EMERGENCY DEPT Provider Note   CSN: [id number] Arrival date & time: 03/13/16  2340     History   Chief Complaint Chief Complaint  Patient presents with  . Medical Clearance    HPI Joshua Davila is a 35 y.o. male.  HPI Remainder of history, ROS, and physical exam limited due to patient's condition (psychosis). Additional information was obtained from [employer].  Level V [place].  They report that they were called out to patient's home because "he was hurting." EMS was contacted. Per the report patient was labile and aggressive. They were unable to obtain appropriate vital signs are assessment. Patient appears to be responding to internal stimuli, has tangental speech.  No past medical history on file.  There are no active problems to display for this patient.   No past surgical history on file.     Home Medications    Prior to Admission medications   Not on File    Family History No family history on file.  Social History Social History  Substance Use Topics  . Smoking status: Not on file  . Smokeless tobacco: Not on file  . Alcohol use Not on file     Allergies   Patient has no allergy information on record.   Review of Systems Review of Systems  Unable to perform ROS: Psychiatric disorder     Physical Exam Updated Vital Signs BP 110/84 (BP Location: Left Arm)   Pulse 81   Temp 98.3 F (36.8 C) (Oral)   Resp 22   SpO2 100%   Physical Exam  Constitutional: He is oriented to person, place, and time. He appears well-developed and well-nourished. No distress.  HENT:  Head: Normocephalic and atraumatic.  Right Ear: External ear normal.  Left Ear: External ear normal.  Nose: Nose normal.  Mouth/Throat: Mucous membranes are normal. No trismus in the jaw.  Eyes: Conjunctivae and EOM are normal. No scleral icterus.  Neck: Normal range of motion and phonation normal.  Cardiovascular: Normal rate and regular rhythm.   Pulmonary/Chest: Effort  normal. No stridor. No respiratory distress.  Abdominal: He exhibits no distension.  Musculoskeletal: Normal range of motion. He exhibits no edema.  Neurological: He is alert and oriented to person, place, and time.  Skin: Lesion (on face, chest, and BUE) noted. He is not diaphoretic.  Psychiatric: He has a normal mood and affect. His behavior is normal.  Vitals reviewed.    ED Treatments / Results  Labs (all labs ordered are listed, but only abnormal results are displayed) Labs Reviewed  COMPREHENSIVE METABOLIC PANEL - Abnormal; Notable for the following:       Result Value   Glucose, Bld 102 (*)    Total Protein 8.3 (*)    Total Bilirubin 1.4 (*)    All other components within normal limits  ACETAMINOPHEN LEVEL - Abnormal; Notable for the following:    Acetaminophen (Tylenol), Serum <10 (*)    All other components within normal limits  CBC - Abnormal; Notable for the following:    HCT 36.6 (*)    All other components within normal limits  ETHANOL  SALICYLATE LEVEL  RAPID URINE DRUG SCREEN, HOSP PERFORMED    EKG  EKG Interpretation None       Radiology No results found.  Procedures Procedures (including critical care time)  Medications Ordered in ED Medications - No data to display   Initial Impression / Assessment and Plan / ED Course  I have reviewed the triage vital signs and  the nursing notes.  Pertinent labs & imaging results that were available during my care of the patient were reviewed by me and considered in my medical decision making (see chart for details).  Clinical Course as of Mar 14 302  Sat Mar 14, 2016  0216 Patient appears to be an acute psychosis from decompensated schizophrenia. Patient was initially here voluntarily however requested to leave. Given the fact that he appears to be in acute psychosis and 24-hour Holter was placed for psychiatric evaluation. Patient medically cleared for behavioral health evaluation and management.  [PC]      Clinical Course User Index [PC] Nira ConnPedro Eduardo Keelen Quevedo, MD      Final Clinical Impressions(s) / ED Diagnoses   Final diagnoses:  Psychosis, unspecified psychosis type     Nira ConnPedro Eduardo Penne Rosenstock, MD 03/14/16 [phone]

## 2016-03-14 NOTE — ED Notes (Signed)
Pt asleep, Clinical research associatewriter asked tech not to disturb pt at this time for VS.

## 2016-03-14 NOTE — BH Assessment (Signed)
Tele Assessment Note   Joshua CablesJames Arman is an 35 y.o. male presenting [facility] with bizarre behaviors. When pt was asked about his presenting problem pt began pacing the room and requested paper and something to write with. Pt stated "I need some paper before I pass out or die". A thorough assessment could not be completed due to pt's mental status. Pt paced throughout the assessment and his speech was often incoherent. When pt was asked about substance abuse pt stated "poison my kind, I don't know what I had, they take your blood and then ask for extra".   Inpatient hospitalization is needed for stabilization.   Diagnosis: Schizoaffective Disorder   Past Medical History: No past medical history on file.  No past surgical history on file.  Family History: No family history on file.  Social History:  has no tobacco, alcohol, and drug history on file.  Additional Social History:  Alcohol / Drug Use History of alcohol / drug use?:  (unable to assess)  CIWA: CIWA-Ar BP: 110/84 Pulse Rate: 81 COWS:    PATIENT STRENGTHS: (choose at least two) Average or above average intelligence Supportive family/friends  Allergies: Allergies not on file  Home Medications:  (Not in a hospital admission)  OB/GYN Status:  No LMP for male patient.  General Assessment Data [facility] of Assessment: [facility] TTS Assessment: In system Is this a Tele or Face-to-Face Assessment?: Face-to-Face Is this an Initial Assessment or a Re-assessment for this encounter?: Initial Assessment Marital status: Married Living Arrangements: Spouse/significant other Can pt return to current living arrangement?: Yes Admission Status: Voluntary Is patient capable of signing voluntary admission?: Yes Referral Source: Self/Family/Friend     Crisis Care Plan Living Arrangements: Spouse/significant other Name of Psychiatrist:  (unable to assess ) Name of Therapist:  (unable to assess)  Education Status Is patient currently  in school?: No  Risk to self with the past 6 months Suicidal Ideation: No Has patient been a risk to self within the past 6 months prior to admission? : No Suicidal Intent: No Has patient had any suicidal intent within the past 6 months prior to admission? : No Is patient at risk for suicide?: No Suicidal Plan?: No Has patient had any suicidal plan within the past 6 months prior to admission? : No Access to Means: No What has been your use of drugs/alcohol within the last 12 months?: Unable to assess Previous Attempts/Gestures: No Other Self Harm Risks: Denies  Intentional Self Injurious Behavior: None Family Suicide History: Unable to assess Recent stressful life event(s): Conflict (Comment) (conflict with spouse ) Persecutory voices/beliefs?: Yes Depression:  (unable to assess) Substance abuse history and/or treatment for substance abuse?:  (unable to assess) Suicide prevention information given to non-admitted patients: Not applicable  Risk to Others within the past 6 months Homicidal Ideation:  (unable to assess) Does patient have any lifetime risk of violence toward others beyond the six months prior to admission? : Unknown Thoughts of Harm to Others:  (unable to assess) Current Homicidal Intent:  (unable to assess) Current Homicidal Plan:  (unable to assess) Access to Homicidal Means:  (unable to assess) Identified Victim:  (unable to assess ) History of harm to others?:  (unable to assess) Assessment of Violence: None Noted Violent Behavior Description: No violent behavior observe at this time.  Does patient have access to weapons?:  (unable to assess) Criminal Charges Pending?: No Does patient have a court date: No Is patient on probation?: No  Psychosis Hallucinations: Auditory (Pt  appears to be responding to internal stimuli. ) Delusions: None noted  Mental Status Report Appearance/Hygiene: In scrubs Eye Contact: Poor Motor Activity: Restlessness Speech:  Incoherent Level of Consciousness: Alert Mood: Euthymic Affect: Appropriate to circumstance Anxiety Level: Moderate Thought Processes: Tangential Judgement: Impaired Orientation: Unable to assess Obsessive Compulsive Thoughts/Behaviors: Unable to Assess  Cognitive Functioning Concentration: Unable to Assess Memory: Unable to Assess IQ: Average Insight: Unable to Assess Impulse Control: Unable to Assess Appetite:  (unable to assess) Weight Loss:  (unable to assess) Weight Gain:  (unable to assess) Sleep: Unable to Assess Total Hours of Sleep:  (unable to assess) Vegetative Symptoms: Unable to Assess  ADLScreening [facility] Assessment Services) Patient's cognitive ability adequate to safely complete daily activities?: Yes Patient able to express need for assistance with ADLs?: Yes Independently performs ADLs?: Yes (appropriate for developmental age)  Prior Inpatient Therapy Prior Inpatient Therapy: Yes Prior Therapy Dates: 2017 [facility] Reason for Treatment: Schizoaffective   Prior Outpatient Therapy Prior Outpatient Therapy: Yes Prior Therapy Dates: current  [facility]  Reason for Treatment: Schizoaffective  Does patient have an ACCT team?: No Does patient have Intensive In-House Services?  : No Does patient have [facility] services? : No Does patient have P4CC services?: No  ADL Screening (condition at time of admission) Patient's cognitive ability adequate to safely complete daily activities?: Yes Patient able to express need for assistance with ADLs?: Yes Independently performs ADLs?: Yes (appropriate for developmental age)       Abuse/Neglect Assessment (Assessment to be complete while patient is alone) Physical Abuse:  (unable to assess) Verbal Abuse:  (unable to assess) Sexual Abuse:  (unable to assess) Exploitation of patient/patient's resources:  (unable to assess) Self-Neglect:  (unable to  assess)          Additional Information 1:1 In Past 12 Months?: No CIRT Risk: No Elopement Risk: No Does patient have medical clearance?: No (Labs pending )     Disposition:  Disposition Initial Assessment Completed for this Encounter: Yes Disposition of Patient: Inpatient treatment program Type of inpatient treatment program: Adult  Thessaly Mccullers S 03/14/2016 1:55 AM

## 2016-03-14 NOTE — ED Notes (Signed)
Pt c/o pain in his skin. Pt cheeks raw and bleeding at times. Pt softly spoken tonight, BP low while lying down but pt responds to voice and light tapping on door.

## 2016-03-14 NOTE — ED Notes (Signed)
Pt went into empty room and pressed the code blue button and returned to his room.

## 2016-03-14 NOTE — ED Notes (Signed)
Dr Reynolds BowlLu updated and will see

## 2016-03-14 NOTE — ED Notes (Signed)
Up in the hall to the bathroom.  Pt requesting food and something to drink. Angry that the dr let him sleep.

## 2016-03-14 NOTE — ED Notes (Signed)
Pharm tech into see 

## 2016-03-15 DIAGNOSIS — F15951 Other stimulant use, unspecified with stimulant-induced psychotic disorder with hallucinations: Secondary | ICD-10-CM | POA: Diagnosis not present

## 2016-03-15 DIAGNOSIS — Z79899 Other long term (current) drug therapy: Secondary | ICD-10-CM | POA: Diagnosis not present

## 2016-03-15 MED ORDER — CARBAMAZEPINE ER 200 MG PO TB12
200.0000 mg | ORAL_TABLET | Freq: Two times a day (BID) | ORAL | Status: DC
  Administered 2016-03-15 – 2016-03-17 (×5): 200 mg via ORAL
  Filled 2016-03-15 (×5): qty 1

## 2016-03-15 MED ORDER — GABAPENTIN 300 MG PO CAPS
300.0000 mg | ORAL_CAPSULE | Freq: Three times a day (TID) | ORAL | Status: DC
  Administered 2016-03-15 – 2016-03-17 (×7): 300 mg via ORAL
  Filled 2016-03-15 (×7): qty 1

## 2016-03-15 NOTE — ED Notes (Signed)
Patient irritable and demanding.  Focused on "bot flies" on his skin.  Comfort measures offered.

## 2016-03-15 NOTE — Consult Note (Signed)
[facility] Psych[facility] Consult   Reason for Consult:  Psychosis  Referring Physician:  EDP Patient Identification: Joshua Davila MRN:  [id number] Principal Diagnosis: Amphetamine and psychostimulant-induced psychosis with hallucinations [facility]) Diagnosis:   Patient Active Problem List   Diagnosis Date Noted  . Amphetamine abuse [F15.10] 03/14/2016    Priority: High  . Amphetamine and psychostimulant-induced psychosis with hallucinations [facility]) [F15.951] 03/14/2016    Priority: High    Total Time spent with patient: 30 minutes  Subjective:   Joshua Davila is a 35 y.o. male patient admitted with psychosis.  HPI:  On admission:  35 y.o. male presenting to [facility] with bizarre behaviors. When pt was asked about his presenting problem pt began pacing the room and requested paper and something to write with. Pt stated "I need some paper before I pass out or die". A thorough assessment could not be completed due to pt's mental status. Pt paced throughout the assessment and his speech was often incoherent. When pt was asked about substance abuse pt stated "poison my kind, I don't know what I had, they take your blood and then ask for extra".   Patient continues to be depressed with paranoia that people are trying to poison him and trying to kill him.  Confused at times, saying no doctor has seen him and this is not true, does not appear to remember anything about yesterday.    Past Psychiatric History: substance abuse   Risk to Self: Suicidal Ideation: No Suicidal Intent: No Is patient at risk for suicide?: No Suicidal Plan?: No Access to Means: No What has been your use of drugs/alcohol within the last 12 months?: Unable to assess Other Self Harm Risks: Denies  Intentional Self Injurious Behavior: None Risk to Others: None Prior Inpatient Therapy: Prior Inpatient Therapy: Yes Prior Therapy Dates: 2017 Prior Therapy Facilty/Provider([facility] Reason for Treatment: Schizoaffective  Prior  Outpatient Therapy: Prior Outpatient Therapy: Yes Prior Therapy Dates: current  Prior Therapy Facilty/Provider(s): [facility]  Reason for Treatment: Schizoaffective  Does patient have an ACCT team?: No Does patient have Intensive In-House Services?  : No Does patient have [facility] services? : No Does patient have P4CC services?: No  Past Medical History: No past medical history on file. No past surgical history on file. Family History: No family history on file. Family Psychiatric  History: unknown Social History:  History  Alcohol use Not on file     History  Drug use: Unknown    Social History   Social History  . Marital status: Single    Spouse name: N/A  . Number of children: N/A  . Years of education: N/A   Social History Main Topics  . Smoking status: Not on file  . Smokeless tobacco: Not on file  . Alcohol use Not on file  . Drug use: Unknown  . Sexual activity: Not on file   Other Topics Concern  . Not on file   Social History Narrative  . No narrative on file   Additional Social History:    Allergies:   Allergies  Allergen Reactions  . Penicillins Swelling    Has patient had a PCN reaction causing immediate rash, facial/tongue/throat swelling, SOB or lightheadedness with hypotension: Yes Has patient had a PCN reaction causing severe rash involving mucus membranes or skin necrosis: No Has patient had a PCN reaction that required hospitalization Yes Has patient had a PCN reaction occurring within the last 10 years: No If all of the above answers are "NO",  then may proceed with Cephalosporin use.   . Sulfa Antibiotics Rash    Labs:  Results for orders placed or performed during the hospital encounter of 03/13/16 (from the past 48 hour(s))  Comprehensive metabolic panel     Status: Abnormal   Collection Time: 03/14/16 12:56 AM  Result Value Ref Range   Sodium 138 135 - 145 mmol/L   Potassium 3.8 3.5 - 5.1 mmol/L   Chloride 104 101 - 111 mmol/L   CO2  25 22 - 32 mmol/L   Glucose, Bld 102 (H) 65 - 99 mg/dL   BUN 19 6 - 20 mg/dL   Creatinine, Ser 0.83 0.61 - 1.24 mg/dL   Calcium 9.6 8.9 - 10.3 mg/dL   Total Protein 8.3 (H) 6.5 - 8.1 g/dL   Albumin 4.9 3.5 - 5.0 g/dL   AST 41 15 - 41 U/L   ALT 39 17 - 63 U/L   Alkaline Phosphatase 58 38 - 126 U/L   Total Bilirubin 1.4 (H) 0.3 - 1.2 mg/dL   GFR calc non Af Amer >60 >60 mL/min   GFR calc Af Amer >60 >60 mL/min    Comment: (NOTE) The eGFR has been calculated using the CKD EPI equation. This calculation has not been validated in all clinical situations. eGFR's persistently <60 mL/min signify possible Chronic Kidney Disease.    Anion gap 9 5 - 15  Ethanol     Status: None   Collection Time: 03/14/16 12:56 AM  Result Value Ref Range   Alcohol, Ethyl (B) <5 <5 mg/dL    Comment:        LOWEST DETECTABLE LIMIT FOR SERUM ALCOHOL IS 5 mg/dL FOR MEDICAL PURPOSES ONLY   Salicylate level     Status: None   Collection Time: 03/14/16 12:56 AM  Result Value Ref Range   Salicylate Lvl <8.3 2.8 - 30.0 mg/dL  Acetaminophen level     Status: Abnormal   Collection Time: 03/14/16 12:56 AM  Result Value Ref Range   Acetaminophen (Tylenol), Serum <10 (L) 10 - 30 ug/mL    Comment:        THERAPEUTIC CONCENTRATIONS VARY SIGNIFICANTLY. A RANGE OF 10-30 ug/mL MAY BE AN EFFECTIVE CONCENTRATION FOR MANY PATIENTS. HOWEVER, SOME ARE BEST TREATED AT CONCENTRATIONS OUTSIDE THIS RANGE. ACETAMINOPHEN CONCENTRATIONS >150 ug/mL AT 4 HOURS AFTER INGESTION AND >50 ug/mL AT 12 HOURS AFTER INGESTION ARE OFTEN ASSOCIATED WITH TOXIC REACTIONS.   cbc     Status: Abnormal   Collection Time: 03/14/16 12:56 AM  Result Value Ref Range   WBC 7.5 4.0 - 10.5 K/uL   RBC 4.51 4.22 - 5.81 MIL/uL   Hemoglobin 13.0 13.0 - 17.0 g/dL   HCT 36.6 (L) 39.0 - 52.0 %   MCV 81.2 78.0 - 100.0 fL   MCH 28.8 26.0 - 34.0 pg   MCHC 35.5 30.0 - 36.0 g/dL   RDW 14.2 11.5 - 15.5 %   Platelets 202 150 - 400 K/uL  Rapid urine  drug screen (hospital performed)     Status: Abnormal   Collection Time: 03/14/16  4:58 AM  Result Value Ref Range   Opiates NONE DETECTED NONE DETECTED   Cocaine NONE DETECTED NONE DETECTED   Benzodiazepines NONE DETECTED NONE DETECTED   Amphetamines POSITIVE (A) NONE DETECTED   Tetrahydrocannabinol POSITIVE (A) NONE DETECTED   Barbiturates NONE DETECTED NONE DETECTED    Comment:        DRUG SCREEN FOR MEDICAL PURPOSES ONLY.  IF CONFIRMATION IS NEEDED  FOR ANY PURPOSE, NOTIFY LAB WITHIN 5 DAYS.        LOWEST DETECTABLE LIMITS FOR URINE DRUG SCREEN Drug Class       Cutoff (ng/mL) Amphetamine      1000 Barbiturate      200 Benzodiazepine   341 Tricyclics       937 Opiates          300 Cocaine          300 THC              50     Current Facility-Administered Medications  Medication Dose Route Frequency Provider Last Rate Last Dose  . acetaminophen (TYLENOL) tablet 650 mg  650 mg Oral Q6H PRN Quintella Reichert, MD   650 mg at 03/15/16 9024  . hydrOXYzine (ATARAX/VISTARIL) tablet 25 mg  25 mg Oral Q6H PRN Patrecia Pour, NP   25 mg at 03/15/16 0973  . mupirocin cream (BACTROBAN) 2 %   Topical BID Rozetta Nunnery, NP      . traZODone (DESYREL) tablet 100 mg  100 mg Oral QHS Patrecia Pour, NP   100 mg at 03/14/16 2130   No current outpatient prescriptions on file.    Musculoskeletal: Strength & Muscle Tone: within normal limits Gait & Station: normal Patient leans: N/A  Psychiatric Specialty Exam: Physical Exam  Constitutional: He is oriented to person, place, and time. He appears well-developed and well-nourished.  HENT:  Head: Normocephalic.  Neck: Normal range of motion.  Respiratory: Effort normal.  Musculoskeletal: Normal range of motion.  Neurological: He is alert and oriented to person, place, and time.  Psychiatric: His speech is normal and behavior is normal. Judgment normal. His mood appears anxious. Thought content is paranoid. Cognition and memory are impaired.  He exhibits a depressed mood.    Review of Systems  Skin: Positive for rash.  Psychiatric/Behavioral: Positive for memory loss and substance abuse. The patient is nervous/anxious.   All other systems reviewed and are negative.   Blood pressure (!) 91/54, pulse (!) 57, temperature 98.3 F (36.8 C), temperature source Oral, resp. rate 17, SpO2 100 %.There is no height or weight on file to calculate BMI.  General Appearance: Disheveled  Eye Contact:  Minimal  Speech:  Normal Rate  Volume:  Decreased  Mood:  Anxious and Irritable  Affect:  Congruent  Thought Process:  Disorganized at times  Orientation:  Other:  person  Thought Content: Paranoia  Suicidal Thoughts:  No  Homicidal Thoughts:  No  Memory:  Immediate;   Poor Recent;   Poor Remote;   Poor  Judgement:  Impaired  Insight:  Lacking  Psychomotor Activity:  Decreased  Concentration:  Concentration: Poor and Attention Span: Poor  Recall:  Poor  Fund of Knowledge:  Fair  Language:  Fair  Akathisia:  No  Handed:  Right  AIMS (if indicated):     Assets:  Leisure Time Physical Health Resilience  ADL's:  Intact  Cognition:  Impaired,  Mild  Sleep:        Treatment Plan Summary: Daily contact with patient to assess and evaluate symptoms and progress in treatment, Medication management and Plan Amphetamine and psychostimulant-induced psychosis with hallucinations ([place])  -Crisis stabilization -Medication management:  Stated Tegretol 200 mg BID for mood stabilization and gabapentin 300 mg TID for withdrawal symptoms -Individual and substance abuse counseling  Disposition: Recommend psychiatric Inpatient admission when medically cleared.  Waylan Boga, NP 03/15/2016 12:21 PM  Patient seen face-to-face  for psychiatric evaluation, chart reviewed and case discussed with the physician extender and developed treatment plan. Reviewed the information documented and agree with the treatment plan. Corena Pilgrim, MD

## 2016-03-15 NOTE — ED Notes (Signed)
Pt roused and was able to answer some questions from Clinical research associatewriter. When questioned by as to what happened to bring him here, pt stated that his husband is trying to kill him. Than he needs to be quarinteened because his husband is trying to kill him with bot flies. Pt then became very animated stating that he has not eaten or seen a doctor since his arrival and would not accept reality reorientation. Pt provided with peanut butter and crackers and reminded as to when the doctors arrive and when meals come.

## 2016-03-15 NOTE — ED Notes (Signed)
Patient continues with bizarre behavior and is confused.  Hygiene is poor. No physical aggression. Continue 15' checks for safety.

## 2016-03-15 NOTE — Progress Notes (Signed)
Patient has been referred for inpatient treatment at the following facilities: [facility], [facility], [facility], [facility], [facility], [facility], [facility].  Per [facility] RN, patient did not want to disclose his address.  From previous admissions, patient's address has been [address], [place], [place] [address]. Address has been added to referral for coverage purposes. Currently, patient has no insurance.  Melbourne Abtsatia Symeon Puleo, LCSWA Disposition staff 03/15/2016 2:53 PM

## 2016-03-15 NOTE — ED Notes (Signed)
Pt refused vital sign assessment. 

## 2016-03-15 NOTE — ED Notes (Signed)
SBAR Report received from previous nurse. Pt received calm and visible on unit. Pt gave no meaningful answers to assessment questions related to SI/ HI, A/V H, depression, anxiety, or pain at this time, but appears otherwise stable and free of distress.  Pt reminded of camera surveillance, q 15 min rounds, and rules of the milieu. Will continue to assess. 

## 2016-03-16 DIAGNOSIS — F15951 Other stimulant use, unspecified with stimulant-induced psychotic disorder with hallucinations: Secondary | ICD-10-CM | POA: Diagnosis not present

## 2016-03-16 DIAGNOSIS — Z79899 Other long term (current) drug therapy: Secondary | ICD-10-CM | POA: Diagnosis not present

## 2016-03-16 MED ORDER — OLANZAPINE 10 MG PO TBDP
10.0000 mg | ORAL_TABLET | Freq: Two times a day (BID) | ORAL | Status: DC
  Administered 2016-03-16 – 2016-03-17 (×3): 10 mg via ORAL
  Filled 2016-03-16 (×4): qty 1

## 2016-03-16 NOTE — BH Assessment (Signed)
Pt denied by [facility] due to chronicity of illness.

## 2016-03-16 NOTE — BH Assessment (Signed)
Pt has been placed on [facility] [place] [facility] bed waitlist. 

## 2016-03-16 NOTE — Progress Notes (Signed)
D Pt. Is in bed and appears agitated when writer attempts to access him.  Pt. Reports high anxiety and depression but denies SI.  Pt. Also reports pain in his face d/t the sores.    A Writer offered support and encouragement.  Writer assured pt. That he had his zyprexa due shortly and his cream for his face as well as his HS medications would be brought in at 2100.    R Pt. Initially refused his zyprexa but with encouragement did accept it as well as gatorade and an HS snack from Clinical research associatewriter.  Pt remains safe on the unit and is presently resting quietly.

## 2016-03-16 NOTE — ED Notes (Signed)
[name] was quite loud this morning.  Started yelling about not being seen or cared for, [facility] [place] [facility] [place].  Continues to be paranoid and suspicious of staff.  He has taken all scheduled medications today, including the newly ordered olanzapine.  He is much quieter this afternoon.  Has been in his room most of the day, but has been up and interacting with peers some.  Safety checks maintained.

## 2016-03-16 NOTE — BH Assessment (Signed)
Pt placement referral resubmitted to the following facilities"   [facility]  [facility]  [facility]  [facility]

## 2016-03-16 NOTE — BH Assessment (Signed)
Patient meets criteria for inpatient hospitalization at this time per Dr. Lucianne MussKumar. Patient was placed under IVC and first examination was completed.   Joshua PokeJoVea Brandley Aldrete, LCSW Therapeutic Triage Specialist [facility] 03/16/2016 11:48 AM

## 2016-03-16 NOTE — Consult Note (Signed)
[facility] Consult   Reason for Consult:  Psychosis  Referring Physician:  EDP Patient Identification: Joshua Davila MRN:  [id number] Principal Diagnosis: Amphetamine and psychostimulant-induced psychosis with hallucinations [facility]) Diagnosis:   Patient Active Problem List   Diagnosis Date Noted  . Amphetamine abuse [F15.10] 03/14/2016    Priority: High  . Amphetamine and psychostimulant-induced psychosis with hallucinations [facility]) [F15.951] 03/14/2016    Priority: High    Total Time spent with patient: 30 minutes  Subjective:   Joshua Davila is a 35 y.o. male patient admitted with psychosis.  HPI:  On admission:  35 y.o. male presenting to [facility] with bizarre behaviors. When pt was asked about his presenting problem pt began pacing the room and requested paper and something to write with. Pt stated "I need some paper before I pass out or die". A thorough assessment could not be completed due to pt's mental status. Pt paced throughout the assessment and his speech was often incoherent. When pt was asked about substance abuse pt stated "poison my kind, I don't know what I had, they take your blood and then ask for extra".   Patient continues to be depressed with paranoia that people are trying to poison him and trying to kill him.  Confused at times, saying no doctor has seen him and this is not true, does not appear to remember anything about yesterday.    Past Psychiatric History: substance abuse   Risk to Self: Suicidal Ideation: No Suicidal Intent: No Is patient at risk for suicide?: No Suicidal Plan?: No Access to Means: No What has been your use of drugs/alcohol within the last 12 months?: Unable to assess Other Self Harm Risks: Denies  Intentional Self Injurious Behavior: None Risk to Others: None Prior Inpatient Therapy: Prior Inpatient Therapy: Yes Prior Therapy Dates: 2017 Prior Therapy Facilty/Provider(s): [facility] Reason for Treatment: Schizoaffective  Prior  Outpatient Therapy: Prior Outpatient Therapy: Yes Prior Therapy Dates: current  Prior Therapy Facilty/Provider(s): [facility]  Reason for Treatment: Schizoaffective  Does patient have an ACCT team?: No Does patient have Intensive In-House Services?  : No Does patient have [facility] services? : No Does patient have P4CC services?: No  Past Medical History: No past medical history on file. No past surgical history on file. Family History: No family history on file. Family Psychiatric  History: unknown Social History:  History  Alcohol use Not on file     History  Drug use: Unknown    Social History   Social History  . Marital status: Single    Spouse name: N/A  . Number of children: N/A  . Years of education: N/A   Social History Main Topics  . Smoking status: Not on file  . Smokeless tobacco: Not on file  . Alcohol use Not on file  . Drug use: Unknown  . Sexual activity: Not on file   Other Topics Concern  . Not on file   Social History Narrative  . No narrative on file   Additional Social History:    Allergies:   Allergies  Allergen Reactions  . Penicillins Swelling    Has patient had a PCN reaction causing immediate rash, facial/tongue/throat swelling, SOB or lightheadedness with hypotension: Yes Has patient had a PCN reaction causing severe rash involving mucus membranes or skin necrosis: No Has patient had a PCN reaction that required hospitalization Yes Has patient had a PCN reaction occurring within the last 10 years: No If all of the above answers are "NO",  then may proceed with Cephalosporin use.   . Sulfa Antibiotics Rash    Labs:  No results found for this or any previous visit (from the past 48 hour(s)).  Current Facility-Administered Medications  Medication Dose Route Frequency Provider Last Rate Last Dose  . acetaminophen (TYLENOL) tablet 650 mg  650 mg Oral Q6H PRN Tilden FossaElizabeth Rees, MD   650 mg at 03/15/16 [phone]  . carbamazepine (TEGRETOL XR) 12 hr  tablet 200 mg  200 mg Oral BID Charm RingsJamison Y Ladonya Jerkins, NP   200 mg at 03/16/16 0916  . gabapentin (NEURONTIN) capsule 300 mg  300 mg Oral TID Charm RingsJamison Y Jora Galluzzo, NP   300 mg at 03/16/16 0916  . hydrOXYzine (ATARAX/VISTARIL) tablet 25 mg  25 mg Oral Q6H PRN Charm RingsJamison Y Kecia Swoboda, NP   25 mg at 03/16/16 0753  . mupirocin cream (BACTROBAN) 2 %   Topical BID Jackelyn PolingJason A Berry, NP      . OLANZapine zydis (ZYPREXA) disintegrating tablet 10 mg  10 mg Oral BID Charm RingsJamison Y Timiya Howells, NP   10 mg at 03/16/16 1155  . traZODone (DESYREL) tablet 100 mg  100 mg Oral QHS Charm RingsJamison Y Iran Rowe, NP   100 mg at 03/15/16 2101   No current outpatient prescriptions on file.    Musculoskeletal: Strength & Muscle Tone: within normal limits Gait & Station: normal Patient leans: N/A  Psychiatric Specialty Exam: Physical Exam  Constitutional: He is oriented to person, place, and time. He appears well-developed and well-nourished.  HENT:  Head: Normocephalic.  Neck: Normal range of motion.  Respiratory: Effort normal.  Musculoskeletal: Normal range of motion.  Neurological: He is alert and oriented to person, place, and time.  Psychiatric: His speech is normal and behavior is normal. Judgment normal. His mood appears anxious. Thought content is paranoid. Cognition and memory are impaired. He exhibits a depressed mood.    Review of Systems  Skin: Positive for rash.  Psychiatric/Behavioral: Positive for memory loss and substance abuse. The patient is nervous/anxious.   All other systems reviewed and are negative.   Blood pressure 133/77, pulse 93, temperature 99.5 F (37.5 C), temperature source Oral, resp. rate 18, height 6' (1.829 m), weight 74 kg (163 lb 1.6 oz), SpO2 100 %.Body mass index is 22.12 kg/m.  General Appearance: Disheveled  Eye Contact:  Minimal  Speech:  Normal Rate  Volume:  Decreased  Mood:  Anxious and Irritable  Affect:  Congruent  Thought Process:  Disorganized at times  Orientation:  Other:  person  Thought Content:  Paranoia  Suicidal Thoughts:  No  Homicidal Thoughts:  No  Memory:  Immediate;   Poor Recent;   Poor Remote;   Poor  Judgement:  Impaired  Insight:  Lacking  Psychomotor Activity:  Decreased  Concentration:  Concentration: Poor and Attention Span: Poor  Recall:  Poor  Fund of Knowledge:  Fair  Language:  Fair  Akathisia:  No  Handed:  Right  AIMS (if indicated):     Assets:  Leisure Time Physical Health Resilience  ADL's:  Intact  Cognition:  Impaired,  Mild  Sleep:        Treatment Plan Summary: Daily contact with patient to assess and evaluate symptoms and progress in treatment, Medication management and Plan Amphetamine and psychostimulant-induced psychosis with hallucinations (HCC)  -Crisis stabilization -Medication management:  Stated Tegretol 200 mg BID for mood stabilization and gabapentin 300 mg TID for withdrawal symptoms -Individual and substance abuse counseling  Disposition: Recommend psychiatric Inpatient admission when medically  cleared.  Nanine MeansLORD, Tevion Laforge, NP 03/16/2016 3:45 PM  Patient seen face-to-face for psychiatric evaluation, chart reviewed and case discussed with the physician extender and developed treatment plan. Reviewed the information documented and agree with the treatment plan. Thedore MinsMojeed Akintayo, MD

## 2016-03-16 NOTE — ED Notes (Signed)
Patient requested that I call his mother and let her know what was going on with him.  He did sign a release and I called his mother.  I updated her on his current status.  She expressed a desire to have him moved to [facility].  I advised her that we had applied him to go to a psych hospital, including the [facility], but there was no guarantee that he would be accepted there.  She verbalized understanding.  Assured her that she could call for updates at any time.

## 2016-03-17 DIAGNOSIS — F15951 Other stimulant use, unspecified with stimulant-induced psychotic disorder with hallucinations: Secondary | ICD-10-CM | POA: Diagnosis not present

## 2016-03-17 DIAGNOSIS — Z79899 Other long term (current) drug therapy: Secondary | ICD-10-CM | POA: Diagnosis not present

## 2016-03-17 MED ORDER — STERILE WATER FOR INJECTION IJ SOLN
INTRAMUSCULAR | Status: AC
  Administered 2016-03-17: 10:00:00
  Filled 2016-03-17: qty 10

## 2016-03-17 MED ORDER — OLANZAPINE 10 MG IM SOLR
10.0000 mg | Freq: Once | INTRAMUSCULAR | Status: AC
  Administered 2016-03-17: 10 mg via INTRAMUSCULAR
  Filled 2016-03-17: qty 10

## 2016-03-17 MED ORDER — DIPHENHYDRAMINE HCL 50 MG/ML IJ SOLN
50.0000 mg | Freq: Once | INTRAMUSCULAR | Status: AC
  Administered 2016-03-17: 50 mg via INTRAMUSCULAR
  Filled 2016-03-17: qty 1

## 2016-03-17 NOTE — BH Assessment (Signed)
[facility] Assessment Progress Note  Per Nelly RoutArchana Kumar, MD, this pt requires psychiatric hospitalization at this time.  Pt presents under IVC initiated by Dr Lucianne MussKumar.  At 15:27 Revonda Standardllison calls from [facility] to report that pt has been accepted to their facility by Dr Shawnie DapperLopez.  Nanine MeansJamison Lord, [name], concurs with this decision.  Pt's nurse, Morrie Sheldonshley, has been notified, and agrees to call report to [phone].  Pt is under IVC and is to be [facility] [place] [place] [facility]  Doylene Canninghomas Kaladin Noseworthy, MA Triage Specialist [phone]

## 2016-03-17 NOTE — ED Notes (Signed)
Pt reports open areas on skin are getting worse, this nurse notified EDP.

## 2016-03-17 NOTE — Progress Notes (Signed)
03/17/16 1352:  LRT went to pt room to offer activities, pt was sleep.  Caroll RancherMarjette Ieshia Hatcher, LRT/CTRS

## 2016-03-17 NOTE — ED Notes (Signed)
Message left for sheriff for transport 

## 2016-03-17 NOTE — ED Notes (Signed)
Sheriff on unit to transfer [facility] per MD order. Pt irritable; Pt signed for personal property and property given to sheriff for transport. This nurse gave sheriff transfer paperwork. Pt ambulatory off unit with sheriff.

## 2016-03-17 NOTE — ED Notes (Signed)
[name] NP in pt room making morning rounds, this nurse present. Pt loud, verbally aggressive, irritable. Special checks q 15 mins in place for safety. Video monitoring in place.

## 2016-03-17 NOTE — Consult Note (Signed)
[facility]   Reason for Consult:  Psychosis  Referring Physician:  EDP Patient Identification: Joshua CablesJames Garlington MRN:  [id number] Principal Diagnosis: Amphetamine and psychostimulant-induced psychosis with hallucinations [facility](HCC) Diagnosis:   Patient Active Problem List   Diagnosis Date Noted  . Amphetamine abuse [F15.10] 03/14/2016    Priority: High  . Amphetamine and psychostimulant-induced psychosis with hallucinations [facility](HCC) [F15.951] 03/14/2016    Priority: High    Total Time spent with patient: 30 minutes  Subjective:   Joshua CablesJames Coppa is a 35 y.o. male patient admitted with psychosis.  HPI:  On admission:  35 y.o. male [facility]. When pt was asked about his presenting problem pt began pacing the room and requested paper and something to write with. Pt stated "I need some paper before I pass out or die". A thorough assessment could not be completed due to pt's mental status. Pt paced throughout the assessment and his speech was often incoherent. When pt was asked about substance abuse pt stated "poison my kind, I don't know what I had, they take your blood and then ask for extra".   Patient has cleared from his psychosis but agitated quickly when he was informed he had been assessed by a medical doctor when he demanded medical assistance for his skin irritation from meth use.  He started yelling, cursing, and threatening this NP.  "You lying fucking bitch."  PRN medications given.  No homicidal or suicidal ideations, hallucinations, and withdrawal symptoms.  He will be monitored until the am due to medications.  Past Psychiatric History: substance abuse   Risk to Self: Suicidal Ideation: No Suicidal Intent: No Is patient at risk for suicide?: No Suicidal Plan?: No Access to Means: No What has been your use of drugs/alcohol within the last 12 months?: Unable to assess Other Self Harm Risks: Denies  Intentional Self Injurious Behavior:  None Risk to Others: None Prior Inpatient Therapy: Prior Inpatient Therapy: Yes Prior Therapy Dates: 2017 [facility] Reason for Treatment: Schizoaffective  Prior Outpatient Therapy: Prior Outpatient Therapy: Yes Prior Therapy Dates: current  [facility]  Reason for Treatment: Schizoaffective  Does patient have an ACCT team?: No Does patient have Intensive In-House Services?  : No Does patient have [facility] services? : No Does patient have P4CC services?: No  Past Medical History: No past medical history on file. No past surgical history on file. Family History: No family history on file. Family Psychiatric  History: unknown Social History:  History  Alcohol use Not on file     History  Drug use: Unknown    Social History   Social History  . Marital status: Single    Spouse name: N/A  . Number of children: N/A  . Years of education: N/A   Social History Main Topics  . Smoking status: Not on file  . Smokeless tobacco: Not on file  . Alcohol use Not on file  . Drug use: Unknown  . Sexual activity: Not on file   Other Topics Concern  . Not on file   Social History Narrative  . No narrative on file   Additional Social History:    Allergies:   Allergies  Allergen Reactions  . Penicillins Swelling    Has patient had a PCN reaction causing immediate rash, facial/tongue/throat swelling, SOB or lightheadedness with hypotension: Yes Has patient had a PCN reaction causing severe rash involving mucus membranes or skin necrosis: No Has patient had  a PCN reaction that required hospitalization Yes Has patient had a PCN reaction occurring within the last 10 years: No If all of the above answers are "NO", then may proceed with Cephalosporin use.   . Sulfa Antibiotics Rash    Labs:  No results found for this or any previous visit (from the past 48 hour(s)).  Current Facility-Administered Medications   Medication Dose Route Frequency Provider Last Rate Last Dose  . acetaminophen (TYLENOL) tablet 650 mg  650 mg Oral Q6H PRN Tilden FossaElizabeth Rees, MD   650 mg at 03/15/16 [phone]  . carbamazepine (TEGRETOL XR) 12 hr tablet 200 mg  200 mg Oral BID Charm RingsJamison Y Lord, NP   200 mg at 03/17/16 1010  . gabapentin (NEURONTIN) capsule 300 mg  300 mg Oral TID Charm RingsJamison Y Lord, NP   300 mg at 03/17/16 1010  . hydrOXYzine (ATARAX/VISTARIL) tablet 25 mg  25 mg Oral Q6H PRN Charm RingsJamison Y Lord, NP   25 mg at 03/16/16 0753  . mupirocin cream (BACTROBAN) 2 %   Topical BID Jackelyn PolingJason A Berry, NP      . OLANZapine zydis (ZYPREXA) disintegrating tablet 10 mg  10 mg Oral BID Charm RingsJamison Y Lord, NP   10 mg at 03/17/16 1010  . traZODone (DESYREL) tablet 100 mg  100 mg Oral QHS Charm RingsJamison Y Lord, NP   100 mg at 03/16/16 2129   No current outpatient prescriptions on file.    Musculoskeletal: Strength & Muscle Tone: within normal limits Gait & Station: normal Patient leans: N/A  Psychiatric Specialty Exam: Physical Exam  Constitutional: He is oriented to person, place, and time. He appears well-developed and well-nourished.  HENT:  Head: Normocephalic.  Neck: Normal range of motion.  Respiratory: Effort normal.  Musculoskeletal: Normal range of motion.  Neurological: He is alert and oriented to person, place, and time.  Psychiatric: His speech is normal. Judgment and thought content normal. His mood appears anxious. He is agitated. Cognition and memory are impaired.    Review of Systems  Skin: Positive for rash.  Psychiatric/Behavioral: Positive for substance abuse. The patient is nervous/anxious.   All other systems reviewed and are negative.   Blood pressure 101/69, pulse 77, temperature 98.3 F (36.8 C), temperature source Oral, resp. rate 16, height 6' (1.829 m), weight 74 kg (163 lb 1.6 oz), SpO2 100 %.Body mass index is 22.12 kg/m.  General Appearance: Disheveled  Eye Contact:  Good  Speech:  Normal Rate  Volume:  Normal   Mood:  Irritable   Affect:  Congruent  Thought Process:  Clear and coherent  Orientation:  Person, place, and situation  Thought Content: WDL  Suicidal Thoughts:  No  Homicidal Thoughts:  No  Memory:  Immediate;   Poor Recent;   Poor Remote;   Poor  Judgement:  Impaired  Insight:  Lacking  Psychomotor Activity:  Normal  Concentration:  Concentration: Poor and Attention Span: Poor  Recall:  Poor  Fund of Knowledge:  Fair  Language:  Fair  Akathisia:  No  Handed:  Right  AIMS (if indicated):     Assets:  Leisure Time Physical Health Resilience  ADL's:  Intact  Cognition:  Impaired,  Mild  Sleep:        Treatment Plan Summary: Daily contact with patient to assess and evaluate symptoms and progress in treatment, Medication management and Plan Amphetamine and psychostimulant-induced psychosis with hallucinations (HCC)  -Crisis stabilization -Medication management:  Continued Zyprexa 10 mg BID for psychosis, Tegretol 200 mg BID  for mood stabilization and gabapentin 300 mg TID for withdrawal symptoms.  PRN medications needed this am Zyprexa 10 mg IM and Benadryl 50 mg IM for agitation. -Individual and substance abuse counseling -Transfer to [facility]  Disposition: Recommend psychiatric Inpatient admission when medically cleared.  Nanine Means, NP 03/17/2016 12:28 PM  Patient seen face-to-face for psychiatric evaluation, chart reviewed and case discussed with the physician extender and developed treatment plan. Reviewed the information documented and agree with the treatment plan. Thedore Mins, MD

## 2016-03-18 DIAGNOSIS — R21 Rash and other nonspecific skin eruption: Secondary | ICD-10-CM | POA: Diagnosis not present

## 2016-03-18 DIAGNOSIS — F250 Schizoaffective disorder, bipolar type: Secondary | ICD-10-CM | POA: Diagnosis not present

## 2016-03-19 DIAGNOSIS — F250 Schizoaffective disorder, bipolar type: Secondary | ICD-10-CM | POA: Diagnosis not present

## 2016-03-20 DIAGNOSIS — F250 Schizoaffective disorder, bipolar type: Secondary | ICD-10-CM | POA: Diagnosis not present

## 2016-03-21 DIAGNOSIS — F250 Schizoaffective disorder, bipolar type: Secondary | ICD-10-CM | POA: Diagnosis not present

## 2016-03-23 DIAGNOSIS — F250 Schizoaffective disorder, bipolar type: Secondary | ICD-10-CM | POA: Diagnosis not present

## 2016-03-24 DIAGNOSIS — F250 Schizoaffective disorder, bipolar type: Secondary | ICD-10-CM | POA: Diagnosis not present

## 2016-03-25 DIAGNOSIS — F250 Schizoaffective disorder, bipolar type: Secondary | ICD-10-CM | POA: Diagnosis not present

## 2016-03-26 DIAGNOSIS — F250 Schizoaffective disorder, bipolar type: Secondary | ICD-10-CM | POA: Diagnosis not present

## 2016-03-27 DIAGNOSIS — F250 Schizoaffective disorder, bipolar type: Secondary | ICD-10-CM | POA: Diagnosis not present

## 2016-03-28 DIAGNOSIS — F250 Schizoaffective disorder, bipolar type: Secondary | ICD-10-CM | POA: Diagnosis not present

## 2016-03-30 DIAGNOSIS — F250 Schizoaffective disorder, bipolar type: Secondary | ICD-10-CM | POA: Diagnosis not present

## 2016-04-12 ENCOUNTER — Emergency Department (HOSPITAL_COMMUNITY): Payer: Medicare Other

## 2016-04-12 ENCOUNTER — Encounter (HOSPITAL_COMMUNITY): Payer: Self-pay | Admitting: *Deleted

## 2016-04-12 ENCOUNTER — Emergency Department (HOSPITAL_COMMUNITY)
Admission: EM | Admit: 2016-04-12 | Discharge: 2016-04-12 | Disposition: A | Discharge status: Home / Self Care | Payer: Medicare Other | Attending: Emergency Medicine | Admitting: Emergency Medicine

## 2016-04-12 DIAGNOSIS — S299XXA Unspecified injury of thorax, initial encounter: Secondary | ICD-10-CM | POA: Diagnosis not present

## 2016-04-12 DIAGNOSIS — R0781 Pleurodynia: Secondary | ICD-10-CM | POA: Insufficient documentation

## 2016-04-12 DIAGNOSIS — R443 Hallucinations, unspecified: Secondary | ICD-10-CM | POA: Diagnosis not present

## 2016-04-12 DIAGNOSIS — F17210 Nicotine dependence, cigarettes, uncomplicated: Secondary | ICD-10-CM | POA: Diagnosis not present

## 2016-04-12 DIAGNOSIS — F23 Brief psychotic disorder: Secondary | ICD-10-CM | POA: Diagnosis not present

## 2016-04-12 DIAGNOSIS — Z79899 Other long term (current) drug therapy: Secondary | ICD-10-CM | POA: Insufficient documentation

## 2016-04-12 NOTE — Discharge Instructions (Signed)
Return here as needed.  Follow-up with your primary care doctor.  Ice and heat on your ribs.  The x-rays did not show any abnormality
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
## 2016-04-12 NOTE — ED Notes (Signed)
Pt will only sit tin the chair he says his ribs are hurting to bad to be touched to get EKG.
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
## 2016-04-12 NOTE — ED Triage Notes (Signed)
Pt given discharge instructions and escorted to front door with directions to bus stop. He come back in, [employer]/Security aware to assist in leaving.

## 2016-04-12 NOTE — ED Notes (Signed)
Bed: WA17 Expected date:  Expected time:  Means of arrival:  Comments: [employer]- IVC
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
## 2016-04-12 NOTE — ED Triage Notes (Signed)
Writer has spoken with [employer], no IVC paperwork is in process after confirming with magistrate. Spoke with Thayer [place]ris, [place] who does not feel he needs to be IVCd after assessing pt. Pt has false name of next of contact on chart. Pt will not give phone number for husband but will provide address. Pt addiment that he does not want staff to speak with husband or try and contact him. He in rambling in conversation and in and out of room. Pt still  Will not allow RN to assess. Attempting to again to obtain VS.

## 2016-04-12 NOTE — ED Triage Notes (Signed)
Pt refusing treatment at present, EMS states husband has gone to take out IVC paperwork. Will continue to monitor until paperwork arrives.

## 2016-04-12 NOTE — ED Notes (Signed)
PT REFUSING BLOOD WORK AT THIS TIME RN AWARE

## 2016-05-29 ENCOUNTER — Emergency Department (HOSPITAL_COMMUNITY)
Admission: EM | Admit: 2016-05-29 | Discharge: 2016-05-31 | Disposition: A | Discharge status: Home / Self Care | Payer: Medicare Other | Attending: Emergency Medicine | Admitting: Emergency Medicine

## 2016-05-29 ENCOUNTER — Encounter (HOSPITAL_COMMUNITY): Payer: Self-pay | Admitting: Emergency Medicine

## 2016-05-29 DIAGNOSIS — Z046 Encounter for general psychiatric examination, requested by authority: Secondary | ICD-10-CM | POA: Diagnosis present

## 2016-05-29 DIAGNOSIS — F15951 Other stimulant use, unspecified with stimulant-induced psychotic disorder with hallucinations: Secondary | ICD-10-CM | POA: Diagnosis not present

## 2016-05-29 DIAGNOSIS — Z79899 Other long term (current) drug therapy: Secondary | ICD-10-CM | POA: Insufficient documentation

## 2016-05-29 DIAGNOSIS — F1290 Cannabis use, unspecified, uncomplicated: Secondary | ICD-10-CM | POA: Diagnosis not present

## 2016-05-29 DIAGNOSIS — F15959 Other stimulant use, unspecified with stimulant-induced psychotic disorder, unspecified: Secondary | ICD-10-CM | POA: Diagnosis not present

## 2016-05-29 DIAGNOSIS — F29 Unspecified psychosis not due to a substance or known physiological condition: Secondary | ICD-10-CM | POA: Diagnosis not present

## 2016-05-29 DIAGNOSIS — F17210 Nicotine dependence, cigarettes, uncomplicated: Secondary | ICD-10-CM | POA: Insufficient documentation

## 2016-05-29 DIAGNOSIS — Z818 Family history of other mental and behavioral disorders: Secondary | ICD-10-CM | POA: Diagnosis not present

## 2016-05-29 LAB — CBC
HEMATOCRIT: 39.4 % (ref 39.0–52.0)
HEMOGLOBIN: 13.9 g/dL (ref 13.0–17.0)
MCH: 28.7 pg (ref 26.0–34.0)
MCHC: 35.3 g/dL (ref 30.0–36.0)
MCV: 81.4 fL (ref 78.0–100.0)
Platelets: 236 10*3/uL (ref 150–400)
RBC: 4.84 MIL/uL (ref 4.22–5.81)
RDW: 14.1 % (ref 11.5–15.5)
WBC: 9.7 10*3/uL (ref 4.0–10.5)

## 2016-05-29 LAB — COMPREHENSIVE METABOLIC PANEL
ALT: 30 U/L (ref 17–63)
AST: 29 U/L (ref 15–41)
Albumin: 4.4 g/dL (ref 3.5–5.0)
Alkaline Phosphatase: 53 U/L (ref 38–126)
Anion gap: 4 — ABNORMAL LOW (ref 5–15)
BILIRUBIN TOTAL: 0.6 mg/dL (ref 0.3–1.2)
BUN: 9 mg/dL (ref 6–20)
CO2: 27 mmol/L (ref 22–32)
Calcium: 9.4 mg/dL (ref 8.9–10.3)
Chloride: 108 mmol/L (ref 101–111)
Creatinine, Ser: 0.73 mg/dL (ref 0.61–1.24)
Glucose, Bld: 125 mg/dL — ABNORMAL HIGH (ref 65–99)
POTASSIUM: 3.5 mmol/L (ref 3.5–5.1)
Sodium: 139 mmol/L (ref 135–145)
TOTAL PROTEIN: 7.9 g/dL (ref 6.5–8.1)

## 2016-05-29 LAB — RAPID URINE DRUG SCREEN, HOSP PERFORMED
AMPHETAMINES: POSITIVE — AB
BARBITURATES: NOT DETECTED
Benzodiazepines: NOT DETECTED
COCAINE: NOT DETECTED
OPIATES: NOT DETECTED
TETRAHYDROCANNABINOL: POSITIVE — AB

## 2016-05-29 LAB — SALICYLATE LEVEL

## 2016-05-29 LAB — ACETAMINOPHEN LEVEL

## 2016-05-29 LAB — ETHANOL

## 2016-05-29 MED ORDER — LORAZEPAM 1 MG PO TABS: 1.0000 mg | ORAL_TABLET | Freq: Three times a day (TID) | ORAL | Status: DC | PRN

## 2016-05-29 MED ORDER — ZIPRASIDONE MESYLATE 20 MG IM SOLR
10.0000 mg | INTRAMUSCULAR | Status: DC | PRN
  Administered 2016-05-30: 10 mg via INTRAMUSCULAR
  Filled 2016-05-29: qty 20

## 2016-05-29 MED ORDER — IBUPROFEN 200 MG PO TABS: 600.0000 mg | ORAL_TABLET | Freq: Three times a day (TID) | ORAL | Status: DC | PRN

## 2016-05-29 MED ORDER — STERILE WATER FOR INJECTION IJ SOLN
INTRAMUSCULAR | Status: AC
  Administered 2016-05-29: 20 mL
  Filled 2016-05-29: qty 10

## 2016-05-29 MED ORDER — ONDANSETRON HCL 4 MG PO TABS: 4.0000 mg | ORAL_TABLET | Freq: Three times a day (TID) | ORAL | Status: DC | PRN

## 2016-05-29 MED ORDER — ZIPRASIDONE MESYLATE 20 MG IM SOLR
10.0000 mg | Freq: Once | INTRAMUSCULAR | Status: AC | PRN
  Administered 2016-05-29: 10 mg via INTRAMUSCULAR
  Filled 2016-05-29: qty 20

## 2016-05-29 MED ORDER — LORAZEPAM 2 MG/ML IJ SOLN
2.0000 mg | Freq: Once | INTRAMUSCULAR | Status: AC | PRN
  Administered 2016-05-29: 2 mg via INTRAMUSCULAR
  Filled 2016-05-29: qty 1

## 2016-05-29 MED ORDER — NICOTINE 21 MG/24HR TD PT24
21.0000 mg | MEDICATED_PATCH | Freq: Every day | TRANSDERMAL | Status: DC
  Administered 2016-05-30 – 2016-05-31 (×2): 21 mg via TRANSDERMAL
  Filled 2016-05-29 (×2): qty 1
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
## 2016-05-29 NOTE — ED Notes (Signed)
Bed: [id number] Expected date:  Expected time:  Means of arrival:  Comments: Hold Aggressive IVC/[employer]
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
## 2016-05-29 NOTE — ED Notes (Signed)
TTS unable to assess pt at this time--- pt is too sleepy; will re-order TTS consult when pt is more awake.

## 2016-05-30 MED ORDER — LORAZEPAM 2 MG/ML IJ SOLN
2.0000 mg | Freq: Once | INTRAMUSCULAR | Status: AC | PRN
  Administered 2016-05-30: 2 mg via INTRAMUSCULAR

## 2016-05-30 MED ORDER — STERILE WATER FOR INJECTION IJ SOLN
INTRAMUSCULAR | Status: AC
  Administered 2016-05-30: 21:00:00
  Filled 2016-05-30: qty 10

## 2016-05-30 MED ORDER — GABAPENTIN 300 MG PO CAPS
300.0000 mg | ORAL_CAPSULE | Freq: Three times a day (TID) | ORAL | Status: DC
  Administered 2016-05-30 – 2016-05-31 (×3): 300 mg via ORAL
  Filled 2016-05-30 (×3): qty 1

## 2016-05-30 MED ORDER — LORAZEPAM 2 MG/ML IJ SOLN
INTRAMUSCULAR | Status: AC
  Administered 2016-05-30: 2 mg via INTRAMUSCULAR
  Filled 2016-05-30: qty 1

## 2016-05-30 MED ORDER — HYDROXYZINE HCL 25 MG PO TABS: 50.0000 mg | ORAL_TABLET | Freq: Four times a day (QID) | ORAL | Status: DC | PRN

## 2016-05-30 MED ORDER — TRAZODONE HCL 50 MG PO TABS
50.0000 mg | ORAL_TABLET | Freq: Every evening | ORAL | Status: DC | PRN
Start: 2016-05-30 — End: 2016-05-31
  Filled 2016-05-30: qty 1

## 2016-05-30 MED ORDER — QUETIAPINE FUMARATE 100 MG PO TABS
200.0000 mg | ORAL_TABLET | Freq: Three times a day (TID) | ORAL | Status: DC
  Administered 2016-05-30 – 2016-05-31 (×2): 200 mg via ORAL
  Filled 2016-05-30 (×3): qty 2

## 2016-05-30 NOTE — Progress Notes (Signed)
CSW filed patient's examination and recommendation paperwork into IVC logbook.  Humzah Harty, LCSWA [facility]  Clinical Social Worker [phone] 

## 2016-05-30 NOTE — ED Notes (Signed)
Pt up demanding to see an MD, demanding to call the [facility] or 911. Pt quickly escilating from quietly mumbling/ talking to self to ripping open the cabinet in his room, throwing his trey table across the room, loud and pressured speech, and making unrealistic demands. Pt unable to accept redirection and quickly escilating, JB NP called for emergency medication orders.

## 2016-05-30 NOTE — Progress Notes (Signed)
Pt presents with blunted affect and irritable mood on intermittent contact. Pt denies SI, HI, AVH and pain "I'm ok". Pt is guarded and with drawned to his room. Minimal interactions noted with others on as need basis. Observed asleep majority of this shift. OOB for medications, meals and bathroom use. Tolerated all PO intake well. Compliant with medications as prescribed, denies adverse drug reactions. Q 15 minutes safety checks maintained without outburst or self harm gestures to note at this time.
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
## 2016-05-30 NOTE — ED Notes (Signed)
Patient refused vitals RN Darel HongJudy made aware of refusal.

## 2016-05-30 NOTE — ED Notes (Signed)
SBAR Report received from previous nurse. Pt received calm and resting on unit. Pt gave no meaningful answers to assessment questions related to current SI/ HI, A/V H, depression, anxiety, or pain at this time, and appears otherwise stable and free of distress. Pt reminded of camera surveillance, q 15 min rounds, and rules of the milieu. Will continue to assess.

## 2016-05-31 DIAGNOSIS — F1490 Cocaine use, unspecified, uncomplicated: Secondary | ICD-10-CM

## 2016-05-31 DIAGNOSIS — Z818 Family history of other mental and behavioral disorders: Secondary | ICD-10-CM

## 2016-05-31 DIAGNOSIS — F1290 Cannabis use, unspecified, uncomplicated: Secondary | ICD-10-CM

## 2016-05-31 DIAGNOSIS — Z79899 Other long term (current) drug therapy: Secondary | ICD-10-CM

## 2016-05-31 DIAGNOSIS — F15951 Other stimulant use, unspecified with stimulant-induced psychotic disorder with hallucinations: Secondary | ICD-10-CM | POA: Diagnosis not present

## 2016-05-31 DIAGNOSIS — Z880 Allergy status to penicillin: Secondary | ICD-10-CM

## 2016-05-31 DIAGNOSIS — F1590 Other stimulant use, unspecified, uncomplicated: Secondary | ICD-10-CM

## 2016-05-31 DIAGNOSIS — Z882 Allergy status to sulfonamides status: Secondary | ICD-10-CM

## 2016-05-31 DIAGNOSIS — F17210 Nicotine dependence, cigarettes, uncomplicated: Secondary | ICD-10-CM | POA: Diagnosis not present

## 2016-05-31 NOTE — Consult Note (Signed)
[facility] Consult   Reason for Consult:  Methamphetamine abuse with threats to others Referring Physician:  EDP Patient Identification: Joshua Davila MRN:  [id number] Principal Diagnosis: Amphetamine and psychostimulant-induced psychosis with hallucinations [facility]) Diagnosis:   Patient Active Problem List   Diagnosis Date Noted  . Amphetamine abuse [F15.10] 03/14/2016    Priority: High  . Amphetamine and psychostimulant-induced psychosis with hallucinations [facility]) [F15.951] 03/14/2016    Priority: High  . PTSD (post-traumatic stress disorder) [F43.10] 10/03/2013    Priority: High  . Methamphetamine use disorder, severe ([place]) [F15.20] 06/20/2015    Priority: Low  . Polysubstance dependence ([place]) [F19.20] 10/02/2013    Priority: Low  . Polysubstance abuse [F19.10]   . Schizoaffective disorder, bipolar type ([place]) [F25.0] 10/02/2015  . Involuntary commitment [Z04.6] 10/02/2015  . Cannabis use disorder, moderate, dependence ([place]) [F12.20] 09/16/2015  . Opioid use disorder, moderate, dependence ([place]) [F11.20] 09/16/2015  . Substance or medication-induced bipolar and related disorder with onset during intoxication ([place]) [F19.94] 09/16/2015  . 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 Benzodiazepine dependence ([place]) [F13.20] 10/04/2013    Total Time spent with patient: 45 minutes  Subjective:   Joshua Davila is a 36 y.o. male patient is stable for discharge.  HPI:  36 yo male who presented to the ED after being IVC'd for threatening his roommates while under the influence of meth.  No suicidal/homicidal ideations today-clear and coherent.  No hallucinations or withdrawal symptoms noted.  He is upset that everyone in his house uses drugs and would like them to leave, encouraged eviction notices.  Stable for dishcarge.  Past Psychiatric History: substance abuse, PTSD  Risk to Self: None Risk  to Others: None Prior Inpatient Therapy: Prior Inpatient Therapy:  (UTA) Prior Therapy Dates: UTA Prior Therapy Facilty/Provider(s): UTA Reason for Treatment: UTA Prior Outpatient Therapy: Prior Outpatient Therapy:  (UTA) Prior Therapy Dates: UTA Prior Therapy Facilty/Provider(s): UTA Reason for Treatment: UTA Does patient have an ACCT team?: Unknown Does patient have Intensive In-House Services?  : Unknown Does patient have [facility] services? : Unknown Does patient have P4CC services?: Unknown  Past Medical History:  Past Medical History:  Diagnosis Date  . Depression   . Gender identity disorder   . Liver disorder   . PTSD (post-traumatic stress disorder)   . Schizoaffective disorder ([place])    History reviewed. No pertinent surgical history. Family History:  Family History  Problem Relation Age of Onset  . Multiple sclerosis Mother   . Mental illness Neg Hx    Family Psychiatric  History: none Social History:  History  Alcohol Use  . Yes    Comment: occasional     History  Drug Use  . Types: Cocaine, Methamphetamines, Marijuana    Comment: last useof methamphetamines and marijuana yesterday    Social History   Social History  . Marital status: Single    Spouse name: N/A  . Number of children: N/A  . Years of education: N/A   Social History Main Topics  . Smoking status: Current Every Day Smoker    Packs/day: 1.00    Types: Cigarettes  . Smokeless tobacco: Never Used  . Alcohol use Yes     Comment: occasional  . Drug use: Yes    Types: Cocaine, Methamphetamines, Marijuana     Comment: last useof methamphetamines and marijuana yesterday  . Sexual activity: Not Currently   Other Topics Concern  . None   Social  History Narrative  . None   Additional Social History:    Allergies:   Allergies  Allergen Reactions  . Penicillins Swelling    Has patient had a PCN reaction causing immediate rash, facial/tongue/throat swelling, SOB or lightheadedness  with hypotension: Yes Has patient had a PCN reaction causing severe rash involving mucus membranes or skin necrosis: No Has patient had a PCN reaction that required hospitalization Yes Has patient had a PCN reaction occurring within the last 10 years: No If all of the above answers are "NO", then may proceed with Cephalosporin use.   Marland Kitchen Penicillins Swelling and Rash    Has patient had a PCN reaction causing immediate rash, facial/tongue/throat swelling, SOB or lightheadedness with hypotension: yes Has patient had a PCN reaction causing severe rash involving mucus membranes or skin necrosis: no Has patient had a PCN reaction that required hospitalization: yes Has patient had a PCN reaction occurring within the last 10 years: no If all of the above answers are "NO", then may proceed with Cephalosporin use.   . Sulfa Antibiotics Rash    Labs:  Results for orders placed or performed during the hospital encounter of 05/29/16 (from the past 48 hour(s))  Rapid urine drug screen (hospital performed)     Status: Abnormal   Collection Time: 05/29/16  9:00 PM  Result Value Ref Range   Opiates NONE DETECTED NONE DETECTED   Cocaine NONE DETECTED NONE DETECTED   Benzodiazepines NONE DETECTED NONE DETECTED   Amphetamines POSITIVE (A) NONE DETECTED   Tetrahydrocannabinol POSITIVE (A) NONE DETECTED   Barbiturates NONE DETECTED NONE DETECTED    Comment:        DRUG SCREEN FOR MEDICAL PURPOSES ONLY.  IF CONFIRMATION IS NEEDED FOR ANY PURPOSE, NOTIFY LAB WITHIN 5 DAYS.        LOWEST DETECTABLE LIMITS FOR URINE DRUG SCREEN Drug Class       Cutoff (ng/mL) Amphetamine      1000 Barbiturate      200 Benzodiazepine   354 Tricyclics       562 Opiates          300 Cocaine          300 THC              50   Comprehensive metabolic panel     Status: Abnormal   Collection Time: 05/29/16  9:41 PM  Result Value Ref Range   Sodium 139 135 - 145 mmol/L   Potassium 3.5 3.5 - 5.1 mmol/L   Chloride 108  101 - 111 mmol/L   CO2 27 22 - 32 mmol/L   Glucose, Bld 125 (H) 65 - 99 mg/dL   BUN 9 6 - 20 mg/dL   Creatinine, Ser 0.73 0.61 - 1.24 mg/dL   Calcium 9.4 8.9 - 10.3 mg/dL   Total Protein 7.9 6.5 - 8.1 g/dL   Albumin 4.4 3.5 - 5.0 g/dL   AST 29 15 - 41 U/L   ALT 30 17 - 63 U/L   Alkaline Phosphatase 53 38 - 126 U/L   Total Bilirubin 0.6 0.3 - 1.2 mg/dL   GFR calc non Af Amer >60 >60 mL/min   GFR calc Af Amer >60 >60 mL/min    Comment: (NOTE) The eGFR has been calculated using the CKD EPI equation. This calculation has not been validated in all clinical situations. eGFR's persistently <60 mL/min signify possible Chronic Kidney Disease.    Anion gap 4 (L) 5 - 15  Ethanol  Status: None   Collection Time: 05/29/16  9:41 PM  Result Value Ref Range   Alcohol, Ethyl (B) <5 <5 mg/dL    Comment:        LOWEST DETECTABLE LIMIT FOR SERUM ALCOHOL IS 5 mg/dL FOR MEDICAL PURPOSES ONLY   Salicylate level     Status: None   Collection Time: 05/29/16  9:41 PM  Result Value Ref Range   Salicylate Lvl <7.0 2.8 - 30.0 mg/dL  Acetaminophen level     Status: Abnormal   Collection Time: 05/29/16  9:41 PM  Result Value Ref Range   Acetaminophen (Tylenol), Serum <10 (L) 10 - 30 ug/mL    Comment:        THERAPEUTIC CONCENTRATIONS VARY SIGNIFICANTLY. A RANGE OF 10-30 ug/mL MAY BE AN EFFECTIVE CONCENTRATION FOR MANY PATIENTS. HOWEVER, SOME ARE BEST TREATED AT CONCENTRATIONS OUTSIDE THIS RANGE. ACETAMINOPHEN CONCENTRATIONS >150 ug/mL AT 4 HOURS AFTER INGESTION AND >50 ug/mL AT 12 HOURS AFTER INGESTION ARE OFTEN ASSOCIATED WITH TOXIC REACTIONS.   cbc     Status: None   Collection Time: 05/29/16  9:41 PM  Result Value Ref Range   WBC 9.7 4.0 - 10.5 K/uL   RBC 4.84 4.22 - 5.81 MIL/uL   Hemoglobin 13.9 13.0 - 17.0 g/dL   HCT 39.4 39.0 - 52.0 %   MCV 81.4 78.0 - 100.0 fL   MCH 28.7 26.0 - 34.0 pg   MCHC 35.3 30.0 - 36.0 g/dL   RDW 14.1 11.5 - 15.5 %   Platelets 236 150 - 400 K/uL     Current Facility-Administered Medications  Medication Dose Route Frequency Provider Last Rate Last Dose  . gabapentin (NEURONTIN) capsule 300 mg  300 mg Oral TID Patrecia Pour, NP   300 mg at 05/31/16 0915  . hydrOXYzine (ATARAX/VISTARIL) tablet 50 mg  50 mg Oral Q6H PRN Patrecia Pour, NP      . ibuprofen (ADVIL,MOTRIN) tablet 600 mg  600 mg Oral Q8H PRN Merrily Pew, MD      . nicotine (NICODERM CQ - dosed in mg/24 hours) patch 21 mg  21 mg Transdermal Daily Merrily Pew, MD   21 mg at 05/31/16 0916  . ondansetron (ZOFRAN) tablet 4 mg  4 mg Oral Q8H PRN Merrily Pew, MD      . QUEtiapine (SEROQUEL) tablet 200 mg  200 mg Oral TID Patrecia Pour, NP   200 mg at 05/31/16 0914  . traZODone (DESYREL) tablet 50 mg  50 mg Oral QHS PRN Patrecia Pour, NP      . ziprasidone (GEODON) injection 10 mg  10 mg Intramuscular Q2H PRN Merrily Pew, MD   10 mg at 05/30/16 2118   Current Outpatient Prescriptions  Medication Sig Dispense Refill  . acetaminophen (TYLENOL) 325 MG tablet Take 2 tablets (650 mg total) by mouth every 4 (four) hours as needed for mild pain or fever (T > 38.3Celsius). (Patient not taking: Reported on 05/30/2016)    . alum & mag hydroxide-simeth (MAALOX/MYLANTA) 200-200-20 MG/5ML suspension Take 30 mLs by mouth as needed for indigestion or heartburn. (Patient not taking: Reported on 05/30/2016) 355 mL 0  . atomoxetine (STRATTERA) 40 MG capsule Take 1 capsule (40 mg total) by mouth daily. For ADHD/depression (Patient not taking: Reported on 05/30/2016) 30 capsule 0  . atorvastatin (LIPITOR) 20 MG tablet Take 1 tablet (20 mg total) by mouth daily. For high Cholesterol (Patient not taking: Reported on 05/30/2016) 1 tablet 0  . benztropine (COGENTIN) 0.5  MG tablet Take 1 tablet (0.5 mg total) by mouth 2 (two) times daily. For prevention of drug induced tremors (Patient not taking: Reported on 05/30/2016) 60 tablet 0  . divalproex (DEPAKOTE) 250 MG DR tablet Take 1 tablet (250 mg total) by  mouth daily after breakfast. For mood stabilization (Patient not taking: Reported on 05/30/2016) 30 tablet 0  . divalproex (DEPAKOTE) 500 MG DR tablet Take 2 tablets (1,000 mg total) by mouth daily at 8 pm. For mood stabilization (Patient not taking: Reported on 05/30/2016) 60 tablet 0  . emtricitabine-tenofovir (TRUVADA) 200-300 MG tablet Take 1 tablet by mouth daily. For pre-exposure to HIV (Patient not taking: Reported on 05/30/2016)    . ibuprofen (ADVIL,MOTRIN) 600 MG tablet Take 1 tablet (600 mg total) by mouth every 8 (eight) hours as needed (Temp > 38.3Celsius). For pain (Patient not taking: Reported on 05/30/2016) 1 tablet 0  . lidocaine (LIDODERM) 5 % Place 1 patch onto the skin daily. Remove & Discard patch within 12 hours or as directed by MD: For pain management (Patient not taking: Reported on 05/30/2016) 7 patch 0  . LORazepam (ATIVAN) 1 MG tablet Take 1 tablet (1 mg total) by mouth every 8 (eight) hours as needed for anxiety (agitation). (Patient not taking: Reported on 05/30/2016) 12 tablet 0  . methocarbamol (ROBAXIN) 500 MG tablet Take 1 tablet (500 mg total) by mouth every 8 (eight) hours as needed for muscle spasms. (Patient not taking: Reported on 05/30/2016) 9 tablet 0  . nicotine polacrilex (NICORETTE) 2 MG gum Take 1 each (2 mg total) by mouth as needed for smoking cessation. (Patient not taking: Reported on 05/30/2016) 100 tablet 0  . ondansetron (ZOFRAN) 4 MG tablet Take 1 tablet (4 mg total) by mouth every 8 (eight) hours as needed for nausea or vomiting. (Patient not taking: Reported on 05/30/2016) 8 tablet 0  . PARoxetine (PAXIL) 20 MG tablet Take 1 tablet (20 mg total) by mouth daily. For depression (Patient not taking: Reported on 05/30/2016)    . traZODone (DESYREL) 50 MG tablet Take 1 tablet (50 mg total) by mouth at bedtime as needed for sleep. (Patient not taking: Reported on 05/30/2016) 30 tablet 0  . ziprasidone (GEODON) 20 MG capsule Take 1 capsule (20 mg total) by mouth daily  with supper. For mood control (Patient not taking: Reported on 05/30/2016) 30 capsule 0    Musculoskeletal: Strength & Muscle Tone: within normal limits Gait & Station: normal Patient leans: N/A  Psychiatric Specialty Exam: Physical Exam  Constitutional: He is oriented to person, place, and time. He appears well-developed and well-nourished.  HENT:  Head: Normocephalic.  Neck: Normal range of motion.  Respiratory: Effort normal.  Musculoskeletal: Normal range of motion.  Neurological: He is alert and oriented to person, place, and time.  Psychiatric: His speech is normal and behavior is normal. Judgment and thought content normal. His mood appears anxious. Cognition and memory are normal.    Review of Systems  Psychiatric/Behavioral: Positive for substance abuse. The patient is nervous/anxious.   All other systems reviewed and are negative.   Blood pressure 102/69, pulse 80, temperature 97.7 F (36.5 C), temperature source Oral, resp. rate 16, SpO2 100 %.There is no height or weight on file to calculate BMI.  General Appearance: Disheveled  Eye Contact:  Good  Speech:  Normal Rate  Volume:  Normal  Mood:  Anxious  Affect:  Congruent  Thought Process:  Coherent and Descriptions of Associations: Intact  Orientation:  Full (Time,  Place, and Person)  Thought Content:  WDL  Suicidal Thoughts:  No  Homicidal Thoughts:  No  Memory:  Immediate;   Good Recent;   Good Remote;   Good  Judgement:  Fair  Insight:  Fair  Psychomotor Activity:  Normal  Concentration:  Concentration: Good and Attention Span: Good  Recall:  Good  Fund of Knowledge:  Fair  Language:  Good  Akathisia:  No  Handed:  Right  AIMS (if indicated):     Assets:  Leisure Time Physical Health Resilience  ADL's:  Intact  Cognition:  WNL  Sleep:        Treatment Plan Summary: Daily contact with patient to assess and evaluate symptoms and progress in treatment, Medication management and Plan amphetamine  abuse with psychosis: -Crisis stabilization -Medication management:  Started gabapentin 300 mg TID for withdrawal symptoms, Seroquel 200 mg TId for mood stabilization, Trazodone 50 mg at bedtime PRN sleep, and Vistaril 50 mg TID PRN anxiety -Individual and substance abuse counseling -Drug abuse resources  Disposition: No evidence of imminent risk to self or others at present.    Waylan Boga, NP 05/31/2016 9:43 AM  Patient seen face-to-face for psychiatric evaluation, chart reviewed and case discussed with the physician extender and developed treatment plan. Reviewed the information documented and agree with the treatment plan. Corena Pilgrim, MD

## 2016-05-31 NOTE — ED Notes (Signed)
Patient is sleeping at this time.

## 2016-05-31 NOTE — [facility] Suicide Risk Assessment (Signed)
Suicide Risk Assessment  Discharge Assessment   [facility] Discharge Suicide Risk Assessment   Principal Problem: Amphetamine and psychostimulant-induced psychosis with hallucinations [facility]) Discharge Diagnoses:  Patient Active Problem List   Diagnosis Date Noted  . Amphetamine abuse [F15.10] 03/14/2016    Priority: High  . Amphetamine and psychostimulant-induced psychosis with hallucinations [facility]) [F15.951] 03/14/2016    Priority: High  . PTSD (post-traumatic stress disorder) [F43.10] 10/03/2013    Priority: High  . 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 Polysubstance dependence ([facility]) [F19.20] 10/02/2013    Priority: Low  . Polysubstance abuse [F19.10]   . Schizoaffective disorder, bipolar type ([facility]) [F25.0] 10/02/2015  . Involuntary commitment [Z04.6] 10/02/2015  . Cannabis use disorder, moderate, dependence ([facility]) [F12.20] 09/16/2015  . Opioid use disorder, moderate, dependence ([facility]) [F11.20] 09/16/2015  . 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 Benzodiazepine dependence ([facility]) [F13.20] 10/04/2013    Total Time spent with patient: 45 minutes  Musculoskeletal: Strength & Muscle Tone: within normal limits Gait & Station: normal Patient leans: N/A  Psychiatric Specialty Exam: Physical Exam  Constitutional: He is oriented to person, place, and time. He appears well-developed and well-nourished.  HENT:  Head: Normocephalic.  Neck: Normal range of motion.  Respiratory: Effort normal.  Musculoskeletal: Normal range of motion.  Neurological: He is alert and oriented to person, place, and time.  Psychiatric: His speech is normal and behavior is normal. Judgment and thought content normal. His mood appears anxious. Cognition and memory are  normal.    Review of Systems  Psychiatric/Behavioral: Positive for substance abuse. The patient is nervous/anxious.   All other systems reviewed and are negative.   Blood pressure 102/69, pulse 80, temperature 97.7 F (36.5 C), temperature source Oral, resp. rate 16, SpO2 100 %.There is no height or weight on file to calculate BMI.  General Appearance: Disheveled  Eye Contact:  Good  Speech:  Normal Rate  Volume:  Normal  Mood:  Anxious  Affect:  Congruent  Thought Process:  Coherent and Descriptions of Associations: Intact  Orientation:  Full (Time, Place, and Person)  Thought Content:  WDL  Suicidal Thoughts:  No  Homicidal Thoughts:  No  Memory:  Immediate;   Good Recent;   Good Remote;   Good  Judgement:  Fair  Insight:  Fair  Psychomotor Activity:  Normal  Concentration:  Concentration: Good and Attention Span: Good  Recall:  Good  Fund of Knowledge:  Fair  Language:  Good  Akathisia:  No  Handed:  Right  AIMS (if indicated):     Assets:  Leisure Time Physical Health Resilience  ADL's:  Intact  Cognition:  WNL  Sleep:      Mental Status Per Nursing Assessment::   On Admission:   amphethamine abuse  Demographic Factors:  Male and Caucasian  Loss Factors: NA  Historical Factors: NA  Risk Reduction Factors:   Sense of responsibility to family and Living with another person, especially a relative  Continued Clinical Symptoms:  Anxiety, mild  Cognitive Features That Contribute To Risk:  None    Suicide Risk:  Minimal: No identifiable suicidal ideation.  Patients presenting with no risk factors but with morbid ruminations; may be classified as minimal risk based on the severity of the depressive symptoms    Plan Of Care/Follow-up recommendations:  Activity:  as tolerated Diet:  heart healthy diet  LORD, JAMISON, NP 05/31/2016, 9:48 AM

## 2016-05-31 NOTE — ED Notes (Signed)
Pt d/c home per MD order. Pt denies SI/HI/AVH at discharge. This nurse reviewed discharge summary with pt. Pt irritable, uninterested. Pt signed for personal property and property returned. Pt signed e-signature. Ambulatory off unit.

## 2016-05-31 NOTE — ED Notes (Signed)
Daylight savings time

## 2016-05-31 NOTE — Discharge Instructions (Signed)
For your ongoing mental health and substance abuse needs, you are advised to follow up with [facility]. Open Monday - Friday 9AM - 6PM.   [facility] [address] [place], [place] [address] [phone]

## 2016-06-01 ENCOUNTER — Emergency Department (HOSPITAL_COMMUNITY): Payer: Medicare Other

## 2016-06-01 ENCOUNTER — Emergency Department (HOSPITAL_COMMUNITY)
Admission: EM | Admit: 2016-06-01 | Discharge: 2016-06-02 | Disposition: A | Discharge status: Home / Self Care | Payer: Medicare Other | Attending: Emergency Medicine | Admitting: Emergency Medicine

## 2016-06-01 ENCOUNTER — Encounter (HOSPITAL_COMMUNITY): Payer: Self-pay | Admitting: Emergency Medicine

## 2016-06-01 DIAGNOSIS — F29 Unspecified psychosis not due to a substance or known physiological condition: Secondary | ICD-10-CM | POA: Diagnosis not present

## 2016-06-01 DIAGNOSIS — F1910 Other psychoactive substance abuse, uncomplicated: Secondary | ICD-10-CM

## 2016-06-01 DIAGNOSIS — Z79899 Other long term (current) drug therapy: Secondary | ICD-10-CM | POA: Diagnosis not present

## 2016-06-01 DIAGNOSIS — R079 Chest pain, unspecified: Secondary | ICD-10-CM | POA: Diagnosis not present

## 2016-06-01 DIAGNOSIS — F17210 Nicotine dependence, cigarettes, uncomplicated: Secondary | ICD-10-CM | POA: Diagnosis not present

## 2016-06-01 DIAGNOSIS — F4310 Post-traumatic stress disorder, unspecified: Secondary | ICD-10-CM | POA: Diagnosis present

## 2016-06-01 DIAGNOSIS — R0789 Other chest pain: Secondary | ICD-10-CM

## 2016-06-01 DIAGNOSIS — F1210 Cannabis abuse, uncomplicated: Secondary | ICD-10-CM | POA: Insufficient documentation

## 2016-06-01 DIAGNOSIS — F15951 Other stimulant use, unspecified with stimulant-induced psychotic disorder with hallucinations: Secondary | ICD-10-CM | POA: Diagnosis present

## 2016-06-01 DIAGNOSIS — F23 Brief psychotic disorder: Secondary | ICD-10-CM | POA: Diagnosis not present

## 2016-06-01 DIAGNOSIS — F22 Delusional disorders: Secondary | ICD-10-CM | POA: Diagnosis present

## 2016-06-01 LAB — CBC WITH DIFFERENTIAL/PLATELET
BASOS ABS: 0 10*3/uL (ref 0.0–0.1)
Basophils Relative: 0 %
EOS ABS: 0.1 10*3/uL (ref 0.0–0.7)
EOS PCT: 1 %
HCT: 37.2 % — ABNORMAL LOW (ref 39.0–52.0)
Hemoglobin: 12.9 g/dL — ABNORMAL LOW (ref 13.0–17.0)
Lymphocytes Relative: 24 %
Lymphs Abs: 2.2 10*3/uL (ref 0.7–4.0)
MCH: 28.9 pg (ref 26.0–34.0)
MCHC: 34.7 g/dL (ref 30.0–36.0)
MCV: 83.4 fL (ref 78.0–100.0)
Monocytes Absolute: 0.4 10*3/uL (ref 0.1–1.0)
Monocytes Relative: 5 %
Neutro Abs: 6.7 10*3/uL (ref 1.7–7.7)
Neutrophils Relative %: 70 %
PLATELETS: 213 10*3/uL (ref 150–400)
RBC: 4.46 MIL/uL (ref 4.22–5.81)
RDW: 14.4 % (ref 11.5–15.5)
WBC: 9.5 10*3/uL (ref 4.0–10.5)

## 2016-06-01 LAB — COMPREHENSIVE METABOLIC PANEL
ALK PHOS: 48 U/L (ref 38–126)
ALT: 29 U/L (ref 17–63)
AST: 31 U/L (ref 15–41)
Albumin: 4 g/dL (ref 3.5–5.0)
Anion gap: 4 — ABNORMAL LOW (ref 5–15)
BUN: 10 mg/dL (ref 6–20)
CALCIUM: 9.1 mg/dL (ref 8.9–10.3)
CO2: 27 mmol/L (ref 22–32)
CREATININE: 0.8 mg/dL (ref 0.61–1.24)
Chloride: 107 mmol/L (ref 101–111)
Glucose, Bld: 112 mg/dL — ABNORMAL HIGH (ref 65–99)
Potassium: 4.3 mmol/L (ref 3.5–5.1)
Sodium: 138 mmol/L (ref 135–145)
Total Bilirubin: 0.8 mg/dL (ref 0.3–1.2)
Total Protein: 7.1 g/dL (ref 6.5–8.1)

## 2016-06-01 LAB — I-STAT TROPONIN, ED
Troponin i, poc: 0 ng/mL (ref 0.00–0.08)
Troponin i, poc: 0 ng/mL (ref 0.00–0.08)

## 2016-06-01 LAB — RAPID URINE DRUG SCREEN, HOSP PERFORMED
AMPHETAMINES: POSITIVE — AB
BENZODIAZEPINES: NOT DETECTED
Barbiturates: NOT DETECTED
Cocaine: NOT DETECTED
OPIATES: NOT DETECTED
Tetrahydrocannabinol: POSITIVE — AB

## 2016-06-01 LAB — CK: Total CK: 119 U/L (ref 49–397)

## 2016-06-01 LAB — ETHANOL

## 2016-06-01 MED ORDER — ZIPRASIDONE MESYLATE 20 MG IM SOLR
10.0000 mg | Freq: Once | INTRAMUSCULAR | Status: AC
  Administered 2016-06-01: 10 mg via INTRAMUSCULAR
  Filled 2016-06-01: qty 20

## 2016-06-01 MED ORDER — LORAZEPAM 1 MG PO TABS
1.0000 mg | ORAL_TABLET | Freq: Once | ORAL | Status: DC
  Filled 2016-06-01: qty 1

## 2016-06-01 MED ORDER — LORAZEPAM 1 MG PO TABS
1.0000 mg | ORAL_TABLET | Freq: Once | ORAL | Status: AC
  Administered 2016-06-01: 1 mg via ORAL
  Filled 2016-06-01: qty 1

## 2016-06-01 NOTE — ED Notes (Signed)
Bed: [id number] Expected date:  Expected time:  Means of arrival:  Comments: 36 yo m delusional

## 2016-06-01 NOTE — ED Triage Notes (Signed)
Patient picked up at home by EMS with delusional paranoid thoughts.  Denies suicidal or homicidal ideation.  Patient also c/o difficulty breathing.

## 2016-06-01 NOTE — ED Notes (Signed)
Family visiting hours ( pts family to come back tomorrow)

## 2016-06-01 NOTE — ED Provider Notes (Signed)
[facility] DEPT Provider Note   CSN: [id number] Arrival date & time: 06/01/16  1730     History   Chief Complaint Chief Complaint  Patient presents with  . Paranoid    HPI Joshua Davila is a 36 y.o. male.  HPI Patient presents with multiple complaints. States he is having chest pain and shortness of breath. Also complains of diffuse body aches. Also believes that he is being poisoned. Speaking in pressured speech and difficult to follow. Denies any suicidal ideation. He is intermittently crying. Past Medical History:  Diagnosis Date  . Depression   . Gender identity disorder   . Liver disorder   . PTSD (post-traumatic stress disorder)   . Schizoaffective disorder [facility])     Patient Active Problem List   Diagnosis Date Noted  . 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 Amphetamine and psychostimulant-induced psychosis with hallucinations (HCC) 03/14/2016  . Polysubstance abuse   . Schizoaffective disorder, bipolar type (HCC) 10/02/2015  . Involuntary commitment 10/02/2015  . Cannabis use disorder, moderate, dependence (HCC) 09/16/2015  . Opioid use disorder, moderate, dependence (HCC) 09/16/2015  . Substance or medication-induced bipolar and related disorder with onset during intoxication (HCC) 09/16/2015  . 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 Benzodiazepine dependence (HCC) 10/04/2013  . PTSD (post-traumatic stress disorder) 10/03/2013  . Polysubstance dependence (HCC) 10/02/2013    History reviewed. No pertinent surgical history.     Home Medications    Prior to Admission medications   Medication Sig Start Date End Date Taking? Authorizing Provider  acetaminophen (TYLENOL) 325 MG tablet Take 2 tablets (650 mg total) by mouth every 4 (four) hours as needed for mild pain or fever (T > 38.3Celsius). Patient not taking: Reported on 05/30/2016  11/11/15   Sanjuana Kava, NP  alum & mag hydroxide-simeth (MAALOX/MYLANTA) 200-200-20 MG/5ML suspension Take 30 mLs by mouth as needed for indigestion or heartburn. Patient not taking: Reported on 05/30/2016 11/11/15   Sanjuana Kava, NP  atomoxetine (STRATTERA) 40 MG capsule Take 1 capsule (40 mg total) by mouth daily. For ADHD/depression Patient not taking: Reported on 05/30/2016 11/11/15   Sanjuana Kava, NP  atorvastatin (LIPITOR) 20 MG tablet Take 1 tablet (20 mg total) by mouth daily. For high Cholesterol Patient not taking: Reported on 05/30/2016 11/11/15   Sanjuana Kava, NP  benztropine (COGENTIN) 0.5 MG tablet Take 1 tablet (0.5 mg total) by mouth 2 (two) times daily. For prevention of drug induced tremors Patient not taking: Reported on 05/30/2016 11/11/15   Sanjuana Kava, NP  divalproex (DEPAKOTE) 250 MG DR tablet Take 1 tablet (250 mg total) by mouth daily after breakfast. For mood stabilization Patient not taking: Reported on 05/30/2016 11/11/15   Sanjuana Kava, NP  divalproex (DEPAKOTE) 500 MG DR tablet Take 2 tablets (1,000 mg total) by mouth daily at 8 pm. For mood stabilization Patient not taking: Reported on 05/30/2016 11/11/15   Sanjuana Kava, NP  emtricitabine-tenofovir (TRUVADA) 200-300 MG tablet Take 1 tablet by mouth daily. For pre-exposure to HIV Patient not taking: Reported on 05/30/2016 11/11/15   Sanjuana Kava, NP  ibuprofen (ADVIL,MOTRIN) 600 MG tablet Take 1 tablet (600 mg total) by mouth every 8 (eight) hours as needed (Temp > 38.3Celsius). For pain Patient not taking: Reported on 05/30/2016 11/11/15   Sanjuana Kava, NP  lidocaine (LIDODERM) 5 % Place 1 patch onto the skin daily. Remove &  Discard patch within 12 hours or as directed by MD: For pain management Patient not taking: Reported on 05/30/2016 11/11/15   Sanjuana Kava, NP  LORazepam (ATIVAN) 1 MG tablet Take 1 tablet (1 mg total) by mouth every 8 (eight) hours as needed for anxiety (agitation). Patient not taking: Reported on  05/30/2016 11/11/15   Sanjuana Kava, NP  methocarbamol (ROBAXIN) 500 MG tablet Take 1 tablet (500 mg total) by mouth every 8 (eight) hours as needed for muscle spasms. Patient not taking: Reported on 05/30/2016 11/11/15   Sanjuana Kava, NP  nicotine polacrilex (NICORETTE) 2 MG gum Take 1 each (2 mg total) by mouth as needed for smoking cessation. Patient not taking: Reported on 05/30/2016 11/11/15   Sanjuana Kava, NP  ondansetron (ZOFRAN) 4 MG tablet Take 1 tablet (4 mg total) by mouth every 8 (eight) hours as needed for nausea or vomiting. Patient not taking: Reported on 05/30/2016 11/11/15   Sanjuana Kava, NP  PARoxetine (PAXIL) 20 MG tablet Take 1 tablet (20 mg total) by mouth daily. For depression Patient not taking: Reported on 05/30/2016 09/19/15   Sanjuana Kava, NP  traZODone (DESYREL) 50 MG tablet Take 1 tablet (50 mg total) by mouth at bedtime as needed for sleep. Patient not taking: Reported on 05/30/2016 11/11/15   Sanjuana Kava, NP  ziprasidone (GEODON) 20 MG capsule Take 1 capsule (20 mg total) by mouth daily with supper. For mood control Patient not taking: Reported on 05/30/2016 11/11/15   Sanjuana Kava, NP    Family History Family History  Problem Relation Age of Onset  . Multiple sclerosis Mother   . Mental illness Neg Hx     Social History Social History  Substance Use Topics  . Smoking status: Current Every Day Smoker    Packs/day: 1.00    Types: Cigarettes  . Smokeless tobacco: Never Used  . Alcohol use Yes     Comment: occasional     Allergies   Penicillins and Sulfa antibiotics   Review of Systems Review of Systems  Respiratory: Positive for shortness of breath.   Cardiovascular: Positive for chest pain.  Gastrointestinal: Negative for vomiting.  Musculoskeletal: Positive for myalgias.  Skin: Negative for rash and wound.  Neurological: Negative for weakness, numbness and headaches.  Psychiatric/Behavioral: Positive for agitation and behavioral problems. The  patient is nervous/anxious and is hyperactive.   All other systems reviewed and are negative.    Physical Exam Updated Vital Signs BP 108/66 (BP Location: Left Arm)   Pulse 60   Temp 98.1 F (36.7 C) (Oral)   Resp 16   SpO2 98%   Physical Exam  Constitutional: He is oriented to person, place, and time. He appears well-developed and well-nourished.  HENT:  Head: Normocephalic and atraumatic.  Mouth/Throat: Oropharynx is clear and moist.  No obvious head trauma.  Eyes: EOM are normal. Pupils are equal, round, and reactive to light.  Neck: Normal range of motion. Neck supple.  No meningismus.  Cardiovascular: Normal rate and regular rhythm.  Exam reveals no gallop and no friction rub.   No murmur heard. Pulmonary/Chest: Effort normal and breath sounds normal. No respiratory distress. He has no wheezes. He has no rales. He exhibits no tenderness.  Abdominal: Soft. Bowel sounds are normal. There is no tenderness. There is no rebound and no guarding.  Musculoskeletal: Normal range of motion. He exhibits no edema or tenderness.  No lower extremity swelling, asymmetry or tenderness.  Neurological: He  is alert and oriented to person, place, and time.  Skin: Skin is warm and dry. Capillary refill takes less than 2 seconds. No rash noted. No erythema.  Psychiatric:  Pressured speech and hyperactive. Paranoid and agitated.   Nursing note and vitals reviewed.    ED Treatments / Results  Labs (all labs ordered are listed, but only abnormal results are displayed) Labs Reviewed  COMPREHENSIVE METABOLIC PANEL - Abnormal; Notable for the following:       Result Value   Glucose, Bld 112 (*)    Anion gap 4 (*)    All other components within normal limits  CBC WITH DIFFERENTIAL/PLATELET - Abnormal; Notable for the following:    Hemoglobin 12.9 (*)    HCT 37.2 (*)    All other components within normal limits  RAPID URINE DRUG SCREEN, HOSP PERFORMED - Abnormal; Notable for the following:      Amphetamines POSITIVE (*)    Tetrahydrocannabinol POSITIVE (*)    All other components within normal limits  ETHANOL  CK  I-STAT TROPOININ, ED  I-STAT TROPOININ, ED    EKG  EKG Interpretation  Date/Time:  Monday June 01 2016 20:18:23 EDT Ventricular Rate:  62 PR Interval:    QRS Duration: 113 QT Interval:  423 QTC Calculation: 430 R Axis:   -8 Text Interpretation:  Age not entered, assumed to be  36 years old for purpose of ECG interpretation Sinus rhythm Confirmed by Ranae PalmsYELVERTON  MD, Hadas Jessop ([id number]) on 06/01/2016 9:17:19 PM       Radiology Dg Chest 2 View  Result Date: 06/01/2016 CLINICAL DATA:  36 y/o  M; chest pain. EXAM: CHEST  2 VIEW COMPARISON:  04/12/2016 chest radiograph. FINDINGS: Stable heart size and mediastinal contours are within normal limits. Both lungs are clear. Stable mild S-shaped curvature of the thoracic spine. IMPRESSION: No active cardiopulmonary disease. Electronically Signed   By: Mitzi HansenLance  Furusawa-Stratton M.D.   On: 06/01/2016 20:45    Procedures Procedures (including critical care time)  Medications Ordered in ED Medications  LORazepam (ATIVAN) tablet 1 mg (not administered)  LORazepam (ATIVAN) tablet 1 mg (1 mg Oral Given 06/01/16 2026)  ziprasidone (GEODON) injection 10 mg (10 mg Intramuscular Given 06/01/16 2320)     Initial Impression / Assessment and Plan / ED Course  I have reviewed the triage vital signs and the nursing notes.  Pertinent labs & imaging results that were available during my care of the patient were reviewed by me and considered in my medical decision making (see chart for details).     Positive UDS for amphetamines and THC. Given Ativan for agitation. EKG with diffuse ST segment elevation but no reciprocal changes. Likely repoll pattern. Initial troponin is normal. Normal white blood cell count. Repeat troponin is normal. Patient is now resting comfortably. He is cleared for psychiatric evaluation. Final Clinical  Impressions(s) / ED Diagnoses   Final diagnoses:  Psychosis, unspecified psychosis type  Substance abuse  Atypical chest pain    New Prescriptions New Prescriptions   No medications on file     Loren Raceravid Lancelot Alyea, MD 06/02/16 [phone]

## 2016-06-01 NOTE — ED Notes (Signed)
In xray

## 2016-06-02 MED ORDER — HYDROXYZINE HCL 25 MG PO TABS: 50.0000 mg | ORAL_TABLET | Freq: Four times a day (QID) | ORAL | Status: DC | PRN

## 2016-06-02 MED ORDER — LORAZEPAM 1 MG PO TABS: 1.0000 mg | ORAL_TABLET | Freq: Three times a day (TID) | ORAL | Status: DC | PRN

## 2016-06-02 MED ORDER — CARBAMAZEPINE ER 200 MG PO TB12: 200.0000 mg | ORAL_TABLET | Freq: Two times a day (BID) | ORAL | Status: DC

## 2016-06-02 MED ORDER — GABAPENTIN 300 MG PO CAPS
300.0000 mg | ORAL_CAPSULE | Freq: Three times a day (TID) | ORAL | Status: DC
  Administered 2016-06-02 (×2): 300 mg via ORAL
  Filled 2016-06-02 (×2): qty 1

## 2016-06-02 MED ORDER — TRAZODONE HCL 100 MG PO TABS: 100.0000 mg | ORAL_TABLET | Freq: Every evening | ORAL | Status: DC | PRN

## 2016-06-02 MED ORDER — HALOPERIDOL 2 MG PO TABS
2.0000 mg | ORAL_TABLET | Freq: Two times a day (BID) | ORAL | Status: DC
  Administered 2016-06-02: 2 mg via ORAL
  Filled 2016-06-02: qty 1

## 2016-06-02 NOTE — Discharge Instructions (Signed)
For your ongoing behavioral health needs, you are advised to follow up with one of the following providers.  All are able to provide both mental health and substance abuse treatment.  Contact them at your earliest opportunity to ask about scheduling an intake appointment: ° °     [facility] °     [address] °     [place], [place] [address] °     [phone] °     New patients are seen at the walk-in clinic every Tuesday from 9:00 am - 12:00 pm. ° °     [facility] °     [address] °     [place], [place] [address] °     [phone] °     New patients are seen at their walk-in clinic.  Walk-in hours are Monday - Friday from 8:00 am - 12:00 pm, and from 1:00 pm - 3:00 pm.  Walk-in patients are seen on a first come, first served basis, so try to arrive as early as possible for the best chance of being seen the same day.  There is an initial fee of $22.50. ° °     [facility] °     [address] °     [place], [place] [address] °     [phone] °

## 2016-06-02 NOTE — ED Notes (Addendum)
Introduced self to patient. Pt oriented to unit expectations.  Assessed pt for:  A) Anxiety &/or agitation: Pt communication minimal. He wants to stay in bed and sleep. Took his medication and went back to sleep. He appears to be irritable, but is not acting out.   S) Safety: Safety maintained with q-15-minute checks and hourly rounds by staff.  A) ADLs: Pt able to perform ADLs independently.  P) Pick-Up (room cleanliness): Pt's room clean and free of clutter.

## 2016-06-02 NOTE — ED Notes (Signed)
[facility] Expected date:  Expected time:  Means of arrival:  Comments: Notz

## 2016-06-02 NOTE — ED Notes (Signed)
Pt presents with complaint of difficulty breathing he reports. Answers to HartleyJamie.  Denies SI, HI or AVH.  Feeling hopeless.  Denies alcohol use, positive drug use noted.  A&O x 3, no distress noted, cooperative but irritable.  Monitoring for safety, Q 15 min checks in effect.  Safety check for contraband completed, no items found.

## 2016-06-02 NOTE — ED Notes (Addendum)
Pt demanding to go home. Joshua MeansJamison Lord, NP, stated to let him be discharged. Pt denies SI/HI. He said, "I came here for a heart attack and I don't need to go to a psych hospital."
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
## 2016-06-02 NOTE — BH Assessment (Signed)
[facility] Assessment Progress Note  Per Thedore MinsMojeed Akintayo, MD, this pt would benefit from psychiatric hospitalization at this time.  This writer sought placement for the pt, who is under voluntary status.  Late in the afternoon [facility]  This was staffed with Nanine MeansJamison Lord, [name], who determined that pt is safe for discharge.  Discharge instructions advise [facility] to [facility], [facility], or [facility].  Pt's nurse, Diane, has been notified.  Doylene Canninghomas Ambika Zettlemoyer, MA Triage Specialist [phone]

## 2016-06-02 NOTE — ED Notes (Signed)
When this writer was getting pt's vital signs this afternoon, pt insisted that he go home. He said, "I came here because I thought I was having a heart attack." When told that he was medically cleared, and that we are trying to find placement in a psychiatric hospital he said, "I would rather go home. I don't want to go to a psychiatric hospital."  This writer discussed this with Janice Coffinom Hughes, who is working to find him placement, and Nanine MeansJamison Lord, NP. Lord, NP, said ok to discharge pt home with resources. Janice Coffinom Hughes provided resources which pt accepted. Pt was irritable about being here still, and would not sign out.  Pt denied SI/HI, AVH. He did not appear to be responding to internal stimuli. Security walked pt to the bathroom where he changed clothes and then to the ER waiting room where he waited for his ride.

## 2016-06-02 NOTE — Progress Notes (Signed)
06/02/16 1339:  LRT went to pt room to offer activities, pt was sleep.  Caroll RancherMarjette Samarrah Tranchina, LRT/CTRS

## 2016-06-03 NOTE — Progress Notes (Signed)
Patient demanded to leave and stated he came to the ED about his heart, did not need a psychiatric admission.  No suicidal/homicidal ideations or hallucinations.  Patient is stable for discharge.  The patient had been here twice in the past week and inpatient was being sought but he refused.  Nanine MeansJamison Lord, PMH-NP

## 2016-06-16 ENCOUNTER — Encounter (HOSPITAL_COMMUNITY): Payer: Self-pay | Admitting: Emergency Medicine

## 2016-06-16 ENCOUNTER — Emergency Department (HOSPITAL_COMMUNITY)
Admission: EM | Admit: 2016-06-16 | Discharge: 2016-06-17 | Disposition: A | Discharge status: Home / Self Care | Payer: Medicare Other | Attending: Emergency Medicine | Admitting: Emergency Medicine

## 2016-06-16 DIAGNOSIS — F209 Schizophrenia, unspecified: Secondary | ICD-10-CM | POA: Diagnosis not present

## 2016-06-16 DIAGNOSIS — Z79899 Other long term (current) drug therapy: Secondary | ICD-10-CM | POA: Insufficient documentation

## 2016-06-16 DIAGNOSIS — F989 Unspecified behavioral and emotional disorders with onset usually occurring in childhood and adolescence: Secondary | ICD-10-CM | POA: Diagnosis not present

## 2016-06-16 DIAGNOSIS — R45850 Homicidal ideations: Secondary | ICD-10-CM | POA: Diagnosis not present

## 2016-06-16 DIAGNOSIS — F4310 Post-traumatic stress disorder, unspecified: Secondary | ICD-10-CM | POA: Diagnosis present

## 2016-06-16 DIAGNOSIS — F1510 Other stimulant abuse, uncomplicated: Secondary | ICD-10-CM | POA: Diagnosis present

## 2016-06-16 DIAGNOSIS — F15951 Other stimulant use, unspecified with stimulant-induced psychotic disorder with hallucinations: Secondary | ICD-10-CM | POA: Diagnosis present

## 2016-06-16 DIAGNOSIS — F919 Conduct disorder, unspecified: Secondary | ICD-10-CM

## 2016-06-16 DIAGNOSIS — F17210 Nicotine dependence, cigarettes, uncomplicated: Secondary | ICD-10-CM | POA: Diagnosis not present

## 2016-06-16 DIAGNOSIS — F22 Delusional disorders: Secondary | ICD-10-CM | POA: Diagnosis present

## 2016-06-16 DIAGNOSIS — F1520 Other stimulant dependence, uncomplicated: Secondary | ICD-10-CM | POA: Diagnosis present

## 2016-06-16 LAB — CBC
HCT: 35.6 % — ABNORMAL LOW (ref 39.0–52.0)
Hemoglobin: 12.5 g/dL — ABNORMAL LOW (ref 13.0–17.0)
MCH: 28.9 pg (ref 26.0–34.0)
MCHC: 35.1 g/dL (ref 30.0–36.0)
MCV: 82.2 fL (ref 78.0–100.0)
PLATELETS: 234 10*3/uL (ref 150–400)
RBC: 4.33 MIL/uL (ref 4.22–5.81)
RDW: 14 % (ref 11.5–15.5)
WBC: 11.2 10*3/uL — AB (ref 4.0–10.5)

## 2016-06-16 LAB — COMPREHENSIVE METABOLIC PANEL
ALT: 30 U/L (ref 17–63)
ANION GAP: 9 (ref 5–15)
AST: 32 U/L (ref 15–41)
Albumin: 4.2 g/dL (ref 3.5–5.0)
Alkaline Phosphatase: 64 U/L (ref 38–126)
BUN: 16 mg/dL (ref 6–20)
CHLORIDE: 106 mmol/L (ref 101–111)
CO2: 23 mmol/L (ref 22–32)
Calcium: 9.4 mg/dL (ref 8.9–10.3)
Creatinine, Ser: 0.98 mg/dL (ref 0.61–1.24)
GFR calc Af Amer: >60 mL/min (ref >60)
Glucose, Bld: 96 mg/dL (ref 65–99)
POTASSIUM: 3.8 mmol/L (ref 3.5–5.1)
Sodium: 138 mmol/L (ref 135–145)
Total Bilirubin: 0.7 mg/dL (ref 0.3–1.2)
Total Protein: 7.8 g/dL (ref 6.5–8.1)

## 2016-06-16 LAB — RAPID URINE DRUG SCREEN, HOSP PERFORMED
AMPHETAMINES: POSITIVE — AB
BENZODIAZEPINES: POSITIVE — AB
Barbiturates: NOT DETECTED
Cocaine: NOT DETECTED
OPIATES: NOT DETECTED
Tetrahydrocannabinol: POSITIVE — AB

## 2016-06-16 LAB — SALICYLATE LEVEL

## 2016-06-16 LAB — ACETAMINOPHEN LEVEL: Acetaminophen (Tylenol), Serum: <10 ug/mL — ABNORMAL LOW (ref 10–30)

## 2016-06-16 LAB — ETHANOL

## 2016-06-16 MED ORDER — ONDANSETRON HCL 4 MG PO TABS: 4.0000 mg | ORAL_TABLET | Freq: Three times a day (TID) | ORAL | Status: DC | PRN

## 2016-06-16 MED ORDER — IBUPROFEN 200 MG PO TABS: 600.0000 mg | ORAL_TABLET | Freq: Three times a day (TID) | ORAL | Status: DC | PRN

## 2016-06-16 MED ORDER — ALUM & MAG HYDROXIDE-SIMETH 200-200-20 MG/5ML PO SUSP: 30.0000 mL | ORAL | Status: DC | PRN

## 2016-06-16 MED ORDER — ACETAMINOPHEN 325 MG PO TABS: 650.0000 mg | ORAL_TABLET | ORAL | Status: DC | PRN

## 2016-06-16 MED ORDER — LORAZEPAM 1 MG PO TABS: 1.0000 mg | ORAL_TABLET | Freq: Three times a day (TID) | ORAL | Status: DC | PRN

## 2016-06-16 MED ORDER — GABAPENTIN 300 MG PO CAPS
300.0000 mg | ORAL_CAPSULE | Freq: Three times a day (TID) | ORAL | Status: DC
  Administered 2016-06-16 – 2016-06-17 (×3): 300 mg via ORAL
  Filled 2016-06-16 (×3): qty 1

## 2016-06-16 NOTE — ED Notes (Signed)
Patient arrived to room 35 and promptly went to sleep.  15 minute checks and video monitoring in place.
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
## 2016-06-16 NOTE — BH Assessment (Addendum)
Assessment Note  Joshua Davila is an 36 y.o. male who presents involuntarily and unaccompanied to [facility]. Patient presents with multiple complaints. States he is having chest pain, shortness of breath, and skin burning. Also complains of diffuse body aches. Also believes that he is being poisoned. Speaking in pressured speech and difficult to follow. Denies any suicidal ideation. He is intermittently crying. Pt was a poor historian during the assessment. Pt denies SI, and HI. Pt reported, "I been meaning to take a shower," when asked if he has self harm. Patient asked about AVH's. He appears to have delusional and paranoid thoughts.   Pt presents sleeping in scrubs with logical/coherent speech. Pt's eye contact is poor. Pt's mood is tired. Pt's affect is congruent with mood. Pt's thought process circumstantial. Pt's judgement is impaired. Pt's concentration, insight and impulse control are poor.  Patient was discharged from [facility] on 03/13.  His drug screen shows positive for benzodiazepines, amphetamines, and THC. Patient would not provide any details of his substance use.  Patient has had three inpatient episodes at [facility] in 2017.  Outpatient care is unknown.  Diagnosis: Major Depressive Disorder, Recurrent, Severe, without psychotic features; Schizoaffective Disorder; PTSD; Substance Abuse; Methamphetamine abuse  Past Medical History:  Past Medical History:  Diagnosis Date  . Depression   . Gender identity disorder   . Liver disorder   . PTSD (post-traumatic stress disorder)   . Schizoaffective disorder (HCC)     History reviewed. No pertinent surgical history.  Family History:  Family History  Problem Relation Age of Onset  . Multiple sclerosis Mother   . Mental illness Neg Hx     Social History:  reports that he has been smoking Cigarettes.  He has been smoking about 1.00 pack per day. He has never used smokeless tobacco. He reports that he drinks alcohol. He reports that he uses drugs,  including Cocaine, Methamphetamines, and Marijuana.  Additional Social History:  Alcohol / Drug Use Pain Medications: See PTA medication list Prescriptions: See PTA medication list History of alcohol / drug use?: Yes Longest period of sobriety (when/how long): 50 days Negative Consequences of Use: Financial, Legal, Personal relationships Withdrawal Symptoms: Seizures, Irritability, Sweats, Tremors Onset of Seizures: Four years ago Date of most recent seizure: Four years ago Substance #1 Name of Substance 1: Amphetamines 1 - Age of First Use: Unknown; refused to answer 1 - Amount (size/oz): Unknown; refused to answer 1 - Frequency: Unknown; refused to answer 1 - Duration: Unknown; refused to answer 1 - Last Use / Amount: "I haven't had any in 48 hrs" Substance #2 Name of Substance 2: Marijuana 2 - Age of First Use: Teens 2 - Amount (size/oz): Unknown; refused to answer 2 - Frequency: Unknown; refused to answer 2 - Duration: Unknown; refused to answer 2 - Last Use / Amount: Unknown; refused to answer Substance #3 Name of Substance 3: Benzos (valium) 3 - Age of First Use: Unknown; refused to answer 3 - Amount (size/oz): Unknown; refused to answer 3 - Frequency: Unknown; refused to answer 3 - Duration: Unknown; refused to answer 3 - Last Use / Amount: Unknown; refused to answer Substance #4 Name of Substance 4: Various narcotic pain medications (oxycodone, oxycontin, etc) 4 - Age of First Use: 21 4 - Amount (size/oz): Unknown; refused to answer 4 - Frequency: Unknown; refused to answer 4 - Duration: Unknown; refused to answer 4 - Last Use / Amount: Unknown; refused to answer  CIWA: CIWA-Ar BP: 120/74 Pulse Rate: 87 COWS:  Allergies:  Allergies  Allergen Reactions  . Penicillins Swelling, Rash and Other (See Comments)    Reaction:  Facial swelling Has patient had a PCN reaction causing immediate rash, facial/tongue/throat swelling, SOB or lightheadedness with  hypotension: Yes Has patient had a PCN reaction causing severe rash involving mucus membranes or skin necrosis: No Has patient had a PCN reaction that required hospitalization No Has patient had a PCN reaction occurring within the last 10 years: No If all of the above answers are "NO", then may proceed with Cephalosporin use.  . Sulfa Antibiotics Rash    Home Medications:  (Not in a hospital admission)  OB/GYN Status:  No LMP for male patient.  General Assessment Data Location of Assessment: [facility] ED TTS Assessment: In system Is this a Tele or Face-to-Face Assessment?: Face-to-Face Is this an Initial Assessment or a Re-assessment for this encounter?: Initial Assessment Marital status: Married [place]  (n/a) Is patient pregnant?: No Pregnancy Status: No Living Arrangements: Spouse/significant other (Other housemates also.) Can pt return to current living arrangement?: Yes Admission Status: Voluntary Is patient capable of signing voluntary admission?: Yes Referral Source: Self/Family/Friend Insurance type:  [facility])     Crisis Care Plan Living Arrangements: Spouse/significant other (Other housemates also.) Legal Guardian: Other: (no legal guardian ) Name of Psychiatrist: Unknown Name of Therapist: Unknown  Education Status Is patient currently in school?: No Current Grade:  ([employer]) Highest grade of school patient has completed: Unknown Name of school: [employer] Contact person: NA  Risk to self with the past 6 months Suicidal Ideation: No Has patient been a risk to self within the past 6 months prior to admission? : No Suicidal Intent: No Has patient had any suicidal intent within the past 6 months prior to admission? : No Is patient at risk for suicide?: No Suicidal Plan?: No Has patient had any suicidal plan within the past 6 months prior to admission? : No Access to Means: No What has been your use of drugs/alcohol within the last 12 months?:  (UDS positive for  THC,Amphetamines,Benzodiazepines) Previous Attempts/Gestures: No How many times?:  (denies prior suicide attempts or gestures) Other Self Harm Risks:  (picking on skin) Triggers for Past Attempts: Unpredictable, Spouse contact Intentional Self Injurious Behavior: Damaging Comment - Self Injurious Behavior:  (picking skin until it bleeds) Family Suicide History: Unknown Recent stressful life event(s): Other (Comment) ("My spouse.Marland Kitchen...") Persecutory voices/beliefs?: No Depression: Yes Depression Symptoms: Despondent, Insomnia, Loss of interest in usual pleasures, Feeling worthless/self pity, Feeling angry/irritable Substance abuse history and/or treatment for substance abuse?: No Suicide prevention information given to non-admitted patients: Not applicable  Risk to Others within the past 6 months Homicidal Ideation: No Does patient have any lifetime risk of violence toward others beyond the six months prior to admission? : Yes (comment) (Past history of physical aggression.) Thoughts of Harm to Others:  (unknown ) Comment - Thoughts of Harm to Others:  (none reported) Current Homicidal Intent: No Current Homicidal Plan: No Access to Homicidal Means: No Describe Access to Homicidal Means:  (n/a) Identified Victim:  (n/a) History of harm to others?: No Assessment of Violence: None Noted Violent Behavior Description:  (argumentative with questioning; uncooperative w/ questions) Does patient have access to weapons?: No Criminal Charges Pending?: No Does patient have a court date: No Is patient on probation?: No  Psychosis Hallucinations:  (denies ) Delusions: Unspecified (denies )  Mental Status Report Appearance/Hygiene: Disheveled, In scrubs Eye Contact: Poor Motor Activity: Freedom of movement Speech: Incoherent, Aggressive, Abusive Level  of Consciousness: Irritable, Sedated (Earlier was irritable, after geodon he was sedated (literall) Mood: Threatening, Anxious,  Despair Affect: Angry, Anxious, Apprehensive Anxiety Level: Severe Thought Processes: Circumstantial, Flight of Ideas, Irrelevant Judgement: Impaired Orientation: Person, Time, Situation Obsessive Compulsive Thoughts/Behaviors: Unable to Assess  Cognitive Functioning Concentration: Poor Memory: Recent Intact, Remote Intact IQ: Average Insight: Poor Impulse Control: Poor Appetite:  (refused to answer) Weight Loss:  (0) Weight Gain:  (0) Sleep: Unable to Assess Total Hours of Sleep:  (unknown ) Vegetative Symptoms: None  ADLScreening [facility] Assessment Services) Patient's cognitive ability adequate to safely complete daily activities?: Yes Patient able to express need for assistance with ADLs?: Yes Independently performs ADLs?: Yes (appropriate for developmental age)  Prior Inpatient Therapy Prior Inpatient Therapy: Yes Prior Therapy Dates: 10/2015, 08/2015, 05/2015 Prior Therapy Facilty/Provider(s): [facility] Reason for Treatment: psychosis, drug use  Prior Outpatient Therapy Prior Outpatient Therapy: Yes Prior Therapy Dates: Unknown Prior Therapy Facilty/Provider(s): Unknown Reason for Treatment: Unknown Does patient have an ACCT team?: No Does patient have Intensive In-House Services?  : No Does patient have [facility] services? : No Does patient have P4CC services?: No  ADL Screening (condition at time of admission) Patient's cognitive ability adequate to safely complete daily activities?: Yes Is the patient deaf or have difficulty hearing?: No Does the patient have difficulty seeing, even when wearing glasses/contacts?: No Does the patient have difficulty concentrating, remembering, or making decisions?: Yes Patient able to express need for assistance with ADLs?: Yes Does the patient have difficulty dressing or bathing?: No Independently performs ADLs?: Yes (appropriate for developmental age) Does the patient have difficulty walking or climbing stairs?: No Weakness of Legs:  None Weakness of Arms/Hands: None  Home Assistive Devices/Equipment Home Assistive Devices/Equipment: None          Advance Directives (For Healthcare) Does Patient Have a Medical Advance Directive?: No Would patient like information on creating a medical advance directive?: No - Patient declined Nutrition Screen- [facility] Patient's home diet: Regular  Additional Information 1:1 In Past 12 Months?: No CIRT Risk: No Elopement Risk: No Does patient have medical clearance?: Yes     Disposition:Per Nanine Means, [name], patient to remain in the ED overnight. Pending am psych evaluation.  On Site Evaluation by:   Reviewed with Physician:    Melynda Ripple 06/16/2016 12:44 PM

## 2016-06-16 NOTE — ED Notes (Signed)
Bed: WA33 Expected date:  Expected time:  Means of arrival:  Comments: 
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
## 2016-06-16 NOTE — ED Notes (Signed)
Pt resting at present, no distress noted, calm & cooperative.  Monitoring for safety, Q 15 min checks in effect. 

## 2016-06-16 NOTE — Progress Notes (Signed)
06/16/16 1348:  LRT introduced self and offered activities, pt declined.  [name], LRT/CTRS

## 2016-06-17 DIAGNOSIS — F15951 Other stimulant use, unspecified with stimulant-induced psychotic disorder with hallucinations: Secondary | ICD-10-CM | POA: Diagnosis not present

## 2016-06-17 DIAGNOSIS — Z79899 Other long term (current) drug therapy: Secondary | ICD-10-CM

## 2016-06-17 DIAGNOSIS — F17210 Nicotine dependence, cigarettes, uncomplicated: Secondary | ICD-10-CM | POA: Diagnosis not present

## 2016-06-17 MED ORDER — GABAPENTIN 300 MG PO CAPS: 300.0000 mg | ORAL_CAPSULE | Freq: Three times a day (TID) | ORAL | 0 refills | Status: DC

## 2016-06-17 NOTE — [facility] Suicide Risk Assessment (Signed)
Suicide Risk Assessment  Discharge Assessment   [facility]   Principal Problem: Amphetamine and psychostimulant-induced psychosis with hallucinations [facility](HCC) Discharge Diagnoses:  Patient Active Problem List   Diagnosis Date Noted  . Amphetamine abuse [F15.10] 03/14/2016    Priority: High  . Amphetamine and psychostimulant-induced psychosis with hallucinations [facility](HCC) [F15.951] 03/14/2016    Priority: High  . PTSD (post-traumatic stress disorder) [F43.10] 10/03/2013    Priority: High  . 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 Polysubstance dependence (HCC) [F19.20] 10/02/2013    Priority: Low  . Polysubstance abuse [F19.10]   . Schizoaffective disorder, bipolar type (HCC) [F25.0] 10/02/2015  . Involuntary commitment [Z04.6] 10/02/2015  . Cannabis use disorder, moderate, dependence (HCC) [F12.20] 09/16/2015  . Opioid use disorder, moderate, dependence (HCC) [F11.20] 09/16/2015  . 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 Benzodiazepine dependence (HCC) [F13.20] 10/04/2013    Total Time spent with patient: 45 minutes   Musculoskeletal: Strength & Muscle Tone: within normal limits Gait & Station: normal Patient leans: N/A  Psychiatric Specialty Exam: Physical Exam  Constitutional: He is oriented to person, place, and time. He appears well-developed and well-nourished.  HENT:  Head: Normocephalic.  Neck: Normal range of motion.  Respiratory: Effort normal.  Musculoskeletal: Normal range of motion.  Neurological: He is alert and oriented to person, place, and time.  Psychiatric: His speech is normal and behavior is normal. Judgment and thought content normal. His mood appears anxious. Cognition and memory are  normal.    Review of Systems  Psychiatric/Behavioral: Positive for substance abuse. The patient is nervous/anxious.   All other systems reviewed and are negative.   Blood pressure 124/69, pulse 67, temperature 98 F (36.7 C), temperature source Oral, resp. rate 18, height 6' (1.829 m), weight 77.1 kg (170 lb), SpO2 100 %.Body mass index is 23.06 kg/m.  General Appearance: Dishelved   Eye Contact:  Good  Speech:  Normal Rate  Volume:  Normal  Mood:  Anxious, mild  Affect:  Congruent  Thought Process:  Coherent and Descriptions of Associations: Intact  Orientation:  Full (Time, Place, and Person)  Thought Content:  WDL and Logical  Suicidal Thoughts:  No  Homicidal Thoughts:  No  Memory:  Immediate;   Good Recent;   Good Remote;   Good  Judgement:  Fair  Insight:  Fair  Psychomotor Activity:  Normal  Concentration:  Concentration: Fair and Attention Span: Fair  Recall:  [employer]  Fund of Knowledge:  Fair  Language:  Good  Akathisia:  No  Handed:  Right  AIMS (if indicated):     Assets:  Housing Intimacy Leisure Time Physical Health Resilience Social Support  ADL's:  Intact  Cognition:  WNL  Sleep:       Mental Status Per Nursing Assessment::   On Admission:   methamphetamine abuse with psychosis  Demographic Factors:  Male, Caucasian and Gay, lesbian, or bisexual orientation  Loss Factors: NA  Historical Factors: NA  Risk Reduction Factors:   Sense of responsibility to family, Living with another person, especially a relative, Positive social support and Positive therapeutic relationship  Continued Clinical Symptoms:  Anxiety, mild  Cognitive Features That Contribute To Risk:  None    Suicide Risk:  Minimal: No identifiable suicidal ideation.  Patients presenting with no risk factors but  with morbid ruminations; may be classified as minimal risk based on the severity of the depressive symptoms    Plan Of Care/Follow-up recommendations:  Activity:  as  tolerated Diet:  heart healthy diet  Ora Bollig, NP 06/17/2016, 10:35 AM

## 2016-06-17 NOTE — Discharge Instructions (Signed)
For your ongoing behavioral health needs, you are advised to follow up with one of the following providers.  All are able to provide both mental health and substance abuse treatment.  Contact them at your earliest opportunity to ask about scheduling an intake appointment:       [facility]      [address]      [place], [place] [address]      [phone]      New patients are seen at the walk-in clinic every Tuesday from 9:00 am - 12:00 pm.       [facility]      [address]      [place], [place] [address]      [phone]      New patients are seen at their walk-in clinic.  Walk-in hours are Monday - Friday from 8:00 am - 12:00 pm, and from 1:00 pm - 3:00 pm.  Walk-in patients are seen on a first come, first served basis, so try to arrive as early as possible for the best chance of being seen the same day.  There is an initial fee of $22.50.       [facility]      [address]      [place], [place] [address]      [phone]

## 2016-06-17 NOTE — Consult Note (Signed)
[facility] Consult   Reason for Consult:  Substance abuse with psychosis Referring Physician:  EDP Patient Identification: Joshua Davila MRN:  [id number] Principal Diagnosis: Amphetamine and psychostimulant-induced psychosis with hallucinations Joshua Davila) Diagnosis:   Patient Active Problem List   Diagnosis Date Noted  . Amphetamine abuse [F15.10] 03/14/2016    Priority: High  . Amphetamine and psychostimulant-induced psychosis with hallucinations Joshua Davila) [F15.951] 03/14/2016    Priority: High  . PTSD (post-traumatic stress disorder) [F43.10] 10/03/2013    Priority: High  . Methamphetamine use disorder, severe (Joshua Davila) [F15.20] 06/20/2015    Priority: Low  . Polysubstance dependence (Joshua Davila) [F19.20] 10/02/2013    Priority: Low  . Polysubstance abuse [F19.10]   . Schizoaffective disorder, bipolar type (Joshua Davila) [F25.0] 10/02/2015  . Involuntary commitment [Z04.6] 10/02/2015  . Cannabis use disorder, moderate, dependence (Joshua Davila) [F12.20] 09/16/2015  . Opioid use disorder, moderate, dependence (Joshua Davila) [F11.20] 09/16/2015  . Substance or medication-induced bipolar and related disorder with onset during intoxication (Joshua Davila) [F19.94] 09/16/2015  . Substance-induced psychotic disorder with delusions (Joshua Davila) [F19.950] 09/16/2015  . 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 Benzodiazepine dependence (Joshua Davila) [F13.20] 10/04/2013    Total Time spent with patient: 45 minutes  Subjective:   Joshua Davila is a 36 y.o. male patient is stable for discharge.  HPI:  36 yo male who came to the ED under the influence of methadone, cannabis, and benzodiazepines with psychosis.  Today, he is clear and coherent with no suicidal/homicidal ideations, hallucinations, or withdrawal symptoms.  He complains of his rash hurting, gabapentin provided.  Stable for discharge.  Past Psychiatric History: substance abuse  Risk to Self: None Risk to Others: Homicidal Ideation: No Thoughts of Harm to Others:  (unknown  ) Comment - Thoughts of Harm to Others:  (none reported) Current Homicidal Intent: No Current Homicidal Plan: No Access to Homicidal Means: No Describe Access to Homicidal Means:  (n/a) Identified Victim:  (n/a) History of harm to others?: No Assessment of Violence: None Noted Violent Behavior Description:  (argumentative with questioning; uncooperative w/ questions) Does patient have access to weapons?: No Criminal Charges Pending?: No Does patient have a court date: No Prior Inpatient Therapy: Prior Inpatient Therapy: Yes Prior Therapy Dates: 10/2015, 08/2015, 05/2015 Prior Therapy Facilty/Provider(s): Joshua Davila Reason for Treatment: psychosis, drug use Prior Outpatient Therapy: Prior Outpatient Therapy: Yes Prior Therapy Dates: Unknown Prior Therapy Facilty/Provider(s): Unknown Reason for Treatment: Unknown Does patient have an ACCT team?: No Does patient have Intensive In-House Services?  : No Does patient have [facility] services? : No Does patient have P4CC services?: No  Past Medical History:  Past Medical History:  Diagnosis Date  . Depression   . Gender identity disorder   . Liver disorder   . PTSD (post-traumatic stress disorder)   . Schizoaffective disorder (Joshua Davila)    History reviewed. No pertinent surgical history. Family History:  Family History  Problem Relation Age of Onset  . Multiple sclerosis Mother   . Mental illness Neg Hx    Family Psychiatric  History: none Social History:  History  Alcohol Use  . Yes    Comment: occasional     History  Drug Use  . Types: Cocaine, Methamphetamines, Marijuana    Comment: last useof methamphetamines and marijuana yesterday    Social History   Social History  . Marital status: Single    Spouse name: N/A  . Number of children: N/A  . Years of education: N/A   Social History Main Topics  . Smoking status: Current Every  Day Smoker    Packs/day: 1.00    Types: Cigarettes  . Smokeless tobacco: Never Used  . Alcohol  use Yes     Comment: occasional  . Drug use: Yes    Types: Cocaine, Methamphetamines, Marijuana     Comment: last useof methamphetamines and marijuana yesterday  . Sexual activity: Not Currently   Other Topics Concern  . None   Social History Narrative  . None   Additional Social History:    Allergies:   Allergies  Allergen Reactions  . Penicillins Swelling, Rash and Other (See Comments)    Reaction:  Facial swelling Has patient had a PCN reaction causing immediate rash, facial/tongue/throat swelling, SOB or lightheadedness with hypotension: Yes Has patient had a PCN reaction causing severe rash involving mucus membranes or skin necrosis: No Has patient had a PCN reaction that required hospitalization No Has patient had a PCN reaction occurring within the last 10 years: No If all of the above answers are "NO", then may proceed with Cephalosporin use.  . Sulfa Antibiotics Rash    Labs:  Results for orders placed or performed during the Davila encounter of 06/16/16 (from the past 48 hour(s))  Comprehensive metabolic panel     Status: None   Collection Time: 06/16/16 10:11 AM  Result Value Ref Range   Sodium 138 135 - 145 mmol/L   Potassium 3.8 3.5 - 5.1 mmol/L   Chloride 106 101 - 111 mmol/L   CO2 23 22 - 32 mmol/L   Glucose, Bld 96 65 - 99 mg/dL   BUN 16 6 - 20 mg/dL   Creatinine, Ser 0.98 0.61 - 1.24 mg/dL   Calcium 9.4 8.9 - 10.3 mg/dL   Total Protein 7.8 6.5 - 8.1 g/dL   Albumin 4.2 3.5 - 5.0 g/dL   AST 32 15 - 41 U/L   ALT 30 17 - 63 U/L   Alkaline Phosphatase 64 38 - 126 U/L   Total Bilirubin 0.7 0.3 - 1.2 mg/dL   GFR calc non Af Amer >60 >60 mL/min   GFR calc Af Amer >60 >60 mL/min    Comment: (NOTE) The eGFR has been calculated using the CKD EPI equation. This calculation has not been validated in all clinical situations. eGFR's persistently <60 mL/min signify possible Chronic Kidney Disease.    Anion gap 9 5 - 15  cbc     Status: Abnormal    Collection Time: 06/16/16 10:11 AM  Result Value Ref Range   WBC 11.2 (H) 4.0 - 10.5 K/uL   RBC 4.33 4.22 - 5.81 MIL/uL   Hemoglobin 12.5 (L) 13.0 - 17.0 g/dL   HCT 35.6 (L) 39.0 - 52.0 %   MCV 82.2 78.0 - 100.0 fL   MCH 28.9 26.0 - 34.0 pg   MCHC 35.1 30.0 - 36.0 g/dL   RDW 14.0 11.5 - 15.5 %   Platelets 234 150 - 400 K/uL  Rapid urine drug screen (Davila performed)     Status: Abnormal   Collection Time: 06/16/16 10:11 AM  Result Value Ref Range   Opiates NONE DETECTED NONE DETECTED   Cocaine NONE DETECTED NONE DETECTED   Benzodiazepines POSITIVE (A) NONE DETECTED   Amphetamines POSITIVE (A) NONE DETECTED   Tetrahydrocannabinol POSITIVE (A) NONE DETECTED   Barbiturates NONE DETECTED NONE DETECTED    Comment:        DRUG SCREEN FOR MEDICAL PURPOSES ONLY.  IF CONFIRMATION IS NEEDED FOR ANY PURPOSE, NOTIFY LAB WITHIN 5 DAYS.  LOWEST DETECTABLE LIMITS FOR URINE DRUG SCREEN Drug Class       Cutoff (ng/mL) Amphetamine      1000 Barbiturate      200 Benzodiazepine   161 Tricyclics       096 Opiates          300 Cocaine          300 THC              50   Ethanol     Status: None   Collection Time: 06/16/16 10:25 AM  Result Value Ref Range   Alcohol, Ethyl (B) <5 <5 mg/dL    Comment:        LOWEST DETECTABLE LIMIT FOR SERUM ALCOHOL IS 5 mg/dL FOR MEDICAL PURPOSES ONLY   Salicylate level     Status: None   Collection Time: 06/16/16 10:25 AM  Result Value Ref Range   Salicylate Lvl <0.4 2.8 - 30.0 mg/dL  Acetaminophen level     Status: Abnormal   Collection Time: 06/16/16 10:25 AM  Result Value Ref Range   Acetaminophen (Tylenol), Serum <10 (L) 10 - 30 ug/mL    Comment:        THERAPEUTIC CONCENTRATIONS VARY SIGNIFICANTLY. A RANGE OF 10-30 ug/mL MAY BE AN EFFECTIVE CONCENTRATION FOR MANY PATIENTS. HOWEVER, SOME ARE BEST TREATED AT CONCENTRATIONS OUTSIDE THIS RANGE. ACETAMINOPHEN CONCENTRATIONS >150 ug/mL AT 4 HOURS AFTER INGESTION AND >50 ug/mL AT  12 HOURS AFTER INGESTION ARE OFTEN ASSOCIATED WITH TOXIC REACTIONS.     Current Facility-Administered Medications  Medication Dose Route Frequency Provider Last Rate Last Dose  . acetaminophen (TYLENOL) tablet 650 mg  650 mg Oral Q4H PRN Nona Dell, PA-C      . alum & mag hydroxide-simeth (MAALOX/MYLANTA) 200-200-20 MG/5ML suspension 30 mL  30 mL Oral PRN Nona Dell, PA-C      . gabapentin (NEURONTIN) capsule 300 mg  300 mg Oral TID Patrecia Pour, NP   300 mg at 06/16/16 2142  . ibuprofen (ADVIL,MOTRIN) tablet 600 mg  600 mg Oral Q8H PRN Nona Dell, PA-C      . ondansetron [facility]ila) tablet 4 mg  4 mg Oral Q8H PRN Nona Dell, [place]       Current Outpatient Prescriptions  Medication Sig Dispense Refill  . ibuprofen (ADVIL,MOTRIN) 200 MG tablet Take 800 mg by mouth every 6 (six) hours as needed for fever, headache, mild pain, moderate pain or cramping.    Marland Kitchen atomoxetine (STRATTERA) 40 MG capsule Take 1 capsule (40 mg total) by mouth daily. For ADHD/depression (Patient not taking: Reported on 05/30/2016) 30 capsule 0  . atorvastatin (LIPITOR) 20 MG tablet Take 1 tablet (20 mg total) by mouth daily. For high Cholesterol (Patient not taking: Reported on 05/30/2016) 1 tablet 0  . benztropine (COGENTIN) 0.5 MG tablet Take 1 tablet (0.5 mg total) by mouth 2 (two) times daily. For prevention of drug induced tremors (Patient not taking: Reported on 05/30/2016) 60 tablet 0  . divalproex (DEPAKOTE) 250 MG DR tablet Take 1 tablet (250 mg total) by mouth daily after breakfast. For mood stabilization (Patient not taking: Reported on 05/30/2016) 30 tablet 0  . divalproex (DEPAKOTE) 500 MG DR tablet Take 2 tablets (1,000 mg total) by mouth daily at 8 pm. For mood stabilization (Patient not taking: Reported on 05/30/2016) 60 tablet 0  . emtricitabine-tenofovir (TRUVADA) 200-300 MG tablet Take 1 tablet by mouth daily. For pre-exposure to HIV (Patient not taking:  Reported on  05/30/2016)    . LORazepam (ATIVAN) 1 MG tablet Take 1 tablet (1 mg total) by mouth every 8 (eight) hours as needed for anxiety (agitation). (Patient not taking: Reported on 05/30/2016) 12 tablet 0  . nicotine polacrilex (NICORETTE) 2 MG gum Take 1 each (2 mg total) by mouth as needed for smoking cessation. (Patient not taking: Reported on 05/30/2016) 100 tablet 0  . ondansetron (ZOFRAN) 4 MG tablet Take 1 tablet (4 mg total) by mouth every 8 (eight) hours as needed for nausea or vomiting. (Patient not taking: Reported on 05/30/2016) 8 tablet 0  . PARoxetine (PAXIL) 20 MG tablet Take 1 tablet (20 mg total) by mouth daily. For depression (Patient not taking: Reported on 05/30/2016)    . traZODone (DESYREL) 50 MG tablet Take 1 tablet (50 mg total) by mouth at bedtime as needed for sleep. (Patient not taking: Reported on 05/30/2016) 30 tablet 0  . ziprasidone (GEODON) 20 MG capsule Take 1 capsule (20 mg total) by mouth daily with supper. For mood control (Patient not taking: Reported on 05/30/2016) 30 capsule 0    Musculoskeletal: Strength & Muscle Tone: within normal limits Gait & Station: normal Patient leans: N/A  Psychiatric Specialty Exam: Physical Exam  Constitutional: He is oriented to person, place, and time. He appears well-developed and well-nourished.  HENT:  Head: Normocephalic.  Neck: Normal range of motion.  Respiratory: Effort normal.  Musculoskeletal: Normal range of motion.  Neurological: He is alert and oriented to person, place, and time.  Psychiatric: His speech is normal and behavior is normal. Judgment and thought content normal. His mood appears anxious. Cognition and memory are normal.    Review of Systems  Psychiatric/Behavioral: Positive for substance abuse. The patient is nervous/anxious.   All other systems reviewed and are negative.   Blood pressure 124/69, pulse 67, temperature 98 F (36.7 C), temperature source Oral, resp. rate 18, height 6' (1.829 m),  weight 77.1 kg (170 lb), SpO2 100 %.Body mass index is 23.06 kg/m.  General Appearance: Dishelved   Eye Contact:  Good  Speech:  Normal Rate  Volume:  Normal  Mood:  Anxious, mild  Affect:  Congruent  Thought Process:  Coherent and Descriptions of Associations: Intact  Orientation:  Full (Time, Place, and Person)  Thought Content:  WDL and Logical  Suicidal Thoughts:  No  Homicidal Thoughts:  No  Memory:  Immediate;   Good Recent;   Good Remote;   Good  Judgement:  Fair  Insight:  Fair  Psychomotor Activity:  Normal  Concentration:  Concentration: Fair and Attention Span: Fair  Recall:  [employer] of Knowledge:  Fair  Language:  Good  Akathisia:  No  Handed:  Right  AIMS (if indicated):     Assets:  Housing Intimacy Leisure Time Physical Health Resilience Social Support  ADL's:  Intact  Cognition:  WNL  Sleep:        Treatment Plan Summary: Daily contact with patient to assess and evaluate symptoms and progress in treatment, Medication management and Plan ampethamine abuse with psychostimulant psychosis with hallucinations: -Crisis stabilization -Medication management:  Medical medications started along with Gabapentin 300 mg TID for withdrawal symptoms and pain -Individual and substance abuse counseling  Disposition: No evidence of imminent risk to self or others at present.    Waylan Boga, NP 06/17/2016 10:28 AM  Patient seen face-to-face for psychiatric evaluation, chart reviewed and case discussed with the physician extender and developed treatment plan. Reviewed the information documented and agree  with the treatment plan. Corena Pilgrim, MD

## 2016-06-17 NOTE — ED Notes (Signed)
Patient discharged to home.  He became angry when I told him he was being discharged.  He stated he wanted to stay here and sleep as he had nowhere to go.  I offered him a bus pass and a list of resources for shelters in the area.  He told me I could "fuck my resources."  I again asked if he wanted a bus pass and he would not answer me.  He left the unit ambulatory and was escorted to the lobby with [employer] and security.

## 2016-06-17 NOTE — BH Assessment (Signed)
[facility] Assessment Progress Note   Per Thedore MinsMojeed Akintayo, MD, this pt would benefit from psychiatric hospitalization at this time.  This writer sought placement for the pt, who is under voluntary status.  Late in the afternoon [facility]  This was staffed with Nanine MeansJamison Lord, [name], who determined that pt is safe for discharge.  Discharge instructions advise pt to follow [facility] with [facility], [facility], or [facility].  Pt's nurse, Dawnaly, has been notified.  Doylene Canninghomas Emett Stapel, MA Triage Specialist [phone]

## 2016-09-08 ENCOUNTER — Encounter (HOSPITAL_COMMUNITY): Payer: Self-pay

## 2016-09-08 ENCOUNTER — Emergency Department (HOSPITAL_COMMUNITY)
Admission: EM | Admit: 2016-09-08 | Discharge: 2016-09-08 | Disposition: A | Discharge status: Home / Self Care | Payer: Medicare Other | Attending: Emergency Medicine | Admitting: Emergency Medicine

## 2016-09-08 DIAGNOSIS — R197 Diarrhea, unspecified: Secondary | ICD-10-CM | POA: Diagnosis not present

## 2016-09-08 DIAGNOSIS — F419 Anxiety disorder, unspecified: Secondary | ICD-10-CM | POA: Diagnosis not present

## 2016-09-08 DIAGNOSIS — Z79899 Other long term (current) drug therapy: Secondary | ICD-10-CM | POA: Insufficient documentation

## 2016-09-08 DIAGNOSIS — R112 Nausea with vomiting, unspecified: Secondary | ICD-10-CM

## 2016-09-08 DIAGNOSIS — F17210 Nicotine dependence, cigarettes, uncomplicated: Secondary | ICD-10-CM | POA: Insufficient documentation

## 2016-09-08 LAB — COMPREHENSIVE METABOLIC PANEL
ALBUMIN: 4.5 g/dL (ref 3.5–5.0)
ALK PHOS: 56 U/L (ref 38–126)
ALT: 37 U/L (ref 17–63)
AST: 35 U/L (ref 15–41)
Anion gap: 9 (ref 5–15)
BILIRUBIN TOTAL: 0.5 mg/dL (ref 0.3–1.2)
BUN: 18 mg/dL (ref 6–20)
CALCIUM: 9.7 mg/dL (ref 8.9–10.3)
CO2: 23 mmol/L (ref 22–32)
CREATININE: 0.94 mg/dL (ref 0.61–1.24)
Chloride: 102 mmol/L (ref 101–111)
GFR calc Af Amer: >60 mL/min (ref >60)
GFR calc non Af Amer: >60 mL/min (ref >60)
GLUCOSE: 103 mg/dL — AB (ref 65–99)
Potassium: 4.2 mmol/L (ref 3.5–5.1)
SODIUM: 134 mmol/L — AB (ref 135–145)
TOTAL PROTEIN: 7.8 g/dL (ref 6.5–8.1)

## 2016-09-08 LAB — CBC
HCT: 39.7 % (ref 39.0–52.0)
HEMOGLOBIN: 13.7 g/dL (ref 13.0–17.0)
MCH: 28.9 pg (ref 26.0–34.0)
MCHC: 34.5 g/dL (ref 30.0–36.0)
MCV: 83.8 fL (ref 78.0–100.0)
PLATELETS: 217 10*3/uL (ref 150–400)
RBC: 4.74 MIL/uL (ref 4.22–5.81)
RDW: 14.7 % (ref 11.5–15.5)
WBC: 9.2 10*3/uL (ref 4.0–10.5)

## 2016-09-08 LAB — URINALYSIS, ROUTINE W REFLEX MICROSCOPIC
Bilirubin Urine: NEGATIVE
Glucose, UA: NEGATIVE mg/dL
Hgb urine dipstick: NEGATIVE
KETONES UR: 5 mg/dL — AB
Leukocytes, UA: NEGATIVE
NITRITE: NEGATIVE
PROTEIN: NEGATIVE mg/dL
Specific Gravity, Urine: 1.034 — ABNORMAL HIGH (ref 1.005–1.030)
pH: 5 (ref 5.0–8.0)

## 2016-09-08 LAB — LIPASE, BLOOD: Lipase: 25 U/L (ref 11–51)

## 2016-09-08 MED ORDER — HYDROXYZINE HCL 25 MG PO TABS
50.0000 mg | ORAL_TABLET | Freq: Once | ORAL | Status: AC
  Administered 2016-09-08: 50 mg via ORAL
  Filled 2016-09-08: qty 2

## 2016-09-08 MED ORDER — ONDANSETRON 4 MG PO TBDP
4.0000 mg | ORAL_TABLET | Freq: Once | ORAL | Status: AC | PRN
  Administered 2016-09-08: 4 mg via ORAL

## 2016-09-08 MED ORDER — HYDROXYZINE HCL 50 MG PO TABS: 25.0000 mg | ORAL_TABLET | Freq: Four times a day (QID) | ORAL | 0 refills | Status: DC | PRN

## 2016-09-08 MED ORDER — ONDANSETRON 4 MG PO TBDP
4.0000 mg | ORAL_TABLET | Freq: Once | ORAL | Status: AC
  Administered 2016-09-08: 4 mg via ORAL
  Filled 2016-09-08: qty 1

## 2016-09-08 MED ORDER — ONDANSETRON 4 MG PO TBDP
ORAL_TABLET | ORAL | Status: AC
  Administered 2016-09-08: 4 mg
  Filled 2016-09-08: qty 1

## 2016-09-08 NOTE — ED Triage Notes (Signed)
Pt endorses generalized bodyaches, n/v/d x 2 weeks. VSS.

## 2016-09-08 NOTE — ED Notes (Signed)
Pt at [facility] requesting food, informed pt that he must be seen by a provider first especially since he is here for N/V/D.
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
## 2016-09-08 NOTE — ED Notes (Signed)
Patient left at this time with all belongings. 

## 2016-09-18 ENCOUNTER — Encounter (HOSPITAL_COMMUNITY): Payer: Self-pay | Admitting: Emergency Medicine

## 2016-09-18 ENCOUNTER — Emergency Department (HOSPITAL_COMMUNITY)
Admission: EM | Admit: 2016-09-18 | Discharge: 2016-09-20 | Disposition: A | Discharge status: Other Acute Inpatient Hospital | Payer: Medicare Other | Attending: Emergency Medicine | Admitting: Emergency Medicine

## 2016-09-18 DIAGNOSIS — F17210 Nicotine dependence, cigarettes, uncomplicated: Secondary | ICD-10-CM | POA: Insufficient documentation

## 2016-09-18 DIAGNOSIS — Z9114 Patient's other noncompliance with medication regimen: Secondary | ICD-10-CM | POA: Insufficient documentation

## 2016-09-18 DIAGNOSIS — F649 Gender identity disorder, unspecified: Secondary | ICD-10-CM | POA: Diagnosis not present

## 2016-09-18 DIAGNOSIS — Z9119 Patient's noncompliance with other medical treatment and regimen: Secondary | ICD-10-CM | POA: Diagnosis not present

## 2016-09-18 DIAGNOSIS — F23 Brief psychotic disorder: Secondary | ICD-10-CM | POA: Insufficient documentation

## 2016-09-18 DIAGNOSIS — F259 Schizoaffective disorder, unspecified: Secondary | ICD-10-CM | POA: Insufficient documentation

## 2016-09-18 DIAGNOSIS — F29 Unspecified psychosis not due to a substance or known physiological condition: Secondary | ICD-10-CM | POA: Diagnosis not present

## 2016-09-18 DIAGNOSIS — Z79899 Other long term (current) drug therapy: Secondary | ICD-10-CM | POA: Diagnosis not present

## 2016-09-18 DIAGNOSIS — F1220 Cannabis dependence, uncomplicated: Secondary | ICD-10-CM | POA: Diagnosis not present

## 2016-09-18 DIAGNOSIS — F329 Major depressive disorder, single episode, unspecified: Secondary | ICD-10-CM | POA: Diagnosis present

## 2016-09-18 DIAGNOSIS — F061 Catatonic disorder due to known physiological condition: Secondary | ICD-10-CM | POA: Diagnosis not present

## 2016-09-18 LAB — CBC WITH DIFFERENTIAL/PLATELET
Basophils Absolute: 0 10*3/uL (ref 0.0–0.1)
Basophils Relative: 0 %
EOS ABS: 0.1 10*3/uL (ref 0.0–0.7)
EOS PCT: 1 %
HCT: 38.3 % — ABNORMAL LOW (ref 39.0–52.0)
Hemoglobin: 13.3 g/dL (ref 13.0–17.0)
Lymphocytes Relative: 27 %
Lymphs Abs: 2.3 10*3/uL (ref 0.7–4.0)
MCH: 29.2 pg (ref 26.0–34.0)
MCHC: 34.7 g/dL (ref 30.0–36.0)
MCV: 84 fL (ref 78.0–100.0)
MONO ABS: 0.5 10*3/uL (ref 0.1–1.0)
Monocytes Relative: 6 %
Neutro Abs: 5.7 10*3/uL (ref 1.7–7.7)
Neutrophils Relative %: 66 %
Platelets: 226 10*3/uL (ref 150–400)
RBC: 4.56 MIL/uL (ref 4.22–5.81)
RDW: 14.8 % (ref 11.5–15.5)
WBC: 8.6 10*3/uL (ref 4.0–10.5)

## 2016-09-18 LAB — RAPID URINE DRUG SCREEN, HOSP PERFORMED
Amphetamines: POSITIVE — AB
Barbiturates: NOT DETECTED
Benzodiazepines: NOT DETECTED
COCAINE: NOT DETECTED
Opiates: NOT DETECTED
Tetrahydrocannabinol: POSITIVE — AB

## 2016-09-18 LAB — BASIC METABOLIC PANEL
Anion gap: 9 (ref 5–15)
BUN: 11 mg/dL (ref 6–20)
CO2: 24 mmol/L (ref 22–32)
CREATININE: 0.87 mg/dL (ref 0.61–1.24)
Calcium: 9.2 mg/dL (ref 8.9–10.3)
Chloride: 99 mmol/L — ABNORMAL LOW (ref 101–111)
GFR calc non Af Amer: >60 mL/min (ref >60)
GLUCOSE: 136 mg/dL — AB (ref 65–99)
Potassium: 3.7 mmol/L (ref 3.5–5.1)
Sodium: 132 mmol/L — ABNORMAL LOW (ref 135–145)

## 2016-09-18 MED ORDER — ACETAMINOPHEN 325 MG PO TABS: 650.0000 mg | ORAL_TABLET | ORAL | Status: DC | PRN

## 2016-09-18 MED ORDER — HYDROXYZINE HCL 25 MG PO TABS
25.0000 mg | ORAL_TABLET | Freq: Four times a day (QID) | ORAL | Status: DC | PRN
  Administered 2016-09-19: 25 mg via ORAL
  Filled 2016-09-18: qty 1

## 2016-09-18 MED ORDER — GABAPENTIN 300 MG PO CAPS
300.0000 mg | ORAL_CAPSULE | Freq: Three times a day (TID) | ORAL | Status: DC
  Administered 2016-09-18 – 2016-09-20 (×6): 300 mg via ORAL
  Filled 2016-09-18 (×6): qty 1

## 2016-09-18 MED ORDER — ONDANSETRON HCL 4 MG PO TABS
4.0000 mg | ORAL_TABLET | Freq: Three times a day (TID) | ORAL | Status: DC | PRN
  Administered 2016-09-19: 4 mg via ORAL
  Filled 2016-09-18: qty 1

## 2016-09-18 MED ORDER — PAROXETINE HCL 20 MG PO TABS
20.0000 mg | ORAL_TABLET | Freq: Every day | ORAL | Status: DC
  Administered 2016-09-18 – 2016-09-20 (×3): 20 mg via ORAL
  Filled 2016-09-18 (×3): qty 1

## 2016-09-18 MED ORDER — IBUPROFEN 400 MG PO TABS: 600.0000 mg | ORAL_TABLET | Freq: Three times a day (TID) | ORAL | Status: DC | PRN

## 2016-09-18 MED ORDER — LORAZEPAM 1 MG PO TABS
1.0000 mg | ORAL_TABLET | Freq: Three times a day (TID) | ORAL | Status: DC | PRN
  Administered 2016-09-19: 1 mg via ORAL
  Filled 2016-09-18: qty 1

## 2016-09-18 MED ORDER — ATORVASTATIN CALCIUM 10 MG PO TABS
20.0000 mg | ORAL_TABLET | Freq: Every day | ORAL | Status: DC
  Administered 2016-09-18 – 2016-09-19 (×2): 20 mg via ORAL
  Filled 2016-09-18: qty 2
  Filled 2016-09-18: qty 1

## 2016-09-18 MED ORDER — ALUM & MAG HYDROXIDE-SIMETH 200-200-20 MG/5ML PO SUSP: 30.0000 mL | Freq: Four times a day (QID) | ORAL | Status: DC | PRN

## 2016-09-18 MED ORDER — DIVALPROEX SODIUM 250 MG PO DR TAB
1000.0000 mg | DELAYED_RELEASE_TABLET | Freq: Every day | ORAL | Status: DC
  Administered 2016-09-18 – 2016-09-19 (×2): 1000 mg via ORAL
  Filled 2016-09-18 (×2): qty 4

## 2016-09-18 MED ORDER — ZOLPIDEM TARTRATE 5 MG PO TABS
5.0000 mg | ORAL_TABLET | Freq: Every evening | ORAL | Status: DC | PRN
  Filled 2016-09-18: qty 1

## 2016-09-18 MED ORDER — ATOMOXETINE HCL 40 MG PO CAPS
40.0000 mg | ORAL_CAPSULE | Freq: Every day | ORAL | Status: DC
  Administered 2016-09-18 – 2016-09-19 (×2): 40 mg via ORAL
  Filled 2016-09-18 (×5): qty 1

## 2016-09-18 MED ORDER — BENZTROPINE MESYLATE 1 MG PO TABS
0.5000 mg | ORAL_TABLET | Freq: Two times a day (BID) | ORAL | Status: DC
  Administered 2016-09-18 – 2016-09-20 (×4): 0.5 mg via ORAL
  Filled 2016-09-18 (×4): qty 1

## 2016-09-18 MED ORDER — EMTRICITABINE-TENOFOVIR AF 200-25 MG PO TABS: 1.0000 | ORAL_TABLET | Freq: Every day | ORAL | Status: DC

## 2016-09-18 MED ORDER — NICOTINE 14 MG/24HR TD PT24
14.0000 mg | MEDICATED_PATCH | Freq: Every day | TRANSDERMAL | Status: DC
  Filled 2016-09-18: qty 1

## 2016-09-18 MED ORDER — TRAZODONE HCL 50 MG PO TABS: 50.0000 mg | ORAL_TABLET | Freq: Every evening | ORAL | Status: DC | PRN

## 2016-09-18 MED ORDER — DIVALPROEX SODIUM 250 MG PO DR TAB
250.0000 mg | DELAYED_RELEASE_TABLET | Freq: Every day | ORAL | Status: DC
  Administered 2016-09-19 – 2016-09-20 (×2): 250 mg via ORAL
  Filled 2016-09-18 (×2): qty 1

## 2016-09-18 MED ORDER — ZIPRASIDONE HCL 20 MG PO CAPS
20.0000 mg | ORAL_CAPSULE | Freq: Every day | ORAL | Status: DC
  Administered 2016-09-18 – 2016-09-19 (×2): 20 mg via ORAL
  Filled 2016-09-18 (×2): qty 1

## 2016-09-18 MED ORDER — DIVALPROEX SODIUM 250 MG PO DR TAB: 1000.0000 mg | DELAYED_RELEASE_TABLET | Freq: Every day | ORAL | Status: DC

## 2016-09-18 MED ORDER — SODIUM CHLORIDE 0.9 % IV BOLUS (SEPSIS): 1000.0000 mL | Freq: Once | INTRAVENOUS | Status: DC

## 2016-09-18 MED ORDER — ONDANSETRON HCL 4 MG PO TABS: 4.0000 mg | ORAL_TABLET | Freq: Three times a day (TID) | ORAL | Status: DC | PRN

## 2016-09-18 NOTE — ED Notes (Addendum)
Spoke to CSW Carney BernJean faxed back the paper work to them.

## 2016-09-18 NOTE — ED Notes (Signed)
ED Provider at bedside. 

## 2016-09-18 NOTE — ED Notes (Signed)
Pt currently sleeping
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
## 2016-09-18 NOTE — Progress Notes (Signed)
CS[facility] was [facility]   [place] [facility] however she requested the referral to be sent to facilitate the process for placement.   CSW will continue to follow.  Joshua Davila MSW, LCSWA CSW Disposition [phone]

## 2016-09-18 NOTE — ED Notes (Signed)
TTS at bedside. 

## 2016-09-18 NOTE — ED Notes (Signed)
Inventory pt things. Put in locker 5. valuables taken to security envelope number [id number].

## 2016-09-18 NOTE — Progress Notes (Signed)
[facility]ed [facility] and consent to transport form to RN.    Please make sure patient and EDP sign both forms as needed.   Fax signed forms to  [phone]  Baldo DaubJolan Genesys Coggeshall MSW, LCSWA CSW Disposition [phone]

## 2016-09-18 NOTE — ED Notes (Signed)
Pt wanded by security and changed into purple scrubs. Pt belongings placed in nurse's station - not inventoried at this time. Pt calm and cooperative.

## 2016-09-18 NOTE — ED Provider Notes (Addendum)
[facility] Provider Note   CSN: [id number] Arrival date & time: 09/18/16  0357     History   Chief Complaint Chief Complaint  Patient presents with  . Depression  . Anxiety  . Psychiatric Evaluation    HPI Joshua Davila is a 36 y.o. male.  The history is provided by the patient. The history is limited by the condition of the patient (Psychiatric disorder).  He complains that he is stressed out. He was brought in by EMS with reports of having gotten into an arguement with his spouse. When asked about suicidal ideation, patient stated that he thought he already was dead. He then asks why he can't be dead. He will not directly answer questions about suicidal ideation. He states he does not think he is having any hallucinations. He denies drug or alcohol use.  Past Medical History:  Diagnosis Date  . Depression   . Gender identity disorder   . Liver disorder   . PTSD (post-traumatic stress disorder)   . Schizoaffective disorder [facility])     Patient Active Problem List   Diagnosis Date Noted  . 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 Amphetamine and psychostimulant-induced psychosis with hallucinations (HCC) 03/14/2016  . Polysubstance abuse   . Schizoaffective disorder, bipolar type (HCC) 10/02/2015  . Involuntary commitment 10/02/2015  . Cannabis use disorder, moderate, dependence (HCC) 09/16/2015  . Opioid use disorder, moderate, dependence (HCC) 09/16/2015  . Substance or medication-induced bipolar and related disorder with onset during intoxication (HCC) 09/16/2015  . 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 Benzodiazepine dependence (HCC) 10/04/2013  . PTSD (post-traumatic stress disorder) 10/03/2013  . Polysubstance dependence (HCC) 10/02/2013    History reviewed. No pertinent surgical history.     Home Medications    Prior to Admission  medications   Medication Sig Start Date End Date Taking? Authorizing Provider  atomoxetine (STRATTERA) 40 MG capsule Take 1 capsule (40 mg total) by mouth daily. For ADHD/depression Patient not taking: Reported on 05/30/2016 11/11/15   Armandina Stammer I, NP  atorvastatin (LIPITOR) 20 MG tablet Take 1 tablet (20 mg total) by mouth daily. For high Cholesterol Patient not taking: Reported on 05/30/2016 11/11/15   Armandina Stammer I, NP  benztropine (COGENTIN) 0.5 MG tablet Take 1 tablet (0.5 mg total) by mouth 2 (two) times daily. For prevention of drug induced tremors Patient not taking: Reported on 05/30/2016 11/11/15   Armandina Stammer I, NP  divalproex (DEPAKOTE) 250 MG DR tablet Take 1 tablet (250 mg total) by mouth daily after breakfast. For mood stabilization Patient not taking: Reported on 05/30/2016 11/11/15   Armandina Stammer I, NP  divalproex (DEPAKOTE) 500 MG DR tablet Take 2 tablets (1,000 mg total) by mouth daily at 8 pm. For mood stabilization Patient not taking: Reported on 05/30/2016 11/11/15   Armandina Stammer I, NP  emtricitabine-tenofovir (TRUVADA) 200-300 MG tablet Take 1 tablet by mouth daily. For pre-exposure to HIV Patient not taking: Reported on 05/30/2016 11/11/15   Armandina Stammer I, NP  gabapentin (NEURONTIN) 300 MG capsule Take 1 capsule (300 mg total) by mouth 3 (three) times daily. Patient not taking: Reported on 09/08/2016 06/17/16   Charm Rings, NP  hydrOXYzine (ATARAX/VISTARIL) 50 MG tablet Take 0.5 tablets (25 mg total) by mouth every 6 (six) hours as needed for anxiety. 09/08/16   Horton, Mayer Masker, MD  LORazepam (ATIVAN) 1 MG tablet Take 1 tablet (1 mg  total) by mouth every 8 (eight) hours as needed for anxiety (agitation). Patient not taking: Reported on 05/30/2016 11/11/15   Armandina Stammer I, NP  nicotine polacrilex (NICORETTE) 2 MG gum Take 1 each (2 mg total) by mouth as needed for smoking cessation. Patient not taking: Reported on 05/30/2016 11/11/15   Armandina Stammer I, NP  ondansetron (ZOFRAN) 4  MG tablet Take 1 tablet (4 mg total) by mouth every 8 (eight) hours as needed for nausea or vomiting. Patient not taking: Reported on 05/30/2016 11/11/15   Armandina Stammer I, NP  PARoxetine (PAXIL) 20 MG tablet Take 1 tablet (20 mg total) by mouth daily. For depression Patient not taking: Reported on 05/30/2016 09/19/15   Armandina Stammer I, NP  traZODone (DESYREL) 50 MG tablet Take 1 tablet (50 mg total) by mouth at bedtime as needed for sleep. Patient not taking: Reported on 05/30/2016 11/11/15   Armandina Stammer I, NP  ziprasidone (GEODON) 20 MG capsule Take 1 capsule (20 mg total) by mouth daily with supper. For mood control Patient not taking: Reported on 05/30/2016 11/11/15   Sanjuana Kava, NP    Family History Family History  Problem Relation Age of Onset  . Multiple sclerosis Mother   . Mental illness Neg Hx     Social History Social History  Substance Use Topics  . Smoking status: Current Every Day Smoker    Packs/day: 1.00    Types: Cigarettes  . Smokeless tobacco: Never Used  . Alcohol use Yes     Comment: occasional     Allergies   Penicillins and Sulfa antibiotics   Review of Systems Review of Systems  Unable to perform ROS: Psychiatric disorder     Physical Exam Updated Vital Signs BP (!) 87/54 (BP Location: Right Arm)   Pulse 78   Temp 98.7 F (37.1 C) (Oral)   Resp 18   Ht 6' (1.829 m)   Wt 83 kg (183 lb)   SpO2 99%   BMI 24.82 kg/m   Physical Exam  Nursing note and vitals reviewed.  36 year old male, resting comfortably and in no acute distress. Vital signs are significant for hypotension. Oxygen saturation is 99%, which is normal. Head is normocephalic and atraumatic. PERRLA, EOMI. Oropharynx is clear. Neck is nontender and supple without adenopathy or JVD. Back is nontender and there is no CVA tenderness. Lungs are clear without rales, wheezes, or rhonchi. Chest is nontender. Heart has regular rate and rhythm without murmur. Abdomen is soft, flat,  nontender without masses or hepatosplenomegaly and peristalsis is normoactive. Extremities have no cyanosis or edema, full range of motion is present. Skin is warm and dry without rash. Neurologic: Cranial nerves are intact, there are no motor or sensory deficits. Psychiatric: He is awake and oriented, but seems markedly depressed with very flat affect. Speech is delayed and slow. He will not make eye contact.  ED Treatments / Results  Labs (all labs ordered are listed, but only abnormal results are displayed) Labs Reviewed  BASIC METABOLIC PANEL - Abnormal; Notable for the following:       Result Value   Sodium 132 (*)    Chloride 99 (*)    Glucose, Bld 136 (*)    All other components within normal limits  CBC WITH DIFFERENTIAL/PLATELET - Abnormal; Notable for the following:    HCT 38.3 (*)    All other components within normal limits  RAPID URINE DRUG SCREEN, HOSP PERFORMED - Abnormal; Notable for the following:  Amphetamines POSITIVE (*)    Tetrahydrocannabinol POSITIVE (*)    All other components within normal limits     Procedures Procedures (including critical care time)  Medications Ordered in ED Medications  zolpidem (AMBIEN) tablet 5 mg (not administered)  ondansetron (ZOFRAN) tablet 4 mg (not administered)  nicotine (NICODERM CQ - dosed in mg/24 hours) patch 14 mg (not administered)  ibuprofen (ADVIL,MOTRIN) tablet 600 mg (not administered)  acetaminophen (TYLENOL) tablet 650 mg (not administered)  alum & mag hydroxide-simeth (MAALOX/MYLANTA) 200-200-20 MG/5ML suspension 30 mL (not administered)     Initial Impression / Assessment and Plan / ED Course  I have reviewed the triage vital signs and the nursing notes.  Pertinent labs & imaging results that were available during my care of the patient were reviewed by me and considered in my medical decision making (see chart for details).  History of schizoaffective disorder with apparent exacerbation. Screening  labs are obtained, and TTS consultation will be obtained. Because of low blood pressure initially, he will be given a liter of saline.Old records are reviewed, and he was seen at another ED where workup included CT of head and metabolic panel. No indication to repeat CT scan today.  He refused IVs, but blood pressure has come up with oral hydration. Laboratory workup is significant for sodium of 132 which is not felt to be clinically significant. TTS consultation is appreciated, he meets inpatient criteria. Placement is pending.  Final Clinical Impressions(s) / ED Diagnoses   Final diagnoses:  Brief psychotic disorder  Noncompliance with medication regimen    New Prescriptions New Prescriptions   No medications on file     Dione BoozeGlick, Philomena Buttermore, MD 09/18/16 [id number]    Dione BoozeGlick, Aleigha Gilani, MD 09/18/16 [id number]    Dione BoozeGlick, Seleen Walter, MD 09/20/16 [phone]

## 2016-09-18 NOTE — ED Notes (Signed)
Pt ambulated to and from bathroom without difficulty.

## 2016-09-18 NOTE — Progress Notes (Signed)
CSW attempted to contact [facility] Dav[facility] at  [facility] to confirm that patient has VA benefits. No answer, left message.   [place].   CSW will continue to follow.   Baldo DaubJolan Neeraj Housand MSW, LCSWA CSW Disposition [phone]

## 2016-09-19 NOTE — ED Notes (Signed)
Breakfast tray ordered 

## 2016-09-19 NOTE — ED Triage Notes (Signed)
Pt hitting the mattress on his bed. Pt reported he needed something to calm down.

## 2016-09-19 NOTE — BH Assessment (Signed)
[facility] Assessment Progress Note  Pt reassessed this AM. Pt was extremely irritable and labile. He denied SI and HI ("not right now") and denied ever having AVH. Pt then begin to speak aggressively to clinician, sitting up in the bed asking, "are you a cop or a doctor?".  He snapped his fingers several times and asked clinician how many times did he snap his fingers and called the clinician a "live wire". Pt rambled about other things concerning how clinician was weird and then put his hospital bed back down, closed his eyes and stopped talking as if he went to sleep. IP treatment is still needed.   Johny ShockSamantha M. Ladona Ridgelaylor, MS, NCC, LPCA Counselor

## 2016-09-19 NOTE — ED Triage Notes (Signed)
Pt awake and agitated and hitting the bed.

## 2016-09-19 NOTE — ED Notes (Signed)
RN found pt attempting to sleep, RN spoke long enough to introduce self and ask about needs.  Pt stated he was "fine".

## 2016-09-19 NOTE — ED Triage Notes (Signed)
Pt eating lunch

## 2016-09-19 NOTE — Progress Notes (Signed)
Per Gunnar F[facility]Paula at [facility], pt has been declined due to medical reasons.  Princess BruinsAquicha Florina Glas, MSW, LCSWA TTS Specialist [phone]

## 2016-09-19 NOTE — ED Notes (Signed)
Dinner ordered 

## 2016-09-19 NOTE — ED Triage Notes (Signed)
Pt reported he needed medication for his anxiety. Prn given

## 2016-09-19 NOTE — Progress Notes (Signed)
Patient meets inpatient treatment criteria per Nira ConnJason Berry, PA, on 09/18/16.  Writer followed up with referral at the following inpatient treatment facilities: [facility] - writer left voicemail with April inquiring about bed status. [facility]'s [facility] with updated vitals and transfer form signed by EDP and patient.  [facility] - patient is under review, per Lassalle ComunidadFatima. [facility], call back later, referral not reviewed yet. [facility] - no answer. [facility] - at [facility] [place] - per Selena BattenKim, will contact later with updates. [facility] - per Selena BattenKim, at capacity, but can refax referral. [facility] - left voicemail inquiring about bed status. [place] - no answer.  [facility] - due medical  CSW in disposition will continue to follow up and seek placement for patient.  [place] Abtsatia Kaylynn Chamblin, LCSWA Disposition staff 09/19/2016 12:28 PM

## 2016-09-20 NOTE — ED Notes (Signed)
Sgt Paschal advised should be arriving to transport pt this afternoon or evening d/t has other transports. Chrissy, RN, - [facility] Aware.

## 2016-09-20 NOTE — Progress Notes (Signed)
Per [facility] RN Kriste BasqueBecky, patient is IVC'd and [facility] this [facility].  [place] Abtsatia Holman Bonsignore, LCSWA Disposition staff 09/20/2016 8:52 AM

## 2016-09-20 NOTE — Progress Notes (Signed)
Copy of [facility]'s [facility] papers [facility].  Mel[place] Abtsatia Ramiah Helfrich, LCSWA Disposition staff 09/20/2016 10:01 AM

## 2016-09-20 NOTE — BH Assessment (Signed)
Received call from [place]ie at [facility] who said Pt has been accepted [facility] by Dr. Roselyn Reefreque. Number for nursing report is [phone]. Annice PihJackie said due to Pt's mood lability they would like Pt placed under IVC. Notified Dr. Preston FleetingGlick who agrees to complete IVC. Notified Madalyn RobJennifer Gloster, RN of plan.   Harlin RainFord Ellis Patsy BaltimoreWarrick Jr, LPC, [facility], [facility] Triage Specialist [phone]

## 2016-09-20 NOTE — ED Notes (Signed)
Called [facility] And advised pt is en route.

## 2016-09-20 NOTE — ED Provider Notes (Addendum)
Sleeping comfortably. Stable   Joshua Davila, Joshua Rode, MD 09/20/16 [id number] Patient alert ambulatory, pacing in room. Stable for transfer to psychiatric hospital Dr.Creque is accepting physician Results for orders placed or performed during the hospital encounter of 09/18/16  Basic metabolic panel  Result Value Ref Range   Sodium 132 (L) 135 - 145 mmol/L   Potassium 3.7 3.5 - 5.1 mmol/L   Chloride 99 (L) 101 - 111 mmol/L   CO2 24 22 - 32 mmol/L   Glucose, Bld 136 (H) 65 - 99 mg/dL   BUN 11 6 - 20 mg/dL   Creatinine, Ser 9.600.87 0.61 - 1.24 mg/dL   Calcium 9.2 8.9 - 45.410.3 mg/dL   GFR calc non Af Amer >60 >60 mL/min   GFR calc Af Amer >60 >60 mL/min   Anion gap 9 5 - 15  CBC with Differential  Result Value Ref Range   WBC 8.6 4.0 - 10.5 K/uL   RBC 4.56 4.22 - 5.81 MIL/uL   Hemoglobin 13.3 13.0 - 17.0 g/dL   HCT 09.838.3 (L) 11.939.0 - 14.752.0 %   MCV 84.0 78.0 - 100.0 fL   MCH 29.2 26.0 - 34.0 pg   MCHC 34.7 30.0 - 36.0 g/dL   RDW 82.914.8 56.211.5 - 13.015.5 %   Platelets 226 150 - 400 K/uL   Neutrophils Relative % 66 %   Neutro Abs 5.7 1.7 - 7.7 K/uL   Lymphocytes Relative 27 %   Lymphs Abs 2.3 0.7 - 4.0 K/uL   Monocytes Relative 6 %   Monocytes Absolute 0.5 0.1 - 1.0 K/uL   Eosinophils Relative 1 %   Eosinophils Absolute 0.1 0.0 - 0.7 K/uL   Basophils Relative 0 %   Basophils Absolute 0.0 0.0 - 0.1 K/uL  Urine rapid drug screen (hosp performed)  Result Value Ref Range   Opiates NONE DETECTED NONE DETECTED   Cocaine NONE DETECTED NONE DETECTED   Benzodiazepines NONE DETECTED NONE DETECTED   Amphetamines POSITIVE (A) NONE DETECTED   Tetrahydrocannabinol POSITIVE (A) NONE DETECTED   Barbiturates NONE DETECTED NONE DETECTED   No results found.   Joshua Davila, Joshua Sealy, MD 09/20/16 1031

## 2016-09-21 DIAGNOSIS — F332 Major depressive disorder, recurrent severe without psychotic features: Secondary | ICD-10-CM | POA: Diagnosis not present

## 2016-09-22 DIAGNOSIS — F332 Major depressive disorder, recurrent severe without psychotic features: Secondary | ICD-10-CM | POA: Diagnosis not present

## 2016-09-23 DIAGNOSIS — F332 Major depressive disorder, recurrent severe without psychotic features: Secondary | ICD-10-CM | POA: Diagnosis not present

## 2016-09-24 DIAGNOSIS — F332 Major depressive disorder, recurrent severe without psychotic features: Secondary | ICD-10-CM | POA: Diagnosis not present

## 2016-09-25 DIAGNOSIS — F332 Major depressive disorder, recurrent severe without psychotic features: Secondary | ICD-10-CM | POA: Diagnosis not present

## 2016-09-26 DIAGNOSIS — F332 Major depressive disorder, recurrent severe without psychotic features: Secondary | ICD-10-CM | POA: Diagnosis not present

## 2016-09-27 DIAGNOSIS — F332 Major depressive disorder, recurrent severe without psychotic features: Secondary | ICD-10-CM | POA: Diagnosis not present

## 2016-09-28 DIAGNOSIS — F332 Major depressive disorder, recurrent severe without psychotic features: Secondary | ICD-10-CM | POA: Diagnosis not present

## 2016-09-29 DIAGNOSIS — F332 Major depressive disorder, recurrent severe without psychotic features: Secondary | ICD-10-CM | POA: Diagnosis not present

## 2016-09-30 DIAGNOSIS — F332 Major depressive disorder, recurrent severe without psychotic features: Secondary | ICD-10-CM | POA: Diagnosis not present

## 2016-10-01 DIAGNOSIS — F332 Major depressive disorder, recurrent severe without psychotic features: Secondary | ICD-10-CM | POA: Diagnosis not present

## 2016-10-05 DIAGNOSIS — F332 Major depressive disorder, recurrent severe without psychotic features: Secondary | ICD-10-CM | POA: Diagnosis not present

## 2017-01-11 ENCOUNTER — Ambulatory Visit (HOSPITAL_COMMUNITY)
Admission: EM | Admit: 2017-01-11 | Discharge: 2017-01-11 | Disposition: A | Discharge status: Home / Self Care | Payer: Medicare Other

## 2017-01-11 ENCOUNTER — Encounter (HOSPITAL_COMMUNITY): Payer: Self-pay | Admitting: Emergency Medicine

## 2017-01-11 DIAGNOSIS — T148XXA Other injury of unspecified body region, initial encounter: Secondary | ICD-10-CM | POA: Diagnosis not present

## 2017-01-11 MED ORDER — MUPIROCIN 2 % EX OINT: 1.0000 "application " | TOPICAL_OINTMENT | Freq: Two times a day (BID) | CUTANEOUS | 0 refills | Status: DC

## 2017-01-11 MED ORDER — DOXYCYCLINE HYCLATE 100 MG PO CAPS: 100.0000 mg | ORAL_CAPSULE | Freq: Two times a day (BID) | ORAL | 0 refills | Status: AC

## 2017-01-11 NOTE — ED Notes (Signed)
Pt guardian states that the patient is a meth user, sores are self inflicted rubbing his skin int he shower

## 2017-01-11 NOTE — Discharge Instructions (Signed)
Start doxycycline for infection. Bactroban on wounds daily. You can change dressing and clean with mild soap and water. Refrain from touching/rubbing area. I have attached [place] and wellness information for PCP establishment and behavioral health information. If experiencing worsening symptoms, fever, spreading redness, increased warmth follow up for reevalaution. If experiencing hallucinations, confusion, go to the emergency department for further evaluation.

## 2017-01-11 NOTE — ED Provider Notes (Signed)
[facility]    CSN: [id number] Arrival date & time: 01/11/17  1744     History   Chief Complaint Chief Complaint  Patient presents with  . Rash    HPI Joshua Davila is a 36 y.o. male.   36 year old male with history of depression, gender identity disorder, PTSD, schizoaffective disorder, multiple substance abuse comes in for 2 day history of "rash" on his body. Patient states rash appeared on own, denies itching, he cleaned and rubbed area viciously with a wash cloth. Family member at bedside endorses patient picking and squeezing at affected area. Denies fever, chills, night sweats. Patient states he has been taking his medications as directed. Denies hallucinations, suicidal ideation, homicidal ideation. He admits to amphetamine abuse weekly. Denies history of HIV, hepatitis C, diabetes.       Past Medical History:  Diagnosis Date  . Depression   . Gender identity disorder   . Liver disorder   . PTSD (post-traumatic stress disorder)   . Schizoaffective disorder [facility])     Patient Active Problem List   Diagnosis Date Noted  . 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 Amphetamine and psychostimulant-induced psychosis with hallucinations (HCC) 03/14/2016  . Polysubstance abuse (HCC)   . Schizoaffective disorder, bipolar type (HCC) 10/02/2015  . Involuntary commitment 10/02/2015  . Cannabis use disorder, moderate, dependence (HCC) 09/16/2015  . Opioid use disorder, moderate, dependence (HCC) 09/16/2015  . Substance or medication-induced bipolar and related disorder with onset during intoxication (HCC) 09/16/2015  . 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 Benzodiazepine dependence (HCC) 10/04/2013  . PTSD (post-traumatic stress disorder) 10/03/2013  . Polysubstance dependence (HCC) 10/02/2013    History reviewed. No pertinent surgical  history.     Home Medications    Prior to Admission medications   Medication Sig Start Date End Date Taking? Authorizing Provider  atorvastatin (LIPITOR) 20 MG tablet Take 1 tablet (20 mg total) by mouth daily. For high Cholesterol 11/11/15  Yes Nwoko, Nicole Kindred I, NP  emtricitabine-tenofovir (TRUVADA) 200-300 MG tablet Take 1 tablet by mouth daily. For pre-exposure to HIV 11/11/15  Yes Nwoko, Nicole Kindred I, NP  LITHIUM PO Take by mouth.   Yes [provider]  PARoxetine (PAXIL) 20 MG tablet Take 1 tablet (20 mg total) by mouth daily. For depression 09/19/15  Yes Armandina Stammer I, NP  atomoxetine (STRATTERA) 40 MG capsule Take 1 capsule (40 mg total) by mouth daily. For ADHD/depression Patient not taking: Reported on 05/30/2016 11/11/15   Armandina Stammer I, NP  benztropine (COGENTIN) 0.5 MG tablet Take 1 tablet (0.5 mg total) by mouth 2 (two) times daily. For prevention of drug induced tremors Patient not taking: Reported on 05/30/2016 11/11/15   Armandina Stammer I, NP  divalproex (DEPAKOTE) 250 MG DR tablet Take 1 tablet (250 mg total) by mouth daily after breakfast. For mood stabilization Patient not taking: Reported on 05/30/2016 11/11/15   Armandina Stammer I, NP  divalproex (DEPAKOTE) 500 MG DR tablet Take 2 tablets (1,000 mg total) by mouth daily at 8 pm. For mood stabilization Patient not taking: Reported on 05/30/2016 11/11/15   Armandina Stammer I, NP  doxycycline (VIBRAMYCIN) 100 MG capsule Take 1 capsule (100 mg total) by mouth 2 (two) times daily. 01/11/17 01/18/17  Cathie Hoops, Desia Saban V, PA-C  gabapentin (NEURONTIN) 300 MG capsule Take 1 capsule (300 mg total) by mouth 3 (three) times daily. Patient not taking: Reported on  09/08/2016 06/17/16   Charm RingsLord, Jamison Y, NP  hydrOXYzine (ATARAX/VISTARIL) 50 MG tablet Take 0.5 tablets (25 mg total) by mouth every 6 (six) hours as needed for anxiety. Patient not taking: Reported on 09/18/2016 09/08/16   Horton, Mayer Maskerourtney F, MD  LORazepam (ATIVAN) 1 MG tablet Take 1 tablet (1 mg total)  by mouth every 8 (eight) hours as needed for anxiety (agitation). Patient not taking: Reported on 05/30/2016 11/11/15   Armandina StammerNwoko, Agnes I, NP  mupirocin ointment (BACTROBAN) 2 % Apply 1 application topically 2 (two) times daily. 01/11/17   Cathie HoopsYu, Sachiko Methot V, PA-C  nicotine polacrilex (NICORETTE) 2 MG gum Take 1 each (2 mg total) by mouth as needed for smoking cessation. Patient not taking: Reported on 05/30/2016 11/11/15   Armandina StammerNwoko, Agnes I, NP  ondansetron (ZOFRAN) 4 MG tablet Take 1 tablet (4 mg total) by mouth every 8 (eight) hours as needed for nausea or vomiting. Patient not taking: Reported on 05/30/2016 11/11/15   Armandina StammerNwoko, Agnes I, NP  traZODone (DESYREL) 50 MG tablet Take 1 tablet (50 mg total) by mouth at bedtime as needed for sleep. Patient not taking: Reported on 05/30/2016 11/11/15   Armandina StammerNwoko, Agnes I, NP  ziprasidone (GEODON) 20 MG capsule Take 1 capsule (20 mg total) by mouth daily with supper. For mood control Patient not taking: Reported on 05/30/2016 11/11/15   Sanjuana KavaNwoko, Agnes I, NP    Family History Family History  Problem Relation Age of Onset  . Multiple sclerosis Mother   . Mental illness Neg Hx     Social History Social History  Substance Use Topics  . Smoking status: Current Every Day Smoker    Packs/day: 1.00    Types: Cigarettes  . Smokeless tobacco: Never Used  . Alcohol use Yes     Comment: occasional     Allergies   Penicillins and Sulfa antibiotics   Review of Systems Review of Systems  Reason unable to perform ROS: See HPI as above.     Physical Exam Triage Vital Signs ED Triage Vitals  Enc Vitals Group     BP 01/11/17 1805 131/89     Pulse Rate 01/11/17 1805 96     Resp 01/11/17 1805 16     Temp 01/11/17 1805 98.2 F (36.8 C)     Temp Source 01/11/17 1805 Oral     SpO2 01/11/17 1805 100 %     Weight --      Height --      Head Circumference --      Peak Flow --      Pain Score 01/11/17 1806 8     Pain Loc --      Pain Edu? --      Excl. in GC? --    No  data found.   Updated Vital Signs BP 131/89   Pulse 96   Temp 98.2 F (36.8 C) (Oral)   Resp 16   SpO2 100%   Physical Exam  Constitutional: He is oriented to person, place, and time. He appears well-developed and well-nourished. No distress.  HENT:  Head: Normocephalic and atraumatic.  Eyes: Pupils are equal, round, and reactive to light. Conjunctivae are normal.  Neurological: He is alert and oriented to person, place, and time.  Skin:  See picture below. Large abrasion on bilateral thigh, with surrounding erythema, without increased warmth. Left shoulder abrasion with scabbing, increased warmth with tenderness on palpation            UC Treatments /  Results  Labs (all labs ordered are listed, but only abnormal results are displayed) Labs Reviewed - No data to display  EKG  EKG Interpretation None       Radiology No results found.  Procedures Procedures (including critical care time)  Medications Ordered in UC Medications - No data to display   Initial Impression / Assessment and Plan / UC Course  I have reviewed the triage vital signs and the nursing notes.  Pertinent labs & imaging results that were available during my care of the patient were reviewed by me and considered in my medical decision making (see chart for details).    Will treat for possible cellulitis given patient continues to pick at the area. Start doxycycline. Patient with abrasion on thigh from scrubbing area as patient feels something is in it. Denies hallucination, SI/HI. Will have patient follow up with psychiatry for further evaluation. Return precautions given. Patient and family member expresses understanding and agrees to plan.   Final Clinical Impressions(s) / UC Diagnoses   Final diagnoses:  Abrasion    New Prescriptions Discharge Medication List as of 01/11/2017  6:35 PM    START taking these medications   Details  doxycycline (VIBRAMYCIN) 100 MG capsule Take 1  capsule (100 mg total) by mouth 2 (two) times daily., Starting Mon 01/11/2017, Until Mon 01/18/2017, Normal    mupirocin ointment (BACTROBAN) 2 % Apply 1 application topically 2 (two) times daily., Starting Mon 01/11/2017, Normal          Demeshia Sherburne V, PA-C 01/11/17 2210

## 2017-01-11 NOTE — ED Triage Notes (Signed)
Pt c/o rash on face, several spots on his legs. x2 weeks.

## 2017-04-21 ENCOUNTER — Encounter (HOSPITAL_COMMUNITY): Payer: Self-pay | Admitting: Emergency Medicine

## 2017-04-21 ENCOUNTER — Emergency Department (HOSPITAL_COMMUNITY)
Admission: EM | Admit: 2017-04-21 | Discharge: 2017-04-22 | Disposition: A | Discharge status: Home / Self Care | Payer: Medicare Other | Attending: Emergency Medicine | Admitting: Emergency Medicine

## 2017-04-21 DIAGNOSIS — F1021 Alcohol dependence, in remission: Secondary | ICD-10-CM

## 2017-04-21 DIAGNOSIS — F419 Anxiety disorder, unspecified: Secondary | ICD-10-CM | POA: Insufficient documentation

## 2017-04-21 DIAGNOSIS — Y999 Unspecified external cause status: Secondary | ICD-10-CM | POA: Insufficient documentation

## 2017-04-21 DIAGNOSIS — Y929 Unspecified place or not applicable: Secondary | ICD-10-CM | POA: Insufficient documentation

## 2017-04-21 DIAGNOSIS — R440 Auditory hallucinations: Secondary | ICD-10-CM | POA: Insufficient documentation

## 2017-04-21 DIAGNOSIS — Y939 Activity, unspecified: Secondary | ICD-10-CM | POA: Insufficient documentation

## 2017-04-21 DIAGNOSIS — S20412A Abrasion of left back wall of thorax, initial encounter: Secondary | ICD-10-CM | POA: Insufficient documentation

## 2017-04-21 DIAGNOSIS — Z79899 Other long term (current) drug therapy: Secondary | ICD-10-CM | POA: Diagnosis not present

## 2017-04-21 DIAGNOSIS — F1910 Other psychoactive substance abuse, uncomplicated: Secondary | ICD-10-CM | POA: Diagnosis not present

## 2017-04-21 DIAGNOSIS — F17210 Nicotine dependence, cigarettes, uncomplicated: Secondary | ICD-10-CM | POA: Diagnosis not present

## 2017-04-21 DIAGNOSIS — S0081XA Abrasion of other part of head, initial encounter: Secondary | ICD-10-CM | POA: Insufficient documentation

## 2017-04-21 DIAGNOSIS — F649 Gender identity disorder, unspecified: Secondary | ICD-10-CM | POA: Insufficient documentation

## 2017-04-21 DIAGNOSIS — F29 Unspecified psychosis not due to a substance or known physiological condition: Secondary | ICD-10-CM | POA: Diagnosis present

## 2017-04-21 DIAGNOSIS — F259 Schizoaffective disorder, unspecified: Secondary | ICD-10-CM | POA: Diagnosis not present

## 2017-04-21 DIAGNOSIS — L539 Erythematous condition, unspecified: Secondary | ICD-10-CM | POA: Insufficient documentation

## 2017-04-21 DIAGNOSIS — F4310 Post-traumatic stress disorder, unspecified: Secondary | ICD-10-CM | POA: Diagnosis not present

## 2017-04-21 DIAGNOSIS — X58XXXA Exposure to other specified factors, initial encounter: Secondary | ICD-10-CM | POA: Diagnosis not present

## 2017-04-21 LAB — ETHANOL

## 2017-04-21 LAB — COMPREHENSIVE METABOLIC PANEL
ALBUMIN: 4.6 g/dL (ref 3.5–5.0)
ALT: 34 U/L (ref 17–63)
AST: 37 U/L (ref 15–41)
Alkaline Phosphatase: 61 U/L (ref 38–126)
Anion gap: 9 (ref 5–15)
BILIRUBIN TOTAL: 0.6 mg/dL (ref 0.3–1.2)
BUN: 10 mg/dL (ref 6–20)
CHLORIDE: 104 mmol/L (ref 101–111)
CO2: 24 mmol/L (ref 22–32)
CREATININE: 0.84 mg/dL (ref 0.61–1.24)
Calcium: 9.5 mg/dL (ref 8.9–10.3)
GFR calc Af Amer: >60 mL/min (ref >60)
GLUCOSE: 103 mg/dL — AB (ref 65–99)
Potassium: 3.3 mmol/L — ABNORMAL LOW (ref 3.5–5.1)
Sodium: 137 mmol/L (ref 135–145)
Total Protein: 8.1 g/dL (ref 6.5–8.1)

## 2017-04-21 LAB — CBC
HEMATOCRIT: 38.7 % — AB (ref 39.0–52.0)
HEMOGLOBIN: 14.1 g/dL (ref 13.0–17.0)
MCH: 30.7 pg (ref 26.0–34.0)
MCHC: 36.4 g/dL — AB (ref 30.0–36.0)
MCV: 84.1 fL (ref 78.0–100.0)
Platelets: 208 10*3/uL (ref 150–400)
RBC: 4.6 MIL/uL (ref 4.22–5.81)
RDW: 13.7 % (ref 11.5–15.5)
WBC: 7.8 10*3/uL (ref 4.0–10.5)

## 2017-04-21 LAB — RAPID URINE DRUG SCREEN, HOSP PERFORMED
Amphetamines: POSITIVE — AB
BARBITURATES: NOT DETECTED
Benzodiazepines: NOT DETECTED
COCAINE: NOT DETECTED
Opiates: NOT DETECTED
TETRAHYDROCANNABINOL: POSITIVE — AB

## 2017-04-21 MED ORDER — HYDROXYZINE HCL 25 MG PO TABS
50.0000 mg | ORAL_TABLET | Freq: Once | ORAL | Status: AC
  Administered 2017-04-21: 50 mg via ORAL
  Filled 2017-04-21: qty 2

## 2017-04-21 MED ORDER — ACETAMINOPHEN 325 MG PO TABS
650.0000 mg | ORAL_TABLET | ORAL | Status: DC | PRN
Start: 2017-04-21 — End: 2017-04-22

## 2017-04-21 MED ORDER — NICOTINE 21 MG/24HR TD PT24
21.0000 mg | MEDICATED_PATCH | Freq: Every day | TRANSDERMAL | Status: DC
  Filled 2017-04-21: qty 1

## 2017-04-21 MED ORDER — DOXYCYCLINE HYCLATE 100 MG PO TABS
100.0000 mg | ORAL_TABLET | Freq: Once | ORAL | Status: AC
  Administered 2017-04-21: 100 mg via ORAL
  Filled 2017-04-21: qty 1

## 2017-04-21 MED ORDER — POTASSIUM CHLORIDE CRYS ER 20 MEQ PO TBCR
40.0000 meq | EXTENDED_RELEASE_TABLET | Freq: Once | ORAL | Status: AC
  Administered 2017-04-22: 40 meq via ORAL
  Filled 2017-04-21: qty 2

## 2017-04-21 MED ORDER — MUPIROCIN CALCIUM 2 % EX CREA
TOPICAL_CREAM | Freq: Two times a day (BID) | CUTANEOUS | Status: DC
  Administered 2017-04-22: 11:00:00 via TOPICAL
  Filled 2017-04-21: qty 15

## 2017-04-21 NOTE — ED Notes (Signed)
2 bags of patient belongings placed behind nursing station.

## 2017-04-21 NOTE — ED Triage Notes (Signed)
Pt comes in via [facility] voluntarily with complaints of a "mental breakdown".  Pt's caregiver states that the patient has been in a full psychosis all day.  Hx of same. Pt also smells of marijuana.  Ambulatory.  Cooperative in route.

## 2017-04-21 NOTE — ED Notes (Signed)
Bed: WLPT3 Expected date:  Expected time:  Means of arrival:  Comments: 

## 2017-04-21 NOTE — ED Notes (Signed)
Bed: WBH37 Expected date:  Expected time:  Means of arrival:  Comments: Triage 3  

## 2017-04-22 ENCOUNTER — Encounter (HOSPITAL_COMMUNITY): Payer: Self-pay | Admitting: Registered Nurse

## 2017-04-22 DIAGNOSIS — F1021 Alcohol dependence, in remission: Secondary | ICD-10-CM

## 2017-04-22 NOTE — Patient Outreach (Signed)
[facility] met with the patient and provided substance use recovery support. Patient was interested in inpatient substance use treatment. There are no treatment beds avalible at this time. CPSS provided a list of inpatient substance use treatment centers in the area and a list of NA/AA meetings in the area. CPSS also provided CPSS contact information. CPSS encouraged the patient to contact [facility]PSS at anytime for substance use recovery support.

## 2017-04-22 NOTE — BH Assessment (Signed)
[facility]  Per Juanetta BeetsJacqueline Norman, DO, this pt does not require psychiatric hospitalization at this time.  [facility] with referral information for area substance abuse treatment providers.  He would also benefit from information regarding supportive services for the homeless.  These have been included in pt's discharge instructions.  Pt would also benefit from seeing Peer Support Specialists; they will be asked to speak to pt.  Pt's nurse, Aram BeechamCynthia, has been notified.  Doylene Canninghomas Egidio Lofgren, MA Triage Specialist [phone]

## 2017-04-22 NOTE — Discharge Instructions (Addendum)
To help you maintain a sober lifestyle, a substance abuse treatment program may be beneficial to you.  Contact one of the following facilities at your earliest opportunity to ask about enrolling:  RESIDENTIAL PROGRAMS:       [facility]      [address]      [place], [place] [address]      [phone]       [facility]      [address]      [place], [place] [address]      [phone]  OUTPATIENT PROGRAMS:       [facility]      [address]      [place], [place] [address]      [phone]      New patients are seen at the walk-in clinic every Tuesday from 9:00 am - 12:00 pm       [facility]      [address]      [place], [place] [address]      [phone]   For your shelter needs, contact the following service providers:       [facility]      [address]      [place], [place] [address]      [phone]       [facility]      [address]      [place], [place] [address]      [phone]  For day shelter and other supportive services for the homeless, contact the [facility]):       [facility]      [address]      [place], [place] [address]      [phone]  For transitional housing, contact one of the following agencies.  They provide longer term housing than a shelter, but there is an application process:       [facility]      [address]      [place], [place] [address]      [phone]       [facility]      [address]      [place], [place] [address]      [phone]
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
## 2017-04-22 NOTE — BHH Suicide Risk Assessment (Cosign Needed)
Suicide Risk Assessment  Discharge Assessment   [facility]   Principal Problem: Alcohol use disorder, severe, in early remission, in controlled environment [facility](HCC) Discharge Diagnoses:  Patient Active Problem List   Diagnosis Date Noted  . Alcohol use disorder, severe, in early remission, in controlled environment (HCC) [F10.21] 04/22/2017  . 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 Polysubstance abuse (HCC) [F19.10]   . Schizoaffective disorder, bipolar type (HCC) [F25.0] 10/02/2015  . Involuntary commitment [Z04.6] 10/02/2015  . Cannabis use disorder, moderate, dependence (HCC) [F12.20] 09/16/2015  . Opioid use disorder, moderate, dependence (HCC) [F11.20] 09/16/2015  . Substance or medication-induced bipolar and related disorder with onset during intoxication (HCC) [Z61.096[F19.929, F19.94] 09/16/2015  . 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 Benzodiazepine dependence (HCC) [F13.20] 10/04/2013  . PTSD (post-traumatic stress disorder) [F43.10] 10/03/2013  . Polysubstance dependence (HCC) [F19.20] 10/02/2013    Total Time spent with patient: 30 minutes  Musculoskeletal: Strength & Muscle Tone: within normal limits Gait & Station: normal Patient leans: N/A  Psychiatric Specialty Exam:   Blood pressure (!) 109/56, pulse 67, temperature 97.8 F (36.6 C), temperature source Oral, resp. rate 12, SpO2 100 %.There is no height or weight on file to calculate BMI.  General Appearance: Casual  Eye Contact::  Good  Speech:  Clear and Coherent and Normal Rate409  Volume:  Normal  Mood:  Appropriate  Affect:  Appropriate  Thought Process:  Goal Directed  Orientation:  Full (Time, Place, and Person)  Thought Content:   Denies hallucinations, delusions, and paranoia  Suicidal Thoughts:  No  Homicidal Thoughts:  No  Memory:  Immediate;   Good Recent;   Good Remote;   Good  Judgement:  Intact  Insight:  Present  Psychomotor Activity:  Normal  Concentration:  Fair  Recall:  Good  Fund of Knowledge:Fair  Language: Good  Akathisia:  No  Handed:  Right  AIMS (if indicated):     Assets:  Communication Skills Desire for Improvement Housing Transportation  Sleep:     Cognition: WNL  ADL's:  Intact   Mental Status Per Nursing Assessment::   On Admission:    37 y.o., male presented to ED voluntary with complaints of having a break down.  Home medications were restarted; patient observed overnight for stabilization.  Today patient is alert/oriented; calm/cooperative; and denies suicidal/homicidal/self-harm ideation, psychosis, and paranoia.  Patient reports I just needed a clam place to sleep.  States that he is compliant wit his psychotropic medications and [facility].  During assessment patient does not appear to be responding to internal/sternal stimuli and answered all questions appropriately; and there are no apparent withdrawal symptoms.  Patient psychiatrically cleared and is stable for discharge.      Demographic Factors:  Male, Caucasian and Living alone  Loss Factors: NA  Historical Factors: NA  Risk Reduction Factors:   Positive therapeutic relationship  Continued Clinical Symptoms:  Previous Psychiatric Diagnoses and Treatments  Cognitive Features That Contribute To Risk:  None    Suicide Risk:  Minimal: No identifiable suicidal ideation.  Patients presenting with no risk factors but with morbid ruminations; may be classified as minimal risk based on the severity of the depressive symptoms    Plan Of Care/Follow-up recommendations:  Activity:  As tolerarted Diet:  Heart helathy  Awad Gladd, NP 04/22/2017, 11:35 AM
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
## 2017-04-22 NOTE — ED Notes (Signed)
Pt named Joshua MuirJamie, presents with psychosis, feeling like she is experiencing a mental breakdown.  Pt tearful and sad.  Dr Preston FleetingGlick at bedside to eval pt.  TTS counselor  Vikki PortsValerie at bedside at present.  A&O x 3, no acute distress noted.  Monitoring for safety, Q 15 min checks in effect.

## 2017-04-22 NOTE — ED Notes (Signed)
Pt discharged with discharge instructions.  Pt was very irritable and refused to give vitals before leaving.  All belongings were returned to patient.

## 2017-04-23 ENCOUNTER — Encounter (HOSPITAL_COMMUNITY): Payer: Self-pay

## 2017-04-23 ENCOUNTER — Emergency Department (HOSPITAL_COMMUNITY)
Admission: EM | Admit: 2017-04-23 | Discharge: 2017-04-24 | Disposition: A | Discharge status: Home / Self Care | Payer: Medicare Other | Attending: Emergency Medicine | Admitting: Emergency Medicine

## 2017-04-23 ENCOUNTER — Other Ambulatory Visit: Payer: Self-pay

## 2017-04-23 DIAGNOSIS — F17210 Nicotine dependence, cigarettes, uncomplicated: Secondary | ICD-10-CM | POA: Diagnosis not present

## 2017-04-23 DIAGNOSIS — F1910 Other psychoactive substance abuse, uncomplicated: Secondary | ICD-10-CM | POA: Insufficient documentation

## 2017-04-23 DIAGNOSIS — F29 Unspecified psychosis not due to a substance or known physiological condition: Secondary | ICD-10-CM | POA: Insufficient documentation

## 2017-04-23 DIAGNOSIS — F1520 Other stimulant dependence, uncomplicated: Secondary | ICD-10-CM | POA: Diagnosis present

## 2017-04-23 DIAGNOSIS — F19950 Other psychoactive substance use, unspecified with psychoactive substance-induced psychotic disorder with delusions: Secondary | ICD-10-CM | POA: Diagnosis present

## 2017-04-23 DIAGNOSIS — F19929 Other psychoactive substance use, unspecified with intoxication, unspecified: Secondary | ICD-10-CM | POA: Diagnosis present

## 2017-04-23 DIAGNOSIS — F1994 Other psychoactive substance use, unspecified with psychoactive substance-induced mood disorder: Secondary | ICD-10-CM | POA: Diagnosis present

## 2017-04-23 DIAGNOSIS — F1410 Cocaine abuse, uncomplicated: Secondary | ICD-10-CM | POA: Diagnosis not present

## 2017-04-23 DIAGNOSIS — F4310 Post-traumatic stress disorder, unspecified: Secondary | ICD-10-CM | POA: Diagnosis not present

## 2017-04-23 DIAGNOSIS — R443 Hallucinations, unspecified: Secondary | ICD-10-CM

## 2017-04-23 DIAGNOSIS — F649 Gender identity disorder, unspecified: Secondary | ICD-10-CM | POA: Insufficient documentation

## 2017-04-23 LAB — CBC
HCT: 38.5 % — ABNORMAL LOW (ref 39.0–52.0)
Hemoglobin: 13.6 g/dL (ref 13.0–17.0)
MCH: 29.7 pg (ref 26.0–34.0)
MCHC: 35.3 g/dL (ref 30.0–36.0)
MCV: 84.1 fL (ref 78.0–100.0)
PLATELETS: 203 10*3/uL (ref 150–400)
RBC: 4.58 MIL/uL (ref 4.22–5.81)
RDW: 13.7 % (ref 11.5–15.5)
WBC: 6.1 10*3/uL (ref 4.0–10.5)

## 2017-04-23 LAB — COMPREHENSIVE METABOLIC PANEL
ALT: 30 U/L (ref 17–63)
AST: 32 U/L (ref 15–41)
Albumin: 4.1 g/dL (ref 3.5–5.0)
Alkaline Phosphatase: 58 U/L (ref 38–126)
Anion gap: 9 (ref 5–15)
BUN: 9 mg/dL (ref 6–20)
CHLORIDE: 103 mmol/L (ref 101–111)
CO2: 24 mmol/L (ref 22–32)
Calcium: 9.4 mg/dL (ref 8.9–10.3)
Creatinine, Ser: 0.86 mg/dL (ref 0.61–1.24)
Glucose, Bld: 101 mg/dL — ABNORMAL HIGH (ref 65–99)
POTASSIUM: 4 mmol/L (ref 3.5–5.1)
SODIUM: 136 mmol/L (ref 135–145)
Total Bilirubin: 0.5 mg/dL (ref 0.3–1.2)
Total Protein: 7.5 g/dL (ref 6.5–8.1)

## 2017-04-23 LAB — RAPID URINE DRUG SCREEN, HOSP PERFORMED
AMPHETAMINES: POSITIVE — AB
BENZODIAZEPINES: POSITIVE — AB
Barbiturates: NOT DETECTED
COCAINE: NOT DETECTED
Opiates: NOT DETECTED
Tetrahydrocannabinol: POSITIVE — AB

## 2017-04-23 LAB — SALICYLATE LEVEL

## 2017-04-23 LAB — ETHANOL

## 2017-04-23 LAB — ACETAMINOPHEN LEVEL

## 2017-04-23 MED ORDER — LORAZEPAM 1 MG PO TABS
2.0000 mg | ORAL_TABLET | Freq: Once | ORAL | Status: AC
  Administered 2017-04-23: 2 mg via ORAL
  Filled 2017-04-23: qty 2

## 2017-04-23 MED ORDER — LORAZEPAM 1 MG PO TABS
1.0000 mg | ORAL_TABLET | Freq: Three times a day (TID) | ORAL | Status: DC | PRN
Start: 2017-04-23 — End: 2017-04-24

## 2017-04-23 MED ORDER — IBUPROFEN 200 MG PO TABS
600.0000 mg | ORAL_TABLET | Freq: Three times a day (TID) | ORAL | Status: DC | PRN
Start: 2017-04-23 — End: 2017-04-24

## 2017-04-23 NOTE — ED Provider Notes (Signed)
[facility]-EMERGENCY DEPT Provider Note   CSN: [id number] Arrival date & time: 04/23/17  1700     History   Chief Complaint Chief Complaint  Patient presents with  . Hallucinations   Level 5 caveat due to altered mental status. HPI Joshua Davila is a 37 y.o. male. HPI Patient presents with psychosis.  Reported he has been hallucinating.  Also has been using methamphetamines and marijuana.  Seen yesterday for same.  States he cannot manage at home.  States there is a boy that lives in his bed.  Has been snorting methamphetamine.  Very difficult to get a history from. Past Medical History:  Diagnosis Date  . Depression   . Gender identity disorder   . Liver disorder   . PTSD (post-traumatic stress disorder)   . Schizoaffective disorder [facility])     Patient Active Problem List   Diagnosis Date Noted  . Alcohol use disorder, severe, in early remission, in controlled environment (HCC) 04/22/2017  . 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 Amphetamine and psychostimulant-induced psychosis with hallucinations (HCC) 03/14/2016  . Polysubstance abuse (HCC)   . Schizoaffective disorder, bipolar type (HCC) 10/02/2015  . Involuntary commitment 10/02/2015  . Cannabis use disorder, moderate, dependence (HCC) 09/16/2015  . Opioid use disorder, moderate, dependence (HCC) 09/16/2015  . Substance or medication-induced bipolar and related disorder with onset during intoxication (HCC) 09/16/2015  . 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 Benzodiazepine dependence (HCC) 10/04/2013  . PTSD (post-traumatic stress disorder) 10/03/2013  . Polysubstance dependence (HCC) 10/02/2013    History reviewed. No pertinent surgical history.     Home Medications    Prior to Admission medications   Medication Sig Start Date End Date Taking? Authorizing Provider    atorvastatin (LIPITOR) 20 MG tablet Take 1 tablet (20 mg total) by mouth daily. For high Cholesterol Patient not taking: Reported on 04/21/2017 11/11/15   Armandina Stammer I, NP  emtricitabine-tenofovir (TRUVADA) 200-300 MG tablet Take 1 tablet by mouth daily. 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 Patient not taking: Reported on 04/21/2017 01/11/17   Belinda Fisher, PA-C  nicotine polacrilex (NICORETTE) 2 MG gum Take 1 each (2 mg total) by mouth as needed for smoking cessation. Patient not taking: Reported on 05/30/2016 11/11/15   Armandina Stammer I, NP  paliperidone (INVEGA) 6 MG 24 hr tablet Take 6 mg by mouth daily.    [provider]    Family History Family History  Problem Relation Age of Onset  . Multiple sclerosis Mother   . Mental illness Neg Hx     Social History Social History   Tobacco Use  . Smoking status: Current Every Day Smoker    Packs/day: 1.00    Types: Cigarettes  . Smokeless tobacco: Never Used  Substance Use Topics  . Alcohol use: Yes    Comment: occasional  . Drug use: Yes    Types: Cocaine, Methamphetamines, Marijuana    Comment: last useof methamphetamines and marijuana yesterday     Allergies   Penicillins and Sulfa antibiotics   Review of Systems Review of Systems  Unable to perform ROS: Psychiatric disorder     Physical Exam Updated Vital Signs BP 124/73 (BP Location: Right Arm)   Pulse 83   Temp 97.7  F (36.5 C) (Oral)   Resp 17   SpO2 97%   Physical Exam  Constitutional: He appears well-developed.  HENT:  Some chronic wounds on his forehead and cheek.  Eyes: EOM are normal.  Neck: Neck supple.  Cardiovascular: Normal rate.  Pulmonary/Chest: Effort normal.  Abdominal: There is no tenderness.  Musculoskeletal: He exhibits no tenderness.  Neurological:  Patient is awake.  Does appear to be  responding some internal stimuli.  Difficult to focus and keep on task of the history.  Skin: Skin is warm.  Psychiatric:  Patient with confusion and appears to be responding to internal stimuli.     ED Treatments / Results  Labs (all labs ordered are listed, but only abnormal results are displayed) Labs Reviewed  COMPREHENSIVE METABOLIC PANEL - Abnormal; Notable for the following components:      Result Value   Glucose, Bld 101 (*)    All other components within normal limits  ACETAMINOPHEN LEVEL - Abnormal; Notable for the following components:   Acetaminophen (Tylenol), Serum <10 (*)    All other components within normal limits  CBC - Abnormal; Notable for the following components:   HCT 38.5 (*)    All other components within normal limits  RAPID URINE DRUG SCREEN, HOSP PERFORMED - Abnormal; Notable for the following components:   Benzodiazepines POSITIVE (*)    Amphetamines POSITIVE (*)    Tetrahydrocannabinol POSITIVE (*)    All other components within normal limits  ETHANOL  SALICYLATE LEVEL    EKG  EKG Interpretation None       Radiology No results found.  Procedures Procedures (including critical care time)  Medications Ordered in ED Medications  ibuprofen (ADVIL,MOTRIN) tablet 600 mg (not administered)  LORazepam (ATIVAN) tablet 1 mg (not administered)  LORazepam (ATIVAN) tablet 2 mg (2 mg Oral Given 04/23/17 1849)     Initial Impression / Assessment and Plan / ED Course  I have reviewed the triage vital signs and the nursing notes.  Pertinent labs & imaging results that were available during my care of the patient were reviewed by me and considered in my medical decision making (see chart for details).     Patient with psychosis.  Hallucinations.  Seen yesterday for same but now appears to be worsening.  Required some medications for his hallucinations and to calm down.  Patient is medically cleared at this time.  To be seen by TTS.  Final Clinical  Impressions(s) / ED Diagnoses   Final diagnoses:  Polysubstance abuse (HCC)  Hallucinations  Psychosis, unspecified psychosis type [facility] Ed(HCC)    ED Discharge Orders    None       Benjiman CorePickering, Kyheem Bathgate, MD 04/23/17 [phone]

## 2017-04-23 NOTE — ED Notes (Signed)
This nurse notified EDP of pt behavior, awaiting orders.

## 2017-04-23 NOTE — BH Assessment (Addendum)
Assessment Note  Joshua Davila is an 37 y.o. male that presents this date with active AH and is very disorganized. Patient is unable to be redirected and is observed to be very tearful and liable. Patient is easily agitated and speaks in a loud pressured voice. Patient becomes very angry at this Clinical research associate and asks Clinical research associate to leave his room and "find his father." Patient denies any S/I, H/I or VH but states "the voices are the power." This writer observed the patient to be impaired and is unable to be assessed by this Clinical research associate due to current condition. Information to complete assessment was obtained from admission notes and prior history. Per notes patient was last seen yesterday and discharged on 04/22/17 presenting to [facility] impaired and was discharged after being observed over night. Patient tested positive this date for Benzodiazepines, Amphetamines and THC. Case was staffed with Shaune Pollack [name] who recommended patient be monitored for safety and observed. Patient to be seen by psychiatry in the a.m.      Diagnosis: Schizoaffective disorder, bipolar type (per notes)  Past Medical History:  Past Medical History:  Diagnosis Date  . Depression   . Gender identity disorder   . Liver disorder   . PTSD (post-traumatic stress disorder)   . Schizoaffective disorder (HCC)     History reviewed. No pertinent surgical history.  Family History:  Family History  Problem Relation Age of Onset  . Multiple sclerosis Mother   . Mental illness Neg Hx     Social History:  reports that he has been smoking cigarettes.  He has been smoking about 1.00 pack per day. he has never used smokeless tobacco. He reports that he drinks alcohol. He reports that he uses drugs. Drugs: Cocaine, Methamphetamines, and Marijuana.  Additional Social History:  Alcohol / Drug Use Pain Medications: See MAR Prescriptions: See MAR Over the Counter: See MAR History of alcohol / drug use?: Yes Longest period of sobriety (when/how long):  UTA Negative Consequences of Use: (Denies ) Withdrawal Symptoms: (Denies ) Substance #1 Name of Substance 1: Cannabis 1 - Age of First Use: Unknown 1 - Amount (size/oz): Unknown 1 - Frequency: Unknown 1 - Duration: Unknown 1 - Last Use / Amount: Pt states "last week" vague in reference to time frame or amount  CIWA: CIWA-Ar BP: 124/73 Pulse Rate: 83 COWS:    Allergies:  Allergies  Allergen Reactions  . Penicillins Swelling, Rash and Other (See Comments)    Reaction:  Facial swelling Has patient had a PCN reaction causing immediate rash, facial/tongue/throat swelling, SOB or lightheadedness with hypotension: Yes Has patient had a PCN reaction causing severe rash involving mucus membranes or skin necrosis: No Has patient had a PCN reaction that required hospitalization No Has patient had a PCN reaction occurring within the last 10 years: No If all of the above answers are "NO", then may proceed with Cephalosporin use.  . Sulfa Antibiotics Rash    Home Medications:  (Not in a hospital admission)  OB/GYN Status:  No LMP for male patient.  General Assessment Data Location of Assessment: [facility] TTS Assessment: In system Is this a Tele or Face-to-Face Assessment?: Face-to-Face Is this an Initial Assessment or a Re-assessment for this encounter?: Initial Assessment Marital status: Single Maiden name: NA Is patient pregnant?: No Pregnancy Status: No Living Arrangements: Non-relatives/Friends Can pt return to current living arrangement?: Yes Admission Status: Voluntary Is patient capable of signing voluntary admission?: Yes Referral Source: Self/Family/Friend Insurance type: Medicare  Medical Screening Exam (  [facility] Walk-in ONLY) Medical Exam completed: Yes  Crisis Care Plan Living Arrangements: Non-relatives/Friends Legal Guardian: (NA) Name of Psychiatrist: None Name of Therapist: None  Education Status Is patient currently in school?: No Current Grade: (NA) Highest  grade of school patient has completed: (Some college) Name of school: (NA) Contact person: (NA)  Risk to self with the past 6 months Suicidal Ideation: No Has patient been a risk to self within the past 6 months prior to admission? : No Suicidal Intent: No Has patient had any suicidal intent within the past 6 months prior to admission? : No Is patient at risk for suicide?: No Suicidal Plan?: No Has patient had any suicidal plan within the past 6 months prior to admission? : No Access to Means: No Specify Access to Suicidal Means: (NA) What has been your use of drugs/alcohol within the last 12 months?: (NA) Previous Attempts/Gestures: Yes(Per hx) How many times?: (UTA per hx 5 ) Other Self Harm Risks: (UTA) Triggers for Past Attempts: Unknown Intentional Self Injurious Behavior: None Family Suicide History: (UTA) Recent stressful life event(s): (UTA) Persecutory voices/beliefs?: No Depression: No Depression Symptoms: (NA) Substance abuse history and/or treatment for substance abuse?: Yes Suicide prevention information given to non-admitted patients: Not applicable  Risk to Others within the past 6 months Homicidal Ideation: No Does patient have any lifetime risk of violence toward others beyond the six months prior to admission? : No Thoughts of Harm to Others: No Current Homicidal Intent: No Current Homicidal Plan: No Access to Homicidal Means: No Describe Access to Homicidal Means: NA Identified Victim: NA History of harm to others?: No Assessment of Violence: None Noted Violent Behavior Description: NA Does patient have access to weapons?: No Criminal Charges Pending?: No Does patient have a court date: No Is patient on probation?: No  Psychosis Hallucinations: Auditory, Visual Delusions: None noted  Mental Status Report Appearance/Hygiene: Bizarre Eye Contact: Poor Motor Activity: Agitation Speech: Rapid, Pressured Level of Consciousness: Irritable Mood:  Anxious, Labile Affect: Angry, Anxious Anxiety Level: Severe Thought Processes: Thought Blocking Judgement: Impaired Orientation: Place Obsessive Compulsive Thoughts/Behaviors: None  Cognitive Functioning Concentration: Poor Memory: (UTA) IQ: (UTA) Insight: Unable to Assess Impulse Control: Unable to Assess Appetite: (UTA) Weight Loss: (UTA) Weight Gain: (UTA) Sleep: (UTA) Total Hours of Sleep: (UTA) Vegetative Symptoms: (UTA)  ADLScreening [facility] Assessment Services) Patient's cognitive ability adequate to safely complete daily activities?: Yes Patient able to express need for assistance with ADLs?: Yes Independently performs ADLs?: Yes (appropriate for developmental age)  Prior Inpatient Therapy Prior Inpatient Therapy: Yes Prior Therapy Dates: 2018 [facility] Reason for Treatment: MH issues  Prior Outpatient Therapy Prior Outpatient Therapy: (UTA) Prior Therapy Dates: (UTA) Prior Therapy Facilty/Provider(s): (UTA) Reason for Treatment: (UTA) Does patient have an ACCT team?: No Does patient have Intensive In-House Services?  : No Does patient have [facility] services? : No Does patient have P4CC services?: No  ADL Screening (condition at time of admission) Patient's cognitive ability adequate to safely complete daily activities?: Yes Is the patient deaf or have difficulty hearing?: No Does the patient have difficulty seeing, even when wearing glasses/contacts?: No Does the patient have difficulty concentrating, remembering, or making decisions?: Yes Patient able to express need for assistance with ADLs?: Yes Does the patient have difficulty dressing or bathing?: No Independently performs ADLs?: Yes (appropriate for developmental age) Does the patient have difficulty walking or climbing stairs?: No Weakness of Legs: None Weakness of Arms/Hands: None  Home Assistive Devices/Equipment Home Assistive Devices/Equipment: None  Therapy  Consults (therapy consults require a physician order) PT Evaluation Needed: No OT Evalulation Needed: No SLP Evaluation Needed: No Abuse/Neglect Assessment (Assessment to be complete while patient is alone) Physical Abuse: Denies Verbal Abuse: Denies Sexual Abuse: Denies Exploitation of patient/patient's resources: Denies Self-Neglect: Denies Values / Beliefs Cultural Requests During Hospitalization: None Spiritual Requests During Hospitalization: None Consults Spiritual Care Consult Needed: No Social Work Consult Needed: No Merchant navy officer (For Healthcare) Does Patient Have a Medical Advance Directive?: No Would patient like information on creating a medical advance directive?: No - Patient declined    Additional Information 1:1 In Past 12 Months?: No CIRT Risk: No Elopement Risk: No Does patient have medical clearance?: Yes     Disposition: Case was staffed with Shaune Pollack [name] who recommended patient be monitored for safety and observed. Patient to be seen by psychiatry in the a.m.       Disposition Initial Assessment Completed for this Encounter: Yes Disposition of Patient: Other dispositions Type of inpatient treatment program: Adult Other disposition(s): Other (Comment)(Pt will be monitored for safety)  On Site Evaluation by:   Reviewed with Physician:    Alfredia Ferguson 04/23/2017 6:48 PM

## 2017-04-23 NOTE — ED Notes (Signed)
ED Provider at bedside. [name] 

## 2017-04-23 NOTE — ED Triage Notes (Signed)
Per [employer]. Pt denies SI/HI. Pt hearing voices. Pt is homeless and on a lot of drugs

## 2017-04-23 NOTE — ED Notes (Signed)
Pt admitted to room #34,pt endorsing AVH. Pt asking this nurse "where's my dad?" Pt anxious, voice loud at times, disorganized. Encouragement and support provided. Dinner provided. Special checks q 15 mins in place for safety, video monitoring in place, will continue to monitor.

## 2017-04-23 NOTE — ED Notes (Signed)
SBAR Report received from previous nurse. Pt received calm and visible on unit. Pt responding to internal stimulation and gave no meaningful answers to assessment questions related to current SI/ HI, A/ VH, depression, anxiety, or pain at this time, and appears otherwise stable and free of distress. Pt reminded of camera surveillance, q 15 min rounds, and rules of the milieu. Will continue to assess.

## 2017-04-24 DIAGNOSIS — F1410 Cocaine abuse, uncomplicated: Secondary | ICD-10-CM | POA: Diagnosis not present

## 2017-04-24 DIAGNOSIS — F1210 Cannabis abuse, uncomplicated: Secondary | ICD-10-CM

## 2017-04-24 DIAGNOSIS — F19950 Other psychoactive substance use, unspecified with psychoactive substance-induced psychotic disorder with delusions: Secondary | ICD-10-CM

## 2017-04-24 DIAGNOSIS — R443 Hallucinations, unspecified: Secondary | ICD-10-CM | POA: Diagnosis not present

## 2017-04-24 DIAGNOSIS — F17210 Nicotine dependence, cigarettes, uncomplicated: Secondary | ICD-10-CM | POA: Diagnosis not present

## 2017-04-24 MED ORDER — HYDROXYZINE HCL 25 MG PO TABS
25.0000 mg | ORAL_TABLET | Freq: Two times a day (BID) | ORAL | Status: DC
  Administered 2017-04-24: 25 mg via ORAL
  Filled 2017-04-24: qty 1

## 2017-04-24 MED ORDER — GABAPENTIN 300 MG PO CAPS
300.0000 mg | ORAL_CAPSULE | Freq: Three times a day (TID) | ORAL | Status: DC
  Administered 2017-04-24: 300 mg via ORAL
  Filled 2017-04-24: qty 1

## 2017-04-24 MED ORDER — TRAZODONE HCL 100 MG PO TABS: 100.0000 mg | ORAL_TABLET | Freq: Every evening | ORAL | Status: DC | PRN

## 2017-04-24 NOTE — [facility] Suicide Risk Assessment (Signed)
Suicide Risk Assessment  Discharge Assessment   [facility] Psychiatric Discharge Suicide Risk Assessment   Principal Problem: Substance-induced psychotic disorder with delusions [facility]) Discharge Diagnoses:  Patient Active Problem List   Diagnosis Date Noted  . Alcohol use disorder, severe, in early remission, in controlled environment (HCC) [F10.21] 04/22/2017  . 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 Polysubstance abuse (HCC) [F19.10]   . Schizoaffective disorder, bipolar type (HCC) [F25.0] 10/02/2015  . Involuntary commitment [Z04.6] 10/02/2015  . Cannabis use disorder, moderate, dependence (HCC) [F12.20] 09/16/2015  . Opioid use disorder, moderate, dependence (HCC) [F11.20] 09/16/2015  . Substance or medication-induced bipolar and related disorder with onset during intoxication (HCC) [Z61.096, F19.94] 09/16/2015  . 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 Benzodiazepine dependence (HCC) [F13.20] 10/04/2013  . PTSD (post-traumatic stress disorder) [F43.10] 10/03/2013  . Polysubstance dependence (HCC) [F19.20] 10/02/2013    Total Time spent with patient: 45 minutes  Musculoskeletal: Strength & Muscle Tone: within normal limits Gait & Station: normal Patient leans: N/A  Psychiatric Specialty Exam: Physical Exam  Constitutional: He is oriented to person, place, and time. He appears well-developed and well-nourished.  HENT:  Head: Normocephalic.  Respiratory: Effort normal.  Musculoskeletal: Normal range of motion.  Neurological: He is alert and oriented to person, place, and time.  Psychiatric: Thought content normal. His mood appears anxious. He is agitated. Cognition and memory are impaired. He expresses impulsivity. He exhibits a depressed  mood.   Review of Systems  Psychiatric/Behavioral: Positive for depression and substance abuse. Negative for hallucinations, memory loss and suicidal ideas. The patient is not nervous/anxious and does not have insomnia.   All other systems reviewed and are negative.  Blood pressure (!) 97/54, pulse 89, temperature 98.4 F (36.9 C), temperature source Oral, resp. rate 12, SpO2 100 %.There is no height or weight on file to calculate BMI. General Appearance: Disheveled Eye Contact:  Fair Speech:  Clear and Coherent Volume:  Decreased Mood:  Anxious, Depressed and Irritable Affect:  Congruent and Depressed Thought Process:  Coherent, Goal Directed and Linear Orientation:  Full (Time, Place, and Person) Thought Content:  Logical Suicidal Thoughts:  No Homicidal Thoughts:  No Memory:  Immediate;   Good Recent;   Good Remote;   Fair Judgement:  Poor Insight:  Lacking Psychomotor Activity:  Normal Concentration:  Concentration: Good and Attention Span: Good Recall:  Good Fund of Knowledge:  Good Language:  Good Akathisia:  No Handed:  Right AIMS (if indicated):    Assets:  Architect Housing ADL's:  Intact Cognition:  WNL   Mental Status Per Nursing Assessment::   On Admission:   auditory hallucinations while "high on multiple drugs"   Demographic Factors:  Male, Caucasian and Unemployed  Loss Factors: Financial problems/change in socioeconomic status  Historical Factors: Impulsivity  Risk Reduction Factors:   Sense of responsibility to family and Living with another person, especially a relative  Continued Clinical Symptoms:  Depression:   Impulsivity Alcohol/Substance Abuse/Dependencies  Cognitive Features That Contribute To Risk:  Closed-mindedness    Suicide Risk:  Minimal: No identifiable suicidal ideation.  Patients presenting with no risk factors but with morbid ruminations; may be classified as minimal risk based on  the severity of the depressive symptoms    Plan Of Care/Follow-up recommendations:  Activity:  as tolerated Diet:  Heart Healthy  Laveda AbbeLaurie Britton Ricky Gallery, NP 04/24/2017, 11:57 AM

## 2017-04-24 NOTE — Patient Outreach (Signed)
[facility] met with patient and provided substance use recovery support. Patient wants inpatient detox treatment for methamphetamines. Detox centers in the area currently do not detox for meth. CPSS talked to the patient in trying to get an inpatient treatment bed at [facility] or [facility], but they both currently have waiting list. CPSS also provided resources for NA meetings in [place] and [place] contact information. CPSS encouraged the patient to contact [facility]PSS at anytime for substance use recovery support.

## 2017-04-24 NOTE — Discharge Instructions (Signed)
[facility] Phone: [phone] Physical Address: [address] [place], [id number]  Sheral FlowMon - Fri walk-ins 8:00 - 5:00PM

## 2017-04-24 NOTE — ED Notes (Signed)
Pt d/c home per MD order. Discharge summary reviewed with pt. Pt uninterested, irritable. [employer] and security present. Not endorsing SI/HI. Personal property returned. Pt ambulatory off unit with [facility], security, [employer].

## 2017-04-24 NOTE — Consult Note (Signed)
[facility] Consult   Reason for Consult:  Auditory hallucinations Referring Physician:  EDP Patient Identification: Joshua Davila MRN:  [id number] Principal Diagnosis: Substance-induced psychotic disorder with delusions [facility]) Diagnosis:   Patient Active Problem List   Diagnosis Date Noted  . Alcohol use disorder, severe, in early remission, in controlled environment ([place]) [F10.21] 04/22/2017  . Amphetamine abuse ([place]) [F15.10] 03/14/2016  . Amphetamine and psychostimulant-induced psychosis with hallucinations ([place]) [F15.951] 03/14/2016  . Polysubstance abuse ([place]) [F19.10]   . Schizoaffective disorder, bipolar type ([place]) [F25.0] 10/02/2015  . Involuntary commitment [Z04.6] 10/02/2015  . Cannabis use disorder, moderate, dependence ([place]) [F12.20] 09/16/2015  . Opioid use disorder, moderate, dependence ([place]) [F11.20] 09/16/2015  . Substance or medication-induced bipolar and related disorder with onset during intoxication ([place]) [N56.213, F19.94] 09/16/2015  . Substance-induced psychotic disorder with delusions ([place]) [F19.950] 09/16/2015  . 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 Methamphetamine use disorder, severe ([place]) [F15.20] 06/20/2015  . Benzodiazepine dependence ([place]) [F13.20] 10/04/2013  . PTSD (post-traumatic stress disorder) [F43.10] 10/03/2013  . Polysubstance dependence ([place]) [F19.20] 10/02/2013    Total Time spent with patient: 45 minutes  Subjective:   Joshua Davila is a 37 y.o. male patient admitted with auditory hallucinations while high on multiple drugs.  HPI:  Pt was seen and chart reviewed with treatment team and Dr Darleene Cleaver. Pt stated he wasn't sure why he is at the hospital and wants to call his father.  Pt denies suicidal/homicidal ideation, denies visual hallucinations and does not appear to be responding to internal stimuli. Today, Pt denies hearing auditory hallucinations. Pt will be seen by Peer Support for assistance with resources in the  community for substance abuse treatment programs. Pt's UDS positive for amphetamines, benzos, THC, BAL negative. Pt has a long history of drug abuse and is well known to this hospital system. Pt is psychiatrically stable for discharge.   Past Psychiatric History: As above  Risk to Self: None Risk to Others: None Prior Inpatient Therapy: Prior Inpatient Therapy: Yes Prior Therapy Dates: 2018 Prior Therapy Facilty/Provider(s): [facility] Reason for Treatment: MH issues Prior Outpatient Therapy: Prior Outpatient Therapy: (UTA) Prior Therapy Dates: (UTA) Prior Therapy Facilty/Provider(s): (UTA) Reason for Treatment: (UTA) Does patient have an ACCT team?: No Does patient have Intensive In-House Services?  : No Does patient have [facility] services? : No Does patient have P4CC services?: No  Past Medical History:  Past Medical History:  Diagnosis Date  . Depression   . Gender identity disorder   . Liver disorder   . PTSD (post-traumatic stress disorder)   . Schizoaffective disorder ([place])    History reviewed. No pertinent surgical history. Family History:  Family History  Problem Relation Age of Onset  . Multiple sclerosis Mother   . Mental illness Neg Hx    Family Psychiatric  History: Unknown Social History:  Social History   Substance and Sexual Activity  Alcohol Use Yes   Comment: occasional     Social History   Substance and Sexual Activity  Drug Use Yes  . Types: Cocaine, Methamphetamines, Marijuana   Comment: last useof methamphetamines and marijuana yesterday    Social History   Socioeconomic History  . Marital status: Single    Spouse name: None  . Number of children: None  . Years of education: None  . Highest education level: None  Social Needs  . Financial resource strain: None  . Food insecurity - worry: None  . Food insecurity - inability: None  . Transportation needs -  medical: None  . Transportation needs - non-medical: None  Occupational History  . None   Tobacco Use  . Smoking status: Current Every Day Smoker    Packs/day: 1.00    Types: Cigarettes  . Smokeless tobacco: Never Used  Substance and Sexual Activity  . Alcohol use: Yes    Comment: occasional  . Drug use: Yes    Types: Cocaine, Methamphetamines, Marijuana    Comment: last useof methamphetamines and marijuana yesterday  . Sexual activity: Not Currently  Other Topics Concern  . None  Social History Narrative  . None   Additional Social History:    Allergies:   Allergies  Allergen Reactions  . Penicillins Swelling, Rash and Other (See Comments)    Reaction:  Facial swelling Has patient had a PCN reaction causing immediate rash, facial/tongue/throat swelling, SOB or lightheadedness with hypotension: Yes Has patient had a PCN reaction causing severe rash involving mucus membranes or skin necrosis: No Has patient had a PCN reaction that required hospitalization No Has patient had a PCN reaction occurring within the last 10 years: No If all of the above answers are "NO", then may proceed with Cephalosporin use.  . Sulfa Antibiotics Rash    Labs:  Results for orders placed or performed during the hospital encounter of 04/23/17 (from the past 48 hour(s))  Rapid urine drug screen (hospital performed)     Status: Abnormal   Collection Time: 04/23/17  5:14 PM  Result Value Ref Range   Opiates NONE DETECTED NONE DETECTED   Cocaine NONE DETECTED NONE DETECTED   Benzodiazepines POSITIVE (A) NONE DETECTED   Amphetamines POSITIVE (A) NONE DETECTED   Tetrahydrocannabinol POSITIVE (A) NONE DETECTED   Barbiturates NONE DETECTED NONE DETECTED    Comment: (NOTE) DRUG SCREEN FOR MEDICAL PURPOSES ONLY.  IF CONFIRMATION IS NEEDED FOR ANY PURPOSE, NOTIFY LAB WITHIN 5 DAYS. LOWEST DETECTABLE LIMITS FOR URINE DRUG SCREEN Drug Class                     Cutoff (ng/mL) Amphetamine and metabolites    1000 Barbiturate and metabolites    200 Benzodiazepine                  130 Tricyclics and metabolites     300 Opiates and metabolites        300 Cocaine and metabolites        300 THC                            50 Performed at [facility], [place] [address] [place], [place] [address]   Comprehensive metabolic panel     Status: Abnormal   Collection Time: 04/23/17  5:42 PM  Result Value Ref Range   Sodium 136 135 - 145 mmol/L   Potassium 4.0 3.5 - 5.1 mmol/L    Comment: DELTA CHECK NOTED NO VISIBLE HEMOLYSIS    Chloride 103 101 - 111 mmol/L   CO2 24 22 - 32 mmol/L   Glucose, Bld 101 (H) 65 - 99 mg/dL   BUN 9 6 - 20 mg/dL   Creatinine, Ser 0.86 0.61 - 1.24 mg/dL   Calcium 9.4 8.9 - 10.3 mg/dL   Total Protein 7.5 6.5 - 8.1 g/dL   Albumin 4.1 3.5 - 5.0 g/dL   AST 32 15 - 41 U/L   ALT 30 17 - 63 U/L   Alkaline Phosphatase 58 38 - 126 U/L  Total Bilirubin 0.5 0.3 - 1.2 mg/dL   GFR calc non Af Amer >60 >60 mL/min   GFR calc Af Amer >60 >60 mL/min    Comment: (NOTE) The eGFR has been calculated using the CKD EPI equation. This calculation has not been validated in all clinical situations. eGFR's persistently <60 mL/min signify possible Chronic Kidney Disease.    Anion gap 9 5 - 15    Comment: Performed at [facility], [place] [address] [place], [place] [address]  cbc     Status: Abnormal   Collection Time: 04/23/17  5:42 PM  Result Value Ref Range   WBC 6.1 4.0 - 10.5 K/uL   RBC 4.58 4.22 - 5.81 MIL/uL   Hemoglobin 13.6 13.0 - 17.0 g/dL   HCT 38.5 (L) 39.0 - 52.0 %   MCV 84.1 78.0 - 100.0 fL   MCH 29.7 26.0 - 34.0 pg   MCHC 35.3 30.0 - 36.0 g/dL   RDW 13.7 11.5 - 15.5 %   Platelets 203 150 - 400 K/uL    Comment: Performed at [facility], [place] [address] [place], [place] [address]  Ethanol     Status: None   Collection Time: 04/23/17  5:43 PM  Result Value Ref Range   Alcohol, Ethyl (B) <10 <10 mg/dL    Comment:        LOWEST DETECTABLE LIMIT FOR SERUM ALCOHOL IS 10 mg/dL FOR  MEDICAL PURPOSES ONLY Performed at [facility], [place] [address] [place], [place] [address]   Salicylate level     Status: None   Collection Time: 04/23/17  5:43 PM  Result Value Ref Range   Salicylate Lvl <0.1 2.8 - 30.0 mg/dL    Comment: Performed at [facility], [place] [address] [place], [place] [address]  Acetaminophen level     Status: Abnormal   Collection Time: 04/23/17  5:43 PM  Result Value Ref Range   Acetaminophen (Tylenol), Serum <10 (L) 10 - 30 ug/mL    Comment:        THERAPEUTIC CONCENTRATIONS VARY SIGNIFICANTLY. A RANGE OF 10-30 ug/mL MAY BE AN EFFECTIVE CONCENTRATION FOR MANY PATIENTS. HOWEVER, SOME ARE BEST TREATED AT CONCENTRATIONS OUTSIDE THIS RANGE. ACETAMINOPHEN CONCENTRATIONS >150 ug/mL AT 4 HOURS AFTER INGESTION AND >50 ug/mL AT 12 HOURS AFTER INGESTION ARE OFTEN ASSOCIATED WITH TOXIC REACTIONS. Performed at [facility], [place] [address], [place], [place] [address]     Current Facility-Administered Medications  Medication Dose Route Frequency Provider Last Rate Last Dose  . gabapentin (NEURONTIN) capsule 300 mg  300 mg Oral TID Corena Pilgrim, MD   300 mg at 04/24/17 1043  . hydrOXYzine (ATARAX/VISTARIL) tablet 25 mg  25 mg Oral BID Corena Pilgrim, MD   25 mg at 04/24/17 1043  . ibuprofen (ADVIL,MOTRIN) tablet 600 mg  600 mg Oral Q8H PRN Danayah Smyre, MD      . traZODone (DESYREL) tablet 100 mg  100 mg Oral QHS PRN Claire Bridge, MD       Current Outpatient Medications  Medication Sig Dispense Refill  . paliperidone (INVEGA) 6 MG 24 hr tablet Take 6 mg by mouth daily.    Marland Kitchen atorvastatin (LIPITOR) 20 MG tablet Take 1 tablet (20 mg total) by mouth daily. For high Cholesterol (Patient not taking: Reported on 04/23/2017) 1 tablet 0  . emtricitabine-tenofovir (TRUVADA) 200-300 MG tablet Take 1 tablet by mouth daily. For pre-exposure to HIV (Patient not taking: Reported on 04/21/2017)    .  mupirocin ointment (BACTROBAN) 2 % Apply 1 application topically 2 (two) times daily. (Patient not taking: Reported on 04/21/2017) 22 g 0  . nicotine polacrilex (NICORETTE) 2 MG gum Take 1 each (2 mg total) by mouth as needed for smoking cessation. (Patient not taking: Reported on 05/30/2016) 100 tablet 0    Musculoskeletal: Strength & Muscle Tone: within normal limits Gait & Station: normal Patient leans: N/A  Psychiatric Specialty Exam: Physical Exam  Constitutional: He is oriented to person, place, and time. He appears well-developed and well-nourished.  HENT:  Head: Normocephalic.  Respiratory: Effort normal.  Musculoskeletal: Normal range of motion.  Neurological: He is alert and oriented to person, place, and time.  Psychiatric: Thought content normal. His mood appears anxious. He is agitated. Cognition and memory are impaired. He expresses impulsivity. He exhibits a depressed mood.    Review of Systems  Psychiatric/Behavioral: Positive for depression and substance abuse. Negative for hallucinations, memory loss and suicidal ideas. The patient is not nervous/anxious and does not have insomnia.   All other systems reviewed and are negative.   Blood pressure (!) 97/54, pulse 89, temperature 98.4 F (36.9 C), temperature source Oral, resp. rate 12, SpO2 100 %.There is no height or weight on file to calculate BMI.  General Appearance: Disheveled  Eye Contact:  Fair  Speech:  Clear and Coherent  Volume:  Decreased  Mood:  Anxious, Depressed and Irritable  Affect:  Congruent and Depressed  Thought Process:  Coherent, Goal Directed and Linear  Orientation:  Full (Time, Place, and Person)  Thought Content:  Logical  Suicidal Thoughts:  No  Homicidal Thoughts:  No  Memory:  Immediate;   Good Recent;   Good Remote;   Fair  Judgement:  Poor  Insight:  Lacking  Psychomotor Activity:  Normal  Concentration:  Concentration: Good and Attention Span: Good  Recall:  Good  Fund of  Knowledge:  Good  Language:  Good  Akathisia:  No  Handed:  Right  AIMS (if indicated):     Assets:  Agricultural consultant Housing  ADL's:  Intact  Cognition:  WNL  Sleep:        Treatment Plan Summary: Plan Substance-induced psychotic disorder with delusions ([place])  Discharge Home Follow up with [facility] for medication management and therapy Take all medications as prescribed Avoid the use of alcohol and illicit drugs  Disposition: No evidence of imminent risk to self or others at present.   Patient does not meet criteria for psychiatric inpatient admission. Supportive therapy provided about ongoing stressors. Discussed crisis plan, support from social network, calling 911, coming to the Emergency Department, and calling Suicide Hotline.  Ethelene Hal, NP 04/24/2017 11:42 AM  Patient seen face-to-face for psychiatric evaluation, chart reviewed and case discussed with the physician extender and developed treatment plan. Reviewed the information documented and agree with the treatment plan. Corena Pilgrim, MD

## 2017-04-26 ENCOUNTER — Emergency Department (HOSPITAL_COMMUNITY)
Admission: EM | Admit: 2017-04-26 | Discharge: 2017-04-26 | Disposition: A | Discharge status: Home / Self Care | Payer: Medicare Other | Attending: Emergency Medicine | Admitting: Emergency Medicine

## 2017-04-26 ENCOUNTER — Encounter (HOSPITAL_COMMUNITY): Payer: Self-pay | Admitting: Emergency Medicine

## 2017-04-26 DIAGNOSIS — F419 Anxiety disorder, unspecified: Secondary | ICD-10-CM | POA: Insufficient documentation

## 2017-04-26 DIAGNOSIS — F22 Delusional disorders: Secondary | ICD-10-CM | POA: Diagnosis not present

## 2017-04-26 DIAGNOSIS — F28 Other psychotic disorder not due to a substance or known physiological condition: Secondary | ICD-10-CM | POA: Diagnosis not present

## 2017-04-26 DIAGNOSIS — Z5321 Procedure and treatment not carried out due to patient leaving prior to being seen by health care provider: Secondary | ICD-10-CM | POA: Insufficient documentation

## 2017-04-26 NOTE — ED Triage Notes (Signed)
Pt brought in by EMS. Pt reports he has been having anxiety and wants to be [facility]. Pt has not been taking home haldol. Denies SI/HI.

## 2017-04-26 NOTE — ED Notes (Signed)
Called patient for assigned room with no answer.  

## 2017-04-26 NOTE — ED Notes (Signed)
Did not respond to call

## 2017-04-26 NOTE — ED Triage Notes (Signed)
Per EMS-states he has been off his Haldol for a couple of days-patient states he is freaking out cause he did some acid-wants us to find him somewhere to live-states he is a veteran and gets disability and wants long term treatment

## 2017-07-22 ENCOUNTER — Other Ambulatory Visit: Payer: Self-pay

## 2017-07-22 ENCOUNTER — Emergency Department (HOSPITAL_COMMUNITY)
Admission: EM | Admit: 2017-07-22 | Discharge: 2017-07-22 | Disposition: A | Discharge status: Home / Self Care | Payer: Medicare Other | Attending: Emergency Medicine | Admitting: Emergency Medicine

## 2017-07-22 DIAGNOSIS — M79606 Pain in leg, unspecified: Secondary | ICD-10-CM | POA: Diagnosis present

## 2017-07-22 DIAGNOSIS — Z23 Encounter for immunization: Secondary | ICD-10-CM | POA: Diagnosis not present

## 2017-07-22 DIAGNOSIS — F1510 Other stimulant abuse, uncomplicated: Secondary | ICD-10-CM | POA: Diagnosis not present

## 2017-07-22 DIAGNOSIS — F259 Schizoaffective disorder, unspecified: Secondary | ICD-10-CM | POA: Diagnosis not present

## 2017-07-22 DIAGNOSIS — R45851 Suicidal ideations: Secondary | ICD-10-CM | POA: Diagnosis not present

## 2017-07-22 DIAGNOSIS — F4310 Post-traumatic stress disorder, unspecified: Secondary | ICD-10-CM | POA: Diagnosis present

## 2017-07-22 DIAGNOSIS — Z046 Encounter for general psychiatric examination, requested by authority: Secondary | ICD-10-CM

## 2017-07-22 LAB — CBC
HEMATOCRIT: 37.8 % — AB (ref 39.0–52.0)
HEMOGLOBIN: 13.2 g/dL (ref 13.0–17.0)
MCH: 29.9 pg (ref 26.0–34.0)
MCHC: 34.9 g/dL (ref 30.0–36.0)
MCV: 85.5 fL (ref 78.0–100.0)
Platelets: 231 10*3/uL (ref 150–400)
RBC: 4.42 MIL/uL (ref 4.22–5.81)
RDW: 14.3 % (ref 11.5–15.5)
WBC: 11.2 10*3/uL — ABNORMAL HIGH (ref 4.0–10.5)

## 2017-07-22 LAB — SALICYLATE LEVEL

## 2017-07-22 LAB — COMPREHENSIVE METABOLIC PANEL
ALBUMIN: 4.7 g/dL (ref 3.5–5.0)
ALT: 41 U/L (ref 17–63)
ANION GAP: 11 (ref 5–15)
AST: 53 U/L — ABNORMAL HIGH (ref 15–41)
Alkaline Phosphatase: 54 U/L (ref 38–126)
BUN: 16 mg/dL (ref 6–20)
CHLORIDE: 109 mmol/L (ref 101–111)
CO2: 21 mmol/L — AB (ref 22–32)
Calcium: 10.1 mg/dL (ref 8.9–10.3)
Creatinine, Ser: 1.17 mg/dL (ref 0.61–1.24)
GFR calc non Af Amer: >60 mL/min (ref >60)
GLUCOSE: 119 mg/dL — AB (ref 65–99)
Potassium: 3.8 mmol/L (ref 3.5–5.1)
SODIUM: 141 mmol/L (ref 135–145)
Total Bilirubin: 0.8 mg/dL (ref 0.3–1.2)
Total Protein: 8.5 g/dL — ABNORMAL HIGH (ref 6.5–8.1)

## 2017-07-22 LAB — RAPID URINE DRUG SCREEN, HOSP PERFORMED
AMPHETAMINES: POSITIVE — AB
BENZODIAZEPINES: POSITIVE — AB
Barbiturates: NOT DETECTED
COCAINE: NOT DETECTED
OPIATES: NOT DETECTED
TETRAHYDROCANNABINOL: POSITIVE — AB

## 2017-07-22 LAB — ACETAMINOPHEN LEVEL

## 2017-07-22 LAB — ETHANOL: Alcohol, Ethyl (B): <10 mg/dL (ref <10)

## 2017-07-22 MED ORDER — TETANUS-DIPHTH-ACELL PERTUSSIS 5-2.5-18.5 LF-MCG/0.5 IM SUSP
0.5000 mL | Freq: Once | INTRAMUSCULAR | Status: AC
  Administered 2017-07-22: 0.5 mL via INTRAMUSCULAR
  Filled 2017-07-22: qty 0.5

## 2017-07-22 NOTE — BH Assessment (Signed)
[facility] Assessment Progress Note   Pt would not wake up for assessment.  Name called several times, pt kept snoring.  TTS to assess when pt alert and oriented.
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
## 2017-07-22 NOTE — BH Assessment (Signed)
[facility] Assessment Progress Note  Per Juanetta Beets, DO, this pt does not require psychiatric hospitalization at this time.  Pt presents under IVC initiated by pt's spouse, Delphina Cahill [phone]), which Dr Sharma Covert has rescinded.  Pt is to be discharged from [facility].  No outpatient referrals are necessary.  Pt reportedly denies having access to firearms, but endorses having access to other weapons.  At Dr Kirstie Peri request, this writer called Mr Theodore Demark to recommend that these be secured.  Call was placed at 10:25.  Call rolled to a voice message that indicated that mail box was not configured to accept messages; for this reason, no message was left..  Pt's nurse, [place], has been notified that pt is ready for discharge.  Doylene Canning, MA Triage Specialist [phone]
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
## 2017-07-22 NOTE — ED Notes (Signed)
Bed: [facility] Expected date:  Expected time:  Means of arrival:  Comments: 
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
## 2017-08-05 ENCOUNTER — Emergency Department (HOSPITAL_COMMUNITY)
Admission: EM | Admit: 2017-08-05 | Discharge: 2017-08-06 | Disposition: A | Discharge status: Home / Self Care | Payer: Medicare Other | Attending: Emergency Medicine | Admitting: Emergency Medicine

## 2017-08-05 ENCOUNTER — Encounter (HOSPITAL_COMMUNITY): Payer: Self-pay | Admitting: Emergency Medicine

## 2017-08-05 ENCOUNTER — Other Ambulatory Visit: Payer: Self-pay

## 2017-08-05 DIAGNOSIS — R110 Nausea: Secondary | ICD-10-CM | POA: Insufficient documentation

## 2017-08-05 DIAGNOSIS — Z046 Encounter for general psychiatric examination, requested by authority: Secondary | ICD-10-CM | POA: Diagnosis not present

## 2017-08-05 DIAGNOSIS — F323 Major depressive disorder, single episode, severe with psychotic features: Secondary | ICD-10-CM | POA: Diagnosis not present

## 2017-08-05 DIAGNOSIS — F1510 Other stimulant abuse, uncomplicated: Secondary | ICD-10-CM | POA: Diagnosis present

## 2017-08-05 DIAGNOSIS — F22 Delusional disorders: Secondary | ICD-10-CM | POA: Insufficient documentation

## 2017-08-05 DIAGNOSIS — Z79899 Other long term (current) drug therapy: Secondary | ICD-10-CM | POA: Insufficient documentation

## 2017-08-05 DIAGNOSIS — F15951 Other stimulant use, unspecified with stimulant-induced psychotic disorder with hallucinations: Secondary | ICD-10-CM | POA: Diagnosis not present

## 2017-08-05 DIAGNOSIS — F15151 Other stimulant abuse with stimulant-induced psychotic disorder with hallucinations: Secondary | ICD-10-CM | POA: Diagnosis not present

## 2017-08-05 DIAGNOSIS — F17210 Nicotine dependence, cigarettes, uncomplicated: Secondary | ICD-10-CM | POA: Insufficient documentation

## 2017-08-05 DIAGNOSIS — F1290 Cannabis use, unspecified, uncomplicated: Secondary | ICD-10-CM | POA: Diagnosis not present

## 2017-08-05 DIAGNOSIS — R4689 Other symptoms and signs involving appearance and behavior: Secondary | ICD-10-CM

## 2017-08-05 DIAGNOSIS — F1490 Cocaine use, unspecified, uncomplicated: Secondary | ICD-10-CM | POA: Diagnosis not present

## 2017-08-05 DIAGNOSIS — F918 Other conduct disorders: Secondary | ICD-10-CM | POA: Diagnosis present

## 2017-08-05 DIAGNOSIS — F4310 Post-traumatic stress disorder, unspecified: Secondary | ICD-10-CM | POA: Diagnosis present

## 2017-08-05 LAB — COMPREHENSIVE METABOLIC PANEL
ALBUMIN: 4.3 g/dL (ref 3.5–5.0)
ALT: 46 U/L (ref 17–63)
ANION GAP: 12 (ref 5–15)
AST: 42 U/L — AB (ref 15–41)
Alkaline Phosphatase: 60 U/L (ref 38–126)
BUN: 11 mg/dL (ref 6–20)
CO2: 22 mmol/L (ref 22–32)
Calcium: 9.6 mg/dL (ref 8.9–10.3)
Chloride: 105 mmol/L (ref 101–111)
Creatinine, Ser: 0.9 mg/dL (ref 0.61–1.24)
GFR calc Af Amer: >60 mL/min (ref >60)
GLUCOSE: 112 mg/dL — AB (ref 65–99)
POTASSIUM: 4 mmol/L (ref 3.5–5.1)
SODIUM: 139 mmol/L (ref 135–145)
TOTAL PROTEIN: 7.7 g/dL (ref 6.5–8.1)
Total Bilirubin: 0.8 mg/dL (ref 0.3–1.2)

## 2017-08-05 LAB — CBC
HCT: 35.4 % — ABNORMAL LOW (ref 39.0–52.0)
HEMOGLOBIN: 12.3 g/dL — AB (ref 13.0–17.0)
MCH: 30.1 pg (ref 26.0–34.0)
MCHC: 34.7 g/dL (ref 30.0–36.0)
MCV: 86.8 fL (ref 78.0–100.0)
Platelets: 249 10*3/uL (ref 150–400)
RBC: 4.08 MIL/uL — AB (ref 4.22–5.81)
RDW: 14.8 % (ref 11.5–15.5)
WBC: 7.3 10*3/uL (ref 4.0–10.5)

## 2017-08-05 LAB — RAPID URINE DRUG SCREEN, HOSP PERFORMED
AMPHETAMINES: POSITIVE — AB
BENZODIAZEPINES: NOT DETECTED
Barbiturates: NOT DETECTED
COCAINE: NOT DETECTED
OPIATES: NOT DETECTED
TETRAHYDROCANNABINOL: POSITIVE — AB

## 2017-08-05 LAB — ETHANOL

## 2017-08-05 MED ORDER — NICOTINE POLACRILEX 2 MG MT GUM: 2.0000 mg | CHEWING_GUM | OROMUCOSAL | Status: DC | PRN

## 2017-08-05 MED ORDER — ONDANSETRON 4 MG PO TBDP
4.0000 mg | ORAL_TABLET | Freq: Once | ORAL | Status: AC
  Administered 2017-08-05: 4 mg via ORAL
  Filled 2017-08-05: qty 1

## 2017-08-05 MED ORDER — GABAPENTIN 300 MG PO CAPS
300.0000 mg | ORAL_CAPSULE | Freq: Three times a day (TID) | ORAL | Status: DC
  Administered 2017-08-05 – 2017-08-06 (×3): 300 mg via ORAL
  Filled 2017-08-05 (×4): qty 1

## 2017-08-05 MED ORDER — EMTRICITABINE-TENOFOVIR AF 200-25 MG PO TABS: 1.0000 | ORAL_TABLET | Freq: Every day | ORAL | Status: DC

## 2017-08-05 MED ORDER — MUPIROCIN 2 % EX OINT: 1.0000 "application " | TOPICAL_OINTMENT | Freq: Two times a day (BID) | CUTANEOUS | Status: DC

## 2017-08-05 MED ORDER — ATORVASTATIN CALCIUM 20 MG PO TABS
20.0000 mg | ORAL_TABLET | Freq: Every day | ORAL | Status: DC
  Administered 2017-08-05: 20 mg via ORAL
  Filled 2017-08-05: qty 1

## 2017-08-05 MED ORDER — PALIPERIDONE ER 6 MG PO TB24
6.0000 mg | ORAL_TABLET | Freq: Every day | ORAL | Status: DC
  Administered 2017-08-05 – 2017-08-06 (×2): 6 mg via ORAL
  Filled 2017-08-05 (×2): qty 1

## 2017-08-05 NOTE — ED Notes (Signed)
Pt given Zofran for nausea. He did not eat any lunch and said he had "vomited in my mouth." He had eaten some breakfast.
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
## 2017-08-05 NOTE — ED Notes (Signed)
Patient withdrawn and with dull, flat affect and is avoidant of staff. Pt does, however deny any thoughts of wanting to harm himself or others at this time and verbally contracts for safety. When this writer inquired as to why the patient was brought to the hospital, pt states "I don't know, the police brought me". Pt declined to elaborate on reason. No distress noted at this time.

## 2017-08-05 NOTE — ED Notes (Signed)
Pt denies SI and HI. He says he feels like no one has accurate information about him. "I feel like I am in prison." Thought blocking, poor eye contact, slow speech. Pt is cooperative. Pt oriented to location of restrooms and presence of camera monitoring. Given water and encouraged to rest.
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
## 2017-08-06 DIAGNOSIS — F1290 Cannabis use, unspecified, uncomplicated: Secondary | ICD-10-CM

## 2017-08-06 DIAGNOSIS — F1490 Cocaine use, unspecified, uncomplicated: Secondary | ICD-10-CM

## 2017-08-06 DIAGNOSIS — F17210 Nicotine dependence, cigarettes, uncomplicated: Secondary | ICD-10-CM

## 2017-08-06 DIAGNOSIS — F15151 Other stimulant abuse with stimulant-induced psychotic disorder with hallucinations: Secondary | ICD-10-CM

## 2017-08-06 DIAGNOSIS — R450 Nervousness: Secondary | ICD-10-CM | POA: Diagnosis not present

## 2017-08-06 DIAGNOSIS — F323 Major depressive disorder, single episode, severe with psychotic features: Secondary | ICD-10-CM | POA: Diagnosis not present

## 2017-08-06 DIAGNOSIS — F419 Anxiety disorder, unspecified: Secondary | ICD-10-CM | POA: Diagnosis not present

## 2017-08-06 MED ORDER — ARIPIPRAZOLE 10 MG PO TABS: 10.0000 mg | ORAL_TABLET | Freq: Every day | ORAL | 0 refills | Status: DC

## 2017-08-06 MED ORDER — GABAPENTIN 300 MG PO CAPS: 300.0000 mg | ORAL_CAPSULE | Freq: Three times a day (TID) | ORAL | 0 refills | Status: DC

## 2017-08-06 MED ORDER — PALIPERIDONE ER 6 MG PO TB24: 6.0000 mg | ORAL_TABLET | Freq: Every day | ORAL | 0 refills | Status: DC

## 2017-08-06 MED ORDER — ACETAMINOPHEN 325 MG PO TABS
650.0000 mg | ORAL_TABLET | Freq: Once | ORAL | Status: AC
  Administered 2017-08-06: 650 mg via ORAL
  Filled 2017-08-06: qty 2

## 2017-08-06 NOTE — BHH Suicide Risk Assessment (Signed)
Suicide Risk Assessment  Discharge Assessment   [facility] Discharge Suicide Risk Assessment   Principal Problem: Amphetamine and psychostimulant-induced psychosis with hallucinations [facility]) Discharge Diagnoses:  Patient Active Problem List   Diagnosis Date Noted  . 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 Amphetamine and psychostimulant-induced psychosis with hallucinations [facility]) [F15.951] 03/14/2016    Priority: High  . PTSD (post-traumatic stress disorder) [F43.10] 10/03/2013    Priority: High  . 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 Polysubstance dependence (HCC) [F19.20] 10/02/2013    Priority: Low  . Alcohol use disorder, severe, in early remission, in controlled environment (HCC) [F10.21] 04/22/2017  . Polysubstance abuse (HCC) [F19.10]   . Schizoaffective disorder, bipolar type (HCC) [F25.0] 10/02/2015  . Involuntary commitment [Z04.6] 10/02/2015  . Cannabis use disorder, moderate, dependence (HCC) [F12.20] 09/16/2015  . Opioid use disorder, moderate, dependence (HCC) [F11.20] 09/16/2015  . Substance or medication-induced bipolar and related disorder with onset during intoxication (HCC) [W09.811, F19.94] 09/16/2015  . 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 Benzodiazepine dependence (HCC) [F13.20] 10/04/2013    Total Time spent with patient: 45 minutes  Musculoskeletal: Strength & Muscle Tone: within normal limits Gait & Station: normal Patient leans: N/A  Psychiatric Specialty Exam: Physical Exam  Constitutional: He is oriented to person, place, and time. He appears well-developed and well-nourished.  HENT:  Head: Normocephalic.  Neck: Normal range of motion.  Respiratory: Effort normal.  Musculoskeletal: Normal range of motion.  Neurological: He is alert and oriented to person, place, and time.  Psychiatric: His speech  is normal and behavior is normal. Judgment and thought content normal. His mood appears anxious. Cognition and memory are normal.    Review of Systems  Psychiatric/Behavioral: Positive for substance abuse. The patient is nervous/anxious.   All other systems reviewed and are negative.   Blood pressure 94/61, pulse 98, temperature 98.8 F (37.1 C), temperature source Oral, resp. rate 14, height  (1.778 m), weight 81.6 kg (180 lb), SpO2 99 %.Body mass index is 25.83 kg/m.  General Appearance: Disheveled  Eye Contact:  Good  Speech:  Normal Rate  Volume:  Normal  Mood:  Euthymic  Affect:  Congruent  Thought Process:  Coherent and Descriptions of Associations: Intact  Orientation:  Full (Time, Place, and Person)  Thought Content:  WDL and Logical  Suicidal Thoughts:  No  Homicidal Thoughts:  No  Memory:  Immediate;   Fair Recent;   Fair Remote;   Fair  Judgement:  Fair  Insight:  Fair  Psychomotor Activity:  Normal  Concentration:  Concentration: Good and Attention Span: Good  Recall:  [employer] of Knowledge:  Fair  Language:  Good  Akathisia:  No  Handed:  Right  AIMS (if indicated):     Assets:  Housing Leisure Time Physical Health Resilience Social Support  ADL's:  Intact  Cognition:  WNL  Sleep:      Mental Status Per Nursing Assessment::   On Admission:   meth abuse with agitation  Demographic Factors:  Male, Caucasian and Gay, lesbian, or bisexual orientation  Loss Factors: NA  Historical Factors: NA  Risk Reduction Factors:   Sense of responsibility to family, Living with another person, especially a relative, Positive social support and Positive therapeutic relationship  Continued Clinical Symptoms:  None   Cognitive Features That Contribute To Risk:  None    Suicide Risk:  Minimal: No identifiable suicidal ideation.  Patients presenting with no risk factors but with morbid ruminations; may be classified as minimal risk based on the severity  of the depressive symptoms    Plan Of Care/Follow-up recommendations:  Activity:  as tolerated Diet:  heart healthy diet  Talina Pleitez, NP 08/06/2017, 5:00 PM

## 2017-08-06 NOTE — Discharge Instructions (Signed)
For your mental health needs, you are advised to follow up with [facility].  New and returning patients are seen at their walk-in clinic.  Walk-in hours are Monday - Friday from 8:00 am - 3:00 pm.  Walk-in patients are seen on a first come, first served basis.  Try to arrive as early as possible for he best chance of being seen the same day: ° °     [facility] °     [address] °     [place], [place] [address] °     [phone] °

## 2017-08-06 NOTE — BH Assessment (Signed)
[facility] Assessment Progress Note  Per Juanetta Beets, DO, this pt does not require psychiatric hospitalization at this time.  Pt presents under IVC initiated by pt's spouse, and upheld by EDP Rochele Raring, MD, which Dr Sharma Covert has rescinded.  Pt is to be discharged from [facility] with recommendation to follow up with [facility].  This has been included in pt's discharge instructions.  Pt's nurse, Aram Beecham, has been notified.  Doylene Canning, MA Triage Specialist [phone]

## 2017-08-06 NOTE — Consult Note (Addendum)
[place] Psychiatry Consult   Reason for Consult:  Meth use with agitation  Referring Physician:  EDP Patient Identification: Joshua Davila MRN:  [id number] Principal Diagnosis: Amphetamine and psychostimulant-induced psychosis with hallucinations [facility]) Diagnosis:   Patient Active Problem List   Diagnosis Date Noted  . Amphetamine abuse ([place]) [F15.10] 03/14/2016    Priority: High  . Amphetamine and psychostimulant-induced psychosis with hallucinations [facility]) [F15.951] 03/14/2016    Priority: High  . PTSD (post-traumatic stress disorder) [F43.10] 10/03/2013    Priority: High  . Methamphetamine use disorder, severe ([place]) [F15.20] 06/20/2015    Priority: Low  . Polysubstance dependence ([place]) [F19.20] 10/02/2013    Priority: Low  . Alcohol use disorder, severe, in early remission, in controlled environment ([place]) [F10.21] 04/22/2017  . Polysubstance abuse ([place]) [F19.10]   . Schizoaffective disorder, bipolar type ([place]) [F25.0] 10/02/2015  . Involuntary commitment [Z04.6] 10/02/2015  . Cannabis use disorder, moderate, dependence ([place]) [F12.20] 09/16/2015  . Opioid use disorder, moderate, dependence ([place]) [F11.20] 09/16/2015  . Substance or medication-induced bipolar and related disorder with onset during intoxication ([place]) [E09.233, F19.94] 09/16/2015  . Substance-induced psychotic disorder with delusions ([place]) [F19.950] 09/16/2015  . 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 Benzodiazepine dependence ([place]) [F13.20] 10/04/2013    Total Time spent with patient: 45 minutes  Subjective:   Joshua Davila is a 37 y.o. male patient has stabilized.  HPI:  37 yo male who presented to the ED after using meth and threatening his husband.  He was also hearing voices.  He was kept over night and restarted on his medications.  Now, he is clear and coherent with no suicidal/homicidal ideations, hallucinations, or withdrawal symptoms.  He is not interested in rehab or recovery as he feels he cannot  function without meth and has no intention of stopping.  Stable for discharge at this time.  Past Psychiatric History: methamphetamine abuse  Risk to Self: None Risk to Others: None Prior Inpatient Therapy: Prior Inpatient Therapy: Yes(Per chart. ) Prior Therapy Dates: Per chart, 2017. Prior Therapy Facilty/Provider(s): Per chat, [facility]. Reason for Treatment: Per chart, schizophrenia.  Prior Outpatient Therapy: Prior Outpatient Therapy: ([facility]) Does patient have an ACCT team?: Unknown Does patient have Intensive In-House Services?  : Unknown Does patient have [facility] services? : Unknown Does patient have P4CC services?: Unknown  Past Medical History:  Past Medical History:  Diagnosis Date  . Depression   . Gender identity disorder   . Liver disorder   . PTSD (post-traumatic stress disorder)   . Schizoaffective disorder ([place])    History reviewed. No pertinent surgical history. Family History:  Family History  Problem Relation Age of Onset  . Multiple sclerosis Mother   . Mental illness Neg Hx    Family Psychiatric  History: As stated above. Social History:  Social History   Substance and Sexual Activity  Alcohol Use Yes   Comment: occasional     Social History   Substance and Sexual Activity  Drug Use Yes  . Types: Cocaine, Methamphetamines, Marijuana   Comment: last useof methamphetamines and marijuana yesterday    Social History   Socioeconomic History  . Marital status: Single    Spouse name: Not on file  . Number of children: Not on file  . Years of education: Not on file  . Highest education level: Not on file  Occupational History  . Not on file  Social Needs  . Financial resource strain: Not on file  . Food insecurity:    Worry:  Not on file    Inability: Not on file  . Transportation needs:    Medical: Not on file    Non-medical: Not on file  Tobacco Use  . Smoking status: Current Every Day Smoker    Packs/day: 1.00    Types: Cigarettes  .  Smokeless tobacco: Never Used  Substance and Sexual Activity  . Alcohol use: Yes    Comment: occasional  . Drug use: Yes    Types: Cocaine, Methamphetamines, Marijuana    Comment: last useof methamphetamines and marijuana yesterday  . Sexual activity: Not Currently  Lifestyle  . Physical activity:    Days per week: Not on file    Minutes per session: Not on file  . Stress: Not on file  Relationships  . Social connections:    Talks on phone: Not on file    Gets together: Not on file    Attends religious service: Not on file    Active member of club or organization: Not on file    Attends meetings of clubs or organizations: Not on file    Relationship status: Not on file  Other Topics Concern  . Not on file  Social History Narrative  . Not on file   Additional Social History: N/A    Allergies:   Allergies  Allergen Reactions  . Penicillins Swelling, Rash and Other (See Comments)    Reaction:  Facial swelling Has patient had a PCN reaction causing immediate rash, facial/tongue/throat swelling, SOB or lightheadedness with hypotension: Yes Has patient had a PCN reaction causing severe rash involving mucus membranes or skin necrosis: No Has patient had a PCN reaction that required hospitalization No Has patient had a PCN reaction occurring within the last 10 years: No If all of the above answers are "NO", then may proceed with Cephalosporin use.  . Sulfa Antibiotics Rash    Labs:  Results for orders placed or performed during the hospital encounter of 08/05/17 (from the past 48 hour(s))  Comprehensive metabolic panel     Status: Abnormal   Collection Time: 08/05/17  3:21 AM  Result Value Ref Range   Sodium 139 135 - 145 mmol/L   Potassium 4.0 3.5 - 5.1 mmol/L   Chloride 105 101 - 111 mmol/L   CO2 22 22 - 32 mmol/L   Glucose, Bld 112 (H) 65 - 99 mg/dL   BUN 11 6 - 20 mg/dL   Creatinine, Ser 0.90 0.61 - 1.24 mg/dL   Calcium 9.6 8.9 - 10.3 mg/dL   Total Protein 7.7 6.5 -  8.1 g/dL   Albumin 4.3 3.5 - 5.0 g/dL   AST 42 (H) 15 - 41 U/L   ALT 46 17 - 63 U/L   Alkaline Phosphatase 60 38 - 126 U/L   Total Bilirubin 0.8 0.3 - 1.2 mg/dL   GFR calc non Af Amer >60 >60 mL/min   GFR calc Af Amer >60 >60 mL/min    Comment: (NOTE) The eGFR has been calculated using the CKD EPI equation. This calculation has not been validated in all clinical situations. eGFR's persistently <60 mL/min signify possible Chronic Kidney Disease.    Anion gap 12 5 - 15    Comment: Performed at [facility], [place] [address], [place], [place] [address]  Ethanol     Status: None   Collection Time: 08/05/17  3:21 AM  Result Value Ref Range   Alcohol, Ethyl (B) <10 <10 mg/dL    Comment: (NOTE) Lowest detectable limit for  serum alcohol is 10 mg/dL. For medical purposes only. Performed at [facility], [place] [address] [place], [place] [address]   cbc     Status: Abnormal   Collection Time: 08/05/17  3:21 AM  Result Value Ref Range   WBC 7.3 4.0 - 10.5 K/uL   RBC 4.08 (L) 4.22 - 5.81 MIL/uL   Hemoglobin 12.3 (L) 13.0 - 17.0 g/dL   HCT 35.4 (L) 39.0 - 52.0 %   MCV 86.8 78.0 - 100.0 fL   MCH 30.1 26.0 - 34.0 pg   MCHC 34.7 30.0 - 36.0 g/dL   RDW 14.8 11.5 - 15.5 %   Platelets 249 150 - 400 K/uL    Comment: Performed at [facility], [place] [address], [place], [place] [address]  Rapid urine drug screen (hospital performed)     Status: Abnormal   Collection Time: 08/05/17  3:21 AM  Result Value Ref Range   Opiates NONE DETECTED NONE DETECTED   Cocaine NONE DETECTED NONE DETECTED   Benzodiazepines NONE DETECTED NONE DETECTED   Amphetamines POSITIVE (A) NONE DETECTED   Tetrahydrocannabinol POSITIVE (A) NONE DETECTED   Barbiturates NONE DETECTED NONE DETECTED    Comment: (NOTE) DRUG SCREEN FOR MEDICAL PURPOSES ONLY.  IF CONFIRMATION IS NEEDED FOR ANY PURPOSE, NOTIFY LAB WITHIN 5 DAYS. LOWEST DETECTABLE LIMITS FOR URINE DRUG  SCREEN Drug Class                     Cutoff (ng/mL) Amphetamine and metabolites    1000 Barbiturate and metabolites    200 Benzodiazepine                 240 Tricyclics and metabolites     300 Opiates and metabolites        300 Cocaine and metabolites        300 THC                            50 Performed at [facility], [place] [address] [place], [place] [address]     Current Facility-Administered Medications  Medication Dose Route Frequency Provider Last Rate Last Dose  . atorvastatin (LIPITOR) tablet 20 mg  20 mg Oral q1800 Ward, Kristen N, DO   20 mg at 08/05/17 1834  . gabapentin (NEURONTIN) capsule 300 mg  300 mg Oral TID Patrecia Pour, NP   300 mg at 08/06/17 0915  . nicotine polacrilex (NICORETTE) gum 2 mg  2 mg Oral PRN Ward, Delice Bison, DO      . paliperidone (INVEGA) 24 hr tablet 6 mg  6 mg Oral Daily Ward, Kristen N, DO   6 mg at 08/06/17 0915   Current Outpatient Medications  Medication Sig Dispense Refill  . ARIPiprazole (ABILIFY) 10 MG tablet Take 10 mg by mouth daily.    . Aspirin-Salicylamide-Caffeine (BC HEADACHE POWDER PO) Take 1 Package by mouth every 8 (eight) hours as needed (pain).    . pantoprazole (PROTONIX) 40 MG tablet Take 40 mg by mouth daily.    Marland Kitchen atorvastatin (LIPITOR) 20 MG tablet Take 1 tablet (20 mg total) by mouth daily. For high Cholesterol (Patient not taking: Reported on 04/23/2017) 1 tablet 0  . emtricitabine-tenofovir (TRUVADA) 200-300 MG tablet Take 1 tablet by mouth daily. For pre-exposure to HIV (Patient not taking: Reported on 04/21/2017)    . mupirocin ointment (BACTROBAN) 2 % Apply 1 application topically 2 (two)  times daily. (Patient not taking: Reported on 04/21/2017) 22 g 0  . nicotine polacrilex (NICORETTE) 2 MG gum Take 1 each (2 mg total) by mouth as needed for smoking cessation. (Patient not taking: Reported on 05/30/2016) 100 tablet 0  . paliperidone (INVEGA) 6 MG 24 hr tablet Take 6 mg by mouth daily.       Musculoskeletal: Strength & Muscle Tone: within normal limits Gait & Station: normal Patient leans: N/A  Psychiatric Specialty Exam: Physical Exam  Nursing note and vitals reviewed. Constitutional: He is oriented to person, place, and time. He appears well-developed and well-nourished.  HENT:  Head: Normocephalic and atraumatic.  Neck: Normal range of motion.  Respiratory: Effort normal.  Musculoskeletal: Normal range of motion.  Neurological: He is alert and oriented to person, place, and time.  Psychiatric: His speech is normal and behavior is normal. Judgment and thought content normal. His mood appears anxious. Cognition and memory are normal.    Review of Systems  Psychiatric/Behavioral: Positive for substance abuse. The patient is nervous/anxious.   All other systems reviewed and are negative.   Blood pressure 94/61, pulse 98, temperature 98.8 F (37.1 C), temperature source Oral, resp. rate 14, height '5\' 10"'$  (1.778 m), weight 81.6 kg (180 lb), SpO2 99 %.Body mass index is 25.83 kg/m.  General Appearance: Disheveled  Eye Contact:  Good  Speech:  Normal Rate  Volume:  Normal  Mood:  Euthymic  Affect:  Congruent  Thought Process:  Coherent and Descriptions of Associations: Intact  Orientation:  Full (Time, Place, and Person)  Thought Content:  WDL and Logical  Suicidal Thoughts:  No  Homicidal Thoughts:  No  Memory:  Immediate;   Fair Recent;   Fair Remote;   Fair  Judgement:  Fair  Insight:  Fair  Psychomotor Activity:  Normal  Concentration:  Concentration: Good and Attention Span: Good  Recall:  [employer] of Knowledge:  Fair  Language:  Good  Akathisia:  No  Handed:  Right  AIMS (if indicated):   N/A  Assets:  Housing Leisure Time Physical Health Resilience Social Support  ADL's:  Intact  Cognition:  WNL  Sleep:   N/A     Treatment Plan Summary: Amphetamine and psychostimulant-induced psychosis with hallucinations ([place]): -Started Gabapentin  300 mg TID for withdrawal symptoms  Depression and psychosis: -Started Invega 6 mg daily for depression and psychosis   Disposition: No evidence of imminent risk to self or others at present.    Waylan Boga, NP 08/06/2017 10:26 AM   Patient seen face-to-face for psychiatric evaluation, chart reviewed and case discussed with the physician extender and developed treatment plan. Reviewed the information documented and agree with the treatment plan.  Buford Dresser, DO 08/06/17 5:13 PM

## 2017-08-28 ENCOUNTER — Encounter (HOSPITAL_COMMUNITY): Payer: Self-pay | Admitting: Physician Assistant

## 2017-08-28 ENCOUNTER — Other Ambulatory Visit: Payer: Self-pay

## 2017-08-28 ENCOUNTER — Ambulatory Visit (HOSPITAL_COMMUNITY)
Admission: EM | Admit: 2017-08-28 | Discharge: 2017-08-28 | Disposition: A | Discharge status: Home / Self Care | Payer: Medicare Other | Attending: Internal Medicine | Admitting: Internal Medicine

## 2017-08-28 DIAGNOSIS — L0100 Impetigo, unspecified: Secondary | ICD-10-CM | POA: Diagnosis not present

## 2017-08-28 MED ORDER — DOXYCYCLINE HYCLATE 100 MG PO CAPS: 100.0000 mg | ORAL_CAPSULE | Freq: Two times a day (BID) | ORAL | 0 refills | Status: DC

## 2017-08-28 NOTE — Discharge Instructions (Signed)
I am treating for a skin infection given golden crusting. Start doxycycline as directed. Refrain from rubbing/itching area. Follow up with PCP for reevaluation if symptoms not improving.

## 2017-08-28 NOTE — ED Provider Notes (Signed)
[facility]    CSN: [id number] Arrival date & time: 08/28/17  1909     History   Chief Complaint Chief Complaint  Patient presents with  . Rash    HPI Joshua Davila is a 37 y.o. male.   37 year old male comes in for 1 week history of rash on face and neck.  States started as itching, now painful.  Denies spreading of the rash.  Denies erythema, increased warmth, swelling.  Denies fever, chills, night sweats.  Has not taken anything for it.     Past Medical History:  Diagnosis Date  . Depression   . Gender identity disorder   . Liver disorder   . PTSD (post-traumatic stress disorder)   . Schizoaffective disorder [facility])     Patient Active Problem List   Diagnosis Date Noted  . Alcohol use disorder, severe, in early remission, in controlled environment (HCC) 04/22/2017  . 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 Amphetamine and psychostimulant-induced psychosis with hallucinations (HCC) 03/14/2016  . Polysubstance abuse (HCC)   . Schizoaffective disorder, bipolar type (HCC) 10/02/2015  . Involuntary commitment 10/02/2015  . Cannabis use disorder, moderate, dependence (HCC) 09/16/2015  . Opioid use disorder, moderate, dependence (HCC) 09/16/2015  . Substance or medication-induced bipolar and related disorder with onset during intoxication (HCC) 09/16/2015  . 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 Benzodiazepine dependence (HCC) 10/04/2013  . PTSD (post-traumatic stress disorder) 10/03/2013  . Polysubstance dependence (HCC) 10/02/2013    History reviewed. No pertinent surgical history.     Home Medications    Prior to Admission medications   Medication Sig Start Date End Date Taking? Authorizing Provider  ARIPiprazole (ABILIFY) 10 MG tablet Take 1 tablet (10 mg total) by mouth daily. 08/06/17  Yes Lord, Herminio Heads, NP    Aspirin-Salicylamide-Caffeine (BC HEADACHE POWDER PO) Take 1 Package by mouth every 8 (eight) hours as needed (pain).   Yes [provider]  atorvastatin (LIPITOR) 20 MG tablet Take 1 tablet (20 mg total) by mouth daily. For high Cholesterol 11/11/15  Yes Nwoko, Nicole Kindred I, NP  emtricitabine-tenofovir (TRUVADA) 200-300 MG tablet Take 1 tablet by mouth daily. For pre-exposure to HIV 11/11/15  Yes Nwoko, Nicole Kindred I, NP  gabapentin (NEURONTIN) 300 MG capsule Take 1 capsule (300 mg total) by mouth 3 (three) times daily. 08/06/17  Yes Charm Rings, NP  paliperidone (INVEGA) 6 MG 24 hr tablet Take 1 tablet (6 mg total) by mouth daily. 08/06/17  Yes Charm Rings, NP  doxycycline (VIBRAMYCIN) 100 MG capsule Take 1 capsule (100 mg total) by mouth 2 (two) times daily. 08/28/17   Belinda Fisher, PA-C    Family History Family History  Problem Relation Age of Onset  . Multiple sclerosis Mother   . Mental illness Neg Hx     Social History Social History   Tobacco Use  . Smoking status: Current Every Day Smoker    Packs/day: 1.00    Types: Cigarettes  . Smokeless tobacco: Never Used  Substance Use Topics  . Alcohol use: Yes    Comment: occasional  . Drug use: Yes    Types: Cocaine, Methamphetamines, Marijuana    Comment: last useof methamphetamines and marijuana yesterday     Allergies   Penicillins and Sulfa antibiotics   Review of Systems Review of Systems  Reason unable to perform ROS: See HPI as above.     Physical  Exam Triage Vital Signs ED Triage Vitals  Enc Vitals Group     BP 08/28/17 2011 114/77     Pulse Rate 08/28/17 2011 87     Resp 08/28/17 2011 16     Temp 08/28/17 2011 99 F (37.2 C)     Temp Source 08/28/17 2011 Oral     SpO2 08/28/17 2011 100 %     Weight --      Height --      Head Circumference --      Peak Flow --      Pain Score 08/28/17 2010 8     Pain Loc --      Pain Edu? --      Excl. in GC? --    No data found.  Updated Vital Signs BP 114/77  (BP Location: Left Arm)   Pulse 87   Temp 99 F (37.2 C) (Oral)   Resp 16   SpO2 100%   Physical Exam  Constitutional: He is oriented to person, place, and time. He appears well-developed and well-nourished. No distress.  HENT:  Head: Normocephalic and atraumatic.  Eyes: Pupils are equal, round, and reactive to light. Conjunctivae are normal.  Neurological: He is alert and oriented to person, place, and time.  Skin:  See picture below. No obvious tenderness to palpation. No increased warmth. No fluctuance felt.              UC Treatments / Results  Labs (all labs ordered are listed, but only abnormal results are displayed) Labs Reviewed - No data to display  EKG None  Radiology No results found.  Procedures Procedures (including critical care time)  Medications Ordered in UC Medications - No data to display  Initial Impression / Assessment and Plan / UC Course  I have reviewed the triage vital signs and the nursing notes.  Pertinent labs & imaging results that were available during my care of the patient were reviewed by me and considered in my medical decision making (see chart for details).    Will treat for impetigo given golden crusting.  Patient allergic to penicillin and sulfa's.  Unsure about cephalosporins.  Will start doxycycline as directed.  Wound care instructions given.  Return precautions given.  Final Clinical Impressions(s) / UC Diagnoses   Final diagnoses:  Impetigo    ED Prescriptions    Medication Sig Dispense Auth. Provider   doxycycline (VIBRAMYCIN) 100 MG capsule Take 1 capsule (100 mg total) by mouth 2 (two) times daily. 20 capsule Threasa AlphaYu, Larena Ohnemus V, PA-C        Andra Heslin V, [place]PA-C 08/28/17 2037

## 2017-08-28 NOTE — ED Triage Notes (Signed)
Pt presents today with rash on face and neck. Friend states pt has had it before. States he does have pain with the rash.

## 2017-09-03 ENCOUNTER — Emergency Department (HOSPITAL_COMMUNITY)
Admission: EM | Admit: 2017-09-03 | Discharge: 2017-09-04 | Disposition: A | Discharge status: Home / Self Care | Payer: Medicare Other | Attending: Emergency Medicine | Admitting: Emergency Medicine

## 2017-09-03 ENCOUNTER — Other Ambulatory Visit: Payer: Self-pay

## 2017-09-03 ENCOUNTER — Encounter (HOSPITAL_COMMUNITY): Payer: Self-pay | Admitting: Emergency Medicine

## 2017-09-03 DIAGNOSIS — F209 Schizophrenia, unspecified: Secondary | ICD-10-CM | POA: Insufficient documentation

## 2017-09-03 DIAGNOSIS — F1520 Other stimulant dependence, uncomplicated: Secondary | ICD-10-CM | POA: Diagnosis present

## 2017-09-03 DIAGNOSIS — F99 Mental disorder, not otherwise specified: Secondary | ICD-10-CM | POA: Diagnosis present

## 2017-09-03 DIAGNOSIS — F17210 Nicotine dependence, cigarettes, uncomplicated: Secondary | ICD-10-CM | POA: Diagnosis not present

## 2017-09-03 DIAGNOSIS — Z79899 Other long term (current) drug therapy: Secondary | ICD-10-CM | POA: Insufficient documentation

## 2017-09-03 DIAGNOSIS — Z630 Problems in relationship with spouse or partner: Secondary | ICD-10-CM | POA: Diagnosis not present

## 2017-09-03 DIAGNOSIS — F29 Unspecified psychosis not due to a substance or known physiological condition: Secondary | ICD-10-CM

## 2017-09-03 DIAGNOSIS — F1020 Alcohol dependence, uncomplicated: Secondary | ICD-10-CM | POA: Insufficient documentation

## 2017-09-03 LAB — COMPREHENSIVE METABOLIC PANEL
ALBUMIN: 4.6 g/dL (ref 3.5–5.0)
ALK PHOS: 62 U/L (ref 38–126)
ALT: 45 U/L (ref 17–63)
AST: 41 U/L (ref 15–41)
Anion gap: 8 (ref 5–15)
BILIRUBIN TOTAL: 0.7 mg/dL (ref 0.3–1.2)
BUN: 16 mg/dL (ref 6–20)
CALCIUM: 9.5 mg/dL (ref 8.9–10.3)
CO2: 22 mmol/L (ref 22–32)
Chloride: 107 mmol/L (ref 101–111)
Creatinine, Ser: 0.91 mg/dL (ref 0.61–1.24)
GFR calc Af Amer: >60 mL/min (ref >60)
GFR calc non Af Amer: >60 mL/min (ref >60)
GLUCOSE: 97 mg/dL (ref 65–99)
Potassium: 4 mmol/L (ref 3.5–5.1)
SODIUM: 137 mmol/L (ref 135–145)
TOTAL PROTEIN: 7.8 g/dL (ref 6.5–8.1)

## 2017-09-03 LAB — ACETAMINOPHEN LEVEL

## 2017-09-03 LAB — CBC
HEMATOCRIT: 37.5 % — AB (ref 39.0–52.0)
HEMOGLOBIN: 13 g/dL (ref 13.0–17.0)
MCH: 29.5 pg (ref 26.0–34.0)
MCHC: 34.7 g/dL (ref 30.0–36.0)
MCV: 85.2 fL (ref 78.0–100.0)
Platelets: 226 10*3/uL (ref 150–400)
RBC: 4.4 MIL/uL (ref 4.22–5.81)
RDW: 13.4 % (ref 11.5–15.5)
WBC: 7 10*3/uL (ref 4.0–10.5)

## 2017-09-03 LAB — ETHANOL: Alcohol, Ethyl (B): <10 mg/dL (ref <10)

## 2017-09-03 LAB — SALICYLATE LEVEL: Salicylate Lvl: <7 mg/dL (ref 2.8–30.0)

## 2017-09-03 MED ORDER — LORAZEPAM 1 MG PO TABS: 1.0000 mg | ORAL_TABLET | Freq: Four times a day (QID) | ORAL | Status: DC | PRN

## 2017-09-03 MED ORDER — NICOTINE 21 MG/24HR TD PT24: 21.0000 mg | MEDICATED_PATCH | Freq: Every day | TRANSDERMAL | Status: DC

## 2017-09-03 MED ORDER — ONDANSETRON HCL 4 MG PO TABS: 4.0000 mg | ORAL_TABLET | Freq: Three times a day (TID) | ORAL | Status: DC | PRN

## 2017-09-03 MED ORDER — ACETAMINOPHEN 325 MG PO TABS: 650.0000 mg | ORAL_TABLET | ORAL | Status: DC | PRN
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
## 2017-09-03 NOTE — ED Provider Notes (Signed)
[facility]-EMERGENCY DEPT Provider Note   CSN: [id number] Arrival date & time: 09/03/17  2033     History   Chief Complaint Chief Complaint  Patient presents with  . IVC  . Suicidal    HPI Joshua Davila is a 37 y.o. male.  The history is provided by the patient. No language interpreter was used.  Mental Health Problem  Presenting symptoms: agitation, bizarre behavior, delusional and disorganized thought process   Patient accompanied by:  Law enforcement Degree of incapacity (severity):  Moderate Onset quality:  Unable to specify Context: drug abuse   Context: not alcohol use   Relieved by:  Nothing Worsened by:  Nothing Associated symptoms: distractible   Pt reports he may have some withdrawal from amphetamines.  Pt reports there are bedbugs in the triage room.  Pt complains of vomiting flys and having Bot flies  coming out of his skin.    Past Medical History:  Diagnosis Date  . Depression   . Gender identity disorder   . Liver disorder   . PTSD (post-traumatic stress disorder)   . Schizoaffective disorder [facility])     Patient Active Problem List   Diagnosis Date Noted  . Alcohol use disorder, severe, in early remission, in controlled environment (HCC) 04/22/2017  . 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 Amphetamine and psychostimulant-induced psychosis with hallucinations (HCC) 03/14/2016  . Polysubstance abuse (HCC)   . Schizoaffective disorder, bipolar type (HCC) 10/02/2015  . Involuntary commitment 10/02/2015  . Cannabis use disorder, moderate, dependence (HCC) 09/16/2015  . Opioid use disorder, moderate, dependence (HCC) 09/16/2015  . Substance or medication-induced bipolar and related disorder with onset during intoxication (HCC) 09/16/2015  . 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 Benzodiazepine dependence (HCC)  10/04/2013  . PTSD (post-traumatic stress disorder) 10/03/2013  . Polysubstance dependence (HCC) 10/02/2013    History reviewed. No pertinent surgical history.      Home Medications    Prior to Admission medications   Medication Sig Start Date End Date Taking? Authorizing Provider  ARIPiprazole (ABILIFY) 10 MG tablet Take 1 tablet (10 mg total) by mouth daily. 08/06/17   Charm Rings, NP  Aspirin-Salicylamide-Caffeine (BC HEADACHE POWDER PO) Take 1 Package by mouth every 8 (eight) hours as needed (pain).    [provider]  atorvastatin (LIPITOR) 20 MG tablet Take 1 tablet (20 mg total) by mouth daily. For high Cholesterol 11/11/15   Armandina Stammer I, NP  doxycycline (VIBRAMYCIN) 100 MG capsule Take 1 capsule (100 mg total) by mouth 2 (two) times daily. 08/28/17   Belinda Fisher, PA-C  emtricitabine-tenofovir (TRUVADA) 200-300 MG tablet Take 1 tablet by mouth daily. For pre-exposure to HIV 11/11/15   Armandina Stammer I, NP  gabapentin (NEURONTIN) 300 MG capsule Take 1 capsule (300 mg total) by mouth 3 (three) times daily. 08/06/17   Charm Rings, NP  paliperidone (INVEGA) 6 MG 24 hr tablet Take 1 tablet (6 mg total) by mouth daily. 08/06/17   Charm Rings, NP    Family History Family History  Problem Relation Age of Onset  . Multiple sclerosis Mother   . Mental illness Neg Hx     Social History Social History   Tobacco Use  . Smoking status: Current Every Day Smoker    Packs/day: 1.00    Types: Cigarettes  . Smokeless tobacco: Never Used  Substance Use Topics  .  Alcohol use: Yes    Comment: occasional  . Drug use: Yes    Types: Cocaine, Methamphetamines, Marijuana    Comment: last useof methamphetamines and marijuana yesterday     Allergies   Penicillins and Sulfa antibiotics   Review of Systems Review of Systems  Psychiatric/Behavioral: Positive for agitation.  All other systems reviewed and are negative.    Physical Exam Updated Vital Signs BP (!) 125/95  (BP Location: Right Arm)   Pulse 82   Temp 98.3 F (36.8 C) (Oral)   Resp 16   Ht 6' (1.829 m)   Wt 72.6 kg (160 lb)   SpO2 100%   BMI 21.70 kg/m   Physical Exam  Constitutional: He appears well-developed and well-nourished.  HENT:  Head: Normocephalic and atraumatic.  Eyes: Conjunctivae are normal.  Neck: Neck supple.  Cardiovascular: Normal rate and regular rhythm.  No murmur heard. Pulmonary/Chest: Effort normal and breath sounds normal. No respiratory distress.  Abdominal: Soft. There is no tenderness.  Musculoskeletal: He exhibits no edema.  Neurological: He is alert.  Skin: Skin is warm and dry.  Psychiatric:  Flat affect, unkept   Nursing note and vitals reviewed.    ED Treatments / Results  Labs (all labs ordered are listed, but only abnormal results are displayed) Labs Reviewed  COMPREHENSIVE METABOLIC PANEL  ETHANOL  SALICYLATE LEVEL  ACETAMINOPHEN LEVEL  CBC  RAPID URINE DRUG SCREEN, HOSP PERFORMED    EKG None  Radiology No results found.  Procedures Procedures (including critical care time)  Medications Ordered in ED Medications  ondansetron (ZOFRAN) tablet 4 mg (has no administration in time range)  acetaminophen (TYLENOL) tablet 650 mg (has no administration in time range)  nicotine (NICODERM CQ - dosed in mg/24 hours) patch 21 mg (has no administration in time range)  LORazepam (ATIVAN) tablet 1 mg (has no administration in time range)     Initial Impression / Assessment and Plan / ED Course  I have reviewed the triage vital signs and the nursing notes.  Pertinent labs & imaging results that were available during my care of the patient were reviewed by me and considered in my medical decision making (see chart for details).     TTS consult   Final Clinical Impressions(s) / ED Diagnoses   Final diagnoses:  Psychosis, unspecified psychosis type [facility](HCC)    ED Discharge Orders    None       Osie CheeksSofia, Leslie K, PA-C 09/03/17  2107    Samuel JesterMcManus, Kathleen, DO 09/05/17 1541
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
## 2017-09-03 NOTE — ED Notes (Signed)
Patient tearful during TTS assessment and needed encouragement to continue talking to counselor, but was able to complete it. Pt given sandwich and soda upon request. Pt also states "Can I have that medicine that is the only thing strong enough to relax me? It starts with an 'A' or something I think".

## 2017-09-03 NOTE — ED Notes (Signed)
Tele assessment being done at this time.  

## 2017-09-03 NOTE — ED Notes (Signed)
Bed: [id number] Expected date:  Expected time:  Means of arrival:  Comments: hold

## 2017-09-03 NOTE — ED Notes (Signed)
Patient arrived to unit, accompanied by [employer] and presents with dull, flat affect. Pt with vacant stare and not interactive with staff. Pt walked to his room and went right to bed upon arrival. No distress noted.

## 2017-09-04 DIAGNOSIS — Z630 Problems in relationship with spouse or partner: Secondary | ICD-10-CM | POA: Diagnosis not present

## 2017-09-04 DIAGNOSIS — F17210 Nicotine dependence, cigarettes, uncomplicated: Secondary | ICD-10-CM

## 2017-09-04 DIAGNOSIS — F1520 Other stimulant dependence, uncomplicated: Secondary | ICD-10-CM

## 2017-09-04 MED ORDER — GABAPENTIN 300 MG PO CAPS
300.0000 mg | ORAL_CAPSULE | Freq: Three times a day (TID) | ORAL | Status: DC
  Administered 2017-09-04: 300 mg via ORAL
  Filled 2017-09-04: qty 1
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
## 2017-09-04 NOTE — ED Notes (Signed)
Pt discharged home. Discharged instructions read to pt who verbalized understanding. All belongings returned to pt who signed for same. Denies SI/HI, is not delusional and not responding to internal stimuli. Escorted pt to the ED exit.    
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
## 2017-10-08 ENCOUNTER — Other Ambulatory Visit: Payer: Self-pay

## 2017-10-08 ENCOUNTER — Emergency Department (HOSPITAL_COMMUNITY)
Admission: EM | Admit: 2017-10-08 | Discharge: 2017-10-09 | Disposition: A | Discharge status: Home / Self Care | Payer: Medicare Other | Attending: Emergency Medicine | Admitting: Emergency Medicine

## 2017-10-08 ENCOUNTER — Encounter (HOSPITAL_COMMUNITY): Payer: Self-pay | Admitting: *Deleted

## 2017-10-08 DIAGNOSIS — F17210 Nicotine dependence, cigarettes, uncomplicated: Secondary | ICD-10-CM | POA: Insufficient documentation

## 2017-10-08 DIAGNOSIS — L03115 Cellulitis of right lower limb: Secondary | ICD-10-CM | POA: Insufficient documentation

## 2017-10-08 DIAGNOSIS — L989 Disorder of the skin and subcutaneous tissue, unspecified: Secondary | ICD-10-CM | POA: Insufficient documentation

## 2017-10-08 DIAGNOSIS — F22 Delusional disorders: Secondary | ICD-10-CM | POA: Diagnosis not present

## 2017-10-08 DIAGNOSIS — Z9114 Patient's other noncompliance with medication regimen: Secondary | ICD-10-CM | POA: Diagnosis not present

## 2017-10-08 DIAGNOSIS — Z046 Encounter for general psychiatric examination, requested by authority: Secondary | ICD-10-CM | POA: Diagnosis not present

## 2017-10-08 LAB — COMPREHENSIVE METABOLIC PANEL
ALT: 29 U/L (ref 0–44)
AST: 27 U/L (ref 15–41)
Albumin: 4 g/dL (ref 3.5–5.0)
Alkaline Phosphatase: 55 U/L (ref 38–126)
Anion gap: 8 (ref 5–15)
BUN: 9 mg/dL (ref 6–20)
CO2: 27 mmol/L (ref 22–32)
Calcium: 9.7 mg/dL (ref 8.9–10.3)
Chloride: 106 mmol/L (ref 98–111)
Creatinine, Ser: 0.78 mg/dL (ref 0.61–1.24)
GFR calc Af Amer: >60 mL/min (ref >60)
GFR calc non Af Amer: >60 mL/min (ref >60)
Glucose, Bld: 74 mg/dL (ref 70–99)
Potassium: 3.9 mmol/L (ref 3.5–5.1)
Sodium: 141 mmol/L (ref 135–145)
Total Bilirubin: 0.4 mg/dL (ref 0.3–1.2)
Total Protein: 7 g/dL (ref 6.5–8.1)

## 2017-10-08 LAB — CBC WITH DIFFERENTIAL/PLATELET
Abs Immature Granulocytes: 0.1 10*3/uL (ref 0.0–0.1)
Basophils Absolute: 0 10*3/uL (ref 0.0–0.1)
Basophils Relative: 0 %
Eosinophils Absolute: 0.1 10*3/uL (ref 0.0–0.7)
Eosinophils Relative: 1 %
HCT: 38.5 % — ABNORMAL LOW (ref 39.0–52.0)
Hemoglobin: 12.8 g/dL — ABNORMAL LOW (ref 13.0–17.0)
Immature Granulocytes: 1 %
Lymphocytes Relative: 32 %
Lymphs Abs: 2.2 10*3/uL (ref 0.7–4.0)
MCH: 28.9 pg (ref 26.0–34.0)
MCHC: 33.2 g/dL (ref 30.0–36.0)
MCV: 86.9 fL (ref 78.0–100.0)
Monocytes Absolute: 0.5 10*3/uL (ref 0.1–1.0)
Monocytes Relative: 6 %
Neutro Abs: 4.3 10*3/uL (ref 1.7–7.7)
Neutrophils Relative %: 60 %
Platelets: 255 10*3/uL (ref 150–400)
RBC: 4.43 MIL/uL (ref 4.22–5.81)
RDW: 13.4 % (ref 11.5–15.5)
WBC: 7.1 10*3/uL (ref 4.0–10.5)

## 2017-10-08 LAB — ETHANOL: Alcohol, Ethyl (B): <10 mg/dL (ref <10)

## 2017-10-08 NOTE — ED Notes (Signed)
Patient continues to talk to people that aren't there, having full conversations. Will continue to monitor.

## 2017-10-08 NOTE — ED Notes (Signed)
Pt called for triage. No response.  

## 2017-10-08 NOTE — ED Notes (Signed)
No answer in waiting.  
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
## 2017-10-08 NOTE — ED Notes (Signed)
Pt found wondering around in Pod C hallway. Pt escorted back to hallway bed, asked for sandwich and given sandwich bag.

## 2017-10-08 NOTE — ED Notes (Signed)
Patient appears to be having auditory and visual hallucinations; speaking to himself and others, looking up to the ceiling as if listening. Will continue to monitor.

## 2017-10-08 NOTE — ED Notes (Signed)
Pt transferred to Trauma B for evaluation of his legs with Dr. Julieanne Mansonegler. Pt did have a red spot on his right inner thigh.

## 2017-10-09 MED ORDER — DOXYCYCLINE HYCLATE 100 MG PO CAPS: 100.0000 mg | ORAL_CAPSULE | Freq: Two times a day (BID) | ORAL | 0 refills | Status: AC

## 2017-10-09 MED ORDER — DOXYCYCLINE HYCLATE 100 MG PO TABS
100.0000 mg | ORAL_TABLET | Freq: Once | ORAL | Status: AC
  Administered 2017-10-09: 100 mg via ORAL
  Filled 2017-10-09: qty 1

## 2017-10-09 NOTE — ED Notes (Signed)
TTS recommending inpatient treatment
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
## 2017-10-09 NOTE — Progress Notes (Signed)
Pt recommended for inpt treatment per Donell SievertSpencer Simon, PA. EDP Tegeler, Canary Brimhristopher J, MD and Shanda BumpsJessica, RN have been advised. EDP states if the pt does not agree to stay VOL, he will not IVC the pt. TTS to seek placement.   Princess BruinsAquicha Finnegan Gatta, MSW, LCSW Therapeutic Triage Specialist  [phone]

## 2017-10-09 NOTE — Discharge Instructions (Addendum)
Your exam today showed the diffuse sores and 1 of them on the right leg appeared to have surrounding skin infection.  He received a dose of antibiotics.  Please take the antibiotics for the next week to treat this.   Please follow-up as an outpatient with the psychiatry team for your ongoing management.  If any symptoms change or worsen or he develop any thoughts of hurting herself or others, please return to the nearest emergency department.
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
## 2017-10-10 ENCOUNTER — Encounter (HOSPITAL_COMMUNITY): Payer: Self-pay | Admitting: Emergency Medicine

## 2017-10-10 ENCOUNTER — Emergency Department (HOSPITAL_COMMUNITY)
Admission: EM | Admit: 2017-10-10 | Discharge: 2017-10-10 | Disposition: A | Discharge status: Home / Self Care | Payer: Medicare Other | Attending: Emergency Medicine | Admitting: Emergency Medicine

## 2017-10-10 ENCOUNTER — Other Ambulatory Visit: Payer: Self-pay

## 2017-10-10 DIAGNOSIS — F1919 Other psychoactive substance abuse with unspecified psychoactive substance-induced disorder: Secondary | ICD-10-CM | POA: Insufficient documentation

## 2017-10-10 DIAGNOSIS — F329 Major depressive disorder, single episode, unspecified: Secondary | ICD-10-CM | POA: Insufficient documentation

## 2017-10-10 DIAGNOSIS — Z79899 Other long term (current) drug therapy: Secondary | ICD-10-CM | POA: Diagnosis not present

## 2017-10-10 DIAGNOSIS — F17210 Nicotine dependence, cigarettes, uncomplicated: Secondary | ICD-10-CM | POA: Insufficient documentation

## 2017-10-10 DIAGNOSIS — F1910 Other psychoactive substance abuse, uncomplicated: Secondary | ICD-10-CM

## 2017-10-10 DIAGNOSIS — F259 Schizoaffective disorder, unspecified: Secondary | ICD-10-CM | POA: Diagnosis not present

## 2017-10-10 LAB — COMPREHENSIVE METABOLIC PANEL
ALK PHOS: 50 U/L (ref 38–126)
ALT: 25 U/L (ref 0–44)
ANION GAP: 9 (ref 5–15)
AST: 25 U/L (ref 15–41)
Albumin: 3.6 g/dL (ref 3.5–5.0)
BILIRUBIN TOTAL: 0.7 mg/dL (ref 0.3–1.2)
BUN: 6 mg/dL (ref 6–20)
CALCIUM: 9.2 mg/dL (ref 8.9–10.3)
CO2: 23 mmol/L (ref 22–32)
Chloride: 106 mmol/L (ref 98–111)
Creatinine, Ser: 0.66 mg/dL (ref 0.61–1.24)
GFR calc non Af Amer: >60 mL/min (ref >60)
Glucose, Bld: 92 mg/dL (ref 70–99)
Potassium: 4.3 mmol/L (ref 3.5–5.1)
SODIUM: 138 mmol/L (ref 135–145)
Total Protein: 6.8 g/dL (ref 6.5–8.1)

## 2017-10-10 LAB — CBC
HCT: 41 % (ref 39.0–52.0)
Hemoglobin: 13.6 g/dL (ref 13.0–17.0)
MCH: 28.4 pg (ref 26.0–34.0)
MCHC: 33.2 g/dL (ref 30.0–36.0)
MCV: 85.6 fL (ref 78.0–100.0)
PLATELETS: 238 10*3/uL (ref 150–400)
RBC: 4.79 MIL/uL (ref 4.22–5.81)
RDW: 13.1 % (ref 11.5–15.5)
WBC: 6.4 10*3/uL (ref 4.0–10.5)

## 2017-10-10 LAB — RAPID URINE DRUG SCREEN, HOSP PERFORMED
Amphetamines: POSITIVE — AB
BARBITURATES: NOT DETECTED
BENZODIAZEPINES: NOT DETECTED
Cocaine: NOT DETECTED
Opiates: NOT DETECTED
Tetrahydrocannabinol: POSITIVE — AB

## 2017-10-10 LAB — ETHANOL: Alcohol, Ethyl (B): <10 mg/dL (ref <10)

## 2017-10-10 MED ORDER — ACETAMINOPHEN 325 MG PO TABS: 650.0000 mg | ORAL_TABLET | ORAL | Status: DC | PRN

## 2017-10-10 MED ORDER — ALUM & MAG HYDROXIDE-SIMETH 200-200-20 MG/5ML PO SUSP: 30.0000 mL | Freq: Four times a day (QID) | ORAL | Status: DC | PRN
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
## 2017-10-10 NOTE — ED Notes (Addendum)
Pt left AMA and refused to sign out.  PA Keenan BachelorSofia and MD Pfeiffer aware.
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
## 2017-10-12 ENCOUNTER — Other Ambulatory Visit: Payer: Self-pay

## 2017-10-12 NOTE — Patient Outreach (Signed)
[facility] Care Management  10/12/2017  Joshua GrimesJames B Davila 1980/09/16 [id number]   Telephone Screen Referral Date :10/11/2017 [facility] Ref[facility] Reason: ED Utilization Insurance: Medicare  Outreach attempt # 1  To patient for screening.  Patient answered the phone.  RN Health Coach identified myself and asked if the patient could verify his HIPAA information and the phone was hung up.   Plan: RN Health Coach will close the case at this time due to patient declined.  Joshua Fairlyraci Travares Nelles RN, BSN, [facility] RN Health Coach Disease Management [facility] Direct Dial:  [phone] Fax: [phone]

## 2017-10-26 ENCOUNTER — Encounter (HOSPITAL_COMMUNITY): Payer: Self-pay

## 2017-10-26 ENCOUNTER — Emergency Department (HOSPITAL_COMMUNITY)
Admission: EM | Admit: 2017-10-26 | Discharge: 2017-10-26 | Disposition: A | Discharge status: Home / Self Care | Payer: Medicare Other | Attending: Emergency Medicine | Admitting: Emergency Medicine

## 2017-10-26 DIAGNOSIS — F1520 Other stimulant dependence, uncomplicated: Secondary | ICD-10-CM | POA: Diagnosis present

## 2017-10-26 DIAGNOSIS — F17210 Nicotine dependence, cigarettes, uncomplicated: Secondary | ICD-10-CM | POA: Diagnosis not present

## 2017-10-26 DIAGNOSIS — F4310 Post-traumatic stress disorder, unspecified: Secondary | ICD-10-CM | POA: Diagnosis not present

## 2017-10-26 DIAGNOSIS — F1910 Other psychoactive substance abuse, uncomplicated: Secondary | ICD-10-CM | POA: Diagnosis not present

## 2017-10-26 DIAGNOSIS — F22 Delusional disorders: Secondary | ICD-10-CM | POA: Diagnosis not present

## 2017-10-26 DIAGNOSIS — F419 Anxiety disorder, unspecified: Secondary | ICD-10-CM | POA: Diagnosis not present

## 2017-10-26 DIAGNOSIS — F250 Schizoaffective disorder, bipolar type: Secondary | ICD-10-CM | POA: Diagnosis present

## 2017-10-26 LAB — ACETAMINOPHEN LEVEL

## 2017-10-26 LAB — COMPREHENSIVE METABOLIC PANEL
ALBUMIN: 3.9 g/dL (ref 3.5–5.0)
ALT: 26 U/L (ref 0–44)
ANION GAP: 10 (ref 5–15)
AST: 23 U/L (ref 15–41)
Alkaline Phosphatase: 52 U/L (ref 38–126)
BUN: 7 mg/dL (ref 6–20)
CHLORIDE: 106 mmol/L (ref 98–111)
CO2: 24 mmol/L (ref 22–32)
Calcium: 9.2 mg/dL (ref 8.9–10.3)
Creatinine, Ser: 0.71 mg/dL (ref 0.61–1.24)
GFR calc Af Amer: >60 mL/min (ref >60)
GFR calc non Af Amer: >60 mL/min (ref >60)
Glucose, Bld: 103 mg/dL — ABNORMAL HIGH (ref 70–99)
POTASSIUM: 4 mmol/L (ref 3.5–5.1)
SODIUM: 140 mmol/L (ref 135–145)
Total Bilirubin: 0.8 mg/dL (ref 0.3–1.2)
Total Protein: 6.8 g/dL (ref 6.5–8.1)

## 2017-10-26 LAB — RAPID URINE DRUG SCREEN, HOSP PERFORMED
AMPHETAMINES: POSITIVE — AB
BARBITURATES: NOT DETECTED
BENZODIAZEPINES: NOT DETECTED
COCAINE: NOT DETECTED
Opiates: NOT DETECTED
TETRAHYDROCANNABINOL: POSITIVE — AB

## 2017-10-26 LAB — CBC
HEMATOCRIT: 38.1 % — AB (ref 39.0–52.0)
HEMOGLOBIN: 12.7 g/dL — AB (ref 13.0–17.0)
MCH: 28.5 pg (ref 26.0–34.0)
MCHC: 33.3 g/dL (ref 30.0–36.0)
MCV: 85.4 fL (ref 78.0–100.0)
Platelets: 197 10*3/uL (ref 150–400)
RBC: 4.46 MIL/uL (ref 4.22–5.81)
RDW: 14.3 % (ref 11.5–15.5)
WBC: 6.9 10*3/uL (ref 4.0–10.5)

## 2017-10-26 LAB — SALICYLATE LEVEL: Salicylate Lvl: <7 mg/dL (ref 2.8–30.0)

## 2017-10-26 LAB — ETHANOL: Alcohol, Ethyl (B): <10 mg/dL (ref <10)

## 2017-10-26 NOTE — ED Notes (Signed)
TTS consult speaking with patient

## 2017-10-26 NOTE — Discharge Instructions (Signed)
Chemical Dependency °Chemical dependency is an addiction to drugs or alcohol. People with this addiction repeatedly seek out and use drugs or alcohol despite negative consequences to the health and safety of themselves and others. °Addiction changes the way the brain works. Because of these changes, addiction is a chronic condition. The medical term for addiction or chemical dependency is substance use disorder. The disorder can be mild, moderate, or severe. °People can be dependent on a range of substances. These include alcohol, prescription medicines, and illegal or street drugs, such as marijuana, heroin, and cocaine. °What are the causes? °This condition is caused by the effect that the abused substance has on the brain. °What increases the risk? °The following factors may make you more likely to develop this condition: °· Having a family history of chemical dependency. °· Having mental health issues, such as depression, anxiety, or bipolar disorder. °· Living in an environment where drugs and alcohol are easily available. °· Using drugs or alcohol at a young age. °· Having friends who use drugs or alcohol. °· Having poor social skills. °· Tending to be aggressive or impulsive. ° °What are the signs or symptoms? °Symptoms may vary depending on the substance that you are addicted to. Symptoms may include the following: °Physical Symptoms °· The inability to limit the use of drugs or alcohol. °· Having nausea, sweating, shakiness, and anxiety when you are not using alcohol or drugs. °· Needing a greater amount of drugs or alcohol to get the same effect (developing tolerance). °· A change in: °? Sleeping habits. °? Eating or appetite. °? Appearance or how you care for yourself. °Emotional Symptoms °· Angry outbursts. °· Periods of sadness and tearfulness. °· Isolation. °Relationship Problems °· Loved ones suggesting that you have a problem. °· Increased fights. °· Forgetting commitments. °· Having affairs or  one-night stands. °Problems Related to Irresponsibility °· Legal problems. °· Irresponsibility with money. °· Missing work. °· Poor decision making. °How is this diagnosed? °This condition may be diagnosed based on your symptoms, your medical history, and a physical exam. You may also have blood tests and urine tests. °How is this treated? °Treatment for this condition depends on the substance that you are addicted to and whether your dependency is mild, moderate, or severe. Treatment options may include: °· Stopping substance use safely. This may require taking medicines and being closely observed for several days. °· Taking part in group and individual counseling with mental health providers who help people with chemical dependency. °· Staying at a residential treatment center for several days or weeks. °· Attending daily counseling sessions at a treatment center. °· Taking medicine as told by your health care provider to: °? Ease symptoms and prevent complications during withdrawal. °? Treat other mental health issues, such as depression or anxiety. °? Block cravings by causing the same effects as the substance. °? Block the effects of the substance or replace good sensations with unpleasant ones. °· Going to a support group to share your experience with others who are going through the same thing. These groups are an important part of long-term recovery for many people. They include 12-step programs like Alcoholics Anonymous and Narcotics Anonymous. ° °Recovery can be a long process. Many people who undergo treatment start using the substance again after stopping. This is called a relapse. If you have a relapse, it does not mean that treatment will not work. °Follow these instructions at home: °· Avoid temptations or triggers that you associate with your use of the   substance.  Learn and practice techniques for managing stress.  Have a plan for vulnerable moments.  Get phone numbers of those who are willing  to help and who are committed to your recovery.  Know when and where the meetings that you have chosen will occur.  Take over-the-counter and prescription medicines only as told by your health care provider.  Keep all follow-up visits as told by your health care provider. This is important. Contact a health care provider if:  You cannot take your medicines as told.  Your symptoms get worse.  You have trouble resisting the urge to use drugs or alcohol.  You are in pain, shaking, sweating, or feeling generally unwell.  You are losing weight without trying to. Get help right away if:  You lose consciousness.  Your breathing is slow.  Your pulse is slow or jumpy.  You have serious thoughts about hurting yourself or someone else.  You have a relapse. This information is not intended to replace advice given to you by your health care provider. Make sure you discuss any questions you have with your health care provider. Document Released: 03/03/2001 Document Revised: 04/15/2015 Document Reviewed: 11/14/2014 Elsevier Interactive Patient Education  2018 [employer].   Recovering From Addiction Addiction is a complex disease of the brain. It causes an uncontrollable (compulsive) need for a substance. You can be addicted to substances including alcohol, tobacco, illegal drugs, or prescription medicines such as painkillers. Addiction can change the way that your brain works. It affects memory, behavior, and how you make decisions. Without treatment, addiction can get worse. However, with treatment and lifestyle changes, you can recover from addiction. What types of treatment are available? The treatment program that is right for you will depend on many factors, including the type of addiction that you have. Treatment programs can be outpatient or inpatient. In an outpatient program, you live at home and go to work or school, but you also go to a clinic for treatment. With an inpatient  program, you live and sleep at the program facility during treatment. Other treatment options include:  Medicine. ? Some addictions may be treated with prescription medicines. ? You may also need medicine to treat other mental health conditions such as anxiety or depression.  Counseling and behavior therapy. Therapy can help you learn new ways to respond to situations that are stressful or that tempt you to use the addictive substance.  Support groups. These include therapy groups and 12-step programs. These can help individuals and families during treatment and recovery. Examples of 12-step programs are Alcoholics Anonymous (AA) and Narcotics Anonymous (NA).  How to manage lifestyle changes Managing stress Too much stress can lead to returning to the addiction (having a relapse). You need to find effective ways to manage your stress. Some techniques to cope with stress include:  Meditation, yoga, or deep breathing.  Exercise. Create an exercise routine that you enjoy and that allows you to work off some energy.  Creating or listening to music.  Muscle relaxation exercises.  Medicines Some medicines may make you feel calmer and help you have fewer cravings. If your health care provider prescribes medicines, make sure you:  Avoid using alcohol and other substances that may prevent your medicines from working properly (may interact).  Talk with your pharmacist or health care provider about all medicines that you take, the possible problems (side effects) that they can cause, and which medicines are safe to take together.  Make it your goal to  take part in all treatment decisions (shared decision-making). Ask about possible side effects of medicines that your health care provider recommends, and tell him or her how you feel about having those side effects. It is best if shared decision-making with your health care provider is part of your total treatment plan.  Relationships Supportive  relationships are very important in your recovery. When you are recovering from drug addiction, it will be important to avoid being around people who use drugs. For many people, this means developing new and different relationships. Some ways to do this include:  Developing trusting relationships with the people you meet in treatment or in AA or NA. These people share your desire to stop using substances (get sober) and to stay sober.  Getting a sponsor as a primary support person if you are attending a 12-step program.  Building relationships with people you meet through activities such as hobbies, volunteering, or exercising.  How to recognize changes in your condition When recovering from an addiction, it is very common for a person to relapse and start using the substance again. Contact your sponsor, therapist, or health care provider to seek additional help if you experience the following:  Anxiety.  Excessive anger.  Isolating yourself from others.  Trouble sleeping.  Feeling depressed.  Loss of appetite.  Fantasies about using the substance.  Where to find support Talking to others  You may be advised to see a family therapist along with members of your family. Family therapy can help you and your family understand what led you to addiction. Talk with your family about this approach.  Let your family members or friends know that they can help you through treatment. Support from loved ones will be important to help you maintain positive changes. Financial resources Be sure to check with your insurance carrier to find out what treatment options are covered by your plan. You may also be able to find financial assistance through not-for-profit organizations or with local government-based resources. If you are taking medicines, you may be able to get the generic form, which may be less expensive than brand-name medicine. Some makers of prescription medicines also offer help to  patients who cannot afford the medicines that they need. Follow these instructions at home:  Take over-the-counter and prescription medicines only as told by your health care provider.  Stay in treatment until you complete the program. Take an active role in your treatment and your physical and emotional self-care. Develop a follow-up plan.  Keep all follow-up visits as told by your health care provider and counselor. This is important.  Eat a healthy diet, exercise regularly, and get enough sleep. Questions to ask your health care provider  If you are taking medicines: ? How long do I need to take medicine? ? Are there any long-term side effects of my medicine? ? Are there other options instead of taking medicine?  Would I benefit from therapy?  How often should I follow up with a health care provider? Contact a health care provider if:  You feel like you might relapse.  You have stopped taking your medicine. Get help right away if:  You have serious thoughts about hurting yourself or others. If you ever feel like you may hurt yourself or others, or have thoughts about taking your own life, get help right away. You can go to your nearest emergency department or call:  Your local emergency services (911 in the [place]  A suicide crisis helpline, such as the  National Suicide Prevention Lifeline at 7056877229. This is open 24 hours a day.  Summary  With treatment and lifestyle changes, it is possible to recover from an addiction to substances like alcohol, tobacco, illegal drugs, or prescription medicines such as painkillers.  Find effective ways to manage your stress to avoid a relapse. Some techniques to cope with stress include exercise, meditation, yoga, and deep breathing.  Let loved ones know that their support is important to help you recover.  If you have any signs that you may relapse, contact your 12-step sponsor, therapist, or health care provider to seek  additional help. This information is not intended to replace advice given to you by your health care provider. Make sure you discuss any questions you have with your health care provider. Document Released: 07/24/2016 Document Revised: 07/24/2016 Document Reviewed: 07/24/2016 Elsevier Interactive Patient Education  2018 [employer].

## 2017-10-26 NOTE — ED Notes (Signed)
Patient verbalizes understanding of discharge instructions. Opportunity for questioning and answers were provided. Armband removed by staff, pt discharged from ED.  

## 2017-10-26 NOTE — [facility] Counselor (Signed)
Joshua MeansJamison Lord, [name], seen patient face/face. Joshua MeansJamison Lord, [name], changed her disposition from being discharged in the morning to being discharged today.   Patient recommended for discharge, Per Joshua MeansJamison Lord, [name], patient does not meet inpatient hospitalization.

## 2017-10-26 NOTE — ED Notes (Signed)
IVC papers faxed to [facility].  

## 2017-10-26 NOTE — ED Notes (Signed)
Patient's belongings have been inventoried and placed in locker #2; 3 copies of IVC papers has been given to primary RN; First Exam and Recommendations still needs to be completed by EDP, Primary RN made aware-Monique,RN

## 2017-10-26 NOTE — BH Assessment (Signed)
Tele Assessment Note   Patient Name: Joshua GrimesJames B Davila MRN: [id number] Referring Physician: Antony MaduraHumes, Kelly, PA-C Location of Patient:  [facility] Location of Provider: Behavioral Health TTS Department  Joshua GrimesJames B Davila is an 37 y.o. male present to [facility] after [employer] took IVC paperwork out of patient. 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 Patient denies suicidal / homicidal ideations and denies auditory / visual hallucinations. Patient denies substance use. Patient reported to TTS writer 20 people are living in his home, he does not know who they are but they will not live. Patient UDS's positive for amphetamine and THC, report last use a few days ago.   Patient presented agitated and with delusional expressions. Patient is responding to internal stimuli. Patient denies issues with sleep, however, he report he was very tired. Patient orient x3. Patient denies taking medication or having a mental health provider. Patient recently in the ED with complaints of delusions 10/09/2017, 09/03/2017, 08/05/2017, 07/22/2017 and 04/22/2017.     Diagnosis: F25.0      Schizoaffective disorder, Bipolar type   Past Medical History:  Past Medical History:  Diagnosis Date  . Depression   . Gender identity disorder   . Liver disorder   . PTSD (post-traumatic stress disorder)   . Schizoaffective disorder (HCC)     History reviewed. No pertinent surgical history.  Family History:  Family History  Problem Relation Age of Onset  . Multiple sclerosis Mother   . Mental illness Neg Hx     Social History:  reports that he has been smoking cigarettes.  He has been smoking about 1.00 pack per day. He has never used smokeless tobacco. He reports that he drinks alcohol. He reports that he has current or past drug history. Drugs: Cocaine, Methamphetamines, and  Marijuana.  Additional Social History:  Alcohol / Drug Use Pain Medications: See MAR Prescriptions: See MAR Over the Counter: See MAR History of alcohol / drug use?: Yes Substance #1 Name of Substance 1: Amphetamines 1 - Age of First Use: unknown 1 - Amount (size/oz): excess 1 - Frequency: daily 1 - Duration: ongoing 1 - Last Use / Amount: a few days ago  Substance #2 Name of Substance 2: Marijuana 2 - Age of First Use: UTA 2 - Amount (size/oz): Pt unsure of how much he did last night 2 - Frequency: UTA 2 - Duration: Ongoing 2 - Last Use / Amount: coping days ago   CIWA: CIWA-Ar BP: (!) 92/56 Pulse Rate: 64 COWS:    Allergies:  Allergies  Allergen Reactions  . Penicillins Swelling, Rash and Other (See Comments)    Reaction:  Facial swelling Has patient had a PCN reaction causing immediate rash, facial/tongue/throat swelling, SOB or lightheadedness with hypotension: Yes Has patient had a PCN reaction causing severe rash involving mucus membranes or skin necrosis: No Has patient had a PCN reaction that required hospitalization No Has patient had a PCN reaction occurring within the last 10 years: No If all of the above answers are "NO", then may proceed with Cephalosporin use.  . Sulfa Antibiotics Rash    Home Medications:  (Not in a hospital admission)  OB/GYN Status:  No LMP for male patient.  General Assessment Data Location of Assessment: [facility] TTS Assessment: In system Is this a Tele or Face-to-Face Assessment?: Tele Assessment Is this an Initial Assessment or a Re-assessment  for this encounter?: Initial Assessment Marital status: Married Is patient pregnant?: No Pregnancy Status: No Living Arrangements: Spouse/significant other, Non-relatives/Friends Can pt return to current living arrangement?: Yes Admission Status: Involuntary Is patient capable of signing voluntary admission?: Yes Referral Source: Self/Family/Friend Insurance type: [facility]     Crisis  Care Plan Living Arrangements: Spouse/significant other, Non-relatives/Friends Name of Psychiatrist: none Name of Therapist: none  Education Status Is patient currently in school?: No Is the patient employed, unemployed or receiving disability?: Receiving disability income  Risk to self with the past 6 months Suicidal Ideation: No Has patient been a risk to self within the past 6 months prior to admission? : No Suicidal Intent: No Has patient had any suicidal intent within the past 6 months prior to admission? : No Is patient at risk for suicide?: No Suicidal Plan?: No Has patient had any suicidal plan within the past 6 months prior to admission? : No Access to Means: No What has been your use of drugs/alcohol within the last 12 months?: meth use Previous Attempts/Gestures: Yes How many times?: 1 Other Self Harm Risks: substance use Triggers for Past Attempts: Unpredictable Intentional Self Injurious Behavior: Cutting Comment - Self Injurious Behavior: pt denies  Family Suicide History: No Recent stressful life event(s): Other (Comment)(drug use) Persecutory voices/beliefs?: No Depression: No Substance abuse history and/or treatment for substance abuse?: Yes Suicide prevention information given to non-admitted patients: Not applicable  Risk to Others within the past 6 months Homicidal Ideation: No Does patient have any lifetime risk of violence toward others beyond the six months prior to admission? : No Thoughts of Harm to Others: No Current Homicidal Intent: No Current Homicidal Plan: No Access to Homicidal Means: No Identified Victim: n/a History of harm to others?: No Assessment of Violence: None Noted Violent Behavior Description: None Noted Does patient have access to weapons?: No Criminal Charges Pending?: No Does patient have a court date: No Is patient on probation?: No  Psychosis Hallucinations: Auditory, Visual Delusions: Unspecified  Mental Status  Report Appearance/Hygiene: Disheveled Eye Contact: Fair Motor Activity: Agitation, Restlessness Speech: Aggressive, Tangential Level of Consciousness: Restless Mood: Anxious, Labile Affect: Anxious Anxiety Level: Severe Thought Processes: Irrelevant, Flight of Ideas Judgement: Impaired Orientation: Person, Place, Time Obsessive Compulsive Thoughts/Behaviors: None  Cognitive Functioning Concentration: Poor Memory: Recent Impaired, Remote Impaired Is patient IDD: No Is patient DD?: No Insight: Poor Impulse Control: Poor Appetite: Good Have you had any weight changes? : No Change Sleep: No Change Total Hours of Sleep: 8  ADLScreening [facility] Services) Patient's cognitive ability adequate to safely complete daily activities?: Yes Patient able to express need for assistance with ADLs?: Yes Independently performs ADLs?: Yes (appropriate for developmental age)  Prior Inpatient Therapy Prior Inpatient Therapy: Yes Prior Therapy Dates: 2017, 2015 Prior Therapy Facilty/Provider(s): [facility] Reason for Treatment: SCHIZOAFFECTIVE, PSYCHOSIS, SUBSTANCE ABUSE   Prior Outpatient Therapy Prior Outpatient Therapy: Yes Prior Therapy Dates: 2009 Prior Therapy Facilty/Provider(s): [facility] [place] Reason for Treatment: MED MANAGEMENT  Does patient have an ACCT team?: No Does patient have Intensive In-House Services?  : No Does patient have [facility] services? : No Does patient have P4CC services?: No  ADL Screening (condition at time of admission) Patient's cognitive ability adequate to safely complete daily activities?: Yes Is the patient deaf or have difficulty hearing?: No Does the patient have difficulty seeing, even when wearing glasses/contacts?: No Does the patient have difficulty concentrating, remembering, or making decisions?: Yes Patient able to express need for assistance with ADLs?: Yes  Does the patient have difficulty dressing or bathing?: No Independently  performs ADLs?: Yes (appropriate for developmental age) Does the patient have difficulty walking or climbing stairs?: No       Abuse/Neglect Assessment (Assessment to be complete while patient is alone) Abuse/Neglect Assessment Can Be Completed: Yes Physical Abuse: Denies Verbal Abuse: Denies Sexual Abuse: Denies Exploitation of patient/patient's resources: Denies Self-Neglect: Denies     Advance Directives (For Healthcare) Does Patient Have a Medical Advance Directive?: No Would patient like information on creating a medical advance directive?: No - Patient declined          Disposition:      Militza Devery 10/26/2017 8:26 AM

## 2017-10-26 NOTE — ED Notes (Signed)
Patient denies SI/HI. Appropriate for discharge.

## 2017-10-26 NOTE — ED Notes (Signed)
Patient ambulatory to bathroom with steady gait at this time 
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
## 2017-10-26 NOTE — BHH Counselor (Signed)
Disposition:   Nanine MeansJamison Lord, NP, recommend observation overnight for safety and withdrawals concerns. Discharge person in the a.m. If no concerns are present.

## 2017-10-26 NOTE — ED Notes (Signed)
Psychiatric NP at bedside

## 2017-10-29 ENCOUNTER — Encounter (HOSPITAL_COMMUNITY): Payer: Self-pay | Admitting: Emergency Medicine

## 2017-10-29 ENCOUNTER — Other Ambulatory Visit: Payer: Self-pay

## 2017-10-29 ENCOUNTER — Emergency Department (HOSPITAL_COMMUNITY)
Admission: EM | Admit: 2017-10-29 | Discharge: 2017-10-30 | Disposition: A | Discharge status: Home / Self Care | Payer: Medicare Other | Attending: Emergency Medicine | Admitting: Emergency Medicine

## 2017-10-29 DIAGNOSIS — F17210 Nicotine dependence, cigarettes, uncomplicated: Secondary | ICD-10-CM | POA: Insufficient documentation

## 2017-10-29 DIAGNOSIS — F1520 Other stimulant dependence, uncomplicated: Secondary | ICD-10-CM | POA: Diagnosis present

## 2017-10-29 DIAGNOSIS — F259 Schizoaffective disorder, unspecified: Secondary | ICD-10-CM | POA: Diagnosis not present

## 2017-10-29 DIAGNOSIS — R441 Visual hallucinations: Secondary | ICD-10-CM | POA: Insufficient documentation

## 2017-10-29 DIAGNOSIS — F29 Unspecified psychosis not due to a substance or known physiological condition: Secondary | ICD-10-CM | POA: Diagnosis not present

## 2017-10-29 DIAGNOSIS — Z79899 Other long term (current) drug therapy: Secondary | ICD-10-CM | POA: Insufficient documentation

## 2017-10-29 DIAGNOSIS — R440 Auditory hallucinations: Secondary | ICD-10-CM | POA: Insufficient documentation

## 2017-10-29 DIAGNOSIS — I10 Essential (primary) hypertension: Secondary | ICD-10-CM | POA: Diagnosis not present

## 2017-10-29 DIAGNOSIS — F4310 Post-traumatic stress disorder, unspecified: Secondary | ICD-10-CM | POA: Diagnosis present

## 2017-10-29 LAB — COMPREHENSIVE METABOLIC PANEL
ALT: 37 U/L (ref 0–44)
AST: 43 U/L — ABNORMAL HIGH (ref 15–41)
Albumin: 4.3 g/dL (ref 3.5–5.0)
Alkaline Phosphatase: 57 U/L (ref 38–126)
Anion gap: 9 (ref 5–15)
BUN: 19 mg/dL (ref 6–20)
CALCIUM: 9.6 mg/dL (ref 8.9–10.3)
CHLORIDE: 107 mmol/L (ref 98–111)
CO2: 25 mmol/L (ref 22–32)
Creatinine, Ser: 0.79 mg/dL (ref 0.61–1.24)
GFR calc Af Amer: >60 mL/min (ref >60)
Glucose, Bld: 93 mg/dL (ref 70–99)
Potassium: 4.6 mmol/L (ref 3.5–5.1)
SODIUM: 141 mmol/L (ref 135–145)
TOTAL PROTEIN: 7.6 g/dL (ref 6.5–8.1)
Total Bilirubin: 0.8 mg/dL (ref 0.3–1.2)

## 2017-10-29 LAB — CBC
HCT: 37.7 % — ABNORMAL LOW (ref 39.0–52.0)
HEMOGLOBIN: 12.9 g/dL — AB (ref 13.0–17.0)
MCH: 28.6 pg (ref 26.0–34.0)
MCHC: 34.2 g/dL (ref 30.0–36.0)
MCV: 83.6 fL (ref 78.0–100.0)
PLATELETS: 212 10*3/uL (ref 150–400)
RBC: 4.51 MIL/uL (ref 4.22–5.81)
RDW: 14.9 % (ref 11.5–15.5)
WBC: 6 10*3/uL (ref 4.0–10.5)

## 2017-10-29 LAB — ETHANOL
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
## 2017-10-29 NOTE — BH Assessment (Signed)
BHH Assessment Progress Note  Case was staffed with Parks FNP who recommended patient be monitored and observed for safety. Patient will be seen by psychiatry in the a.m.      

## 2017-10-29 NOTE — ED Notes (Signed)
Pt sleeping at present, no distress noted, calm & cooperative at present.  A&O x 3, monitoring for safety, Q 15 min checks in effect. 
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
## 2017-10-29 NOTE — ED Notes (Signed)
Pt wanded and brought to room 36 where he asked for food and drink.  Pt was given a sandwich and sprite.  He is calm and cooperative at this time.  15 minute checks and video monitoring in place.
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
## 2017-10-30 DIAGNOSIS — F1520 Other stimulant dependence, uncomplicated: Secondary | ICD-10-CM

## 2017-10-30 LAB — RAPID URINE DRUG SCREEN, HOSP PERFORMED
Amphetamines: POSITIVE — AB
BENZODIAZEPINES: NOT DETECTED
Barbiturates: NOT DETECTED
COCAINE: NOT DETECTED
Opiates: NOT DETECTED
Tetrahydrocannabinol: POSITIVE — AB

## 2017-10-30 MED ORDER — HYDROXYZINE HCL 25 MG PO TABS: 25.0000 mg | ORAL_TABLET | Freq: Four times a day (QID) | ORAL | Status: DC | PRN

## 2017-10-30 MED ORDER — LORAZEPAM 1 MG PO TABS
1.0000 mg | ORAL_TABLET | Freq: Four times a day (QID) | ORAL | Status: DC | PRN
  Administered 2017-10-30: 1 mg via ORAL
  Filled 2017-10-30: qty 1

## 2017-10-30 NOTE — ED Notes (Signed)
Pt states he is experiencing a anxiety attack.

## 2017-10-30 NOTE — ED Notes (Addendum)
Pt discharged home. Discharged instructions read but pt was not open to hearing them. He did not want to leave. All belongings returned to pt who refused to sign for same. Denies SI/HI, is not delusional and not responding to internal stimuli. Escorted pt to the ED exit.
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
## 2017-11-24 ENCOUNTER — Encounter (HOSPITAL_COMMUNITY): Payer: Self-pay | Admitting: Emergency Medicine

## 2017-11-24 ENCOUNTER — Emergency Department (HOSPITAL_COMMUNITY)
Admission: EM | Admit: 2017-11-24 | Discharge: 2017-11-25 | Disposition: A | Discharge status: Home / Self Care | Payer: Medicare Other | Attending: Emergency Medicine | Admitting: Emergency Medicine

## 2017-11-24 DIAGNOSIS — R4689 Other symptoms and signs involving appearance and behavior: Secondary | ICD-10-CM | POA: Diagnosis present

## 2017-11-24 DIAGNOSIS — F22 Delusional disorders: Secondary | ICD-10-CM | POA: Insufficient documentation

## 2017-11-24 DIAGNOSIS — Z046 Encounter for general psychiatric examination, requested by authority: Secondary | ICD-10-CM

## 2017-11-24 DIAGNOSIS — F1910 Other psychoactive substance abuse, uncomplicated: Secondary | ICD-10-CM | POA: Diagnosis not present

## 2017-11-24 DIAGNOSIS — F250 Schizoaffective disorder, bipolar type: Secondary | ICD-10-CM

## 2017-11-24 DIAGNOSIS — F17210 Nicotine dependence, cigarettes, uncomplicated: Secondary | ICD-10-CM | POA: Insufficient documentation

## 2017-11-24 DIAGNOSIS — R259 Unspecified abnormal involuntary movements: Secondary | ICD-10-CM | POA: Diagnosis not present

## 2017-11-24 LAB — CBC WITH DIFFERENTIAL/PLATELET
BASOS PCT: 0 %
Basophils Absolute: 0 10*3/uL (ref 0.0–0.1)
Eosinophils Absolute: 0 10*3/uL (ref 0.0–0.7)
Eosinophils Relative: 1 %
HEMATOCRIT: 37.7 % — AB (ref 39.0–52.0)
HEMOGLOBIN: 13.1 g/dL (ref 13.0–17.0)
LYMPHS ABS: 2.1 10*3/uL (ref 0.7–4.0)
LYMPHS PCT: 30 %
MCH: 28.7 pg (ref 26.0–34.0)
MCHC: 34.7 g/dL (ref 30.0–36.0)
MCV: 82.5 fL (ref 78.0–100.0)
MONOS PCT: 9 %
Monocytes Absolute: 0.6 10*3/uL (ref 0.1–1.0)
NEUTROS ABS: 4.2 10*3/uL (ref 1.7–7.7)
NEUTROS PCT: 60 %
Platelets: 225 10*3/uL (ref 150–400)
RBC: 4.57 MIL/uL (ref 4.22–5.81)
RDW: 15.3 % (ref 11.5–15.5)
WBC: 7 10*3/uL (ref 4.0–10.5)

## 2017-11-24 LAB — URINALYSIS, ROUTINE W REFLEX MICROSCOPIC
BILIRUBIN URINE: NEGATIVE
GLUCOSE, UA: NEGATIVE mg/dL
Hgb urine dipstick: NEGATIVE
Ketones, ur: NEGATIVE mg/dL
Leukocytes, UA: NEGATIVE
NITRITE: NEGATIVE
PH: 6 (ref 5.0–8.0)
Protein, ur: NEGATIVE mg/dL
SPECIFIC GRAVITY, URINE: 1.011 (ref 1.005–1.030)

## 2017-11-24 LAB — COMPREHENSIVE METABOLIC PANEL
ALT: 39 U/L (ref 0–44)
AST: 48 U/L — AB (ref 15–41)
Albumin: 4.8 g/dL (ref 3.5–5.0)
Alkaline Phosphatase: 62 U/L (ref 38–126)
Anion gap: 10 (ref 5–15)
BILIRUBIN TOTAL: 1.2 mg/dL (ref 0.3–1.2)
BUN: 15 mg/dL (ref 6–20)
CO2: 24 mmol/L (ref 22–32)
Calcium: 9.7 mg/dL (ref 8.9–10.3)
Chloride: 104 mmol/L (ref 98–111)
Creatinine, Ser: 0.74 mg/dL (ref 0.61–1.24)
Glucose, Bld: 126 mg/dL — ABNORMAL HIGH (ref 70–99)
POTASSIUM: 4 mmol/L (ref 3.5–5.1)
Sodium: 138 mmol/L (ref 135–145)
TOTAL PROTEIN: 7.7 g/dL (ref 6.5–8.1)

## 2017-11-24 LAB — RAPID URINE DRUG SCREEN, HOSP PERFORMED
Amphetamines: POSITIVE — AB
Barbiturates: NOT DETECTED
Benzodiazepines: NOT DETECTED
Cocaine: NOT DETECTED
Opiates: NOT DETECTED
TETRAHYDROCANNABINOL: POSITIVE — AB

## 2017-11-24 LAB — ETHANOL

## 2017-11-24 LAB — SALICYLATE LEVEL: Salicylate Lvl: <7 mg/dL (ref 2.8–30.0)

## 2017-11-24 LAB — ACETAMINOPHEN LEVEL

## 2017-11-24 MED ORDER — ATORVASTATIN CALCIUM 20 MG PO TABS
20.0000 mg | ORAL_TABLET | Freq: Every day | ORAL | Status: DC
  Administered 2017-11-24: 20 mg via ORAL
  Filled 2017-11-24 (×2): qty 1

## 2017-11-24 MED ORDER — NICOTINE 21 MG/24HR TD PT24: 21.0000 mg | MEDICATED_PATCH | Freq: Every day | TRANSDERMAL | Status: DC

## 2017-11-24 MED ORDER — ARIPIPRAZOLE 10 MG PO TABS
10.0000 mg | ORAL_TABLET | Freq: Every day | ORAL | Status: DC
  Administered 2017-11-24 – 2017-11-25 (×2): 10 mg via ORAL
  Filled 2017-11-24 (×2): qty 1

## 2017-11-24 MED ORDER — DIPHENHYDRAMINE HCL 50 MG/ML IJ SOLN
50.0000 mg | Freq: Once | INTRAMUSCULAR | Status: AC | PRN
  Administered 2017-11-24: 50 mg via INTRAMUSCULAR
  Filled 2017-11-24: qty 1

## 2017-11-24 MED ORDER — ZIPRASIDONE MESYLATE 20 MG IM SOLR
20.0000 mg | Freq: Once | INTRAMUSCULAR | Status: AC | PRN
  Administered 2017-11-24: 20 mg via INTRAMUSCULAR
  Filled 2017-11-24: qty 20

## 2017-11-24 MED ORDER — GABAPENTIN 300 MG PO CAPS
300.0000 mg | ORAL_CAPSULE | Freq: Three times a day (TID) | ORAL | Status: DC
  Administered 2017-11-24: 300 mg via ORAL
  Filled 2017-11-24 (×2): qty 1

## 2017-11-24 MED ORDER — FAMOTIDINE 20 MG PO TABS
20.0000 mg | ORAL_TABLET | Freq: Every day | ORAL | Status: DC
  Administered 2017-11-24 – 2017-11-25 (×2): 20 mg via ORAL
  Filled 2017-11-24 (×2): qty 1

## 2017-11-24 MED ORDER — EMTRICITABINE-TENOFOVIR AF 200-25 MG PO TABS
1.0000 | ORAL_TABLET | Freq: Every day | ORAL | Status: DC
  Filled 2017-11-24 (×2): qty 1

## 2017-11-24 MED ORDER — LORAZEPAM 2 MG/ML IJ SOLN
2.0000 mg | Freq: Once | INTRAMUSCULAR | Status: AC | PRN
  Administered 2017-11-24: 2 mg via INTRAMUSCULAR
  Filled 2017-11-24: qty 1

## 2017-11-24 MED ORDER — PALIPERIDONE ER 6 MG PO TB24
6.0000 mg | ORAL_TABLET | Freq: Every day | ORAL | Status: DC
  Filled 2017-11-24 (×2): qty 1

## 2017-11-24 MED ORDER — STERILE WATER FOR INJECTION IJ SOLN
INTRAMUSCULAR | Status: AC
  Administered 2017-11-24: 10 mL
  Filled 2017-11-24: qty 10

## 2017-11-24 NOTE — ED Notes (Signed)
Pt did not want the injections, but allowed them to be given. Security steadied his arms so that he would not jerk when the needles inserted. Pt given Sprite, and dinner tray and escorted to his room to prevent his falling.

## 2017-11-24 NOTE — ED Notes (Signed)
Counselor went to assess patient, he started yelling and screaming words that were unrecognizable.

## 2017-11-24 NOTE — ED Notes (Signed)
Pt yelling and demanding to be released. Angry expression and threatening tone of voice.

## 2017-11-24 NOTE — ED Notes (Signed)
On admission to the Acute Unit pt is demanding specific drugs and is irritable. He has returned to his room where he is quiet at  This time.
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
## 2017-11-24 NOTE — ED Notes (Signed)
Bed: [facility] Expected date:  Expected time:  Means of arrival:  Comments: Hold for 28
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
## 2017-11-24 NOTE — ED Notes (Signed)
Pt sedated, sleeping at present. No distress noted, calm at present.  Monitoring for safety, Q 15 min checks in effect.

## 2017-11-25 ENCOUNTER — Other Ambulatory Visit: Payer: Self-pay

## 2017-11-25 NOTE — ED Notes (Signed)
Pt continues to be irritable and selective about which medications he will take and the amounts that he will take. He has stayed in bed all morning sleeping.

## 2017-11-25 NOTE — Consult Note (Signed)
[facility] Psych ED Discharge  11/25/2017 1:26 PM Joshua Davila  MRN:  [id number] Principal Problem: <principal problem not specified> Discharge Diagnoses:  Patient Active Problem List   Diagnosis Date Noted  . Alcohol use disorder, severe, in early remission, in controlled environment (HCC) [F10.21] 04/22/2017  . Polysubstance abuse (HCC) [F19.10]   . Schizoaffective disorder, bipolar type (HCC) [F25.0] 10/02/2015  . Involuntary commitment [Z04.6] 10/02/2015  . Cannabis use disorder, moderate, dependence (HCC) [F12.20] 09/16/2015  . Opioid use disorder, moderate, dependence (HCC) [F11.20] 09/16/2015  . Substance or medication-induced bipolar and related disorder with onset during intoxication (HCC) [I20.355, F19.94] 09/16/2015  . 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 Benzodiazepine dependence (HCC) [F13.20] 10/04/2013  . PTSD (post-traumatic stress disorder) [F43.10] 10/03/2013  . 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  Pt denies suicidal/homicidal ideation, denies auditory/visual hallucinations and does not appear to be responding to internal stimuli. Pt was angry and agitated during assessment and stated he just wanted to sleep. Pt is well known to this emergency room and frequently presents agitated after using drugs. Pt's UDS positive for amphetamines and THC, BAL negative. Pt arrived on the acute unit on 11-24-17 in the afternoon and was requesting benzodiazepines. Pt was given Vistaril for his anxiety. Pt was verbally abusive to the staff in the acute unit and had to receive emergency medications due to his loud and aggressive behavior and threatening demeanor toward the ED staff. Pt then settled down and slept well overnight without any  further incidents. Pt was administered his home medications of Abilify 10 mg but refused his Invega 6 mg, Neurontin and Descovy for his HIV.  Pt is stable and psychiatrically clear for discharge.   Total Time spent with patient: 45 minutes  Past Psychiatric History: As above  Past Medical History:  Past Medical History:  Diagnosis Date  . Depression   . Gender identity disorder   . Liver disorder   . PTSD (post-traumatic stress disorder)   . Schizoaffective disorder (HCC)    History reviewed. No pertinent surgical history. Family History:  Family History  Problem Relation Age of Onset  . Multiple sclerosis Mother   . Mental illness Neg Hx    Family Psychiatric  History: Unknown Social History:  Social History   Substance and Sexual Activity  Alcohol Use Yes   Comment: occasional    Social History   Substance and Sexual Activity  Drug Use Yes  . Types: Cocaine, Methamphetamines, Marijuana   Comment: last useof methamphetamines and marijuana yesterday   Social History   Socioeconomic History  . Marital status: Single    Spouse name: Not on file  . Number of children: Not on file  . Years of education: Not on file  . Highest education level: Not on file  Occupational History  . Not on file  Social Needs  . Financial resource strain: Not on file  . Food insecurity:    Worry: Not on file    Inability: Not on file  . Transportation needs:    Medical: Not on file    Non-medical: Not on file  Tobacco Use  . Smoking status: Current Every Day Smoker    Packs/day: 1.00    Types: Cigarettes  . Smokeless tobacco: Never Used  Substance and Sexual Activity  .  Alcohol use: Yes    Comment: occasional  . Drug use: Yes    Types: Cocaine, Methamphetamines, Marijuana    Comment: last useof methamphetamines and marijuana yesterday  . Sexual activity: Not Currently  Lifestyle  . Physical activity:    Days per week: Not on file    Minutes per session: Not on file  .  Stress: Not on file  Relationships  . Social connections:    Talks on phone: Not on file    Gets together: Not on file    Attends religious service: Not on file    Active member of club or organization: Not on file    Attends meetings of clubs or organizations: Not on file    Relationship status: Not on file  Other Topics Concern  . Not on file  Social History Narrative  . Not on file    Has this patient used any form of tobacco in the last 30 days? (Cigarettes, Smokeless Tobacco, Cigars, and/or Pipes) Prescription not provided because: Pt declined  Current Medications: Current Facility-Administered Medications  Medication Dose Route Frequency Provider Last Rate Last Dose  . ARIPiprazole (ABILIFY) tablet 10 mg  10 mg Oral Daily Laveda Abbe, NP   10 mg at 11/25/17 1058  . atorvastatin (LIPITOR) tablet 20 mg  20 mg Oral q1800 Laveda Abbe, NP   20 mg at 11/24/17 1709  . emtricitabine-tenofovir AF (DESCOVY) 200-25 MG per tablet 1 tablet  1 tablet Oral Daily Laveda Abbe, NP      . famotidine (PEPCID) tablet 20 mg  20 mg Oral Daily Laveda Abbe, NP   20 mg at 11/25/17 1058  . gabapentin (NEURONTIN) capsule 300 mg  300 mg Oral TID Laveda Abbe, NP   Stopped at 11/24/17 2112  . nicotine (NICODERM CQ - dosed in mg/24 hours) patch 21 mg  21 mg Transdermal Daily Laveda Abbe, NP      . paliperidone (INVEGA) 24 hr tablet 6 mg  6 mg Oral Daily Laveda Abbe, NP       Current Outpatient Medications  Medication Sig Dispense Refill  . ARIPiprazole (ABILIFY) 10 MG tablet Take 1 tablet (10 mg total) by mouth daily. (Patient not taking: Reported on 10/08/2017) 30 tablet 0  . atorvastatin (LIPITOR) 20 MG tablet Take 1 tablet (20 mg total) by mouth daily. For high Cholesterol (Patient not taking: Reported on 10/08/2017) 1 tablet 0  . emtricitabine-tenofovir (TRUVADA) 200-300 MG tablet Take 1 tablet by mouth daily. For pre-exposure to HIV  (Patient not taking: Reported on 10/08/2017)    . gabapentin (NEURONTIN) 300 MG capsule Take 1 capsule (300 mg total) by mouth 3 (three) times daily. (Patient not taking: Reported on 10/08/2017) 90 capsule 0  . paliperidone (INVEGA) 6 MG 24 hr tablet Take 1 tablet (6 mg total) by mouth daily. (Patient not taking: Reported on 10/08/2017) 30 tablet 0    Musculoskeletal: Strength & Muscle Tone: within normal limits Gait & Station: normal Patient leans: N/A  Psychiatric Specialty Exam: Physical Exam  Constitutional: He is oriented to person, place, and time. He appears well-developed and well-nourished.  HENT:  Head: Normocephalic.  Respiratory: Effort normal.  Musculoskeletal: Normal range of motion.  Neurological: He is alert and oriented to person, place, and time.  Psychiatric: His speech is normal and behavior is normal. Thought content normal. His mood appears anxious. Cognition and memory are normal. He expresses impulsivity. He exhibits a depressed mood.    Review  of Systems  Psychiatric/Behavioral: Positive for depression and substance abuse. Negative for hallucinations, memory loss and suicidal ideas. The patient is nervous/anxious. The patient does not have insomnia.   All other systems reviewed and are negative.   Blood pressure (!) 125/91, pulse 86, temperature 98 F (36.7 C), temperature source Oral, resp. rate 17, SpO2 96 %.There is no height or weight on file to calculate BMI.  General Appearance: Casual  Eye Contact:  Poor  Speech:  Clear and Coherent  Volume:  Decreased  Mood:  Angry, Anxious and Irritable  Affect:  Congruent  Thought Process:  Coherent and Descriptions of Associations: Intact  Orientation:  Full (Time, Place, and Person)  Thought Content:  Logical  Suicidal Thoughts:  No  Homicidal Thoughts:  No  Memory:  Immediate;   Good Recent;   Fair Remote;   Fair  Judgement:  Poor  Insight:  Lacking  Psychomotor Activity:  Normal  Concentration:   Concentration: Fair and Attention Span: Fair  Recall:  [employer] of Knowledge:  Fair  Language:  Good  Akathisia:  No  Handed:  Right  AIMS (if indicated):     Assets:  Architect Housing Social Support  ADL's:  Intact  Cognition:  WNL  Sleep:   Fair     Demographic Factors:  Male, Low socioeconomic status and Unemployed  Loss Factors: Financial problems/change in socioeconomic status  Historical Factors: Impulsivity  Risk Reduction Factors:   Sense of responsibility to family and Living with another person, especially a relative  Continued Clinical Symptoms:  Depression:   Aggression Impulsivity Alcohol/Substance Abuse/Dependencies More than one psychiatric diagnosis  Cognitive Features That Contribute To Risk:  Closed-mindedness    Suicide Risk:  Minimal: No identifiable suicidal ideation.  Patients presenting with no risk factors but with morbid ruminations; may be classified as minimal risk based on the severity of the depressive symptoms    Plan Of Care/Follow-up recommendations:  Activity:  as tolerated Diet:  Heart healthy  Disposition: Take all medications as prescribed. Keep all follow-up appointments as scheduled.  Follow up with [facility] for further community assistance and care management.  Do not consume alcohol or use illegal drugs while on prescription medications. Report any adverse effects from your medications to your primary care provider promptly.  In the event of recurrent symptoms or worsening symptoms, call 911, a crisis hotline, or go to the nearest emergency department for evaluation.   Laveda Abbe, NP 11/25/2017, 1:26 PM

## 2017-11-25 NOTE — Patient Outreach (Signed)
[facility] met with the patient to provide help with substance use treatment and substance use recovery support. Patient states he does not want help with substance use treatment at this time and just wanted to sleep. [facility] still provided [facility] contact information. [facility] strongly encouraged the patient to follow up with [facility] if he feels his substance use becomes unamanegable and would like help with recovery resources.  

## 2017-11-25 NOTE — ED Notes (Signed)
Pt discharged to return to guardian. Guardian aware that pt is being discharged. Pt discharged home. Discharged instructions read to pt. Pt not interested in d/c instructions. . All belongings returned to pt who was not cooperative. Pt  Denies SI/HI, is not delusional and not responding to internal stimuli. Escorted pt to the ED exit. Refused VS this admission.

## 2017-11-25 NOTE — Consult Note (Signed)
   [facility] Inpatient Consult   11/25/2017  Joshua Davila 20-May-1980 [id number]    [facility] Care Management referral received for patient who is in psych holding in [facility] ED. Chart reviewed and noted patient's Primary Provider is the [place] which is not [facility]. Therefore, not eligible for [facility] Care Management at this time.   Will notify [facility] Care Management office.  Raiford Noble, MSN-Ed, RN,BSN [facility] Liaison [phone]

## 2017-11-25 NOTE — Discharge Instructions (Signed)
To help you maintain a sober lifestyle, a substance abuse treatment program may be beneficial to you.  Contact one of the following programs at your earliest opportunity to ask about enrolling:       [facility]      [address], [place] [address]      [phone]      New patients are seen at the walk-in clinic every Tuesday from 9:00 am - 12:00 pm       [facility]      [address] [place], [place] [address]      [phone]

## 2017-11-25 NOTE — BH Assessment (Signed)
[facility] Assessment Progress Note  Per Nelly Rout, MD, this pt does not require psychiatric hospitalization at this time.  Pt presents under IVC initiated by pt's spouse/legal guardian, which Dr Lucianne Muss has rescinded.  Pt is to be discharged from [facility] with recommendation to follow [facility] with [facility] or with [facility].  This has been included in pt's discharge instructions.  Pt's nurse, Diane, has been notified.  Doylene Canning, MA Triage Specialist [phone]

## 2017-11-26 ENCOUNTER — Other Ambulatory Visit: Payer: Self-pay | Admitting: *Deleted

## 2017-11-26 NOTE — Patient Outreach (Signed)
[facility]) Care Management  11/26/2017  Joshua Davila [birth year] [id number]   Telephone Screen  Referral Date:  11/26/2017 Referral Source:  [facility] ED Census Reason for Referral:  6 or more ED visits in the past 6 months Insurance:  Medicare   Outreach Attempt:  Outreach attempt #1 to patient for telephone screening. Call went immediately to voicemail. RN Health Coach left HIPAA compliant voicemail message along with contact information.  Plan:  RN Health Coach will attempt another telephone outreach in the next 3-4 business days.   [name] RN [facility] Care Management  RN Health Coach [phone] [name]

## 2017-11-30 ENCOUNTER — Other Ambulatory Visit: Payer: Self-pay

## 2017-11-30 NOTE — Patient Outreach (Signed)
[facility]) Care Management  11/30/2017  AMITH NOBLETT [birth year] [id number]    Telephone Screen  Referral Date:  11/26/2017 Referral Source:  [facility] ED Census Reason for Referral:  6 or more ED visits in the past 6 months Insurance:  Medicare  Outreach attempt # 2 to the patient.  No answer. HIPAA compliant voicemail left with contact information.  Plan: RN Health Coach will send letter. RN Health Coach will attempt another telephone outreach in the next 3-4 business days.   Juanell Fairly RN, BSN, [facility] RN Health Coach Disease Management [facility] Dial:  [phone]  Fax: [phone]

## 2017-12-01 ENCOUNTER — Emergency Department (HOSPITAL_COMMUNITY)
Admission: EM | Admit: 2017-12-01 | Discharge: 2017-12-02 | Disposition: A | Discharge status: Home / Self Care | Payer: Medicare Other | Attending: Emergency Medicine | Admitting: Emergency Medicine

## 2017-12-01 ENCOUNTER — Other Ambulatory Visit: Payer: Self-pay

## 2017-12-01 ENCOUNTER — Encounter (HOSPITAL_COMMUNITY): Payer: Self-pay

## 2017-12-01 DIAGNOSIS — R440 Auditory hallucinations: Secondary | ICD-10-CM | POA: Diagnosis not present

## 2017-12-01 DIAGNOSIS — F17210 Nicotine dependence, cigarettes, uncomplicated: Secondary | ICD-10-CM | POA: Insufficient documentation

## 2017-12-01 DIAGNOSIS — F1290 Cannabis use, unspecified, uncomplicated: Secondary | ICD-10-CM | POA: Insufficient documentation

## 2017-12-01 DIAGNOSIS — R441 Visual hallucinations: Secondary | ICD-10-CM | POA: Insufficient documentation

## 2017-12-01 DIAGNOSIS — F918 Other conduct disorders: Secondary | ICD-10-CM | POA: Diagnosis not present

## 2017-12-01 DIAGNOSIS — F250 Schizoaffective disorder, bipolar type: Secondary | ICD-10-CM | POA: Insufficient documentation

## 2017-12-01 DIAGNOSIS — Z046 Encounter for general psychiatric examination, requested by authority: Secondary | ICD-10-CM

## 2017-12-01 DIAGNOSIS — R259 Unspecified abnormal involuntary movements: Secondary | ICD-10-CM | POA: Diagnosis not present

## 2017-12-01 DIAGNOSIS — F4310 Post-traumatic stress disorder, unspecified: Secondary | ICD-10-CM | POA: Insufficient documentation

## 2017-12-01 DIAGNOSIS — R4689 Other symptoms and signs involving appearance and behavior: Secondary | ICD-10-CM

## 2017-12-01 LAB — ETHANOL

## 2017-12-01 LAB — CBC
HEMATOCRIT: 36 % — AB (ref 39.0–52.0)
Hemoglobin: 12.3 g/dL — ABNORMAL LOW (ref 13.0–17.0)
MCH: 29.1 pg (ref 26.0–34.0)
MCHC: 34.2 g/dL (ref 30.0–36.0)
MCV: 85.1 fL (ref 78.0–100.0)
Platelets: 211 10*3/uL (ref 150–400)
RBC: 4.23 MIL/uL (ref 4.22–5.81)
RDW: 15.4 % (ref 11.5–15.5)
WBC: 5.9 10*3/uL (ref 4.0–10.5)

## 2017-12-01 LAB — RAPID URINE DRUG SCREEN, HOSP PERFORMED
AMPHETAMINES: POSITIVE — AB
Barbiturates: POSITIVE — AB
Benzodiazepines: NOT DETECTED
Cocaine: NOT DETECTED
OPIATES: NOT DETECTED
TETRAHYDROCANNABINOL: POSITIVE — AB

## 2017-12-01 LAB — COMPREHENSIVE METABOLIC PANEL
ALT: 30 U/L (ref 0–44)
AST: 27 U/L (ref 15–41)
Albumin: 4.3 g/dL (ref 3.5–5.0)
Alkaline Phosphatase: 54 U/L (ref 38–126)
Anion gap: 9 (ref 5–15)
BUN: 13 mg/dL (ref 6–20)
CHLORIDE: 105 mmol/L (ref 98–111)
CO2: 25 mmol/L (ref 22–32)
CREATININE: 0.69 mg/dL (ref 0.61–1.24)
Calcium: 9.3 mg/dL (ref 8.9–10.3)
Glucose, Bld: 101 mg/dL — ABNORMAL HIGH (ref 70–99)
Potassium: 4 mmol/L (ref 3.5–5.1)
Sodium: 139 mmol/L (ref 135–145)
TOTAL PROTEIN: 7.3 g/dL (ref 6.5–8.1)
Total Bilirubin: 0.7 mg/dL (ref 0.3–1.2)

## 2017-12-01 LAB — ACETAMINOPHEN LEVEL: Acetaminophen (Tylenol), Serum: <10 ug/mL — ABNORMAL LOW (ref 10–30)

## 2017-12-01 LAB — SALICYLATE LEVEL

## 2017-12-01 MED ORDER — HYDROXYZINE HCL 25 MG PO TABS: 25.0000 mg | ORAL_TABLET | Freq: Three times a day (TID) | ORAL | Status: DC | PRN

## 2017-12-01 MED ORDER — DIPHENHYDRAMINE HCL 25 MG PO CAPS
50.0000 mg | ORAL_CAPSULE | Freq: Once | ORAL | Status: AC
  Administered 2017-12-01: 50 mg via ORAL
  Filled 2017-12-01: qty 2

## 2017-12-01 MED ORDER — TRAZODONE HCL 50 MG PO TABS: 50.0000 mg | ORAL_TABLET | Freq: Every day | ORAL | Status: DC

## 2017-12-01 MED ORDER — ACETAMINOPHEN 500 MG PO TABS
1000.0000 mg | ORAL_TABLET | Freq: Once | ORAL | Status: AC
  Administered 2017-12-01: 1000 mg via ORAL
  Filled 2017-12-01: qty 2

## 2017-12-01 NOTE — ED Notes (Signed)
PER CHARGE TIM SMITH RN- HUSBAND CALLED AND REPORTS HE HAS HCPOA AND POA. PAPER TO PROVE THIS. HE WOULD LIKE TO BE NOTIFIED OF PT'S CONDITION.

## 2017-12-01 NOTE — ED Notes (Signed)
Pt stated "I hear voices.  They're upstairs."

## 2017-12-01 NOTE — ED Notes (Signed)
Pt refused vital signs needed to be transferred to [facility].

## 2017-12-01 NOTE — ED Notes (Signed)
Pt yelling stating "I'm not going to over the psych ward.  Somebody is going to look at this place on my butt.  Y'all are all crazy.  I'm not going to the psych ward for a health problem."

## 2017-12-01 NOTE — Progress Notes (Signed)
TTS consulted with Nira Conn, NP who recommends inpt treatment. TTS to seek placement. Pt's husband requests the pt be treated at the [facility] hospital. EDP Dr. Effie Shy, MD and pt's nurse Reagan, Vivien Rota, RN have been advised of the disposition.   Princess Bruins, MSW, LCSW Therapeutic Triage Specialist  [phone]
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
## 2017-12-01 NOTE — ED Notes (Signed)
Patient requesting something for sleep Joshua Heck, PA contacted and new orders entered By PA.

## 2017-12-01 NOTE — ED Notes (Signed)
Pt likes to be referred to as ma'am or miss.

## 2017-12-01 NOTE — ED Notes (Signed)
TTS assessment in progress. 

## 2017-12-01 NOTE — ED Notes (Signed)
Pt refused vitals needed to be transferred to [facility].

## 2017-12-01 NOTE — ED Notes (Signed)
Patient allowed to use phone. Patient was clam while on phone. Warm blanket given to patient. Encouragement and support provided and safety maintain. Q 15 min safety checks remain in place and video monitoring.
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
## 2017-12-01 NOTE — ED Notes (Signed)
Patient currently denies SI/HI/AVH. Patient given a sandwich and soda. Patient was calm with this Clinical research associate. Plan o f care discussed. Encouragement and support provided and safety maintain. Q 15 min safety checks  in place and video monitoring.

## 2017-12-02 ENCOUNTER — Other Ambulatory Visit: Payer: Self-pay

## 2017-12-02 DIAGNOSIS — F250 Schizoaffective disorder, bipolar type: Secondary | ICD-10-CM | POA: Diagnosis not present

## 2017-12-02 DIAGNOSIS — R4689 Other symptoms and signs involving appearance and behavior: Secondary | ICD-10-CM | POA: Insufficient documentation

## 2017-12-02 MED ORDER — ACETAMINOPHEN 325 MG PO TABS
650.0000 mg | ORAL_TABLET | Freq: Four times a day (QID) | ORAL | Status: DC | PRN
  Administered 2017-12-02: 650 mg via ORAL
  Filled 2017-12-02: qty 2

## 2017-12-02 NOTE — ED Notes (Signed)
Requesting pain meds

## 2017-12-02 NOTE — ED Notes (Signed)
Educated pt on discharge instruction and follow up appointment.

## 2017-12-02 NOTE — Discharge Instructions (Signed)
To help you maintain a sober lifestyle, a substance abuse treatment program may be beneficial to you.  Contact one of the following providers at your earliest opportunity to ask about enrolling:       [facility]      [address]      [place], [place] [address]      [phone]      New patients are seen at the walk-in clinic every Tuesday from 9:00 am - 12:00 pm       [facility]      [address]      [place], [place] [address]      [phone]       [facility]      [address]      [place], [place] [address]      [phone]  For supportive services for veterans in the [place] area, contact the [facility]:       [facility]      [address]      [place], [place] [address]      [phone]      Hours of operation: 8:00 am - 7:00 pm, Monday - Friday  For crisis services for veterans, call the [facility], available 24 hours a day, 7 days a week:       [facility]      [phone]

## 2017-12-02 NOTE — Consult Note (Addendum)
[facility] ED Discharge  12/02/2017 12:58 PM Joshua Davila  MRN:  [id number] Principal Problem: Schizoaffective disorder, bipolar type [facility]) Discharge Diagnoses:  Patient Active Problem List   Diagnosis Date Noted  . Aggressive behavior [R46.89]   . Alcohol use disorder, severe, in early remission, in controlled environment (HCC) [F10.21] 04/22/2017  . Polysubstance abuse (HCC) [F19.10]   . Schizoaffective disorder, bipolar type (HCC) [F25.0] 10/02/2015  . Involuntary commitment [Z04.6] 10/02/2015  . Cannabis use disorder, moderate, dependence (HCC) [F12.20] 09/16/2015  . Opioid use disorder, moderate, dependence (HCC) [F11.20] 09/16/2015  . Substance or medication-induced bipolar and related disorder with onset during intoxication (HCC) [J19.147, F19.94] 09/16/2015  . 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 Benzodiazepine dependence (HCC) [F13.20] 10/04/2013  . PTSD (post-traumatic stress disorder) [F43.10] 10/03/2013  . 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 Pt denies suicidal/homicidal ideation, denies auditory/visual hallucinations and does not appear to be responding to internal stimuli. Pt stated there is no reason for him to take any mental health medications and all he wanted was something for pain because "his butt bone hurts."  Pt was moaning and continuously asking for something for pain. Pt is well known to this emergency room is frequently presenting for drug use and combative behavior. Pt's UDS positive for amphetamines, barbiturates, and THC, BAL negative. Pt lives with his husband who placed him under IVC for erratic behavior at home. Pt was determined to be psychiatrically clear and stable for discharge.   Total Time spent  with patient: 45 minutes  Past Psychiatric History: As above  Past Medical History:  Past Medical History:  Diagnosis Date  . Depression   . Gender identity disorder   . Liver disorder   . PTSD (post-traumatic stress disorder)   . Schizoaffective disorder (HCC)    History reviewed. No pertinent surgical history. Family History:  Family History  Problem Relation Age of Onset  . Multiple sclerosis Mother   . Mental illness Neg Hx    Family Psychiatric  History: None  Social History:  Social History   Substance and Sexual Activity  Alcohol Use Yes   Comment: occasional    Social History   Substance and Sexual Activity  Drug Use Yes  . Types: Cocaine, Methamphetamines, Marijuana   Comment: last useof methamphetamines and marijuana yesterday   Social History   Socioeconomic History  . Marital status: Single    Spouse name: Not on file  . Number of children: Not on file  . Years of education: Not on file  . Highest education level: Not on file  Occupational History  . Not on file  Social Needs  . Financial resource strain: Not on file  . Food insecurity:    Worry: Not on file    Inability: Not on file  . Transportation needs:    Medical: Not on file    Non-medical: Not on file  Tobacco Use  . Smoking status: Current Every Day Smoker    Packs/day: 1.00    Types: Cigarettes  . Smokeless tobacco: Never Used  Substance and Sexual Activity  . Alcohol use: Yes    Comment: occasional  . Drug use: Yes    Types: Cocaine, Methamphetamines, Marijuana    Comment: last useof methamphetamines and marijuana yesterday  . Sexual activity: Not  Currently  Lifestyle  . Physical activity:    Days per week: Not on file    Minutes per session: Not on file  . Stress: Not on file  Relationships  . Social connections:    Talks on phone: Not on file    Gets together: Not on file    Attends religious service: Not on file    Active member of club or organization: Not on file     Attends meetings of clubs or organizations: Not on file    Relationship status: Not on file  Other Topics Concern  . Not on file  Social History Narrative  . Not on file    Has this patient used any form of tobacco in the last 30 days? (Cigarettes, Smokeless Tobacco, Cigars, and/or Pipes) Prescription not provided because: pt declined  Current Medications: Current Facility-Administered Medications  Medication Dose Route Frequency Provider Last Rate Last Dose  . acetaminophen (TYLENOL) tablet 650 mg  650 mg Oral Q6H PRN Cherly Beachorman, Druanne Bosques J, DO   650 mg at 12/02/17 1048  . hydrOXYzine (ATARAX/VISTARIL) tablet 25 mg  25 mg Oral TID PRN Jackelyn PolingBerry, Jason A, NP      . traZODone (DESYREL) tablet 50 mg  50 mg Oral QHS Jackelyn PolingBerry, Jason A, NP       Current Outpatient Medications  Medication Sig Dispense Refill  . ARIPiprazole (ABILIFY) 10 MG tablet Take 1 tablet (10 mg total) by mouth daily. (Patient not taking: Reported on 12/01/2017) 30 tablet 0  . atorvastatin (LIPITOR) 20 MG tablet Take 1 tablet (20 mg total) by mouth daily. For high Cholesterol (Patient not taking: Reported on 10/08/2017) 1 tablet 0  . emtricitabine-tenofovir (TRUVADA) 200-300 MG tablet Take 1 tablet by mouth daily. For pre-exposure to HIV (Patient not taking: Reported on 10/08/2017)    . gabapentin (NEURONTIN) 300 MG capsule Take 1 capsule (300 mg total) by mouth 3 (three) times daily. (Patient not taking: Reported on 10/08/2017) 90 capsule 0  . paliperidone (INVEGA) 6 MG 24 hr tablet Take 1 tablet (6 mg total) by mouth daily. (Patient not taking: Reported on 10/08/2017) 30 tablet 0    Musculoskeletal: Strength & Muscle Tone: within normal limits Gait & Station: normal Patient leans: N/A  Psychiatric Specialty Exam: Physical Exam  Nursing note and vitals reviewed. Constitutional: He is oriented to person, place, and time. He appears well-developed and well-nourished.  HENT:  Head: Normocephalic and atraumatic.  Neck: Normal  range of motion.  Respiratory: Effort normal.  Musculoskeletal: Normal range of motion.  Neurological: He is alert and oriented to person, place, and time.  Psychiatric: His speech is normal and behavior is normal. Judgment and thought content normal. His affect is labile. Cognition and memory are normal.    Review of Systems  Psychiatric/Behavioral: Positive for substance abuse. Negative for depression, hallucinations, memory loss and suicidal ideas. The patient is not nervous/anxious and does not have insomnia.   All other systems reviewed and are negative.   Blood pressure 119/82, pulse 67, temperature 98.2 F (36.8 C), temperature source Oral, resp. rate 16, height 6\' 1"  (1.854 m), weight 81.6 kg, SpO2 100 %.Body mass index is 23.75 kg/m.  General Appearance: Casual  Eye Contact:  Good  Speech:  Clear and Coherent and Normal Rate  Volume:  Normal  Mood:  Angry and Irritable  Affect:  Congruent and Labile  Thought Process:  Coherent  Orientation:  Full (Time, Place, and Person)  Thought Content:  Logical  Suicidal Thoughts:  No  Homicidal Thoughts:  No  Memory:  Immediate;   Fair Recent;   Fair Remote;   Fair  Judgement:  Fair  Insight:  Fair  Psychomotor Activity:  Normal  Concentration:  Concentration: Fair and Attention Span: Fair  Recall:  [employer] of Knowledge:  Fair  Language:  Good  Akathisia:  No  Handed:  Right  AIMS (if indicated):   N/A  Assets:  Architect Housing Social Support  ADL's:  Intact  Cognition:  WNL  Sleep:   N/A     Demographic Factors:  Male, Adolescent or young adult and Gay, lesbian, or bisexual orientation  Loss Factors: Financial problems/change in socioeconomic status  Historical Factors: Impulsivity  Risk Reduction Factors:   Living with another person, especially a relative  Continued Clinical Symptoms:  Bipolar Disorder:   Depressive phase Alcohol/Substance  Abuse/Dependencies Previous Psychiatric Diagnoses and Treatments  Cognitive Features That Contribute To Risk:  Closed-mindedness    Suicide Risk:  Minimal: No identifiable suicidal ideation.  Patients presenting with no risk factors but with morbid ruminations; may be classified as minimal risk based on the severity of the depressive symptoms    Plan Of Care/Follow-up recommendations:  Activity:  as tolerated Diet:  Heart healthy  Disposition: Take all medications as prescribed. Keep all follow-up appointments as scheduled.  Do not consume alcohol or use illegal drugs while on prescription medications. Report any adverse effects from your medications to your primary care provider promptly.  In the event of recurrent symptoms or worsening symptoms, call 911, a crisis hotline, or go to the nearest emergency department for evaluation.   Laveda Abbe, NP 12/02/2017, 12:58 PM   Patient seen face-to-face for psychiatric evaluation, chart reviewed and case discussed with the physician extender and developed treatment plan. Reviewed the information documented and agree with the treatment plan.  Juanetta Beets, DO 12/02/17 10:54 PM

## 2017-12-02 NOTE — BH Assessment (Addendum)
[facility]  Per Juanetta BeetsJacqueline Norman, DO, this pt does not require psychiatric hospitalization at this time.  Pt presents under IVC initiated by pt's husband, which Dr Sharma CovertNorman has rescinded.  [facility] with recommendation to follow up with area substance abuse treatment providers.  He is also to be provided with information regarding area [facility].  These resources have been included in pt's discharge instructions.  Pt's nurse, Angelique BlonderDenise, has been notified.  Doylene Canninghomas Shannyn Jankowiak, MA Triage Specialist [phone]   Addendum:  At 12:25 this writer called pt's spouse/legal guardian, Laqueta CarinaDwayne Hill [phone]), to notify him of pt's disposition.  Mr Loleta ChanceHill disagrees with disposition and expresses his dissatisfaction, but in the end understands that Dr Sharma CovertNorman finds no reason to believe that pt presents a life threatening danger to himself or others at this time.  [facility] Loleta ChanceHill [facility] within 30 minutes.  Angelique BlonderDenise has been notified.  Doylene Canninghomas Ryley Teater, [place] Behavioral Health Coordinator [phone]

## 2017-12-02 NOTE — Patient Outreach (Signed)
[facility] Care Management  12/02/2017  Charlynn GrimesJames B Moors Jan 22, 1981 [id number]    Rn Health Coach notified patient in the hospital on 12/01/2017 for involuntary commitment.  Plan[facility] close the case at the is time due to admission.  [name] Senters RN, BSN, [facility] RN Health Coach Disease Management [facility] Direct Dial:  [phone]  Fax: [phone]

## 2018-03-31 ENCOUNTER — Encounter (HOSPITAL_COMMUNITY): Payer: Self-pay

## 2018-03-31 ENCOUNTER — Emergency Department (HOSPITAL_COMMUNITY)
Admission: EM | Admit: 2018-03-31 | Discharge: 2018-04-01 | Disposition: A | Discharge status: Home / Self Care | Payer: Medicare Other | Attending: Emergency Medicine | Admitting: Emergency Medicine

## 2018-03-31 DIAGNOSIS — Z79899 Other long term (current) drug therapy: Secondary | ICD-10-CM | POA: Diagnosis not present

## 2018-03-31 DIAGNOSIS — L03012 Cellulitis of left finger: Secondary | ICD-10-CM | POA: Diagnosis not present

## 2018-03-31 DIAGNOSIS — F17210 Nicotine dependence, cigarettes, uncomplicated: Secondary | ICD-10-CM | POA: Insufficient documentation

## 2018-03-31 DIAGNOSIS — M79645 Pain in left finger(s): Secondary | ICD-10-CM | POA: Diagnosis present

## 2018-03-31 LAB — COMPREHENSIVE METABOLIC PANEL
ALT: 26 U/L (ref 0–44)
AST: 24 U/L (ref 15–41)
Albumin: 3.8 g/dL (ref 3.5–5.0)
Alkaline Phosphatase: 52 U/L (ref 38–126)
Anion gap: 9 (ref 5–15)
BUN: 6 mg/dL (ref 6–20)
CHLORIDE: 105 mmol/L (ref 98–111)
CO2: 24 mmol/L (ref 22–32)
CREATININE: 0.82 mg/dL (ref 0.61–1.24)
Calcium: 9.2 mg/dL (ref 8.9–10.3)
GFR calc non Af Amer: >60 mL/min (ref >60)
Glucose, Bld: 152 mg/dL — ABNORMAL HIGH (ref 70–99)
POTASSIUM: 4.1 mmol/L (ref 3.5–5.1)
SODIUM: 138 mmol/L (ref 135–145)
Total Bilirubin: 0.5 mg/dL (ref 0.3–1.2)
Total Protein: 6.9 g/dL (ref 6.5–8.1)

## 2018-03-31 LAB — CBC WITH DIFFERENTIAL/PLATELET
Abs Immature Granulocytes: 0.09 10*3/uL — ABNORMAL HIGH (ref 0.00–0.07)
BASOS PCT: 0 %
Basophils Absolute: 0 10*3/uL (ref 0.0–0.1)
EOS PCT: 1 %
Eosinophils Absolute: 0.1 10*3/uL (ref 0.0–0.5)
HCT: 41.5 % (ref 39.0–52.0)
Hemoglobin: 13.8 g/dL (ref 13.0–17.0)
Immature Granulocytes: 1 %
LYMPHS PCT: 18 %
Lymphs Abs: 1.7 10*3/uL (ref 0.7–4.0)
MCH: 29.1 pg (ref 26.0–34.0)
MCHC: 33.3 g/dL (ref 30.0–36.0)
MCV: 87.6 fL (ref 80.0–100.0)
MONO ABS: 0.5 10*3/uL (ref 0.1–1.0)
Monocytes Relative: 6 %
NRBC: 0 % (ref 0.0–0.2)
Neutro Abs: 7.1 10*3/uL (ref 1.7–7.7)
Neutrophils Relative %: 74 %
PLATELETS: 195 10*3/uL (ref 150–400)
RBC: 4.74 MIL/uL (ref 4.22–5.81)
RDW: 14.2 % (ref 11.5–15.5)
WBC: 9.5 10*3/uL (ref 4.0–10.5)

## 2018-03-31 MED ORDER — KETOROLAC TROMETHAMINE 15 MG/ML IJ SOLN
15.0000 mg | Freq: Once | INTRAMUSCULAR | Status: AC
  Administered 2018-04-01: 15 mg via INTRAVENOUS
  Filled 2018-03-31: qty 1

## 2018-03-31 MED ORDER — SODIUM CHLORIDE 0.9 % IV SOLN: 100.0000 mg | Freq: Once | INTRAVENOUS | Status: DC

## 2018-03-31 MED ORDER — BUPIVACAINE HCL (PF) 0.5 % IJ SOLN
10.0000 mL | Freq: Once | INTRAMUSCULAR | Status: AC
  Administered 2018-03-31: 10 mL
  Filled 2018-03-31: qty 10

## 2018-03-31 NOTE — ED Provider Notes (Signed)
[facility] EMERGENCY DEPARTMENT Provider Note   CSN: [id number] Arrival date & time: 03/31/18  1846     History   Chief Complaint Chief Complaint  Patient presents with  . Finger Infection    HPI Joshua Davila is a 38 y.o. male.  HPI   38 yo M here with finger infection. Pt has h/o polysubstance abuse and schizoaffective d/o. He reports that over the day today, he has had progressively worsening left middle finger pain and swelling. He states he believes he was bit by an insect on his middle finger, and since then has had worsening pain, swelling, redness, and warmth. Pain is localized to the dorsum of distal phalanx and denies any pain with flexion/extension, or pain along palmar surface. No fever or chills. He has a possible h/o MRSA per report, and a roommate was recently diagnosed w/ cellulitis as well. No h/o immune suppression. Pain is worse with any kind of movement or palpation. He has not tried anything for it.  Past Medical History:  Diagnosis Date  . Depression   . Gender identity disorder   . Liver disorder   . PTSD (post-traumatic stress disorder)   . Schizoaffective disorder [facility])     Patient Active Problem List   Diagnosis Date Noted  . Aggressive behavior   . Alcohol use disorder, severe, in early remission, in controlled environment (HCC) 04/22/2017  . Polysubstance abuse (HCC)   . Schizoaffective disorder, bipolar type (HCC) 10/02/2015  . Involuntary commitment 10/02/2015  . Cannabis use disorder, moderate, dependence (HCC) 09/16/2015  . Opioid use disorder, moderate, dependence (HCC) 09/16/2015  . Substance or medication-induced bipolar and related disorder with onset during intoxication (HCC) 09/16/2015  . 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 Benzodiazepine dependence (HCC) 10/04/2013  . PTSD (post-traumatic  stress disorder) 10/03/2013  . 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      Home Medications    Prior to Admission medications   Medication Sig Start Date End Date Taking? Authorizing Provider  ARIPiprazole (ABILIFY) 10 MG tablet Take 1 tablet (10 mg total) by mouth daily. Patient not taking: Reported on 12/01/2017 08/06/17   Charm Rings, NP  atorvastatin (LIPITOR) 20 MG tablet Take 1 tablet (20 mg total) by mouth daily. For high Cholesterol Patient not taking: Reported on 10/08/2017 11/11/15   Armandina Stammer I, NP  doxycycline (VIBRAMYCIN) 100 MG capsule Take 1 capsule (100 mg total) by mouth 2 (two) times daily for 10 days. 04/01/18 04/11/18  Shaune Pollack, MD  emtricitabine-tenofovir (TRUVADA) 200-300 MG tablet Take 1 tablet by mouth daily. 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 Patient not taking: Reported on 10/08/2017 08/06/17   Charm Rings, NP  paliperidone (INVEGA) 6 MG 24 hr tablet Take 1 tablet (6 mg total) by mouth daily. Patient not taking: Reported on 10/08/2017 08/06/17   Charm Rings, NP    Family History Family History  Problem Relation Age of Onset  . Multiple sclerosis Mother   . Mental illness Neg Hx     Social History Social History   Tobacco Use  . Smoking status: Current Every Day Smoker    Packs/day: 1.00  Types: Cigarettes  . Smokeless tobacco: Never Used  Substance Use Topics  . Alcohol use: Yes    Comment: occasional  . Drug use: Yes    Types: Cocaine, Methamphetamines, Marijuana    Comment: last useof methamphetamines and marijuana yesterday     Allergies   Penicillins and Sulfa antibiotics   Review of Systems Review of Systems  Constitutional: Negative for chills and fever.  Respiratory: Negative for shortness of  breath.   Cardiovascular: Negative for chest pain.  Musculoskeletal: Negative for neck pain.  Skin: Positive for rash and wound.  Allergic/Immunologic: Negative for immunocompromised state.  Neurological: Negative for weakness and numbness.  Hematological: Does not bruise/bleed easily.     Physical Exam Updated Vital Signs BP 112/84 (BP Location: Right Arm)   Pulse 94   Temp 97.6 F (36.4 C) (Oral)   Resp 16   SpO2 100%   Physical Exam Vitals signs and nursing note reviewed.  Constitutional:      General: He is not in acute distress.    Appearance: He is well-developed.  HENT:     Head: Normocephalic and atraumatic.  Eyes:     Conjunctiva/sclera: Conjunctivae normal.  Neck:     Musculoskeletal: Neck supple.  Cardiovascular:     Rate and Rhythm: Normal rate and regular rhythm.     Heart sounds: Normal heart sounds.  Pulmonary:     Effort: Pulmonary effort is normal. No respiratory distress.     Breath sounds: No wheezing.  Abdominal:     General: There is no distension.  Skin:    General: Skin is warm.     Capillary Refill: Capillary refill takes less than 2 seconds.     Findings: No rash.  Neurological:     Mental Status: He is alert and oriented to person, place, and time.     Motor: No abnormal muscle tone.      UPPER EXTREMITY EXAM: LEFT  INSPECTION & PALPATION: Paronychia left middle finger, with moderate edema and surrounding erythema. There erythema does not extend to the tuft or flexor surface of the digit. No pain with passive extension or flexion of the digit. No streaking erythema.   SENSORY: Sensation is intact to light touch in:  Superficial radial nerve distribution (dorsal first web space) Median nerve distribution (tip of index finger)   Ulnar nerve distribution (tip of small finger)     MOTOR:  + Motor posterior interosseous nerve (thumb IP extension) + Anterior interosseous nerve (thumb IP flexion, index finger DIP flexion) + Radial nerve  (wrist extension) + Median nerve (palpable firing thenar mass) + Ulnar nerve (palpable firing of first dorsal interosseous muscle)  VASCULAR: 2+ radial pulse Brisk capillary refill < 2 sec, fingers warm and well-perfused  COMPARTMENTS: Soft, warm, well-perfused No pain with passive extension No paresthesias        ED Treatments / Results  Labs (all labs ordered are listed, but only abnormal results are displayed) Labs Reviewed  COMPREHENSIVE METABOLIC PANEL - Abnormal; Notable for the following components:      Result Value   Glucose, Bld 152 (*)    All other components within normal limits  CBC WITH DIFFERENTIAL/PLATELET - Abnormal; Notable for the following components:   Abs Immature Granulocytes 0.09 (*)    All other components within normal limits  CULTURE, BLOOD (ROUTINE X 2)  AEROBIC CULTURE (SUPERFICIAL SPECIMEN)    EKG None  Radiology No results found.  Procedures .Marland Kitchen.Incision and Drainage Date/Time: 04/01/2018 12:34 AM Performed  by: Shaune Pollack, MD Authorized by: Shaune Pollack, MD   Consent:    Consent obtained:  Verbal   Consent given by:  Patient   Risks discussed:  Bleeding, damage to other organs, incomplete drainage, infection and pain   Alternatives discussed:  Alternative treatment and delayed treatment Location:    Type:  Abscess   Location: Left middle finger. Pre-procedure details:    Skin preparation:  Betadine Anesthesia (see MAR for exact dosages):    Anesthesia method:  Nerve block   Block needle gauge:  27 G   Block anesthetic:  Bupivacaine 0.5% w/o epi   Block technique:  Digital   Block injection procedure:  Anatomic landmarks identified, anatomic landmarks palpated, negative aspiration for blood, introduced needle and incremental injection   Block outcome:  Anesthesia achieved Procedure type:    Complexity:  Simple Procedure details:    Incision types:  Single straight   Incision depth:  Dermal   Scalpel blade:  11    Wound management:  Probed and deloculated and irrigated with saline   Drainage:  Purulent   Drainage amount:  Copious   Wound treatment:  Wound left open   Packing materials:  None Post-procedure details:    Patient tolerance of procedure:  Tolerated well, no immediate complications   (including critical care time)  Medications Ordered in ED Medications  vancomycin (VANCOCIN) 1,500 mg in sodium chloride 0.9 % 500 mL IVPB (1,500 mg Intravenous New Bag/Given 04/01/18 0141)  bupivacaine (MARCAINE) 0.5 % injection 10 mL (10 mLs Infiltration Given 03/31/18 2340)  ketorolac (TORADOL) 15 MG/ML injection 15 mg (15 mg Intravenous Given 04/01/18 0030)  doxycycline (VIBRA-TABS) tablet 100 mg (100 mg Oral Given 04/01/18 0040)     Initial Impression / Assessment and Plan / ED Course  I have reviewed the triage vital signs and the nursing notes.  Pertinent labs & imaging results that were available during my care of the patient were reviewed by me and considered in my medical decision making (see chart for details).     38 yo M here with paronychia of left middle finger. Pt anesthetized with digital block and I&D performed with expression of significant purulence. Abscess cavity irrigated thoroughly and sterile dressing placed. Pt has no streaking erythema to suggest ascending cellulitis. No flexor/palmar surface swelling, TTP, or pain with passive extension or flexion to suggest flexor teno. Pt certainly at risk for MRSA and offered observation, but pt would like to attempt outpt management at this time. He was given a dose of Vanc here and will d/c on doxy, warm soaks, with good return precautions. BCx sent.  Final Clinical Impressions(s) / ED Diagnoses   Final diagnoses:  Paronychia of left middle finger    ED Discharge Orders         Ordered    doxycycline (VIBRAMYCIN) 100 MG capsule  2 times daily     04/01/18 0040           Shaune Pollack, MD 04/01/18 [phone]

## 2018-03-31 NOTE — ED Notes (Signed)
Pt does not want iv would rather have IM injection

## 2018-03-31 NOTE — ED Triage Notes (Signed)
Pt here for possible left middle finger infection for 2 days. No injury reported.

## 2018-04-01 MED ORDER — DOXYCYCLINE HYCLATE 100 MG PO CAPS: 100.0000 mg | ORAL_CAPSULE | Freq: Two times a day (BID) | ORAL | 0 refills | Status: AC

## 2018-04-01 MED ORDER — DOXYCYCLINE HYCLATE 100 MG PO TABS
100.0000 mg | ORAL_TABLET | Freq: Once | ORAL | Status: AC
  Administered 2018-04-01: 100 mg via ORAL
  Filled 2018-04-01: qty 1

## 2018-04-01 MED ORDER — VANCOMYCIN HCL 10 G IV SOLR
1500.0000 mg | Freq: Once | INTRAVENOUS | Status: AC
  Administered 2018-04-01: 1500 mg via INTRAVENOUS
  Filled 2018-04-01: qty 1500

## 2018-04-01 NOTE — ED Notes (Signed)
Given coke and pt tolerating well

## 2018-04-01 NOTE — Discharge Instructions (Addendum)
Soak the finger in warm water or Epsom salts at least 3-4 times daily to facilitate drainage  Take the antibiotics as prescribed  If redness, pain, or swelling worsens, return to the ER immediately

## 2018-04-01 NOTE — ED Notes (Signed)
Waiting for antibiotics from pharmacy.

## 2018-04-03 LAB — AEROBIC CULTURE W GRAM STAIN (SUPERFICIAL SPECIMEN)

## 2018-04-03 LAB — AEROBIC CULTURE  (SUPERFICIAL SPECIMEN)

## 2018-04-06 LAB — CULTURE, BLOOD (ROUTINE X 2): Culture: NO GROWTH

## 2018-05-29 ENCOUNTER — Encounter (HOSPITAL_COMMUNITY): Payer: Self-pay

## 2018-05-29 ENCOUNTER — Emergency Department (HOSPITAL_COMMUNITY)
Admission: EM | Admit: 2018-05-29 | Discharge: 2018-06-02 | Payer: Medicare Other | Attending: Emergency Medicine | Admitting: Emergency Medicine

## 2018-05-29 DIAGNOSIS — F23 Brief psychotic disorder: Secondary | ICD-10-CM | POA: Diagnosis not present

## 2018-05-29 DIAGNOSIS — F250 Schizoaffective disorder, bipolar type: Secondary | ICD-10-CM | POA: Insufficient documentation

## 2018-05-29 DIAGNOSIS — F1220 Cannabis dependence, uncomplicated: Secondary | ICD-10-CM | POA: Diagnosis not present

## 2018-05-29 DIAGNOSIS — Z79899 Other long term (current) drug therapy: Secondary | ICD-10-CM | POA: Insufficient documentation

## 2018-05-29 DIAGNOSIS — F17210 Nicotine dependence, cigarettes, uncomplicated: Secondary | ICD-10-CM | POA: Diagnosis not present

## 2018-05-29 DIAGNOSIS — Z046 Encounter for general psychiatric examination, requested by authority: Secondary | ICD-10-CM | POA: Diagnosis present

## 2018-05-29 LAB — COMPREHENSIVE METABOLIC PANEL
ALBUMIN: 3.9 g/dL (ref 3.5–5.0)
ALT: 36 U/L (ref 0–44)
AST: 33 U/L (ref 15–41)
Alkaline Phosphatase: 62 U/L (ref 38–126)
Anion gap: 8 (ref 5–15)
BILIRUBIN TOTAL: 0.5 mg/dL (ref 0.3–1.2)
BUN: 15 mg/dL (ref 6–20)
CHLORIDE: 109 mmol/L (ref 98–111)
CO2: 22 mmol/L (ref 22–32)
CREATININE: 0.86 mg/dL (ref 0.61–1.24)
Calcium: 9.2 mg/dL (ref 8.9–10.3)
GFR calc Af Amer: >60 mL/min (ref >60)
GLUCOSE: 99 mg/dL (ref 70–99)
Potassium: 3.8 mmol/L (ref 3.5–5.1)
Sodium: 139 mmol/L (ref 135–145)
TOTAL PROTEIN: 6.6 g/dL (ref 6.5–8.1)

## 2018-05-29 LAB — CBC
HEMATOCRIT: 36.8 % — AB (ref 39.0–52.0)
HEMOGLOBIN: 12.2 g/dL — AB (ref 13.0–17.0)
MCH: 28.8 pg (ref 26.0–34.0)
MCHC: 33.2 g/dL (ref 30.0–36.0)
MCV: 86.8 fL (ref 80.0–100.0)
NRBC: 0 % (ref 0.0–0.2)
Platelets: 199 10*3/uL (ref 150–400)
RBC: 4.24 MIL/uL (ref 4.22–5.81)
RDW: 14.5 % (ref 11.5–15.5)
WBC: 7.7 10*3/uL (ref 4.0–10.5)

## 2018-05-29 LAB — ETHANOL

## 2018-05-29 LAB — ACETAMINOPHEN LEVEL: Acetaminophen (Tylenol), Serum: <10 ug/mL — ABNORMAL LOW (ref 10–30)

## 2018-05-29 LAB — SALICYLATE LEVEL: Salicylate Lvl: <7 mg/dL (ref 2.8–30.0)

## 2018-05-29 MED ORDER — LORAZEPAM 1 MG PO TABS
1.0000 mg | ORAL_TABLET | Freq: Once | ORAL | Status: AC
  Administered 2018-05-29: 1 mg via ORAL
  Filled 2018-05-29: qty 1

## 2018-05-29 MED ORDER — LORAZEPAM 2 MG/ML IJ SOLN: 1.0000 mg | Freq: Once | INTRAMUSCULAR | Status: DC

## 2018-05-29 NOTE — ED Triage Notes (Addendum)
Pt comes via [employer] with IVC paperwork, hx of schizophrenia, has not been taking his medications, having hallucinations thinking that people are tazing on him and trying to kill him in his sleep. Denies SI/HI, pt refuses to answer any more questions for triage, pt responding to internal stimuli

## 2018-05-29 NOTE — ED Notes (Signed)
Pt very anxious and starting to become agitated. Speech is delusional, demanding and insulting.  Pt eating a sandwich.  Asked pt if he would be willing to take take an injection, he states "I'll just take a pill"  Spoke to PA who agreed to change order. Pt appears to be experiencing auditory hallucinations.  RN gave pt pill which he took.  Will continue to monitor.

## 2018-05-29 NOTE — ED Notes (Addendum)
Pt talking to "people" in room that are not there, having full conversations, getting agitated

## 2018-05-29 NOTE — ED Notes (Signed)
Pt changed in purple scrubs and belongings placed at nursing station labeled.

## 2018-05-29 NOTE — ED Provider Notes (Signed)
[facility] EMERGENCY DEPARTMENT Provider Note   CSN: [id number] Arrival date & time: 05/29/18  2221    History   Chief Complaint Chief Complaint  Patient presents with  . IVC    HPI Joshua Davila is a 38 y.o. male.     The history is provided by the patient and medical records.     38 year old male with history of depression, gender identity disorder, PTSD, schizoaffective disorder, history of polysubstance abuse, presenting to the ED under IVC petition by his husband.  Per IVC paperwork "He identifies as male but has completely neglected his personal hygiene, is not sleeping, and is not taking his medications.  He is also hallucinating, thinks people are tazing him while he takes a shower but it is just water".  When talking with patient, his only complaint is that he has trouble sleeping.  States when he lays down and will drift off to sleep he knows that people are "suffocating him".  He cannot tell me who they are or what they look like.  States this happens whenever he lays down and therefore has had a lot of trouble sleeping.  States he feels very tired.  Per [employer], husband has attempted to get him help at the [place] to get him back on his medications, however patient refuses to go and refuses to comply with any of the physicians recommendations.  Past Medical History:  Diagnosis Date  . Depression   . Gender identity disorder   . Liver disorder   . PTSD (post-traumatic stress disorder)   . Schizoaffective disorder [facility])     Patient Active Problem List   Diagnosis Date Noted  . Aggressive behavior   . Alcohol use disorder, severe, in early remission, in controlled environment (HCC) 04/22/2017  . Polysubstance abuse (HCC)   . Schizoaffective disorder, bipolar type (HCC) 10/02/2015  . Involuntary commitment 10/02/2015  . Cannabis use disorder, moderate, dependence (HCC) 09/16/2015  . Opioid use disorder, moderate, dependence (HCC) 09/16/2015  .  Substance or medication-induced bipolar and related disorder with onset during intoxication (HCC) 09/16/2015  . 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 Benzodiazepine dependence (HCC) 10/04/2013  . PTSD (post-traumatic stress disorder) 10/03/2013  . 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 No pertinent surgical history.      Home Medications    Prior to Admission medications   Medication Sig Start Date End Date Taking? Authorizing Provider  ARIPiprazole (ABILIFY) 10 MG tablet Take 1 tablet (10 mg total) by mouth daily. Patient not taking: Reported on 12/01/2017 08/06/17   Charm Rings, NP  atorvastatin (LIPITOR) 20 MG tablet Take 1 tablet (20 mg total) by mouth daily. For high Cholesterol Patient not taking: Reported on 10/08/2017 11/11/15   Armandina Stammer I, NP  emtricitabine-tenofovir (TRUVADA) 200-300 MG tablet Take 1 tablet by mouth daily. 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 Patient not taking: Reported on 10/08/2017 08/06/17   Charm Rings, NP  paliperidone (INVEGA) 6 MG 24 hr tablet Take 1 tablet (6 mg total) by mouth daily. Patient not taking: Reported on 10/08/2017 08/06/17   Charm Rings, NP    Family History Family History  Problem Relation Age of Onset  .  Multiple sclerosis Mother   . Mental illness Neg Hx     Social History Social History   Tobacco Use  . Smoking status: Current Every Day Smoker    Packs/day: 1.00    Types: Cigarettes  . Smokeless tobacco: Never Used  Substance Use Topics  . Alcohol use: Yes    Comment: occasional  . Drug use: Yes    Types: Cocaine, Methamphetamines, Marijuana    Comment: last useof methamphetamines and  marijuana yesterday     Allergies   Penicillins and Sulfa antibiotics   Review of Systems Review of Systems  Psychiatric/Behavioral:       IVC  All other systems reviewed and are negative.    Physical Exam Updated Vital Signs BP 118/84 (BP Location: Right Arm)   Pulse 69   Temp 98.1 F (36.7 C) (Oral)   Resp 18   SpO2 100%   Physical Exam Vitals signs and nursing note reviewed.  Constitutional:      Appearance: He is well-developed.     Comments: Disheveled appearing  HENT:     Head: Normocephalic and atraumatic.  Eyes:     Conjunctiva/sclera: Conjunctivae normal.     Pupils: Pupils are equal, round, and reactive to light.  Neck:     Musculoskeletal: Normal range of motion.  Cardiovascular:     Rate and Rhythm: Normal rate and regular rhythm.     Heart sounds: Normal heart sounds.  Pulmonary:     Effort: Pulmonary effort is normal.     Breath sounds: Normal breath sounds.  Abdominal:     General: Bowel sounds are normal.     Palpations: Abdomen is soft.  Musculoskeletal: Normal range of motion.  Skin:    General: Skin is warm and dry.  Neurological:     Mental Status: He is alert and oriented to person, place, and time.  Psychiatric:        Mood and Affect: Affect is flat.        Behavior: Behavior is withdrawn.     Comments: Withdrawn, seems to be responding to internal stimuli; reports hallucinations of people "suffocating him with pillows" No SI/HI      ED Treatments / Results  Labs (all labs ordered are listed, but only abnormal results are displayed) Labs Reviewed  ACETAMINOPHEN LEVEL - Abnormal; Notable for the following components:      Result Value   Acetaminophen (Tylenol), Serum <10 (*)    All other components within normal limits  CBC - Abnormal; Notable for the following components:   Hemoglobin 12.2 (*)    HCT 36.8 (*)    All other components within normal limits  RAPID URINE DRUG SCREEN, HOSP PERFORMED - Abnormal; Notable for the  following components:   Tetrahydrocannabinol POSITIVE (*)    All other components within normal limits  COMPREHENSIVE METABOLIC PANEL  ETHANOL  SALICYLATE LEVEL    EKG None  Radiology No results found.  Procedures Procedures (including critical care time)  Medications Ordered in ED Medications - No data to display   Initial Impression / Assessment and Plan / ED Course  I have reviewed the triage vital signs and the nursing notes.  Pertinent labs & imaging results that were available during my care of the patient were reviewed by me and considered in my medical decision making (see chart for details).  38 year old male here under IVC petition by his spouse.  Patient has history of schizophrenia, PTSD, polysubstance abuse, has been refusing to take his  medications, care for himself, and has been hallucinating at home.  He reports to me that people are chasing him when he takes a shower and trying to suffocate him when he lays down to sleep, therefore has not had any sleep in several days.  He appears disheveled, is talking to himself, seems to be responding to internal stimuli.  Labs are obtained and are overall reassuring.  His UDS is positive for THC.  Medically cleared.  TTS has evaluated and obtain collateral information from patient's significant other, recommendation is for inpatient treatment.  I agree with this.  Patient holding here for bed placement.  Final Clinical Impressions(s) / ED Diagnoses   Final diagnoses:  Schizophrenia, acute [facility])    ED Discharge Orders    None       Garlon Hatchet, PA-C 05/30/18 0255    Melene Plan, DO 05/31/18 1103

## 2018-05-30 LAB — RAPID URINE DRUG SCREEN, HOSP PERFORMED
Amphetamines: NOT DETECTED
Barbiturates: NOT DETECTED
Benzodiazepines: NOT DETECTED
COCAINE: NOT DETECTED
Opiates: NOT DETECTED
Tetrahydrocannabinol: POSITIVE — AB

## 2018-05-30 MED ORDER — ATORVASTATIN CALCIUM 10 MG PO TABS
20.0000 mg | ORAL_TABLET | Freq: Every day | ORAL | Status: DC
  Administered 2018-05-30 – 2018-06-02 (×4): 20 mg via ORAL
  Filled 2018-05-30 (×4): qty 2

## 2018-05-30 MED ORDER — PALIPERIDONE ER 6 MG PO TB24
6.0000 mg | ORAL_TABLET | Freq: Every day | ORAL | Status: DC
  Administered 2018-05-30 – 2018-06-02 (×4): 6 mg via ORAL
  Filled 2018-05-30 (×5): qty 1

## 2018-05-30 MED ORDER — GABAPENTIN 300 MG PO CAPS
300.0000 mg | ORAL_CAPSULE | Freq: Three times a day (TID) | ORAL | Status: DC
  Administered 2018-05-30 – 2018-06-02 (×9): 300 mg via ORAL
  Filled 2018-05-30 (×9): qty 1

## 2018-05-30 MED ORDER — ARIPIPRAZOLE 10 MG PO TABS
10.0000 mg | ORAL_TABLET | Freq: Every day | ORAL | Status: DC
  Administered 2018-05-30 – 2018-06-02 (×4): 10 mg via ORAL
  Filled 2018-05-30 (×4): qty 1

## 2018-05-30 NOTE — ED Notes (Signed)
IVC paperwork given to provider.  Pt appears to be resting comfortably.

## 2018-05-30 NOTE — ED Notes (Signed)
Breakfast Tray Ordered. 

## 2018-05-30 NOTE — Progress Notes (Signed)
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  Pt. Under review at the following:  [facility]   [facility]   [facility]   [facility]   [facility]   [facility]   [facility]   [facility]   [facility]   [facility]   [facility] T. [name], MSW, LCSWA Disposition Clinical Social Work [phone] (cell) [phone] (office)

## 2018-05-30 NOTE — ED Notes (Signed)
Pt woken to complete TTS.  RN has faxed IVC to [facility] as requested and copies made to be placed in chart.  Pt was wonded by security.

## 2018-05-30 NOTE — BHH Counselor (Signed)
Pt's RN sending Pt's IVC paperwork after review clinician will complete TTS assessment.    Redmond Pulling, MS, [facility], [facility] Triage Specialist [phone]

## 2018-05-30 NOTE — ED Notes (Signed)
Lunch tray ordered 

## 2018-05-30 NOTE — BHH Counselor (Signed)
Pt reports he did not sleep well because when he lays down he stops breathing. Pt reports eating well. Pt stated this clinician was a robot and was walking and standing during the assessment. Pt denies SI/HI/AH/VH.

## 2018-05-30 NOTE — ED Notes (Signed)
Per TTS pt not able to stay awake to participate in TTS.

## 2018-05-30 NOTE — ED Notes (Signed)
Dinner tray ordered for pt

## 2018-05-30 NOTE — ED Notes (Addendum)
Patient belongings inventoried and in locker #8

## 2018-05-30 NOTE — BHH Counselor (Signed)
This clinician has called twice in attempts to re-assess pt. Nurse reports she is with another pt with medical procedure and needs to locate the machine and will contact TTS clinician when re-assessment can be completed.

## 2018-05-30 NOTE — BH Assessment (Addendum)
Tele Assessment Note   Patient Name: Joshua Davila MRN: [id number] Referring Physician: Lanae Crumbly, PA-C. Location of Patient: [facility], [phone]. Location of Provider: Behavioral Health TTS Department  Joshua Davila is an 38 y.o. male, who presents involuntary and unaccompanied to [facility]. Pt was a poor historian during the assessment, pt did not re-engage. Clinician asked the pt, "what brought you to the hospital?" Pt reported, "stop breathing." Clinician asked the pt when did he stop breathing however the pt did not respond. Pt denies, SI, HI, AVH, legal involvement.   Clinician contacted pt's husband/IVC petitioner Mellody Dance [place], [phone])  to obtain additional collateral information. Pt's husband reported, the pt has been off his medications for over a year and experiencing delusions for 3-4 weeks. Pt's husband reported, the pt would becomes verbally aggressive if he is not agreed with. Pt's husband reported, the pt talks a lot about violence, he yells and screams. Pt's husband reported, the pt sees people punching, and kicking him 24/7. Pt's husband reported, the pt paces all through the night. Pt's husband reported, the pt thinks people are urinating on him, people are using a tazing and poking him. Pt's husband reported, the pt told the officers who responded tonight that he was pregnant. Pt's husband reported, the pt stresses over the "small things."   Pt was IVC'd by his spouse. Per IVC paperwork: "Respondent is a 38 year old male diagnoses with Schizophrenia, Bi-polar, PTSD, and Gender Identity Disorder. Defendant is under doctor's care but does not take his medication as prescribed. As of recently the defendant has been hallucinating. While showering, the defendant believes that someone is urinating on him as well as tazering him when it's only water. For the last few weeks and today the respondent has shown violent behaviors. Husband advised that he punches the air stating that he is  punching people who are urinating on him in the shower. He has also punched and kicked walls. Respondent is not sleeping and wakes up 10-12 times a night to go outside."   Clinician was unable to assess the following: stressors, family supports, history of abuse, sleep, appetite, symptoms of depression, vegetative symptoms, access to weapons, self-injurious behaviors, contract for safety, history of violence. Pt's UDS is positive for marijuana. Pt has previous inpatient admissions to [facility] for polysubstance abuse, aggressive behaviors, AVH.   Pt presents sleeping in scrubs with slurred speech. Pt's mood was irritable. Pt's affect was irritable, flat. Pt's thought process was circumstantial. Pt's judgements was impaired. Pt was oriented x1. PT's concentration, insight and impulse control are poor.  Diagnosis: Schizoaffective Disorder, Bipolar type.                      Cannabis, use Disorder, moderate.   Past Medical History:  Past Medical History:  Diagnosis Date  . Depression   . Gender identity disorder   . Liver disorder   . PTSD (post-traumatic stress disorder)   . Schizoaffective disorder (HCC)     History reviewed. No pertinent surgical history.  Family History:  Family History  Problem Relation Age of Onset  . Multiple sclerosis Mother   . Mental illness Neg Hx     Social History:  reports that he has been smoking cigarettes. He has been smoking about 1.00 pack per day. He has never used smokeless tobacco. He reports current alcohol use. He reports current drug use. Drugs: Cocaine, Methamphetamines, and Marijuana.  Additional Social History:  Alcohol / Drug Use  Pain Medications: See MAR Prescriptions: See MAR Over the Counter: See MAR History of alcohol / drug use?: Yes Substance #1 Name of Substance 1: Marijuana.  1 - Age of First Use: UTA 1 - Amount (size/oz): UDS is positive for marijuana.  1 - Frequency: UTA 1 - Duration: UTA 1 - Last Use / Amount:  UTA  CIWA: CIWA-Ar BP: 118/84 Pulse Rate: 69 COWS:    Allergies:  Allergies  Allergen Reactions  . Penicillins Swelling, Rash and Other (See Comments)    Reaction:  Facial swelling Has patient had a PCN reaction causing immediate rash, facial/tongue/throat swelling, SOB or lightheadedness with hypotension: Yes Has patient had a PCN reaction causing severe rash involving mucus membranes or skin necrosis: No Has patient had a PCN reaction that required hospitalization No Has patient had a PCN reaction occurring within the last 10 years: No If all of the above answers are "NO", then may proceed with Cephalosporin use.  . Sulfa Antibiotics Rash    Home Medications: (Not in a hospital admission)   OB/GYN Status:  No LMP for male patient.  General Assessment Data Location of Assessment: [facility] ED TTS Assessment: In system Is this a Tele or Face-to-Face Assessment?: Face-to-Face Is this an Initial Assessment or a Re-assessment for this encounter?: Initial Assessment Patient Accompanied by:: N/A Language Other than English: No Living Arrangements: Other (Comment)(with spouse.) What gender do you identify as?: Male Marital status: Married Living Arrangements: Spouse/significant other Can pt return to current living arrangement?: Yes Admission Status: Involuntary Petitioner: Other(Spouse.) Is patient capable of signing voluntary admission?: No Referral Source: Psychiatrist Insurance type: Medicare.     Crisis Care Plan Living Arrangements: Spouse/significant other Legal Guardian: Other:(Self. ) Name of Psychiatrist: NA Name of Therapist: Dr[facility] Dawn[facility] Part at [facility].(Pt refuses to go.)  Education Status Is patient currently in school?: No Is the patient employed, unemployed or receiving disability?: (UTA)  Risk to self with the past 6 months Suicidal Ideation: No(Pt denies. ) Has patient been a risk to self within the past 6 months prior to admission? : No Suicidal Intent: No Has  patient had any suicidal intent within the past 6 months prior to admission? : No Is patient at risk for suicide?: No Suicidal Plan?: No(Pt denies. ) Has patient had any suicidal plan within the past 6 months prior to admission? : No Access to Means: (UTA) What has been your use of drugs/alcohol within the last 12 months?: Marijuana.  Previous Attempts/Gestures: No(Pt denies. ) How many times?: 0 Other Self Harm Risks: UTA Triggers for Past Attempts: None known Intentional Self Injurious Behavior: None(Pt denies. ) Family Suicide History: Unable to assess Recent stressful life event(s): (UTA) Persecutory voices/beliefs?: Yes Depression: (UTA) Substance abuse history and/or treatment for substance abuse?: (UTA) Suicide prevention information given to non-admitted patients: Not applicable  Risk to Others within the past 6 months Homicidal Ideation: No(Pt denies. ) Does patient have any lifetime risk of violence toward others beyond the six months prior to admission? : (UTA) Thoughts of Harm to Others: No(Pt denies. ) Current Homicidal Intent: No Current Homicidal Plan: No(Pt denies. ) Access to Homicidal Means: (UTA) Identified Victim: NA History of harm to others?: (Pt denies. ) Assessment of Violence: (UTA) Violent Behavior Description: UTA Does patient have access to weapons?: (UTA) Criminal Charges Pending?: No(Pt denies. ) Does patient have a court date: No(Pt denies. ) Is patient on probation?: No(Pt denies. )  Psychosis Hallucinations: Auditory, Tactile, Visual Delusions: Somatic  Mental Status Report Appearance/Hygiene:  In scrubs Eye Contact: Poor Motor Activity: Unremarkable Speech: Slurred Level of Consciousness: Sleeping Mood: Irritable Affect: Irritable, Flat Anxiety Level: None Thought Processes: Circumstantial Judgement: Impaired Orientation: Person Obsessive Compulsive Thoughts/Behaviors: None  Cognitive Functioning Concentration: Poor Memory: Recent  Impaired Is patient IDD: No Insight: Poor Impulse Control: Poor Appetite: (UTA) Have you had any weight changes? : (UTA) Sleep: Unable to Assess Vegetative Symptoms: Unable to Assess  ADLScreening [facility] Assessment Services) Patient's cognitive ability adequate to safely complete daily activities?: Yes Patient able to express need for assistance with ADLs?: Yes Independently performs ADLs?: Yes (appropriate for developmental age)  Prior Inpatient Therapy Prior Inpatient Therapy: Yes Prior Therapy Dates: 05/2915-06/20/2015, 09/12/2015-09/15/2015, 10/30/2015-11/01/2015. Prior Therapy Facilty/Provider(s): [facility] Reason for Treatment: Polysubstance abuse, aggressive behaviors, AVH.   Prior Outpatient Therapy Prior Outpatient Therapy: Yes Prior Therapy Dates: Over a year ago.  Prior Therapy Facilty/Provider(s): Dr. Dawna Part at the [facility] in [place], [place]. Reason for Treatment: Medication management.  Does patient have an ACCT team?: No Does patient have Intensive In-House Services?  : No Does patient have [facility] services? : No Does patient have P4CC services?: No  ADL Screening (condition at time of admission) Patient's cognitive ability adequate to safely complete daily activities?: Yes Is the patient deaf or have difficulty hearing?: No Does the patient have difficulty seeing, even when wearing glasses/contacts?: (UTA) Does the patient have difficulty concentrating, remembering, or making decisions?: Yes Patient able to express need for assistance with ADLs?: Yes Does the patient have difficulty dressing or bathing?: No Independently performs ADLs?: Yes (appropriate for developmental age) Does the patient have difficulty walking or climbing stairs?: (UTA) Weakness of Legs: (UTA) Weakness of Arms/Hands: (UTA)  Home Assistive Devices/Equipment Home Assistive Devices/Equipment: (UTA)    Abuse/Neglect Assessment (Assessment to be complete while patient is alone) Abuse/Neglect  Assessment Can Be Completed: Unable to assess, patient is non-responsive or altered mental status     Advance Directives (For Healthcare) Does Patient Have a Medical Advance Directive?: (UTA)          Disposition: Nira Conn, FNP, recommend inpatient treatment. Disposition discussed with Misty Stanley, PA and Victorino Dike, RN. TTS to seek placement.     This service was provided via telemedicine using a 2-way, interactive audio and video technology.  Names of all persons participating in this telemedicine service and their role in this encounter. Name: Dang Mathison. Role: Patient.  Name: [name] (via phone) Role: Spouse.  Name: Redmond Pulling, MS, [facility], [facility]. Role: Counselor.       Redmond Pulling 05/30/2018 3:03 AM    Redmond Pulling, MS, [facility], [facility] Triage Specialist [phone]

## 2018-05-30 NOTE — ED Notes (Signed)
Pt eating breakfast at bedside, calm at this time

## 2018-05-31 NOTE — ED Notes (Signed)
Pt walked to restroom and took meds, snack given.

## 2018-05-31 NOTE — ED Notes (Deleted)
Lunch tray ordered 

## 2018-05-31 NOTE — ED Notes (Signed)
Pt had lunch and is now resting

## 2018-05-31 NOTE — BHH Counselor (Signed)
  Reassessment-  Pt was observed pacing the floor during the assessment and was agitated with questioning.  Pt stated "Ma'am do you feel like you want to harm yourself? Do you feel like you want to harm someone else?  I don't like these odd random questions.  Ma'am you are having too much fun asking me these stupid questions.  I'm done playing gaming with you."  Pt denies SI/HI/A/V-hallucination and continues to answer all questions with a question.  Pt is oriented X3.  Pt continues to meet inpatient criteria.  Johnnie Goynes L. Deepika Decatur, MS, [facility], [facility] Therapeutic Triage Specialist  [phone]

## 2018-05-31 NOTE — ED Notes (Signed)
Pt out of room to make phone call. Made brief eye contact but conversative. States its ok if phone not able to be used. RN reassured him it is. He left brief voicemail for someone.

## 2018-05-31 NOTE — ED Notes (Signed)
Regular Diet was ordered for Lunch, and patient was given snack and drink.

## 2018-05-31 NOTE — ED Notes (Signed)
Pt came out of room and used restroom and made phone call. Did not speak or make eye contact with staff

## 2018-05-31 NOTE — ED Notes (Signed)
Pt's husband called wanting update. RN called him back. He wanted to know if he could bring him snacks, food, clothes, hygiene. RN reassured him that we are providing all these things.

## 2018-05-31 NOTE — ED Notes (Signed)
Pt is taking a shower at this time. 

## 2018-06-01 ENCOUNTER — Encounter (HOSPITAL_COMMUNITY): Payer: Self-pay | Admitting: Registered Nurse

## 2018-06-01 MED ORDER — HYDROXYZINE HCL 25 MG PO TABS
25.0000 mg | ORAL_TABLET | Freq: Four times a day (QID) | ORAL | Status: DC | PRN
  Administered 2018-06-01: 25 mg via ORAL
  Filled 2018-06-01: qty 1

## 2018-06-01 NOTE — ED Notes (Addendum)
Pt received lunch tray family at bedside 

## 2018-06-01 NOTE — ED Notes (Signed)
Patient has medium size pimple on the back of neck noted to be red with no drainage; Pt want area to be monitored-Monique,RN

## 2018-06-01 NOTE — ED Notes (Signed)
Pt in bed sitter at bedside.

## 2018-06-01 NOTE — Progress Notes (Signed)
CSW completed [facility] hospital application and has faxed the forms to [facility] ED for EDP to sign. Once signed, they will be sent back to [facility] at [phone]. Application will be sent to the [place] in the AM.   Wells Guiles, LCSW, LCAS Disposition CSW [facility] BHH/TTS [phone]

## 2018-06-01 NOTE — ED Notes (Signed)
Lunch tray ordered 

## 2018-06-01 NOTE — ED Notes (Addendum)
Patient got upset due to another patient in unit acting out; Patient came to staff defense and had to be talked down and redirected back to his room; pt asked for something for anxiety; EDP notified and new orders placed-Monique,RN

## 2018-06-01 NOTE — ED Notes (Signed)
Patient responding to internal stimuli, laughing and chatting with the voices he hears.

## 2018-06-01 NOTE — Progress Notes (Signed)
Morning medications given, when asked how patient was feeling he responded "I don't want to talk about it." Pt assured that when he was ready to talk about it, that this RN would listen. Pt denies pain, will continue to monitor.

## 2018-06-01 NOTE — Consult Note (Signed)
  Tele Assessment   Joshua Davila, 38 y.o., male patient presented to [facility] under IVC by his husband with complaints of delusional thinking and paranoia.  Patient seen via telepsych by this provider; chart reviewed and consulted with Dr. Lucianne Muss on 06/01/18.  On evaluation [name] is not a good historian.  Patient does not answer questions directly.  When asked how he was feeling patient responded "This place is uhm; you're not helping.  I need to be back on the street."   Patient does state when asked about sleep "I don't get no sleep; just a little bit of sleep."  When asked where he lived and if he lived with anyone patient responded "You need to go to the house and see how many live there.  Some one check tell who live."  Patient asked if there was anyone that could be called that I could speak with patient responded "I don't wish to speak with anyone there.  My husband coming [phone] 1 is visiting hours. .  When asked if he was hearing or seeing things no one else could (hallucinations) patient responded "No do you?  I told you this light is to bright and I can't see anything."  Patient constantly pacing up and down beside bed.  Patient asked if he had been feeling restless "yes there is no where to walk in here."  Patient denies suicidal/self-harm/homicidal ideation, psychosis, and paranoia.    During evaluation PETTER SWARD is pacing back and froth beside bed during the entire assessment;  he is alert/oriented x 3; calm/cooperative; but gets upset when asked about psychosis or paranoia.  Patient is speaking in a clear tone at moderate volume, and normal pace; with no eye contact.  His thought process is disorganized.  Patient denies suicidal/self-harm/homicidal ideation, psychosis, and paranoia; but unable to answer all questions appropriately; pacing constantly and reporting lack of sleep.   Spoke with patient's nurse Aundra Millet, RN) and was informed that patient doesn't talk much but when he does it  has been appropriate.  States that patient has been sitting in room resting mostly; sitter also informed no abnormal behavior has been noticed.      Recommendations:  Continue to seek inpatient psychiatric treatment.  Continue current medication  ARIPiprazole (ABILIFY) tablet 10 mg  atorvastatin (LIPITOR) tablet 20 mg  gabapentin (NEURONTIN) capsule 300 mg  paliperidone (INVEGA) 24 hr tablet 6 mg    Disposition: Recommend psychiatric Inpatient admission when medically cleared.   Spoke with Aundra Millet, RN; informed of above recommendation and disposition; states she will inform EDP   Assunta Found, NP

## 2018-06-01 NOTE — ED Notes (Signed)
Patient ambulatory to RN desk requesting to "go out and see the sunshine". Security called to escort pt to window, and then patient was provided with two warm blankets and he responded "Nevermind, I would rather take these and enjoy their warmth. I can go on a walk tomorrow." Patient will notify staff if he changes his mind and would like to go on a walk.

## 2018-06-02 ENCOUNTER — Other Ambulatory Visit: Payer: Self-pay

## 2018-06-02 NOTE — Progress Notes (Signed)
Pt accepted to [facility], [place]. [address], 1st Floor, [address]2 Beverly Gust, MD is the accepting and attending provider Call report to [phone] or [phone] [facility] ED notified Pt is IVC'd  Pt may be transported to [facility] as soon as transport can be arranged.  Joshua Davila. [name], MSW, LCSWA Disposition Clinical Social Work [phone] (cell) [phone] (office)

## 2018-06-02 NOTE — ED Notes (Signed)
Deputy transporting pt to [place] - ALL belongings - 2 labeled belongings bags - Deputy - Pt aware.

## 2018-06-02 NOTE — Progress Notes (Signed)
Updated records sent to [facility] for review for potential admission.  Joshua Davila. [name], MSW, LCSWA Disposition Clinical Social Work [phone] (cell) [phone] (office)

## 2018-06-02 NOTE — ED Notes (Signed)
Lunch tray ordered 

## 2018-06-02 NOTE — ED Notes (Signed)
Pt on phone at nurses' desk. 

## 2018-06-03 NOTE — ED Notes (Signed)
Pt's spouse - Dwayne [name] -- called to check on patient-- explained that he is the legal guardian with papers on file here and at the courthouse-  WOULD LIKE TO BE NOTIFIED OF ANY TRANSFERS TO OTHER FACILITIES FOR FUTURE ADMISSIONS.  Explained to spouse that I will pass this on and make this note.

## 2018-08-22 ENCOUNTER — Other Ambulatory Visit: Payer: Self-pay

## 2018-08-22 ENCOUNTER — Encounter (HOSPITAL_COMMUNITY): Payer: Self-pay

## 2018-08-22 ENCOUNTER — Observation Stay (HOSPITAL_COMMUNITY)
Admission: AD | Admit: 2018-08-22 | Discharge: 2018-08-24 | Disposition: A | Discharge status: Other Acute Inpatient Hospital | Payer: Medicare Other | Attending: Psychiatry | Admitting: Psychiatry

## 2018-08-22 DIAGNOSIS — F1990 Other psychoactive substance use, unspecified, uncomplicated: Secondary | ICD-10-CM | POA: Diagnosis not present

## 2018-08-22 DIAGNOSIS — F17210 Nicotine dependence, cigarettes, uncomplicated: Secondary | ICD-10-CM | POA: Insufficient documentation

## 2018-08-22 DIAGNOSIS — F1520 Other stimulant dependence, uncomplicated: Secondary | ICD-10-CM | POA: Diagnosis present

## 2018-08-22 DIAGNOSIS — Z880 Allergy status to penicillin: Secondary | ICD-10-CM | POA: Insufficient documentation

## 2018-08-22 DIAGNOSIS — Z881 Allergy status to other antibiotic agents status: Secondary | ICD-10-CM | POA: Insufficient documentation

## 2018-08-22 DIAGNOSIS — F649 Gender identity disorder, unspecified: Secondary | ICD-10-CM | POA: Insufficient documentation

## 2018-08-22 DIAGNOSIS — Z882 Allergy status to sulfonamides status: Secondary | ICD-10-CM | POA: Diagnosis not present

## 2018-08-22 DIAGNOSIS — F4310 Post-traumatic stress disorder, unspecified: Secondary | ICD-10-CM | POA: Diagnosis not present

## 2018-08-22 DIAGNOSIS — F250 Schizoaffective disorder, bipolar type: Principal | ICD-10-CM | POA: Diagnosis present

## 2018-08-22 MED ORDER — GABAPENTIN 300 MG PO CAPS
300.0000 mg | ORAL_CAPSULE | Freq: Three times a day (TID) | ORAL | Status: DC
  Administered 2018-08-23 (×3): 300 mg via ORAL
  Filled 2018-08-22 (×3): qty 1

## 2018-08-22 MED ORDER — OLANZAPINE 10 MG PO TBDP
10.0000 mg | ORAL_TABLET | ORAL | Status: AC
  Administered 2018-08-22: 10 mg via ORAL
  Filled 2018-08-22: qty 1

## 2018-08-22 MED ORDER — GABAPENTIN 300 MG PO CAPS
300.0000 mg | ORAL_CAPSULE | ORAL | Status: AC
  Administered 2018-08-22: 300 mg via ORAL
  Filled 2018-08-22: qty 1

## 2018-08-22 MED ORDER — LORAZEPAM 2 MG/ML IJ SOLN
2.0000 mg | Freq: Once | INTRAMUSCULAR | Status: DC
  Filled 2018-08-22: qty 1

## 2018-08-22 MED ORDER — DIPHENHYDRAMINE HCL 50 MG/ML IJ SOLN
50.0000 mg | Freq: Once | INTRAMUSCULAR | Status: DC
  Filled 2018-08-22: qty 1

## 2018-08-22 MED ORDER — ZIPRASIDONE MESYLATE 20 MG IM SOLR
20.0000 mg | Freq: Once | INTRAMUSCULAR | Status: DC
  Filled 2018-08-22: qty 20

## 2018-08-22 MED ORDER — DIPHENHYDRAMINE HCL 25 MG PO CAPS
50.0000 mg | ORAL_CAPSULE | ORAL | Status: AC
  Administered 2018-08-22: 50 mg via ORAL
  Filled 2018-08-22: qty 2

## 2018-08-22 MED ORDER — NICOTINE POLACRILEX 2 MG MT GUM
2.0000 mg | CHEWING_GUM | OROMUCOSAL | Status: DC | PRN
  Administered 2018-08-22: 2 mg via ORAL
  Filled 2018-08-22: qty 1
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
## 2018-08-22 NOTE — Progress Notes (Signed)
[facility] NOVEL CORONAVIRUS (COVID-19) DAILY CHECK-OFF SYMPTOMS - answer yes or no to each - every day NO YES  Have you had a fever in the past 24 hours?  . Fever (Temp > 37.80C / 100F) X   Have you had any of these symptoms in the past 24 hours? . New Cough .  Sore Throat  .  Shortness of Breath .  Difficulty Breathing .  Unexplained Body Aches   X   Have you had any one of these symptoms in the past 24 hours not related to allergies?   . Runny Nose .  Nasal Congestion .  Sneezing   X   If you have had runny nose, nasal congestion, sneezing in the past 24 hours, has it worsened?  X   EXPOSURES - check yes or no X   Have you traveled outside the state in the past 14 days?  X   Have you been in contact with someone with a confirmed diagnosis of COVID-19 or PUI in the past 14 days without wearing appropriate PPE?  X   Have you been living in the same home as a person with confirmed diagnosis of COVID-19 or a PUI (household contact)?    X   Have you been diagnosed with COVID-19?    X              What to do next: Answered NO to all: Answered YES to anything:   Proceed with unit schedule Follow the [facility] Inpatient Flowsheet.   
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
## 2018-08-22 NOTE — Progress Notes (Signed)
Pt arrived involuntarily via [employer]. Pt's mood/affect is irritable and uncooperative. This writer was unable to fully assess and complete documentation on this pt d/t pt not being willing to participate.   Upon arrival pt stated to this writer that he was not going to talk to this Clinical research associate and that he was not going to answer anymore questions that he already answered the covid questions. Pt asked where this writer's "cool" friends were, then stated don't touch me!

## 2018-08-23 ENCOUNTER — Encounter (HOSPITAL_COMMUNITY): Payer: Self-pay | Admitting: Registered Nurse

## 2018-08-23 DIAGNOSIS — F250 Schizoaffective disorder, bipolar type: Principal | ICD-10-CM

## 2018-08-23 NOTE — Progress Notes (Signed)
Patient ID: Joshua Davila, male   DOB: 09-Dec-1980, 38 y.o.   MRN: [id number]  D: Patient has been in room tonight talking to self. Patient disorganized when speaking to him and conversation is hard to follow. Some labile behavior since admitted to OBS and would not talk with admitted nurse except to say not to touch him. Patient saying in room mostly but did walk the hall some. Zyprexa, benadryl, and gabapentin given at around 8 pm due to psychosis and agitation. He wouldn't let undersigned put on his armband but he did it himself. Did order some nicotine gum for him per request and given some snacks per request. Denies any SI/HI. Very preoccupied and responding to hallucinations. A: Staff will monitor q 15 minutes on OBS unit for safety R: In bed asleep at present.

## 2018-08-23 NOTE — Progress Notes (Addendum)
Pt accepted to [facility]   Estill Cotta, MD, is the accepting/attending provider.  Call report to [phone] [facility] notified.   Pt is IVC'd.  Pt may be transported by [facility] Pt scheduled  to arrive at [facility] on 08/24/18 after 8 AM.  Carney Bern T. [name], MSW, LCSW Disposition Clinical Social Work [phone] (cell) [phone] (office)

## 2018-08-23 NOTE — Consult Note (Addendum)
[facility] Observation Progress Note  08/23/2018 1:21 PM Joshua Davila  MRN:  [id number] Subjective:  "Don't appreciate  these question.  Your sound like a smart aleck.   Objective: Patient seen face to face by this provider, Dr. Sharma Covert; and Chart reviewed on 08/23/2018.  On evaluation patient is laying in bed refuses to open eyes or set up for assessment interview.  Patient refuses to answer questions, irritable, and more agitation with each question.     Principal Problem: Schizoaffective disorder, bipolar type (HCC) Diagnosis:  Principal Problem:   Schizoaffective disorder, bipolar type (HCC) Active Problems:   Amphetamine and psychostimulant dependence (HCC)   PTSD (post-traumatic stress disorder)   Methamphetamine use disorder, severe (HCC)  Total Time spent with patient: 30 minutes  Past Psychiatric History: schizoaffective disorder, meth abuse   Past Medical History:  Past Medical History:  Diagnosis Date  . Depression   . Gender identity disorder   . Liver disorder   . PTSD (post-traumatic stress disorder)   . Schizoaffective disorder (HCC)    History reviewed. No pertinent surgical history. Family History:  Family History  Problem Relation Age of Onset  . Multiple sclerosis Mother   . Mental illness Neg Hx    Family Psychiatric  History: None Social History:  Social History   Substance and Sexual Activity  Alcohol Use Yes   Comment: occasional     Social History   Substance and Sexual Activity  Drug Use Yes  . Types: Cocaine, Methamphetamines, Marijuana   Comment: last useof methamphetamines and marijuana yesterday    Social History   Socioeconomic History  . Marital status: Single    Spouse name: Not on file  . Number of children: Not on file  . Years of education: Not on file  . Highest education level: Not on file  Occupational History  . Not on file  Social Needs  . Financial resource strain: Not on file  . Food insecurity:    Worry: Not on file     Inability: Not on file  . Transportation needs:    Medical: Not on file    Non-medical: Not on file  Tobacco Use  . Smoking status: Current Every Day Smoker    Packs/day: 1.00    Types: Cigarettes  . Smokeless tobacco: Never Used  Substance and Sexual Activity  . Alcohol use: Yes    Comment: occasional  . Drug use: Yes    Types: Cocaine, Methamphetamines, Marijuana    Comment: last useof methamphetamines and marijuana yesterday  . Sexual activity: Not Currently  Lifestyle  . Physical activity:    Days per week: Not on file    Minutes per session: Not on file  . Stress: Not on file  Relationships  . Social connections:    Talks on phone: Not on file    Gets together: Not on file    Attends religious service: Not on file    Active member of club or organization: Not on file    Attends meetings of clubs or organizations: Not on file    Relationship status: Not on file  Other Topics Concern  . Not on file  Social History Narrative  . Not on file    Sleep: Good  Appetite:  Good  Current Medications: Current Facility-Administered Medications  Medication Dose Route Frequency Provider Last Rate Last Dose  . diphenhydrAMINE (BENADRYL) injection 50 mg  50 mg Intramuscular Once Charm Rings, NP      . gabapentin (NEURONTIN)  capsule 300 mg  300 mg Oral TID Charm Rings, NP   300 mg at 08/23/18 1311  . LORazepam (ATIVAN) injection 2 mg  2 mg Intramuscular Once Charm Rings, NP      . nicotine polacrilex (NICORETTE) gum 2 mg  2 mg Oral PRN Charm Rings, NP   2 mg at 08/22/18 2022  . ziprasidone (GEODON) injection 20 mg  20 mg Intramuscular Once Charm Rings, NP        Lab Results: No results found for this or any previous visit (from the past 48 hour(s)).  Blood Alcohol level:  Lab Results  Component Value Date   ETH <10 05/29/2018   ETH <10 12/01/2017    Physical Findings: AIMS: Facial and Oral Movements Muscles of Facial Expression: None, normal Lips  and Perioral Area: None, normal Jaw: None, normal Tongue: None, normal,Extremity Movements Upper (arms, wrists, hands, fingers): None, normal Lower (legs, knees, ankles, toes): None, normal, Trunk Movements Neck, shoulders, hips: None, normal, Overall Severity Severity of abnormal movements (highest score from questions above): None, normal Incapacitation due to abnormal movements: None, normal Patient's awareness of abnormal movements (rate only patient's report): No Awareness, Dental Status Current problems with teeth and/or dentures?: No Does patient usually wear dentures?: No  CIWA:   N/A COWS:   N/A  Musculoskeletal: Strength & Muscle Tone: within normal limits Gait & Station: normal Patient leans: N/A  Psychiatric Specialty Exam: Physical Exam  Nursing note and vitals reviewed. Constitutional: He is oriented to person, place, and time. He appears well-developed and well-nourished.  HENT:  Head: Normocephalic and atraumatic.  Neck: Normal range of motion.  Respiratory: Effort normal.  Musculoskeletal: Normal range of motion.  Neurological: He is alert and oriented to person, place, and time.  Skin: Skin is warm and dry.  Psychiatric: His speech is normal. His affect is labile. He is agitated. Thought content is delusional. Cognition and memory are impaired. He expresses impulsivity.    Review of Systems  Unable to perform ROS: Other  Patient unwilling to participate in interview.   There were no vitals taken for this visit.There is no height or weight on file to calculate BMI.  General Appearance: Casual  Eye Contact:  None  Speech:  Pressured  Volume:  Increased  Mood:  Angry and Irritable  Affect:  Congruent  Thought Process:  Coherent  Orientation:  Full (Time, Place, and Person)  Thought Content:  Hallucinations: Auditory, Paranoid Ideation and Tangential  Suicidal Thoughts:  Yes.  without intent/plan  Homicidal Thoughts:  Yes.  without intent/plan  Memory:   Immediate;   Poor Recent;   Poor Remote;   Poor  Judgement:  Impaired  Insight:  Lacking  Psychomotor Activity:  Increased  Concentration:  Concentration: Poor and Attention Span: Poor  Recall:  Poor  Fund of Knowledge:  Fair  Language:  Fair  Akathisia:  No  Handed:  Right  AIMS (if indicated):   N/A  Assets:  Leisure Time Physical Health Resilience Social Support  ADL's:  Intact  Cognition:  Impaired,  Moderate  Sleep:   N/A    Treatment Plan Summary: Daily contact with patient to assess and evaluate symptoms and progress in treatment, Medication management and Plan Continue with current treatment plan.  continue to search for inpatiet psychiatriic treatment bed  Shuvon Rankin, NP 08/23/2018, 1:21 PM   Patient seen face-to-face for psychiatric evaluation, chart reviewed and case discussed with the physician extender and developed treatment plan.  Reviewed the information documented and agree with the treatment plan.  Juanetta BeetsJacqueline Neema Fluegge, DO 08/23/18 5:23 PM

## 2018-08-23 NOTE — Progress Notes (Signed)
Patient refused vital signs with fists clenched saying, "just let me sleep."

## 2018-08-23 NOTE — Progress Notes (Signed)
Patient ID: Joshua Davila, male   DOB: [birth year], 38 y.o.   MRN: [id number]  Refused lab work and vitals this am

## 2018-08-23 NOTE — BH Assessment (Signed)
Patient resting quietly in bed upon my approach. Patient irritable but cooperative with PO medication administration. Patient offered support and encouragement. Patient denies SI, Hi and AVH. Patient is IVC at this time.

## 2018-08-24 NOTE — Progress Notes (Signed)
Patient ID: Joshua Davila, male   DOB: [birth year], 38 y.o.   MRN: [id number] Report called to RN at [facility]. [employer] called for transport.

## 2018-08-24 NOTE — Progress Notes (Signed)
Patient ID: Joshua Davila, male   DOB: 1980/09/14, 38 y.o.   MRN: [id number]  [place] NOVEL CORONAVIRUS (COVID-19) DAILY CHECK-OFF SYMPTOMS - answer yes or no to each - every day NO YES  Have you had a fever in the past 24 hours?  . Fever (Temp > 37.80C / 100F) X   Have you had any of these symptoms in the past 24 hours? . New Cough .  Sore Throat  .  Shortness of Breath .  Difficulty Breathing .  Unexplained Body Aches   X   Have you had any one of these symptoms in the past 24 hours not related to allergies?   . Runny Nose .  Nasal Congestion .  Sneezing   X   If you have had runny nose, nasal congestion, sneezing in the past 24 hours, has it worsened?  X   EXPOSURES - check yes or no X   Have you traveled outside the state in the past 14 days?  X   Have you been in contact with someone with a confirmed diagnosis of COVID-19 or PUI in the past 14 days without wearing appropriate PPE?  X   Have you been living in the same home as a person with confirmed diagnosis of COVID-19 or a PUI (household contact)?    X   Have you been diagnosed with COVID-19?    X              What to do next: Answered NO to all: Answered YES to anything:   Proceed with unit schedule Follow the [facility] Inpatient Flowsheet.

## 2018-08-24 NOTE — Progress Notes (Signed)
Patient ID: Joshua Davila, male   DOB: 1980-07-13, 38 y.o.   MRN: [id number]   Patient discharged and transferred to [facility]. Transported by [employer].

## 2018-08-24 NOTE — Progress Notes (Signed)
DAR. D: Pt continues to be very flat and depressed on the unit. Pt also continues to be very isolative. Took his medication as scheduled, denies pain. Pt reported being negative SI/HI, no AH/VH noted. A: 15 min checks continued for patient safety. R: Pts safety maintained.

## 2018-08-24 NOTE — Discharge Summary (Addendum)
  Patient to be transferred to [facility] for inpatient psychiatric treatment  Shuvon B. Rankin, NP  Patient's chart reviewed. Reviewed the information documented and agree with the treatment plan.  Juanetta Beets, DO 08/24/18 2:12 PM

## 2018-08-24 NOTE — BH Assessment (Signed)
[facility] Assessment Progress Note  At 08:30 this writer called pt's spouse, [name] [phone]), and notified him of pt's impending transfer to [facility].  When making the referral on 08/23/2018, I included Mr Hill's letter of guardianship.  I also provided Mr Loleta Chance with contact information for [facility].  Pt's nurse, Selena Batten, has been notified.  Doylene Canning, [place] [facility] Coordinator [phone]

## 2018-08-28 DIAGNOSIS — F200 Paranoid schizophrenia: Secondary | ICD-10-CM | POA: Diagnosis not present

## 2018-08-28 DIAGNOSIS — F4310 Post-traumatic stress disorder, unspecified: Secondary | ICD-10-CM | POA: Diagnosis not present

## 2018-09-10 ENCOUNTER — Encounter (HOSPITAL_COMMUNITY): Payer: Self-pay

## 2018-09-10 ENCOUNTER — Other Ambulatory Visit: Payer: Self-pay

## 2018-09-10 ENCOUNTER — Ambulatory Visit (HOSPITAL_COMMUNITY)
Admission: AD | Admit: 2018-09-10 | Discharge: 2018-09-10 | Disposition: A | Discharge status: Home / Self Care | Payer: Medicare Other | Attending: Psychiatry | Admitting: Psychiatry

## 2018-09-10 ENCOUNTER — Emergency Department (HOSPITAL_COMMUNITY)
Admission: EM | Admit: 2018-09-10 | Discharge: 2018-09-10 | Disposition: A | Discharge status: Home / Self Care | Payer: Medicare Other | Attending: Emergency Medicine | Admitting: Emergency Medicine

## 2018-09-10 DIAGNOSIS — F17210 Nicotine dependence, cigarettes, uncomplicated: Secondary | ICD-10-CM | POA: Diagnosis not present

## 2018-09-10 DIAGNOSIS — F99 Mental disorder, not otherwise specified: Secondary | ICD-10-CM | POA: Diagnosis not present

## 2018-09-10 DIAGNOSIS — F329 Major depressive disorder, single episode, unspecified: Secondary | ICD-10-CM | POA: Insufficient documentation

## 2018-09-10 DIAGNOSIS — Z79899 Other long term (current) drug therapy: Secondary | ICD-10-CM | POA: Diagnosis not present

## 2018-09-10 DIAGNOSIS — F489 Nonpsychotic mental disorder, unspecified: Secondary | ICD-10-CM

## 2018-09-10 DIAGNOSIS — F1994 Other psychoactive substance use, unspecified with psychoactive substance-induced mood disorder: Secondary | ICD-10-CM | POA: Diagnosis not present

## 2018-09-10 DIAGNOSIS — Z046 Encounter for general psychiatric examination, requested by authority: Secondary | ICD-10-CM | POA: Diagnosis present

## 2018-09-10 DIAGNOSIS — F1520 Other stimulant dependence, uncomplicated: Secondary | ICD-10-CM | POA: Diagnosis present

## 2018-09-10 LAB — COMPREHENSIVE METABOLIC PANEL
ALT: 29 U/L (ref 0–44)
AST: 26 U/L (ref 15–41)
Albumin: 4.6 g/dL (ref 3.5–5.0)
Alkaline Phosphatase: 47 U/L (ref 38–126)
Anion gap: 10 (ref 5–15)
BUN: 10 mg/dL (ref 6–20)
CO2: 24 mmol/L (ref 22–32)
Calcium: 9.3 mg/dL (ref 8.9–10.3)
Chloride: 105 mmol/L (ref 98–111)
Creatinine, Ser: 0.8 mg/dL (ref 0.61–1.24)
GFR calc Af Amer: >60 mL/min (ref >60)
GFR calc non Af Amer: >60 mL/min (ref >60)
Glucose, Bld: 102 mg/dL — ABNORMAL HIGH (ref 70–99)
Potassium: 3.8 mmol/L (ref 3.5–5.1)
Sodium: 139 mmol/L (ref 135–145)
Total Bilirubin: 0.4 mg/dL (ref 0.3–1.2)
Total Protein: 7.4 g/dL (ref 6.5–8.1)

## 2018-09-10 LAB — CBC
HCT: 36.4 % — ABNORMAL LOW (ref 39.0–52.0)
Hemoglobin: 12.6 g/dL — ABNORMAL LOW (ref 13.0–17.0)
MCH: 31.1 pg (ref 26.0–34.0)
MCHC: 34.6 g/dL (ref 30.0–36.0)
MCV: 89.9 fL (ref 80.0–100.0)
Platelets: 214 10*3/uL (ref 150–400)
RBC: 4.05 MIL/uL — ABNORMAL LOW (ref 4.22–5.81)
RDW: 14.9 % (ref 11.5–15.5)
WBC: 8.3 10*3/uL (ref 4.0–10.5)
nRBC: 0 % (ref 0.0–0.2)

## 2018-09-10 LAB — SALICYLATE LEVEL: Salicylate Lvl: <7 mg/dL (ref 2.8–30.0)

## 2018-09-10 LAB — RAPID URINE DRUG SCREEN, HOSP PERFORMED
Amphetamines: POSITIVE — AB
Barbiturates: NOT DETECTED
Benzodiazepines: NOT DETECTED
Cocaine: NOT DETECTED
Opiates: NOT DETECTED
Tetrahydrocannabinol: POSITIVE — AB

## 2018-09-10 LAB — ACETAMINOPHEN LEVEL: Acetaminophen (Tylenol), Serum: <10 ug/mL — ABNORMAL LOW (ref 10–30)

## 2018-09-10 LAB — ETHANOL: Alcohol, Ethyl (B): <10 mg/dL (ref <10)

## 2018-09-10 MED ORDER — NICOTINE 21 MG/24HR TD PT24
21.0000 mg | MEDICATED_PATCH | Freq: Every day | TRANSDERMAL | Status: DC
  Administered 2018-09-10: 21 mg via TRANSDERMAL
  Filled 2018-09-10: qty 1

## 2018-09-10 MED ORDER — LORAZEPAM 2 MG/ML IJ SOLN: 2.0000 mg | INTRAMUSCULAR | Status: DC | PRN

## 2018-09-10 MED ORDER — GABAPENTIN 300 MG PO CAPS
300.0000 mg | ORAL_CAPSULE | Freq: Two times a day (BID) | ORAL | Status: DC
  Administered 2018-09-10: 300 mg via ORAL
  Filled 2018-09-10: qty 1

## 2018-09-10 MED ORDER — IBUPROFEN 200 MG PO TABS: 600.0000 mg | ORAL_TABLET | Freq: Three times a day (TID) | ORAL | Status: DC | PRN

## 2018-09-10 MED ORDER — ONDANSETRON HCL 4 MG PO TABS: 4.0000 mg | ORAL_TABLET | Freq: Three times a day (TID) | ORAL | Status: DC | PRN

## 2018-09-10 MED ORDER — ALUM & MAG HYDROXIDE-SIMETH 200-200-20 MG/5ML PO SUSP: 30.0000 mL | Freq: Four times a day (QID) | ORAL | Status: DC | PRN

## 2018-09-10 NOTE — ED Notes (Signed)
Two patient belongings bags taken back and placed in TCU locker 29.

## 2018-09-10 NOTE — ED Notes (Signed)
Bed: WTR6 Expected date:  Expected time:  Means of arrival:  Comments: 

## 2018-09-10 NOTE — ED Provider Notes (Signed)
Emergency Department Provider Note   I have reviewed the triage vital signs and the nursing notes.   HISTORY  Chief Complaint IVC   HPI Joshua Davila is a 38 y.o. male with multiple medical problems documented below who presents the emergency department today with homicidal ideations and a history of the same.  Has history of schizophrenia and bipolar disorder has not been on medications.  Patient was recently at [facility] but presumably not been taking his medicine since then.  Has been involuntarily committed by his wife but was sent here for medical clearance prior to admission.   No other associated or modifying symptoms.    Past Medical History:  Diagnosis Date  . Depression   . Gender identity disorder   . Liver disorder   . PTSD (post-traumatic stress disorder)   . Schizoaffective disorder [facility])     Patient Active Problem List   Diagnosis Date Noted  . Aggressive behavior   . Alcohol use disorder, severe, in early remission, in controlled environment ([place]) 04/22/2017  . Polysubstance abuse ([place])   . Schizoaffective disorder, bipolar type ([place]) 10/02/2015  . Involuntary commitment 10/02/2015  . Cannabis use disorder, moderate, dependence ([place]) 09/16/2015  . Opioid use disorder, moderate, dependence ([place]) 09/16/2015  . Substance or medication-induced bipolar and related disorder with onset during intoxication ([place]) 09/16/2015  . 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 Methamphetamine use disorder, severe ([place]) 06/20/2015  . Benzodiazepine dependence ([place]) 10/04/2013  . PTSD (post-traumatic stress disorder) 10/03/2013  . Amphetamine and psychostimulant dependence ([place]) 10/02/2013    History reviewed. No pertinent surgical history.  Current Outpatient Rx  . Order #: [id number] Class: No Print  . Order #: [id number] Class: No Print    Allergies Penicillins and Sulfa antibiotics  Family History   Problem Relation Age of Onset  . Multiple sclerosis Mother   . Mental illness Neg Hx     Social History Social History   Tobacco Use  . Smoking status: Current Every Day Smoker    Packs/day: 1.00    Types: Cigarettes  . Smokeless tobacco: Never Used  Substance Use Topics  . Alcohol use: Yes    Comment: occasional  . Drug use: Yes    Types: Cocaine, Methamphetamines, Marijuana    Comment: last useof methamphetamines and marijuana yesterday    Review of Systems  All other systems negative except as documented in the HPI. All pertinent positives and negatives as reviewed in the HPI. ____________________________________________   PHYSICAL EXAM:  VITAL SIGNS: ED Triage Vitals  Enc Vitals Group     BP 09/10/18 0128 127/88     Pulse Rate 09/10/18 0128 80     Resp 09/10/18 0128 18     Temp 09/10/18 0128 97.7 F (36.5 C)     Temp Source 09/10/18 0128 Oral     SpO2 09/10/18 0128 100 %     Weight --      Height --      Head Circumference --      Peak Flow --      Pain Score 09/10/18 0129 0     Pain Loc --      Pain Edu? --      Excl. in [place]? --     Constitutional: Alert and but won't communicate aside from grunting. Apparently told nurse: "Get me food" just prior to my arrival. Well appearing and in no acute distress. Eyes: Conjunctivae are normal. PERRL. EOMI.  Head: Atraumatic. Nose: No congestion/rhinnorhea. Mouth/Throat: Mucous membranes are moist.  Oropharynx non-erythematous. Neck: No stridor.  No meningeal signs.   Cardiovascular: Normal rate, regular rhythm. Good peripheral circulation. Grossly normal heart sounds.   Respiratory: Normal respiratory effort.  No retractions. Lungs CTAB. Gastrointestinal: Soft and nontender. No distention.  Musculoskeletal: No lower extremity tenderness nor edema. No gross deformities of extremities. Neurologic:  Normal speech and language. No gross focal neurologic deficits are appreciated.  Skin:  Skin is warm, dry and intact.  No rash noted. Psych: difficult to assess as patient refuses to speak to me  ____________________________________________   LABS (all labs ordered are listed, but only abnormal results are displayed)  Labs Reviewed  COMPREHENSIVE METABOLIC PANEL - Abnormal; Notable for the following components:      Result Value   Glucose, Bld 102 (*)    All other components within normal limits  ACETAMINOPHEN LEVEL - Abnormal; Notable for the following components:   Acetaminophen (Tylenol), Serum <10 (*)    All other components within normal limits  CBC - Abnormal; Notable for the following components:   RBC 4.05 (*)    Hemoglobin 12.6 (*)    HCT 36.4 (*)    All other components within normal limits  RAPID URINE DRUG SCREEN, HOSP PERFORMED - Abnormal; Notable for the following components:   Amphetamines POSITIVE (*)    Tetrahydrocannabinol POSITIVE (*)    All other components within normal limits  ETHANOL  SALICYLATE LEVEL   ____________________________________________  INITIAL IMPRESSION / ASSESSMENT AND PLAN / ED COURSE  Low suspicion for medical causes for his psychiatric state. TTS consult. Will submit first exam.    Pertinent labs & imaging results that were available during my care of the patient were reviewed by me and considered in my medical decision making (see chart for details).  ____________________________________________  FINAL CLINICAL IMPRESSION(S) / ED DIAGNOSES  Final diagnoses:  None     MEDICATIONS GIVEN DURING THIS VISIT:  Medications  ibuprofen (ADVIL) tablet 600 mg (has no administration in time range)  ondansetron (ZOFRAN) tablet 4 mg (has no administration in time range)  alum & mag hydroxide-simeth (MAALOX/MYLANTA) 200-200-20 MG/5ML suspension 30 mL (has no administration in time range)  nicotine (NICODERM CQ - dosed in mg/24 hours) patch 21 mg (has no administration in time range)  LORazepam (ATIVAN) injection 2 mg (has no administration in time range)      NEW OUTPATIENT MEDICATIONS STARTED DURING THIS VISIT:  New Prescriptions   No medications on file    Note:  This note was prepared with assistance of Dragon voice recognition software. Occasional wrong-word or sound-a-like substitutions may have occurred due to the inherent limitations of voice recognition software.   Geroldine Esquivias, Barbara CowerJason, MD 09/10/18 [phone]
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
## 2018-09-10 NOTE — ED Notes (Signed)
Pt dressed out in wine scrubs, belongings placed in two bags and put in the cabinet under the ice machine, and wanded by security.

## 2018-09-10 NOTE — [facility] Counselor (Signed)
Per Collie Siad, RN pt is too somnolent to engage in TTS assessment. Pt to be assessed once alert, oriented and able to engage.   Vertell Novak, [place], [facility], [facility] Triage Specialist [phone]
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
## 2018-09-10 NOTE — ED Notes (Signed)
Urine drug screen positive for THC and amphetamines.

## 2018-09-10 NOTE — ED Notes (Signed)
Assisted over from triage to room 29 for ongoing monitoring. He was brought over in a wheelchair because he is not following directions and is acting tired or intoxicated. He is voluntarily mute. Sat on edge of bed to urinate, required some assistance from security, urine specimen obtained. Demanded something to eat. Has kept his eye closed throughout this process and is eating sandwich with eyes closed lying in bed. Will continue to monitor for safety and sitter at doorway.

## 2018-11-03 ENCOUNTER — Encounter (HOSPITAL_COMMUNITY): Payer: Self-pay | Admitting: Emergency Medicine

## 2018-11-03 ENCOUNTER — Observation Stay (HOSPITAL_COMMUNITY)
Admission: AD | Admit: 2018-11-03 | Discharge: 2018-11-03 | Disposition: A | Discharge status: Still In Hospital | Payer: Medicare Other | Attending: Psychiatry | Admitting: Psychiatry

## 2018-11-03 ENCOUNTER — Observation Stay (HOSPITAL_COMMUNITY)
Admission: AD | Admit: 2018-11-03 | Discharge: 2018-11-04 | Disposition: A | Discharge status: Home / Self Care | Payer: Medicare Other | Attending: Psychiatry | Admitting: Psychiatry

## 2018-11-03 ENCOUNTER — Other Ambulatory Visit: Payer: Self-pay

## 2018-11-03 DIAGNOSIS — F319 Bipolar disorder, unspecified: Principal | ICD-10-CM | POA: Insufficient documentation

## 2018-11-03 DIAGNOSIS — F17210 Nicotine dependence, cigarettes, uncomplicated: Secondary | ICD-10-CM | POA: Diagnosis not present

## 2018-11-03 DIAGNOSIS — F250 Schizoaffective disorder, bipolar type: Secondary | ICD-10-CM | POA: Insufficient documentation

## 2018-11-03 DIAGNOSIS — F1994 Other psychoactive substance use, unspecified with psychoactive substance-induced mood disorder: Secondary | ICD-10-CM | POA: Diagnosis not present

## 2018-11-03 DIAGNOSIS — F4310 Post-traumatic stress disorder, unspecified: Secondary | ICD-10-CM | POA: Diagnosis not present

## 2018-11-03 DIAGNOSIS — F259 Schizoaffective disorder, unspecified: Secondary | ICD-10-CM | POA: Insufficient documentation

## 2018-11-03 DIAGNOSIS — Z20828 Contact with and (suspected) exposure to other viral communicable diseases: Secondary | ICD-10-CM | POA: Diagnosis not present

## 2018-11-03 MED ORDER — HYDROXYZINE HCL 50 MG PO TABS: 50.0000 mg | ORAL_TABLET | Freq: Three times a day (TID) | ORAL | Status: DC | PRN

## 2018-11-03 MED ORDER — ACETAMINOPHEN 325 MG PO TABS: 650.0000 mg | ORAL_TABLET | Freq: Four times a day (QID) | ORAL | Status: DC | PRN

## 2018-11-03 MED ORDER — TRAZODONE HCL 50 MG PO TABS: 50.0000 mg | ORAL_TABLET | Freq: Every evening | ORAL | Status: DC | PRN

## 2018-11-03 MED ORDER — ALUM & MAG HYDROXIDE-SIMETH 200-200-20 MG/5ML PO SUSP: 30.0000 mL | ORAL | Status: DC | PRN

## 2018-11-03 MED ORDER — MAGNESIUM HYDROXIDE 400 MG/5ML PO SUSP: 30.0000 mL | Freq: Every day | ORAL | Status: DC | PRN

## 2018-11-03 NOTE — Plan of Care (Signed)
[place] Observation Crisis Plan  Reason for Crisis Plan:  Crisis Stabilization   Plan of Care:  Referral for Inpatient Hospitalization  Family Support:      Current Living Environment:  Living Arrangements: Spouse/significant other, Non-relatives/Friends  Insurance:   Hospital Account    Name Acct ID Class Status Primary Coverage   Joshua Davila, Joshua Davila [id number] [place] - MEDICARE PART A AND B        Guarantor Account (for Hospital Account [id number])    Name Relation to Pt Service Area Active? Acct Type   Joshua Davila [facility]   Address Phone       [address] [place], [place] [address] [phone])          Coverage Information (for Hospital Account [id number])    F/O Payor/Plan Precert #   MEDICARE/MEDICARE PART A AND B    Subscriber Subscriber #   Joshua Davila [id number]   Address Phone   [address] [place], [place] [address]       Legal Guardian:  Legal Guardian: Other:(Per pt, self. )  Primary Care Provider:  Clinic, Joshua Davila  Current Outpatient Providers:  Joshua Davila  Psychiatrist:  Name of Psychiatrist: UTA  Counselor/Therapist:  Name of Therapist: UTA  Compliant with Medications:  No  Additional Information:   Joshua Davila 8/13/202010:37 PM

## 2018-11-03 NOTE — H&P (Signed)
Behavioral Health Medical Screening Exam  Joshua Davila is an 38 y.o. malethat presents to [facility] under IVC.  upon approach patient is sitting in a chair eating and making little to no eye contact. Patient appearance is disheveled, mood is euthymic and his affect is labile and full.  The following assessment is per the TTS counselor: Joshua Davila is an 38 y.o., who presents involuntary and unaccompanied to [facility]. Pt identifies as male, pt was unclear of preferred pronouns to be used. Clinician asked the pt, "what brought you to the hospital?" Pt reported, came here to talk then going back home." Pt reported, retired from [employer] (did not disclose which branch.) Pt reported, not wanting to discuss event what occurred in the [employer]. Pt reported, seeing and hearing things others can not because they are not in the same room. Clinician asked clarifying questions, pt did not want to discuss topic further. Pt reported, having children Joshua Davila and Joshua Davila) by Joshua Davila. Pt wanted clinician and NP to find out his kids location. Pt reported, in [place] women go through a certain process and have beards. Pt reported, living with "three mentally handicapped people" and taking care of their daily needs. Pt reported, being the mother of the three handicapped individuals. Pt did not answer many questions during the assessment but discussed the [place] and handicapped individuals. Pt denies, SI.   Pt was IVC by spouse Joshua Davila, [phone]). Per IVC paperwork: "Diagnosed with Schizophrenia, Bipolar Disorder, Dissociate Disorder. Take Lithium and four other psychotic drugs (not taken regular). Punching holes is walls, punching the air and karate kicking the air at people, very confrontational. Refusing to leave his room and call his other roommates his children and is identifying as male. Respondent is using alcohol and drugs (meth.)"    Pt denies drug use. Pt's UDS is pending. Pt  reported, being linked to linked to OPT resources (medication management and/or counseling), but didn't specify where.   Pt presents disheveled with word salad speech. Pt's eye contact was poor (pt was eating). Pt's mood was preoccupied. Pt's affect was flat. Pt's thought process was tangential and circumstantial. Pt's concentration, insight and impulse control was poor. Pt was oriented x2.    Total Time spent with patient: 30 minutes  Psychiatric Specialty Exam: Physical Exam  Constitutional: He is oriented to person, place, and time. He appears well-developed.  Eyes: Pupils are equal, round, and reactive to light.  Neck: Normal range of motion.  Cardiovascular: Normal rate.  Respiratory: Effort normal.  Musculoskeletal: Normal range of motion.  Neurological: He is alert and oriented to person, place, and time.  Skin: Skin is warm and dry.  Psychiatric: His speech is normal. His affect is labile and inappropriate. He is slowed and actively hallucinating. Thought content is delusional. Cognition and memory are impaired. He expresses inappropriate judgment.    Review of Systems  Psychiatric/Behavioral: Positive for hallucinations. Negative for substance abuse and suicidal ideas.  All other systems reviewed and are negative.   There were no vitals taken for this visit.There is no height or weight on file to calculate BMI.  General Appearance: Disheveled  Eye Contact:  Fair  Speech:  Blocked  Volume:  Normal  Mood:  Depressed and Irritable  Affect:  Labile and Full Range  Thought Process:  Disorganized and Descriptions of Associations: Loose  Orientation:  Full (Time, Place, and Person)  Thought Content:  Illogical and Delusions  Suicidal Thoughts:  No  Homicidal Thoughts:  No  Memory:  Recent;   Fair  Judgement:  Impaired  Insight:  Lacking  Psychomotor Activity:  Increased  Concentration: Concentration: Poor  Recall:  FiservFair  Fund of Knowledge:Poor  Language: Fair  Akathisia:   NA  Handed:  Right  AIMS (if indicated):     Assets:  Social Support  Sleep:       Musculoskeletal: Strength & Muscle Tone: within normal limits Gait & Station: normal Patient leans: N/A  There were no vitals taken for this visit.  Recommendations:  Based on my evaluation the patient does not appear to have an emergency medical condition.  Based on my evaluation patient meets the criteria for inpatient psychiatric stabilization.    Joshua Leschashaun M Daily Crate, NP 11/03/2018, 9:58 PM

## 2018-11-03 NOTE — H&P (Addendum)
Psychiatric Admission Assessment Adult  Patient Identification: Charlynn GrimesJames B Consiglio MRN:  [id number] Date of Evaluation:  11/03/2018 Chief Complaint:  Pending Principal Diagnosis: <principal problem not specified> Diagnosis:  Active Problems:   Schizoaffective disorder (HCC)  History of Present Illness:   Charlynn GrimesJames B Bowker is an 38 y.o. male that [facility].  Upon approach patient is sitting in a chair eating and making little to no eye contact. Patient appearance is disheveled, mood is euthymic and his affect is labile and full.  The following assessment is per the TTS counselor: Lemar LivingsJames B Boggsis an 38 y.o., who presents involuntary and unaccompanied to [facility]. Pt identifies as male, pt was unclear of preferred pronouns to be used.Clinician asked the pt, "what brought you to the hospital?"Pt reported,came here to talk then going back home." Pt reported, retired from [employer] (did not disclose which branch.) Pt reported, not wanting to discuss event what occurred in the [employer]. Pt reported, seeing and hearing things others can not because they are not in the same room. Clinician asked clarifying questions, pt did not want to discuss topic further. Pt reported, having children Arletha Grippe(Redman and Method man) by [place] [employer]. Pt wanted clinician and NP to find out his kids location. Pt reported, in [place] women go through a certain process and have beards. Pt reported, living with "three mentally handicapped people" and taking care of their daily needs. Pt reported, being the mother of the three handicapped individuals. Pt did not answer many questions during the assessment but discussed the military and handicapped individuals. Pt denies, SI.  Pt was IVC by spouse [facility](Dwayne Olevia PerchesKeith Gill, [phone]). Per IVC paperwork: "Diagnosed with Schizophrenia, Bipolar Disorder, Dissociate Disorder. Take Lithium and four other psychotic drugs (not taken regular). Punching holesis walls, punching  the air and karate kicking the air at people, very confrontational. Refusing to leave his room and call his other roommates his children and is identifying as male. Respondent is using alcohol and drugs (meth.)"  Pt denies drug use. Pt's UDS is pending. Pt reported, being linked tolinked to OPT resources (medication management and/or counseling), but didn't specify where.   Pt presents disheveled with word salad speech. Pt's eye contact was poor (pt was eating). Pt's mood was preoccupied. Pt's affect was flat. Pt's thought process was tangential and circumstantial. Pt's concentration, insight and impulse control was poor. Pt was oriented x2.    Associated Signs/Symptoms: Depression Symptoms:  depressed mood, difficulty concentrating, hopelessness, impaired memory, recurrent thoughts of death, anxiety, (Hypo) Manic Symptoms:  Distractibility, Elevated Mood, Flight of Ideas, Irritable Mood, Labiality of Mood, Anxiety Symptoms:  Excessive Worry, Psychotic Symptoms:  Delusions, PTSD Symptoms: NA Total Time spent with patient: 30 minutes  Past Psychiatric History: yes  Is the patient at risk to self? No.  Has the patient been a risk to self in the past 6 months? Yes.    Has the patient been a risk to self within the distant past? Yes.    Is the patient a risk to others? No.  Has the patient been a risk to others in the past 6 months? No.  Has the patient been a risk to others within the distant past? No.   Prior Inpatient Therapy: Prior Inpatient Therapy: (UTA) Prior Outpatient Therapy: Prior Outpatient Therapy: (UTA)  Alcohol Screening: 1. How often do you have a drink containing alcohol?: 4 or more times a week 2. How many drinks containing alcohol do you have on a typical day when  you are drinking?: 7, 8, or 9 3. How often do you have six or more drinks on one occasion?: Weekly AUDIT-C Score: 10 Substance Abuse History in the last 12 months:  No. Consequences of  Substance Abuse: NA Previous Psychotropic Medications: Yes  Psychological Evaluations: Yes  Past Medical History:  Past Medical History:  Diagnosis Date  . Depression   . Gender identity disorder   . Liver disorder   . PTSD (post-traumatic stress disorder)   . Schizoaffective disorder (HCC)    History reviewed. No pertinent surgical history. Family History:  Family History  Problem Relation Age of Onset  . Multiple sclerosis Mother   . Mental illness Neg Hx    Family Psychiatric  History: yes Tobacco Screening:   Social History:  Social History   Substance and Sexual Activity  Alcohol Use Yes   Comment: occasional     Social History   Substance and Sexual Activity  Drug Use Yes  . Types: Cocaine, Methamphetamines, Marijuana   Comment: last useof methamphetamines and marijuana yesterday    Additional Social History: Marital status: Married    Pain Medications: See MAR Prescriptions: See MAR Over the Counter: See MAR History of alcohol / drug use?: Yes Name of Substance 1: Alcohol (Per IVC, however denies use) 1 - Age of First Use: UTA 1 - Amount (size/oz): UTA 1 - Frequency: UTA 1 - Duration: UTA 1 - Last Use / Amount: UTA Name of Substance 2: Meth (Per IVC, however denies use) 2 - Age of First Use: UTA 2 - Amount (size/oz): UTA 2 - Frequency: UTA 2 - Duration: UTA 2 - Last Use / Amount: UTA                Allergies:   Allergies  Allergen Reactions  . Penicillins Swelling, Rash and Other (See Comments)    Reaction:  Facial swelling Has patient had a PCN reaction causing immediate rash, facial/tongue/throat swelling, SOB or lightheadedness with hypotension: Yes Has patient had a PCN reaction causing severe rash involving mucus membranes or skin necrosis: No Has patient had a PCN reaction that required hospitalization No Has patient had a PCN reaction occurring within the last 10 years: No If all of the above answers are "NO", then may proceed with  Cephalosporin use.  . Sulfa Antibiotics Rash   Lab Results: No results found for this or any previous visit (from the past 48 hour(s)).  Blood Alcohol level:  Lab Results  Component Value Date   ETH <10 09/10/2018   ETH <10 05/29/2018    Metabolic Disorder Labs:  Lab Results  Component Value Date   HGBA1C 5.1 09/17/2015   MPG 100 09/17/2015   No results found for: PROLACTIN Lab Results  Component Value Date   CHOL 191 09/17/2015   TRIG 196 (H) 09/17/2015   HDL 27 (L) 09/17/2015   CHOLHDL 7.1 09/17/2015   VLDL 39 09/17/2015   LDLCALC 125 (H) 09/17/2015    Current Medications: Current Facility-Administered Medications  Medication Dose Route Frequency Provider Last Rate Last Dose  . acetaminophen (TYLENOL) tablet 650 mg  650 mg Oral Q6H PRN Dixon, Rashaun M, NP      . alum & mag hydroxide-simeth (MAALOX/MYLANTA) 200-200-20 MG/5ML suspension 30 mL  30 mL Oral Q4H PRN Dixon, Rashaun M, NP      . hydrOXYzine (ATARAX/VISTARIL) tablet 50 mg  50 mg Oral TID PRN Jearld Leschixon, Rashaun M, NP      . magnesium hydroxide (MILK OF MAGNESIA)  suspension 30 mL  30 mL Oral Daily PRN Dixon, Rashaun M, NP      . traZODone (DESYREL) tablet 50 mg  50 mg Oral QHS PRN Deloria Lair, NP       PTA Medications: Medications Prior to Admission  Medication Sig Dispense Refill Last Dose  . atorvastatin (LIPITOR) 20 MG tablet Take 1 tablet (20 mg total) by mouth daily. For high Cholesterol (Patient not taking: Reported on 10/08/2017) 1 tablet 0 Unknown at Unknown time  . emtricitabine-tenofovir (TRUVADA) 200-300 MG tablet Take 1 tablet by mouth daily. For pre-exposure to HIV (Patient not taking: Reported on 10/08/2017)   Unknown at Unknown time    Musculoskeletal: Strength & Muscle Tone: within normal limits Gait & Station: normal Patient leans: N/A  Psychiatric Specialty Exam: Physical Exam  Constitutional: He is oriented to person, place, and time. He appears well-developed.  HENT:  Head:  Normocephalic.  Eyes: Pupils are equal, round, and reactive to light.  Neck: Normal range of motion.  Cardiovascular: Normal rate.  Respiratory: Effort normal.  Musculoskeletal: Normal range of motion.  Neurological: He is alert and oriented to person, place, and time.  Skin: Skin is warm and dry.  Psychiatric: His mood appears anxious. His affect is labile and inappropriate. His speech is rapid and/or pressured and delayed. He is agitated and actively hallucinating. Thought content is paranoid and delusional. Cognition and memory are impaired. He expresses impulsivity and inappropriate judgment. He exhibits a depressed mood.    Review of Systems  Psychiatric/Behavioral: Positive for hallucinations. Negative for substance abuse and suicidal ideas.  All other systems reviewed and are negative.   Blood pressure 109/62, pulse 70.There is no height or weight on file to calculate BMI.  General Appearance: Disheveled  Eye Contact:  Minimal  Speech:  Pressured  Volume:  Normal  Mood:  Anxious, Depressed and Irritable  Affect:  Labile and Full Range  Thought Process:  Disorganized and Descriptions of Associations: Loose  Orientation:  Full (Time, Place, and Person)  Thought Content:  Illogical, Delusions and Ideas of Reference:   Delusions  Suicidal Thoughts:  No  Homicidal Thoughts:  No  Memory:  Recent;   Fair  Judgement:  Impaired  Insight:  Lacking  Psychomotor Activity:  Increased  Concentration:  Concentration: Poor  Recall:  [place] of Knowledge:  Poor  Language:  Fair  Akathisia:  NA  Handed:  Right  AIMS (if indicated):     Assets:  Social Support  ADL's:  Intact  Cognition:  Impaired,  Mild  Sleep:       Treatment Plan Summary: Daily contact with patient to assess and evaluate symptoms and progress in treatment, Medication management and Plan and therapeutic support   Observation Level/Precautions:  15 minute checks  Laboratory:  CBC Chemistry  Profile HbAIC HCG UDS UA Vitamin B-12  Psychotherapy:    Medications:    Consultations:    Discharge Concerns:    Estimated LOS:  Other:     Physician Treatment Plan for Primary Diagnosis: <principal problem not specified> Long Term Goal(s): Improvement in symptoms so as ready for discharge  Short Term Goals: Ability to identify changes in lifestyle to reduce recurrence of condition will improve, Ability to verbalize feelings will improve, Ability to demonstrate self-control will improve, Ability to maintain clinical measurements within normal limits will improve and Compliance with prescribed medications will improve  Physician Treatment Plan for Secondary Diagnosis: Active Problems:   Schizoaffective disorder ([place])  Long Term  Goal(s): Improvement in symptoms so as ready for discharge  Short Term Goals: Ability to identify changes in lifestyle to reduce recurrence of condition will improve, Ability to verbalize feelings will improve, Ability to maintain clinical measurements within normal limits will improve and Compliance with prescribed medications will improve  I certify that inpatient services furnished can reasonably be expected to improve the patient's condition.    Jearld Leschashaun M Dixon, NP 8/13/202011:14 PM   Patient's chart reviewed. Reviewed the information documented and agree with the treatment plan.  Juanetta BeetsJacqueline Jaqwan Wieber, DO 11/04/18 2:27 PM

## 2018-11-03 NOTE — B[facility] Counselor (Signed)
Clinician called pt's spouse [facility] Elder Love, [phone]), to gather additional information. Clinician left a HIPPA compliant voice message.    Vertell Novak, [place], [facility], [facility] Triage Specialist [phone]

## 2018-11-03 NOTE — Progress Notes (Signed)
Patient ID: Joshua Davila, male   DOB: 1981/02/17, 38 y.o.   MRN: [id number] Pt presents under IVC, identifies as a male/[name], presents with aggressive,, confrontational behavior at home, punching walls.  Seeing & hearing people who aren't there.  Pt not taking meds on a regular basis.  Awake, alert & responsive, no distress noted,  Skin search completed, pt changed into scrubs.  Monitoring for safety.

## 2018-11-03 NOTE — BH Assessment (Addendum)
Assessment Note  Joshua Davila is an 38 y.o., who presents involuntary and unaccompanied to [facility]. Pt identifies as male, pt was unclear of preferred pronouns to be used. Clinician asked the pt, "what brought you to the hospital?" Pt reported, came here to talk then going back home." Pt reported, retired from [employer] (did not disclose which branch.) Pt reported, not wanting to discuss event what occurred in the [employer]. Pt reported, seeing and hearing things others can not because they are not in the same room. Clinician asked clarifying questions, pt did not want to discuss topic further. Pt reported, having children Arletha Grippe([employer]man and Method man) by [employer] [place]. Pt wanted clinician and NP to find out his kids location. Pt reported, in [place] women go through a certain process and have beards. Pt reported, living with "three mentally handicapped people" and taking care of their daily needs. Pt reported, being the mother of the three handicapped individuals. Pt did not answer many questions during the assessment but discussed the military and handicapped individuals. Pt denies, SI.   Pt was IVC by spouse [facility](Dwayne Olevia PerchesKeith Gill, [phone]). Per IVC paperwork: "Diagnosed with Schizophrenia, Bipolar Disorder, Dissociate Disorder. Take Lithium and four other psychotic drugs (not taken regular). Punching holes is walls, punching the air and karate kicking the air at people, very confrontational. Refusing to leave his room and call his other roommates his children and is identifying as male. Respondent is using alcohol and drugs (meth.)"    Pt denies drug use. Pt's UDS is pending. Pt reported, being linked to linked to OPT resources (medication management and/or counseling), but didn't specify where.   Pt presents disheveled with word salad speech. Pt's eye contact was poor (pt was eating). Pt's mood was preoccupied. Pt's affect was flat. Pt's thought process was tangential and circumstantial. Pt's  concentration, insight and impulse control was poor. Pt was oriented x2.   Diagnosis: Schizoaffective Disorder, Bipolar Type (HCC)                     PTSD (Per chart.)                     Polysubstance use.   Past Medical History:  Past Medical History:  Diagnosis Date  . Depression   . Gender identity disorder   . Liver disorder   . PTSD (post-traumatic stress disorder)   . Schizoaffective disorder (HCC)     History reviewed. No pertinent surgical history.  Family History:  Family History  Problem Relation Age of Onset  . Multiple sclerosis Mother   . Mental illness Neg Hx     Social History:  reports that he has been smoking cigarettes. He has been smoking about 1.00 pack per day. He has never used smokeless tobacco. He reports current alcohol use. He reports current drug use. Drugs: Cocaine, Methamphetamines, and Marijuana.  Additional Social History:  Alcohol / Drug Use Pain Medications: See MAR Prescriptions: See MAR Over the Counter: See MAR History of alcohol / drug use?: Yes Substance #1 Name of Substance 1: Alcohol (Per IVC, however denies use) 1 - Age of First Use: UTA 1 - Amount (size/oz): UTA 1 - Frequency: UTA 1 - Duration: UTA 1 - Last Use / Amount: UTA Substance #2 Name of Substance 2: Meth (Per IVC, however denies use) 2 - Age of First Use: UTA 2 - Amount (size/oz): UTA 2 - Frequency: UTA 2 - Duration: UTA 2 - Last Use /  Amount: UTA  CIWA: CIWA-Ar BP: 109/62 Pulse Rate: 70 Nausea and Vomiting: no nausea and no vomiting Tactile Disturbances: none Tremor: no tremor Auditory Disturbances: moderately severe hallucinations Paroxysmal Sweats: no sweat visible Visual Disturbances: moderately severe hallucinations Anxiety: moderately anxious, or guarded, so anxiety is inferred Headache, Fullness in Head: none present Agitation: somewhat more than normal activity Orientation and Clouding of Sensorium: oriented and can do serial additions CIWA-Ar  Total: 13 COWS: Clinical Opiate Withdrawal Scale (COWS) Resting Pulse Rate: Pulse Rate 80 or below Sweating: No report of chills or flushing Restlessness: Reports difficulty sitting still, but is able to do so Pupil Size: Pupils pinned or normal size for room light Bone or Joint Aches: Not present Runny Nose or Tearing: Not present GI Upset: No GI symptoms Tremor: No tremor Yawning: No yawning Anxiety or Irritability: Patient obviously irritable/anxious Gooseflesh Skin: Skin is smooth COWS Total Score: 3  Allergies:  Allergies  Allergen Reactions  . Penicillins Swelling, Rash and Other (See Comments)    Reaction:  Facial swelling Has patient had a PCN reaction causing immediate rash, facial/tongue/throat swelling, SOB or lightheadedness with hypotension: Yes Has patient had a PCN reaction causing severe rash involving mucus membranes or skin necrosis: No Has patient had a PCN reaction that required hospitalization No Has patient had a PCN reaction occurring within the last 10 years: No If all of the above answers are "NO", then may proceed with Cephalosporin use.  . Sulfa Antibiotics Rash    Home Medications:  Medications Prior to Admission  Medication Sig Dispense Refill  . atorvastatin (LIPITOR) 20 MG tablet Take 1 tablet (20 mg total) by mouth daily. For high Cholesterol (Patient not taking: Reported on 10/08/2017) 1 tablet 0  . emtricitabine-tenofovir (TRUVADA) 200-300 MG tablet Take 1 tablet by mouth daily. For pre-exposure to HIV (Patient not taking: Reported on 10/08/2017)      OB/GYN Status:  No LMP for male patient.  General Assessment Data [facility] TTS Assessment: In system Is this a Tele or Face-to-Face Assessment?: Face-to-Face Is this an Initial Assessment or a Re-assessment for this encounter?: Initial Assessment Patient Accompanied by:: N/A Language Other than English: No Living Arrangements: Other (Comment)(with spouse  and three others. ) What gender do you identify as?: Male(Pt identifies as male. ) Marital status: Married Living Arrangements: Spouse/significant other, Non-relatives/Friends Can pt return to current living arrangement?: (UTA) Admission Status: Involuntary Petitioner: Family member(By spouse. ) Is patient capable of signing voluntary admission?: No Referral Source: Self/Family/Friend Insurance type: Medicare.   Medical Screening Exam [facility] Walk-in ONLY) Medical Exam completed: Yes  Crisis Care Plan Living Arrangements: Spouse/significant other, Non-relatives/Friends Legal Guardian: Other:(Per pt, self. ) Name of Psychiatrist: UTA Name of Therapist: UTA  Education Status Is patient currently in school?: No Is the patient employed, unemployed or receiving disability?: (UTA)  Risk to self with the past 6 months Suicidal Ideation: No(Pt denies. ) Has patient been a risk to self within the past 6 months prior to admission? : (UTA) Suicidal Intent: No Has patient had any suicidal intent within the past 6 months prior to admission? : No Is patient at risk for suicide?: No Suicidal Plan?: No(Pt denies. ) Has patient had any suicidal plan within the past 6 months prior to admission? : (UTA) Access to Means: (UTA) What has been your use of drugs/alcohol within the last 12 months?: Pending. Previous Attempts/Gestures: (UTA) How many times?: (UTA) Other Self Harm Risks: Per IVC, punching holes is walls,  punching the air and karate kicking the air at people.  Triggers for Past Attempts: (UTA) Intentional Self Injurious Behavior: Damaging Comment - Self Injurious Behavior: Per IVC punching holes is walls, punching the air and karate kicking the air at people Family Suicide History: Unable to assess Recent stressful life event(s): (UTA) Persecutory voices/beliefs?: Pincus Badder) Depression: (UTA) Depression Symptoms: (UTA) Substance abuse history and/or treatment for substance abuse?:  (UTA) Suicide prevention information given to non-admitted patients: Not applicable  Risk to Others within the past 6 months Homicidal Ideation: (UTA) Does patient have any lifetime risk of violence toward others beyond the six months prior to admission? : Yes (comment)(Per IVC punching hole is walls, punching the air and karate,) Thoughts of Harm to Others: (UTA) Current Homicidal Intent: (UTA) Current Homicidal Plan: (UTA) Access to Homicidal Means: (UTA) Identified Victim: UTA History of harm to others?: Yes Assessment of Violence: On admission Violent Behavior Description: Per IVC punching hole is walls, punching the air and karate kicking the air at people, very confrontational.  Does patient have access to weapons?: (UTA) Criminal Charges Pending?: (UTA) Does patient have a court date: (UTA) Is patient on probation?: (UTA)  Psychosis Hallucinations: Auditory, Visual Delusions: Unspecified  Mental Status Report Appearance/Hygiene: Disheveled Eye Contact: Poor Motor Activity: Unremarkable Speech: Word salad Level of Consciousness: Alert Mood: Preoccupied Affect: Flat Anxiety Level: None Thought Processes: Tangential, Circumstantial Judgement: Impaired Orientation: Person, Place Obsessive Compulsive Thoughts/Behaviors: None  Cognitive Functioning Concentration: Poor Memory: Recent Impaired Is patient IDD: No Insight: Poor Impulse Control: Poor Appetite: (UTA) Sleep: Unable to Assess Total Hours of Sleep: (UTA) Vegetative Symptoms: Unable to Assess  ADLScreening [facility] Assessment Services) Patient's cognitive ability adequate to safely complete daily activities?: Yes Patient able to express need for assistance with ADLs?: Yes Independently performs ADLs?: Yes (appropriate for developmental age)  Prior Inpatient Therapy Prior Inpatient Therapy: (UTA)  Prior Outpatient Therapy Prior Outpatient Therapy: (UTA)  ADL Screening (condition at time of  admission) Patient's cognitive ability adequate to safely complete daily activities?: Yes Is the patient deaf or have difficulty hearing?: No Does the patient have difficulty seeing, even when wearing glasses/contacts?: No Does the patient have difficulty concentrating, remembering, or making decisions?: Yes Patient able to express need for assistance with ADLs?: Yes Does the patient have difficulty dressing or bathing?: No Independently performs ADLs?: Yes (appropriate for developmental age) Does the patient have difficulty walking or climbing stairs?: No Weakness of Legs: (UTA) Weakness of Arms/Hands: (UTA)  Home Assistive Devices/Equipment Home Assistive Devices/Equipment: (UTA)    Abuse/Neglect Assessment (Assessment to be complete while patient is alone) Abuse/Neglect Assessment Can Be Completed: Unable to assess, patient is non-responsive or altered mental status     Advance Directives (For Healthcare) Does Patient Have a Medical Advance Directive?: Unable to assess, patient is non-responsive or altered mental status          Disposition: Anette Riedel, NP recommends OBS Unit: 202-1. Discussed with Darryll Capers, RN.    Disposition Initial Assessment Completed for this Encounter: Yes Disposition of Patient: Admit  On Site Evaluation by: Vertell Novak, MS, [facility], [place]. Reviewed with Physician: Anette Riedel, NP.  Vertell Novak 11/03/2018 10:16 PM    Vertell Novak, [place], [facility], [facility] Triage Specialist [phone]

## 2018-11-04 DIAGNOSIS — F250 Schizoaffective disorder, bipolar type: Secondary | ICD-10-CM

## 2018-11-04 LAB — SARS CORONAVIRUS 2 BY RT PCR (HOSPITAL ORDER, PERFORMED IN ~~LOC~~ HOSPITAL LAB): SARS Coronavirus 2: NEGATIVE

## 2018-11-04 NOTE — Progress Notes (Signed)
CSW spoke with pt's spouse and updated him on disposition. He voiced concern that pt was not being treated for his [place] concerns but voiced understanding that pt was psychiatrically cleared and stated he would arrive at [facility] to pick pt up within 15 min. Pt's spouse was also given information on ACTT services at his request.   Rhae Hammock Disposition CSW [facility]/TTS [phone]

## 2018-11-04 NOTE — Discharge Summary (Addendum)
Physician Discharge Summary Note  Patient:  Joshua Davila is an 38 y.o., male MRN:  [id number] DOB:  06-Jun-1980 Patient phone:  [phone] (home)  Patient address:   [address] [place] [address],  Total Time spent with patient: 30 minutes  Date of Admission:  11/03/2018 Date of Discharge: 11/04/2018  Reason for Admission:   Pt presented to [facility], as a walk-in, under IVC. Pt is well known to this hospital system and has a long history of substance abuse and aggressive behavior when he is using substances. Pt denies suicidal and homicidal ideation. He lives with his husband who is also his guardiian. Pt appears disorganized today and is not sure why he is at the hospital. He denies drug use. He also will not let the staff take his vital signs and is rude when anyone approaches him. Pt will be referred to [facility] for his substance abuse problems. Pt's IVC will be rescinded and he is psychiatrically clear for discharge. Pt's husband will pick him up.    Principal Problem: Schizoaffective disorder, bipolar type [facility]) Discharge Diagnoses: Principal Problem:   Schizoaffective disorder, bipolar type ([place]) Active Problems:   Substance induced mood disorder ([place])   Past Psychiatric History: As above  Past Medical History:  Past Medical History:  Diagnosis Date  . Depression   . Gender identity disorder   . Liver disorder   . PTSD (post-traumatic stress disorder)   . Schizoaffective disorder ([place])    History reviewed. No pertinent surgical history. Family History:  Family History  Problem Relation Age of Onset  . Multiple sclerosis Mother   . Mental illness Neg Hx    Family Psychiatric  History: None per chart review. Social History:  Social History   Substance and Sexual Activity  Alcohol Use Yes   Comment: occasional     Social History   Substance and Sexual Activity  Drug Use Yes  . Types: Cocaine, Methamphetamines, Marijuana   Comment: last useof  methamphetamines and marijuana yesterday    Social History   Socioeconomic History  . Marital status: Single    Spouse name: Not on file  . Number of children: Not on file  . Years of education: Not on file  . Highest education level: Not on file  Occupational History  . Not on file  Social Needs  . Financial resource strain: Not on file  . Food insecurity    Worry: Not on file    Inability: Not on file  . Transportation needs    Medical: Not on file    Non-medical: Not on file  Tobacco Use  . Smoking status: Current Every Day Smoker    Packs/day: 1.00    Types: Cigarettes  . Smokeless tobacco: Never Used  Substance and Sexual Activity  . Alcohol use: Yes    Comment: occasional  . Drug use: Yes    Types: Cocaine, Methamphetamines, Marijuana    Comment: last useof methamphetamines and marijuana yesterday  . Sexual activity: Not Currently  Lifestyle  . Physical activity    Days per week: Not on file    Minutes per session: Not on file  . Stress: Not on file  Relationships  . Social Herbalist on phone: Not on file    Gets together: Not on file    Attends religious service: Not on file    Active member of club or organization: Not on file    Attends meetings of clubs or organizations:  Not on file    Relationship status: Not on file  Other Topics Concern  . Not on file  Social History Narrative  . Not on file     Physical Findings: AIMS: Facial and Oral Movements Muscles of Facial Expression: None, normal Lips and Perioral Area: None, normal Jaw: None, normal Tongue: None, normal,Extremity Movements Upper (arms, wrists, hands, fingers): None, normal Lower (legs, knees, ankles, toes): None, normal, Trunk Movements Neck, shoulders, hips: None, normal, Overall Severity Severity of abnormal movements (highest score from questions above): None, normal Incapacitation due to abnormal movements: None, normal Patient's awareness of abnormal movements (rate  only patient's report): No Awareness, Dental Status Current problems with teeth and/or dentures?: No Does patient usually wear dentures?: No  CIWA:  CIWA-Ar Total: 13 COWS:  COWS Total Score: 3  Musculoskeletal: Strength & Muscle Tone: within normal limits Gait & Station: normal Patient leans: N/A  Psychiatric Specialty Exam:   Blood pressure 109/62, pulse 70.There is no height or weight on file to calculate BMI.  General Appearance: Disheveled  Eye SolicitorContact::  Fair  Speech:  Garbled and Slow  Volume:  Decreased  Mood:  Anxious and Irritable  Affect:  Congruent  Thought Process:  Coherent and Descriptions of Associations: Loose  Orientation:  Full (Time, Place, and Person)  Thought Content:  Logical  Suicidal Thoughts:  No  Homicidal Thoughts:  No  Memory:  Immediate;   Fair Recent;   Fair Remote;   Fair  Judgement:  Poor  Insight:  Shallow  Psychomotor Activity:  Normal  Concentration:  Fair  Recall:  Fair  Fund of Knowledge:Good  Language: Good  Akathisia:  Negative  Handed:  Right  AIMS (if indicated):   N/A  Assets:  ArchitectCommunication Skills Financial Resources/Insurance Housing Social Support  Sleep:   N/A  Cognition: WNL  ADL's:  Intact      Has this patient used any form of tobacco in the last 30 days? (Cigarettes, Smokeless Tobacco, Cigars, and/or Pipes) Yes, Prescription not provided because: Pt declined  Blood Alcohol level:  Lab Results  Component Value Date   ETH <10 09/10/2018   ETH <10 05/29/2018    Metabolic Disorder Labs:  Lab Results  Component Value Date   HGBA1C 5.1 09/17/2015   MPG 100 09/17/2015   No results found for: PROLACTIN Lab Results  Component Value Date   CHOL 191 09/17/2015   TRIG 196 (H) 09/17/2015   HDL 27 (L) 09/17/2015   CHOLHDL 7.1 09/17/2015   VLDL 39 09/17/2015   LDLCALC 125 (H) 09/17/2015    See Psychiatric Specialty Exam and Suicide Risk Assessment completed by Attending Physician prior to  discharge.  Discharge destination:  Home  Is patient on multiple antipsychotic therapies at discharge:  No   Has Patient had three or more failed trials of antipsychotic monotherapy by history:  No  Recommended Plan for Multiple Antipsychotic Therapies: NA   Allergies as of 11/04/2018      Reactions   Penicillins Swelling, Rash, Other (See Comments)   Reaction:  Facial swelling Has patient had a PCN reaction causing immediate rash, facial/tongue/throat swelling, SOB or lightheadedness with hypotension: Yes Has patient had a PCN reaction causing severe rash involving mucus membranes or skin necrosis: No Has patient had a PCN reaction that required hospitalization No Has patient had a PCN reaction occurring within the last 10 years: No If all of the above answers are "NO", then may proceed with Cephalosporin use.   Sulfa Antibiotics Rash  Medication List    You have not been prescribed any medications.      Follow-up recommendations:  Activity:  as tolerated  Diet:  Heart healthy  Comments and Treatment Plan: Schizoaffective disorder, bipolar type (HCC) Take all medications as prescribed. Keep all follow-up appointments as scheduled.  Do not consume alcohol or use illegal drugs while on prescription medications. Report any adverse effects from your medications to your primary care provider promptly.  In the event of recurrent symptoms or worsening symptoms, call 911, a crisis hotline, or go to the nearest emergency department for evaluation.   Signed: Laveda AbbeLaurie Britton Parks, NP 11/04/2018, 12:43 PM   Patient seen face-to-face for psychiatric evaluation, chart reviewed and case discussed with the physician extender and developed treatment plan. Reviewed the information documented and agree with the treatment plan.  Juanetta BeetsJacqueline Isabelle Matt, DO 11/04/18 2:49 PM

## 2018-11-04 NOTE — Progress Notes (Signed)
Patient ID: Joshua Davila, male   DOB: [birth year], 38 y.o.   MRN: [id number]  D: Pt alert and oriented on the unit.   A: Education, support, and encouragement provided. Discharge summary, medications and follow up appointments reviewed with pt. Suicide prevention resources provided, including "My 3 App." Pt's belongings in locker #14 returned and belongings sheet signed.  R: Pt denies SI/HI, A/VH, or any concerns at this time. Pt ambulatory on and off unit. Pt discharged to lobby.

## 2018-11-04 NOTE — Progress Notes (Addendum)
Patient ID: Joshua Davila, male   DOB: Jun 03, 1980, 37 y.o.   MRN: [id number]  Pt refused vital signs and labs this morning. Pt's affect is labile on approach. Pt yelling about "People coming in my room and sitting on my bed that I cannot see!" Pt stated that he does not need to be here and wants to see the doctor. Reassurance provided, Q15 minute safety checks remain in effect. Pt is safe on the unit. Will continue to monitor.

## 2018-11-04 NOTE — [facility] Suicide Risk Assessment (Cosign Needed)
Suicide Risk Assessment  Discharge Assessment   [facility] Discharge Suicide Risk Assessment   Principal Problem: Schizoaffective disorder, bipolar type [facility]) Discharge Diagnoses: Principal Problem:   Schizoaffective disorder, bipolar type ([place]) Active Problems:   Substance induced mood disorder ([place])   Total Time spent with patient: 30 minutes  Musculoskeletal: Strength & Muscle Tone: within normal limits Gait & Station: normal Patient leans: N/A  Psychiatric Specialty Exam:   Blood pressure 109/62, pulse 70.There is no height or weight on file to calculate BMI.  General Appearance: Disheveled  Eye Contact::  Fair  Speech:  Garbled and [id number]  Volume:  Decreased  Mood:  Anxious and Irritable  Affect:  Congruent  Thought Process:  Coherent and Descriptions of Associations: Loose  Orientation:  Full (Time, Place, and Person)  Thought Content:  Logical  Suicidal Thoughts:  No  Homicidal Thoughts:  No  Memory:  Immediate;   Fair Recent;   Fair Remote;   Fair  Judgement:  Poor  Insight:  Shallow  Psychomotor Activity:  Normal  Concentration:  Fair  Recall:  [place] of Knowledge:Good  Language: Good  Akathisia:  Negative  Handed:  Right  AIMS (if indicated):     Assets:  Agricultural consultant Housing Social Support  Sleep:     Cognition: WNL  ADL's:  Intact   Mental Status Per Nursing Assessment::   On Admission:  Pt presented, under IVC, placed by his husband and guardian. Pt is well known to this hospital system and has a long history of substance abuse and aggressive behavior when he is using substances. Pt denies suicidal and homicidal ideation. Pt appears disorganized today and is not sure why he is at the hospital. He denies drug use. He also will not let the staff take his vital signs and is rude when anyone approaches him. Pt will be referred to [facility] for his substance abuse problems. Pt's IVC will be rescinded and he is  psychiatrically clear for discharge.   Demographic Factors:  Male, Caucasian, Low socioeconomic status and Unemployed  Loss Factors: Legal issues and Financial problems/change in socioeconomic status  Historical Factors: Family history of mental illness or substance abuse  Risk Reduction Factors:   Sense of responsibility to family and Living with another person, especially a relative  Continued Clinical Symptoms:  Bipolar Disorder:   Mixed State Alcohol/Substance Abuse/Dependencies  Cognitive Features That Contribute To Risk:  Closed-mindedness    Suicide Risk:  Minimal: No identifiable suicidal ideation.  Patients presenting with no risk factors but with morbid ruminations; may be classified as minimal risk based on the severity of the depressive symptoms    Plan Of Care/Follow-up recommendations:  Activity:  as tolerated Diet:  heart healthy  Ethelene Hal, NP 11/04/2018, 12:21 PM

## 2018-12-01 ENCOUNTER — Emergency Department (HOSPITAL_COMMUNITY): Payer: Medicare Other

## 2018-12-01 ENCOUNTER — Emergency Department (HOSPITAL_COMMUNITY)
Admission: EM | Admit: 2018-12-01 | Discharge: 2018-12-08 | Disposition: A | Discharge status: Home / Self Care | Payer: Medicare Other | Attending: Psychiatry | Admitting: Psychiatry

## 2018-12-01 DIAGNOSIS — Z20828 Contact with and (suspected) exposure to other viral communicable diseases: Secondary | ICD-10-CM | POA: Diagnosis not present

## 2018-12-01 DIAGNOSIS — R443 Hallucinations, unspecified: Secondary | ICD-10-CM

## 2018-12-01 DIAGNOSIS — R441 Visual hallucinations: Secondary | ICD-10-CM | POA: Insufficient documentation

## 2018-12-01 DIAGNOSIS — F23 Brief psychotic disorder: Secondary | ICD-10-CM | POA: Diagnosis present

## 2018-12-01 DIAGNOSIS — F15921 Other stimulant use, unspecified with intoxication delirium: Secondary | ICD-10-CM | POA: Insufficient documentation

## 2018-12-01 DIAGNOSIS — F329 Major depressive disorder, single episode, unspecified: Secondary | ICD-10-CM | POA: Diagnosis not present

## 2018-12-01 DIAGNOSIS — R4182 Altered mental status, unspecified: Secondary | ICD-10-CM | POA: Diagnosis present

## 2018-12-01 LAB — COMPREHENSIVE METABOLIC PANEL
ALT: 51 U/L — ABNORMAL HIGH (ref 0–44)
AST: 44 U/L — ABNORMAL HIGH (ref 15–41)
Albumin: 3.9 g/dL (ref 3.5–5.0)
Alkaline Phosphatase: 59 U/L (ref 38–126)
Anion gap: 11 (ref 5–15)
BUN: 14 mg/dL (ref 6–20)
CO2: 23 mmol/L (ref 22–32)
Calcium: 8.8 mg/dL — ABNORMAL LOW (ref 8.9–10.3)
Chloride: 105 mmol/L (ref 98–111)
Creatinine, Ser: 1.24 mg/dL (ref 0.61–1.24)
GFR calc Af Amer: >60 mL/min (ref >60)
GFR calc non Af Amer: >60 mL/min (ref >60)
Glucose, Bld: 101 mg/dL — ABNORMAL HIGH (ref 70–99)
Potassium: 3.7 mmol/L (ref 3.5–5.1)
Sodium: 139 mmol/L (ref 135–145)
Total Bilirubin: 0.4 mg/dL (ref 0.3–1.2)
Total Protein: 6.5 g/dL (ref 6.5–8.1)

## 2018-12-01 LAB — RAPID URINE DRUG SCREEN, HOSP PERFORMED
Amphetamines: POSITIVE — AB
Barbiturates: NOT DETECTED
Benzodiazepines: NOT DETECTED
Cocaine: NOT DETECTED
Opiates: NOT DETECTED
Tetrahydrocannabinol: POSITIVE — AB

## 2018-12-01 LAB — CBC
HCT: 37.7 % — ABNORMAL LOW (ref 39.0–52.0)
Hemoglobin: 12.6 g/dL — ABNORMAL LOW (ref 13.0–17.0)
MCH: 29.7 pg (ref 26.0–34.0)
MCHC: 33.4 g/dL (ref 30.0–36.0)
MCV: 88.9 fL (ref 80.0–100.0)
Platelets: 213 10*3/uL (ref 150–400)
RBC: 4.24 MIL/uL (ref 4.22–5.81)
RDW: 13.2 % (ref 11.5–15.5)
WBC: 5.5 10*3/uL (ref 4.0–10.5)
nRBC: 0 % (ref 0.0–0.2)

## 2018-12-01 LAB — ACETAMINOPHEN LEVEL: Acetaminophen (Tylenol), Serum: <10 ug/mL — ABNORMAL LOW (ref 10–30)

## 2018-12-01 LAB — SALICYLATE LEVEL: Salicylate Lvl: <7 mg/dL (ref 2.8–30.0)

## 2018-12-01 LAB — ETHANOL: Alcohol, Ethyl (B): <10 mg/dL (ref <10)

## 2018-12-01 MED ORDER — ACETAMINOPHEN 325 MG PO TABS
650.0000 mg | ORAL_TABLET | Freq: Four times a day (QID) | ORAL | Status: DC | PRN
  Administered 2018-12-03 – 2018-12-08 (×5): 650 mg via ORAL
  Filled 2018-12-01 (×5): qty 2

## 2018-12-01 MED ORDER — LORAZEPAM 1 MG PO TABS
1.0000 mg | ORAL_TABLET | ORAL | Status: DC | PRN
  Administered 2018-12-01: 1 mg via ORAL
  Filled 2018-12-01: qty 1

## 2018-12-01 NOTE — ED Provider Notes (Signed)
[facility] EMERGENCY DEPARTMENT Provider Note   CSN: [id number] Arrival date & time: 12/01/18  1437     History   Chief Complaint Chief Complaint  Patient presents with  . Hallucinations    HPI Joshua Davila is a 38 y.o. male here with visual hallucinations.  Patient states that he sees bugs crawling all over him.  States that they are coming out of his skin.  He told me that he is not taking any drugs but there is amphetamine and marijuana in his system.  He is not having any auditory hallucinations.  Patient unable to tell me what medical problem he has .  He apparently was aggressive with staff and [employer] was called.  Unable to find any medical history in his chart.  He refused to him if he had previous psych admissions or not.  Patient states that he is anxious and depressed but denies any suicidal ideations.     The history is provided by the patient.    No past medical history on file.  There are no active problems to display for this patient.      Home Medications    Prior to Admission medications   Not on File    Family History No family history on file.  Social History Social History   Tobacco Use  . Smoking status: Not on file  Substance Use Topics  . Alcohol use: Not on file  . Drug use: Not on file     Allergies   Patient has no allergy information on record.   Review of Systems Review of Systems  Psychiatric/Behavioral: Positive for agitation, confusion and hallucinations.  All other systems reviewed and are negative.    Physical Exam Updated Vital Signs BP 129/81 (BP Location: Left Arm)   Pulse 75   Temp 98.4 F (36.9 C) (Oral)   Resp 18   SpO2 100%   Physical Exam Vitals signs and nursing note reviewed.  HENT:     Head: Normocephalic.     Nose: Nose normal.     Mouth/Throat:     Mouth: Mucous membranes are moist.  Eyes:     Extraocular Movements: Extraocular movements intact.     Pupils: Pupils are equal,  round, and reactive to light.  Neck:     Musculoskeletal: Normal range of motion.  Cardiovascular:     Rate and Rhythm: Normal rate and regular rhythm.     Pulses: Normal pulses.  Pulmonary:     Effort: Pulmonary effort is normal.     Breath sounds: Normal breath sounds.  Abdominal:     General: Abdomen is flat.  Musculoskeletal: Normal range of motion.  Skin:    General: Skin is warm.     Capillary Refill: Capillary refill takes less than 2 seconds.     Comments: Multiple scratch marks throughout. No vesicular lesions. No obvious petechiae. No vesicles on web spaces   Neurological:     Mental Status: He is alert.     Comments: Actively closing his eyes but alert and oriented and constantly scratching himself. Nl strength and sensation throughout   Psychiatric:     Comments: Depressed, anxious       ED Treatments / Results  Labs (all labs ordered are listed, but only abnormal results are displayed) Labs Reviewed  COMPREHENSIVE METABOLIC PANEL - Abnormal; Notable for the following components:      Result Value   Glucose, Bld 101 (*)    Calcium 8.8 (*)  AST 44 (*)    ALT 51 (*)    All other components within normal limits  ACETAMINOPHEN LEVEL - Abnormal; Notable for the following components:   Acetaminophen (Tylenol), Serum <10 (*)    All other components within normal limits  CBC - Abnormal; Notable for the following components:   Hemoglobin 12.6 (*)    HCT 37.7 (*)    All other components within normal limits  RAPID URINE DRUG SCREEN, HOSP PERFORMED - Abnormal; Notable for the following components:   Amphetamines POSITIVE (*)    Tetrahydrocannabinol POSITIVE (*)    All other components within normal limits  ETHANOL  SALICYLATE LEVEL    EKG None  Radiology No results found.  Procedures Procedures (including critical care time)  Medications Ordered in ED Medications - No data to display   Initial Impression / Assessment and Plan / ED Course  I have  reviewed the triage vital signs and the nursing notes.  Pertinent labs & imaging results that were available during my care of the patient were reviewed by me and considered in my medical decision making (see chart for details).       Deanna ArtisJamie Reeves is a 38 y.o. male here with visual hallucinations and seeing bugs crawling on his skin .  I do not see any signs of cellulitis or signs of scabies or tick bites.  He denies any drug use but there is amphetamines in his system.  He appears depressed and anxious as well.  No previous psych history on him.  Will get medical clearance labs and consult TTS.  11:04 PM Medically cleared for psych eval. Patient not under IVC. Dispo per psych    Final Clinical Impressions(s) / ED Diagnoses   Final diagnoses:  None    ED Discharge Orders    None       Charlynne PanderYao, David Hsienta, MD 12/01/18 2305

## 2018-12-01 NOTE — ED Notes (Signed)
Pt yelling and becoming aggressive with staff. [employer] and security present. Pt cooperative at this time.

## 2018-12-01 NOTE — ED Notes (Signed)
Patient back from CT.

## 2018-12-01 NOTE — ED Triage Notes (Signed)
Per [facility] patient coming for hallucinations, states there are bugs all over him. "they are in my skin, my hands and my blood, help me."  Patent unable to provide any medical history at this time.

## 2018-12-01 NOTE — ED Notes (Signed)
Patients belongings placed in locker 3

## 2018-12-01 NOTE — ED Notes (Signed)
PT not speaking to staff. Eating food and ignoring request for vital signs.

## 2018-12-02 DIAGNOSIS — F23 Brief psychotic disorder: Secondary | ICD-10-CM | POA: Diagnosis present

## 2018-12-02 LAB — SARS CORONAVIRUS 2 BY RT PCR (HOSPITAL ORDER, PERFORMED IN ~~LOC~~ HOSPITAL LAB): SARS Coronavirus 2: NEGATIVE

## 2018-12-02 MED ORDER — OLANZAPINE 5 MG PO TABS
5.0000 mg | ORAL_TABLET | Freq: Every day | ORAL | Status: DC
  Administered 2018-12-02 – 2018-12-07 (×6): 5 mg via ORAL
  Filled 2018-12-02 (×6): qty 1

## 2018-12-02 MED ORDER — DIPHENHYDRAMINE HCL 25 MG PO CAPS
25.0000 mg | ORAL_CAPSULE | Freq: Four times a day (QID) | ORAL | Status: DC | PRN
  Administered 2018-12-03 – 2018-12-08 (×9): 25 mg via ORAL
  Filled 2018-12-02 (×10): qty 1

## 2018-12-02 NOTE — ED Notes (Addendum)
Pt sleeping. 

## 2018-12-02 NOTE — Progress Notes (Signed)
This patient continues to meet inpatient criteria. CSW faxed information to the following facilities:   [facility] does not have an appropriate bed at this time.  Stephanie Acre, LCSW-A Clinical Social Worker

## 2018-12-02 NOTE — BH Assessment (Signed)
Tele Assessment Note   Patient Name: Joshua ArtisJamie Reeves MRN: [id number] Referring Physician: Dr. Silverio LayYao Location of [facility]: [facility] Location of Provider: Behavioral Health TTS Department  Joshua ArtisJamie Reeves is an 38 y.o. male.  -Clinician reviewed note by Dr. Silverio LayYao.  Joshua ArtisJamie Reeves is a 38 y.o. male here with visual hallucinations.  Patient states that he sees bugs crawling all over him.  States that they are coming out of his skin.  He told me that he is not taking any drugs but there is amphetamine and marijuana in his system.  He is not having any auditory hallucinations.  Patient unable to tell me what medical problem he has .  He apparently was aggressive with staff and [employer] was called.  Unable to find any medical history in his chart.  He refused to him if he had previous psych admissions or not.  Patient states that he is anxious and depressed but denies any suicidal ideations.  Patient is laying in bed and is irritated at having to participate in assessment.  He frowns and does not readily answer questions.  Patient was able to [facility]  After that, patient started screaming that he wanted the lights out and the bugs were still crawling on him.  At that point clinician ended the assessment since patient would not calm down and there was little chance of cooperation from patient.    There is no previous history in record so it is unknown whether patient has had any recent service provision.  -Clinician discussed patient care with Lerry Linerashaun Dixon, NP who recommended patient be observed overnight and have psychiatry review in AM.  Clinician informed Dr. Wilkie AyeHorton of disposition and she was fine with it.  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    Family History: No family history on file.  Social History:  has no history on file for tobacco, alcohol, and drug.  Additional  Social History:  Alcohol / Drug Use Pain Medications: Unknown Prescriptions: Unknown Over the Counter: Unknown History of alcohol / drug use?: Yes Substance #1 Name of Substance 1: Amphetamines 1 - Age of First Use: Unknown 1 - Amount (size/oz): Unknown 1 - Frequency: Unknown 1 - Duration: Unknown 1 - Last Use / Amount: Pt postivie for amphetamines in UDS Substance #2 Name of Substance 2: Marijuana 2 - Age of First Use: Unknown 2 - Amount (size/oz): Unknown 2 - Frequency: Unknown 2 - Duration: Unknown 2 - Last Use / Amount: Pt positive for THC in UDS.  CIWA: CIWA-Ar BP: 129/81 Pulse Rate: 75 COWS:    Allergies: Not on File  Home Medications: (Not in a hospital admission)   OB/GYN Status:  No LMP for male patient.  General Assessment Data [facility] Assessment: In system Is this a Tele or Face-to-Face Assessment?: Tele Assessment Is this an Initial Assessment or a Re-assessment for this encounter?: Initial Assessment Patient Accompanied by:: N/A Language Other than English: No Living Arrangements: Other (Comment)(Unknown) What gender do you identify as?: Male Marital status: Single Pregnancy Status: No Living Arrangements: Other (Comment)([place]) Can pt return to current living arrangement?: Yes Admission Status: Voluntary Is patient capable of signing voluntary admission?: Yes Referral Source: Other(Pt brought in by EMS) Insurance type: self pay     Crisis Care Plan Living Arrangements: Other (Comment)([place]) Name of Psychiatrist: Unknown Name of Therapist: unknown  Education Status Is patient currently in school?: No Is the patient employed, unemployed or receiving disability?: Unemployed  Risk to self with the past 6 months Suicidal Ideation: No Has patient been a risk to self within the past 6 months prior to admission? : No Suicidal Intent: No Has patient had any suicidal intent within the past 6 months prior to admission?  : No Is patient at risk for suicide?: No Suicidal Plan?: No Has patient had any suicidal plan within the past 6 months prior to admission? : No Access to Means: No What has been your use of drugs/alcohol within the last 12 months?: Amphetamines, marijuana Previous Attempts/Gestures: (Unknown) How many times?: (Unknown) Other Self Harm Risks: SA issues Triggers for Past Attempts: None known Intentional Self Injurious Behavior: None Family Suicide History: Unknown Recent stressful life event(s): Turmoil (Comment)(Recent drug use and hallucinations) Persecutory voices/beliefs?: Yes Depression: Yes Depression Symptoms: Feeling angry/irritable Substance abuse history and/or treatment for substance abuse?: Yes Suicide prevention information given to non-admitted patients: Not applicable  Risk to Others within the past 6 months Homicidal Ideation: No Does patient have any lifetime risk of violence toward others beyond the six months prior to admission? : No Thoughts of Harm to Others: No Current Homicidal Intent: No Current Homicidal Plan: No Access to Homicidal Means: No Identified Victim: No one History of harm to others?: (Unknown) Assessment of Violence: On admission Violent Behavior Description: Pt was aggressive w/ staff at [facility] Does patient have access to weapons?: No Criminal Charges Pending?: (UTA) Does patient have a court date: (UTA) Is patient on probation?: Unknown  Psychosis Hallucinations: Visual, Tactile(Pt feeling and seeing bugs crawling on him.) Delusions: Somatic, Persecutory  Mental Status Report Appearance/Hygiene: Disheveled Eye Contact: Poor Motor Activity: Freedom of movement Speech: Argumentative, Aggressive, Rapid, Pressured, Loud Level of Consciousness: Alert, Irritable Mood: Anxious, Apprehensive, Helpless Affect: Irritable, Apprehensive Anxiety Level: Severe Thought Processes: Irrelevant Judgement: Impaired Orientation: Unable to  assess Obsessive Compulsive Thoughts/Behaviors: Unable to Assess  Cognitive Functioning Concentration: Unable to Assess Memory: Unable to Assess Is patient IDD: No Insight: Unable to Assess Impulse Control: Poor Appetite: (Unknown, probably poor) Have you had any weight changes? : No Change Sleep: Unable to Assess Total Hours of Sleep: (Pt using amphetamines) Vegetative Symptoms: Unable to Assess  ADLScreening [facility] Assessment Services) Patient's cognitive ability adequate to safely complete daily activities?: Yes Patient able to express need for assistance with ADLs?: Yes Independently performs ADLs?: Yes (appropriate for developmental age)  Prior Inpatient Therapy Prior Inpatient Therapy: Yes Prior Therapy Dates: Unknown Prior Therapy Facilty/Provider(s): Unknown Reason for Treatment: Unknown  Prior Outpatient Therapy Prior Outpatient Therapy: Yes Prior Therapy Dates: Unknown Prior Therapy Facilty/Provider(s): Unknown Reason for Treatment: Unknown Does patient have an ACCT team?: No Does patient have Intensive In-House Services?  : No Does patient have [facility] services? : No Does patient have P4CC services?: No  ADL Screening (condition at time of admission) Patient's cognitive ability adequate to safely complete daily activities?: Yes Patient able to express need for assistance with ADLs?: Yes Independently performs ADLs?: Yes (appropriate for developmental age)             Regulatory affairs officer (For Healthcare) Does Patient Have a Medical Advance Directive?: No Would patient like information on creating a medical advance directive?: No - Patient declined          Disposition:  Disposition Initial Assessment Completed for this Encounter: Yes Patient referred to: (Psychiatry to evaluate in AM)  This service was provided via telemedicine  using a 2-way, interactive audio and Immunologist.  Names of all persons participating in this telemedicine service  and their role in this encounter. Name: Joshua Davila Role: patient  Name: Beatriz Stallion, M.S. LCAS QP Role: clinician  Name:  Role:   Name:  Role:     Alexandria Lodge 12/02/2018 12:19 AM

## 2018-12-02 NOTE — Care Management (Signed)
[facility] reports that they are at capacity.

## 2018-12-02 NOTE — ED Notes (Addendum)
Pt belongings (2 bags) placed in locker 2, pt declines to sign inventory form. Otherwise, pt is calm and cooperative, continues to report "it feels like I have bugs under my skin." Ambulates without assistive device. Now resting with lights off.

## 2018-12-02 NOTE — ED Notes (Signed)
Dinner tray ordered.

## 2018-12-02 NOTE — ED Notes (Signed)
Sitting up in bed, eating breakfast

## 2018-12-02 NOTE — ED Notes (Signed)
When TTS device placed in room, pt became agitated and left room stating "I don't to be in there with that thing." This RN and tech agree that pt was trying to get away from device and not leave the unit. Explained to pt that the device was to allow him to speak with [facility]; pt allowed device to be present for brief conversation with [facility].

## 2018-12-02 NOTE — Consult Note (Signed)
Telepsych Consultation   Reason for Consult:  Hallucinations  Referring Physician:  EDP Location of [facility]: [facility] Location of Provider: Behavioral Health TTS Department  Patient Identification: Joshua Davila MRN:  [id number] Principal Diagnosis: Brief psychotic disorder (HCC) Diagnosis:  Principal Problem:   Brief psychotic disorder (HCC)   Total Time spent with patient: 15 minutes  Subjective:   Joshua Davila is a 38 y.o. male patient admitted with hallucinations that it is felt to be substance induced. My name is Joshua Davila. He goes on to further identify his mother name and his father as Joshua Davila. He then states he has bugs crawling on him and its going to kill him. I need help. Somebody help me. Then patient proceeds to leave the room. Telepsych is ended.   HPI:  Clinician reviewed note by Dr. Silverio LayYao.  Joshua MuirJamie Reevesis a 38 y.o.malehere withvisual hallucinations.Patient states that he sees bugs crawling all over him. States that they are coming out of his skin. He told me that he is not taking any drugs but there is amphetamine and marijuana in his system. He is not having any auditory hallucinations. Patient unable to tell me what medical problem he has . He apparently was aggressive with staff and [employer] was called.Unable to find any medical history in his chart. He refused to him if he had previous psych admissions or not. Patient states that he is anxious and depressed but denies any suicidal ideations.  Patient is laying in bed and is irritated at having to participate in assessment.  He frowns and does not readily answer questions.  Patient was able to tell [facility] and [facility]  After that, patient started screaming that he wanted the lights out and the bugs were still crawling on him.  At that point clinician ended the assessment since patient would not calm down and there was little chance of cooperation from patient.    There is  no previous history in record so it is unknown whether patient has had any recent service provision.   Unable to assess at this time. Patient remains a level 5 caveat.  Past Psychiatric History: Unable to assess  Risk to Self: Suicidal Ideation: No Suicidal Intent: No Is patient at risk for suicide?: No Suicidal Plan?: No Access to Means: No What has been your use of drugs/alcohol within the last 12 months?: Amphetamines, marijuana How many times?: (Unknown) Other Self Harm Risks: SA issues Triggers for Past Attempts: None known Intentional Self Injurious Behavior: None Risk to Others: Homicidal Ideation: No Thoughts of Harm to Others: No Current Homicidal Intent: No Current Homicidal Plan: No Access to Homicidal Means: No Identified Victim: No one History of harm to others?: (Unknown) Assessment of Violence: On admission Violent Behavior Description: [facility] Does patient have access to weapons?: No Criminal Charges Pending?: (UTA) Does patient have a court date: (UTA) Prior Inpatient Therapy: Prior Inpatient Therapy: Yes Prior Therapy Dates: Unknown Prior Therapy Facilty/Provider(s): Unknown Reason for Treatment: Unknown Prior Outpatient Therapy: Prior Outpatient Therapy: Yes Prior Therapy Dates: Unknown Prior Therapy Facilty/Provider(s): Unknown Reason for Treatment: Unknown Does patient have an ACCT team?: No Does patient have Intensive In-House Services?  : No Does patient have [facility] services? : No Does patient have P4CC services?: No  Past Medical History: No past medical history on file.  Family History: No family history on file. Family Psychiatric  History: Unable to assess Social History:  Social History   Substance and Sexual Activity  Alcohol Use Not on file     Social History   Substance and Sexual Activity  Drug Use Not on file    Social History   Socioeconomic History  . Marital status: Single    Spouse name: Not on  file  . Number of children: Not on file  . Years of education: Not on file  . Highest education level: Not on file  Occupational History  . Not on file  Social Needs  . Financial resource strain: Not on file  . Food insecurity    Worry: Not on file    Inability: Not on file  . Transportation needs    Medical: Not on file    Non-medical: Not on file  Tobacco Use  . Smoking status: Not on file  Substance and Sexual Activity  . Alcohol use: Not on file  . Drug use: Not on file  . Sexual activity: Not on file  Lifestyle  . Physical activity    Days per week: Not on file    Minutes per session: Not on file  . Stress: Not on file  Relationships  . Social Musicianconnections    Talks on phone: Not on file    Gets together: Not on file    Attends religious service: Not on file    Active member of club or organization: Not on file    Attends meetings of clubs or organizations: Not on file    Relationship status: Not on file  Other Topics Concern  . Not on file  Social History Narrative  . Not on file   Additional Social History:    Allergies:  No Known Allergies  Labs:  Results for orders placed or performed during the hospital encounter of 12/01/18 (from the past 48 hour(s))  Comprehensive metabolic panel     Status: Abnormal   Collection Time: 12/01/18  3:06 PM  Result Value Ref Range   Sodium 139 135 - 145 mmol/L   Potassium 3.7 3.5 - 5.1 mmol/L   Chloride 105 98 - 111 mmol/L   CO2 23 22 - 32 mmol/L   Glucose, Bld 101 (H) 70 - 99 mg/dL   BUN 14 6 - 20 mg/dL   Creatinine, Ser 1.611.24 0.61 - 1.24 mg/dL   Calcium 8.8 (L) 8.9 - 10.3 mg/dL   Total Protein 6.5 6.5 - 8.1 g/dL   Albumin 3.9 3.5 - 5.0 g/dL   AST 44 (H) 15 - 41 U/L   ALT 51 (H) 0 - 44 U/L   Alkaline Phosphatase 59 38 - 126 U/L   Total Bilirubin 0.4 0.3 - 1.2 mg/dL   GFR calc non Af Amer >60 >60 mL/min   GFR calc Af Amer >60 >60 mL/min   Anion gap 11 5 - 15    Comment: Performed at [facility], [address] [place], [place] [address]  Ethanol     Status: None   Collection Time: 12/01/18  3:06 PM  Result Value Ref Range   Alcohol, Ethyl (B) <10 <10 mg/dL    Comment: (NOTE) Lowest detectable limit for serum alcohol is 10 mg/dL. For medical purposes only. Performed at [facility], [address] [place], [place] [address]   Salicylate level     Status: None   Collection Time: 12/01/18  3:06 PM  Result Value Ref Range   Salicylate Lvl <7.0 2.8 - 30.0 mg/dL    Comment:  Performed at [facility], [address] [place], [place] [address]  Acetaminophen level     Status: Abnormal   Collection Time: 12/01/18  3:06 PM  Result Value Ref Range   Acetaminophen (Tylenol), Serum <10 (L) 10 - 30 ug/mL    Comment: (NOTE) Therapeutic concentrations vary significantly. A range of 10-30 ug/mL  may be an effective concentration for many patients. However, some  are best treated at concentrations outside of this range. Acetaminophen concentrations >150 ug/mL at 4 hours after ingestion  and >50 ug/mL at 12 hours after ingestion are often associated with  toxic reactions. Performed at [facility], [address], [place], [place] [address]   cbc     Status: Abnormal   Collection Time: 12/01/18  3:06 PM  Result Value Ref Range   WBC 5.5 4.0 - 10.5 K/uL   RBC 4.24 4.22 - 5.81 MIL/uL   Hemoglobin 12.6 (L) 13.0 - 17.0 g/dL   HCT 07.3 (L) 71.0 - 62.6 %   MCV 88.9 80.0 - 100.0 fL   MCH 29.7 26.0 - 34.0 pg   MCHC 33.4 30.0 - 36.0 g/dL   RDW 94.8 54.6 - 27.0 %   Platelets 213 150 - 400 K/uL   nRBC 0.0 0.0 - 0.2 %    Comment: Performed at [facility], [address], [place], [place] [address]  Rapid urine drug screen (hospital performed)     Status: Abnormal   Collection Time: 12/01/18  5:46 PM  Result Value Ref Range   Opiates NONE DETECTED NONE DETECTED   Cocaine NONE DETECTED NONE DETECTED   Benzodiazepines NONE DETECTED NONE DETECTED   Amphetamines POSITIVE (A)  NONE DETECTED   Tetrahydrocannabinol POSITIVE (A) NONE DETECTED   Barbiturates NONE DETECTED NONE DETECTED    Comment: (NOTE) DRUG SCREEN FOR MEDICAL PURPOSES ONLY.  IF CONFIRMATION IS NEEDED FOR ANY PURPOSE, NOTIFY LAB WITHIN 5 DAYS. LOWEST DETECTABLE LIMITS FOR URINE DRUG SCREEN Drug Class                     Cutoff (ng/mL) Amphetamine and metabolites    1000 Barbiturate and metabolites    200 Benzodiazepine                 200 Tricyclics and metabolites     300 Opiates and metabolites        300 Cocaine and metabolites        300 THC                            50 Performed at [facility], [address] [place], [place] [address]     Medications:  Current Facility-Administered Medications  Medication Dose Route Frequency Provider Last Rate Last Dose  . acetaminophen (TYLENOL) tablet 650 mg  650 mg Oral Q6H PRN Charlynne Pander, MD      . LORazepam (ATIVAN) tablet 1 mg  1 mg Oral Q4H PRN Charlynne Pander, MD   1 mg at 12/01/18 2354   No current outpatient medications on file.    Musculoskeletal: Strength & Muscle Tone: within normal limits Gait & Station: normal Patient leans: N/A  Psychiatric Specialty Exam: Physical Exam  ROS  Blood pressure 121/78, pulse (!) 59, temperature 97.6 F (36.4 C), temperature source Oral, resp. rate 18, SpO2 100 %.There is no height or weight on file to calculate BMI.  General Appearance:  Disheveled  Eye Contact:  Fair  Speech:  Pressured  Volume:  Normal  Mood:  Angry and Irritable  Affect:  Inappropriate and Full Range  Thought Process:  Disorganized, Irrelevant and Descriptions of Associations: Loose  Orientation:  Full (Time, Place, and Person)  Thought Content:  Delusions and Hallucinations: Visual  Suicidal Thoughts:  No  Homicidal Thoughts:  No  Memory:  Immediate;   Unable to assess Recent;   Unable to assess  Judgement:  Impaired  Insight:  Lacking  Psychomotor Activity:  Increased and Restlessness   Concentration:  Concentration: Unable to assess and Attention Span: Unable to assess  Recall:  Unable to assess  Fund of Knowledge:  Unable to assess  Language:  Unable to assess  Akathisia:  Unable to assess  Handed:  Right  AIMS (if indicated):     Assets:  Communication Skills Desire for Improvement Financial Resources/Insurance Housing Physical Health  ADL's:  Intact  Cognition:  WNL  Sleep:        Treatment Plan Summary: Daily contact with patient to assess and evaluate symptoms and progress in treatment, Medication management and Plan Recommend inpatient once medically cleared.   Disposition: Recommend psychiatric Inpatient admission when medically cleared.  This service was provided via telemedicine using a 2-way, interactive audio and video technology.  Names of all persons participating in this telemedicine service and their role in this encounter. Name: Benjamine Sprague Role: Patient  Name: Burt Ek Role: FNP  Name: Letitia Libra Role: FNP  Name:  Role:     Suella Broad, FNP 12/02/2018 10:57 AM

## 2018-12-02 NOTE — ED Provider Notes (Signed)
Emergency Medicine Observation Re-evaluation Note  Joshua Davila is a 38 y.o. male, seen on rounds today.  Pt initially presented to the ED for complaints of Hallucinations Currently, the patient is resting comfortably.  Physical Exam  BP 118/66 (BP Location: Left Arm)   Pulse 63   Temp 97.8 F (36.6 C) (Oral)   Resp 16   SpO2 100%  Physical Exam Face to face Exam:   General: Awake  HEENT: Atraumatic  Resp: Normal effort  Abd: Nondistended  Neuro:No focal weakness  Lymph: alert   ED Course / MDM  EKG:    I have reviewed the labs performed to date as well as medications administered while in observation. No recent changes in the last 24 hours. Plan  Current plan is for inpatient placement. Social worker is working on placement. Patient is not under full IVC at this time.   Flint Melter 12/02/18 1936    Lucrezia Starch, MD 12/06/18 [phone]

## 2018-12-02 NOTE — Care Management (Signed)
[facility] is reviewing the patient.

## 2018-12-02 NOTE — ED Notes (Signed)
Breakfast ordered 

## 2018-12-03 NOTE — ED Triage Notes (Signed)
Pt at desk requesting to take a shower.

## 2018-12-03 NOTE — ED Triage Notes (Signed)
PT awake and alert walking to BR . No complaints voiced.

## 2018-12-03 NOTE — BH Assessment (Signed)
[place] Assessment Progress Note    Patient continues to have tactile hallucinations and states that he feels like he has bugs coming out of his skin.  He states, "I think I need to have surgery."  Patient is agitated/irritable.  States that he was unable to sleep due to his hallucinations.  Continued inpatient is recommended.

## 2018-12-03 NOTE — ED Triage Notes (Signed)
PT refuses to have lights on while TTS  Was done.  Pt then pushed monitor in hall and closed door. Pt refuses to leave door open. Pt has food tray in room but refuses to eat.

## 2018-12-03 NOTE — ED Provider Notes (Signed)
Emergency department observation rounding note  38 year old male has been in this emergency department for greater than 43 hours at this time.  Originally presented for hallucinations.  Medically cleared by previous team.  No overnight events to report per Laurence Aly.  Patient recently underwent TTS evaluation and is awaiting behavioral health recommendations.  COVID negative UDS positive for amphetamines and THC Tylenol negative Salicylate negative Ethanol negative CBC nonacute CMP nonacute CT head:    IMPRESSION:  No acute intracranial pathology.    Physical Exam  BP 110/64 (BP Location: Right Arm)   Pulse 70   Temp 98.4 F (36.9 C) (Oral)   Resp 18   SpO2 100%   Physical Exam Constitutional:      General: He is not in acute distress.    Appearance: Normal appearance. He is well-developed. He is not ill-appearing or diaphoretic.  HENT:     Head: Normocephalic and atraumatic.     Right Ear: External ear normal.     Left Ear: External ear normal.     Nose: Nose normal.  Eyes:     General: Vision grossly intact. Gaze aligned appropriately.     Pupils: Pupils are equal, round, and reactive to light.  Neck:     Musculoskeletal: Normal range of motion.     Trachea: Trachea and phonation normal. No tracheal deviation.  Pulmonary:     Effort: Pulmonary effort is normal. No respiratory distress.  Musculoskeletal: Normal range of motion.  Skin:    General: Skin is warm and dry.  Neurological:     Mental Status: He is alert.     GCS: GCS eye subscore is 4. GCS verbal subscore is 5. GCS motor subscore is 6.     Comments: Speech is clear and goal oriented, follows commands Major Cranial nerves without deficit, no facial droop Moves extremities without ataxia, coordination intact  Psychiatric:        Behavior: Behavior normal.     ED Course/Procedures     Procedures  MDM  Patient evaluated while standing at nurses station, he is having calm conversation with staff.   He appears comfortable in no acute distress, continues to await behavioral health recommendations.  Vital signs 12/03/2018 6:34 AM: Temperature 98.4 F, pulse 70 bpm, blood pressure 110/64, respiratory rate 18, SPO2 100% on room air. - Patient remains medically cleared at this time awaiting [facility] recommendations.   Note: Portions of this report may have been transcribed using voice recognition software. Every effort was made to ensure accuracy; however, inadvertent computerized transcription errors may still be present.   Gari Crown 12/03/18 1046    Little, Wenda Overland, MD 12/03/18 1143

## 2018-12-03 NOTE — ED Triage Notes (Signed)
TTS done 

## 2018-12-03 NOTE — Progress Notes (Signed)
[facility] is at capacity.   Netta Neat, MSW, LCSW Clinical Social Work

## 2018-12-03 NOTE — Progress Notes (Signed)
This patient continues to meet inpatient criteria. CSW re-faxed information to the following facilities:   [facility] does not have an appropriate bed at this time.    Netta Neat, MSW, LCSW Clinical Social Work

## 2018-12-04 NOTE — Progress Notes (Signed)
This patient continues to meet inpatient criteria. The following referrals were faxed on 12/03/2018:  [facility]- no beds available today (09/13), will review [facility]- potential bed availability today (09/13), will review [facility] does not have an appropriate bed at this time.  Stephanie Acre, LCSW-A Clinical Social Worker

## 2018-12-04 NOTE — ED Notes (Signed)
Lunch tray ordered 

## 2018-12-04 NOTE — ED Notes (Signed)
Pt continues to complain of bugs crawling on him, showed this RN place where he had scratched his skin raw (scabbed over at this time.) PRN benedryl given as requested by pt.

## 2018-12-04 NOTE — ED Notes (Signed)
Dinner tray ordered.

## 2018-12-04 NOTE — ED Provider Notes (Signed)
Emergency Medicine Observation Re-evaluation Note  Joshua Davila is a 38 y.o. male, seen on rounds today.  Pt initially presented to the ED for complaints of Hallucinations Currently, the patient is currently awaiting inpatient psych placement.  No overnight events per nursing.  Physical Exam  BP 112/74 (BP Location: Right Arm)   Pulse (!) 57   Temp 97.6 F (36.4 C) (Oral)   Resp 18   SpO2 100%  Physical Exam Vitals signs and nursing note reviewed.  Constitutional:      General: He is not in acute distress.    Appearance: He is well-developed.     Comments: Patient is sleeping  HENT:     Head: Normocephalic and atraumatic.  Cardiovascular:     Rate and Rhythm: Normal rate.  Pulmonary:     Effort: Pulmonary effort is normal.  Psychiatric:        Mood and Affect: Mood normal.        Behavior: Behavior normal.     ED Course / MDM  EKG:    I have reviewed the labs performed to date as well as medications administered while in observation.  No recent changes in the last 24 hours. Plan  Current plan is for awaiting inpatient psych placement. Patient is not under full IVC at this time.   Bethany Hirt, [place] N, PA-C 12/04/18 [place], DO 12/04/18 1413

## 2018-12-04 NOTE — BHH Counselor (Signed)
Pt was reassessed this AM.  He was awakened and could hear and speak to Chief Strategy Officer.  Pt refused to answer questions, stating only ''Leave me alone.''  Recommend continued inpatient.   38 y.o.malehere withvisual hallucinations.Patient states that he sees bugs crawling all over him. States that they are coming out of his skin. He told me that he is not taking any drugs but there is amphetamine and marijuana in his system. He is not having any auditory hallucinations. Patient unable to tell me what medical problem he has . He apparently was aggressive with staff and [employer] was called.Unable to find any medical history in his chart. He refused to him if he had previous psych admissions or not. Patient states that he is anxious and depressed but denies any suicidal ideations.

## 2018-12-05 MED ORDER — LORAZEPAM 1 MG PO TABS
1.0000 mg | ORAL_TABLET | ORAL | Status: AC | PRN
  Administered 2018-12-05: 1 mg via ORAL
  Filled 2018-12-05: qty 1

## 2018-12-05 MED ORDER — ZIPRASIDONE MESYLATE 20 MG IM SOLR: 10.0000 mg | Freq: Once | INTRAMUSCULAR | Status: DC

## 2018-12-05 MED ORDER — NICOTINE 21 MG/24HR TD PT24
21.0000 mg | MEDICATED_PATCH | Freq: Once | TRANSDERMAL | Status: AC
  Administered 2018-12-05: 21 mg via TRANSDERMAL
  Filled 2018-12-05: qty 1

## 2018-12-05 MED ORDER — OLANZAPINE 5 MG PO TABS
5.0000 mg | ORAL_TABLET | Freq: Two times a day (BID) | ORAL | Status: DC
  Administered 2018-12-05 – 2018-12-08 (×6): 5 mg via ORAL
  Filled 2018-12-05 (×5): qty 1

## 2018-12-05 NOTE — ED Provider Notes (Signed)
Discussed with psychiatry, they recommend Zyprexa 5 mg twice daily, they are continuing to look for placement.   Noemi Chapel, MD 12/05/18 1106

## 2018-12-05 NOTE — ED Notes (Signed)
Pt talking with NP on TTS from [facility]

## 2018-12-05 NOTE — ED Notes (Signed)
Pt states that he is in "physical pain everywhere" . When asked if he was hearing voices, he became agitated, stating "why don't you ask me better questions! I have pain, real pain!" ambulated to the BR without difficulty,

## 2018-12-05 NOTE — ED Notes (Signed)
Patient showering at this time. 

## 2018-12-05 NOTE — ED Notes (Signed)
Pt came out to the desk asking for "nicotine" , explained that he got a new patch this am. Then he asked for "opiods-- anything, something different"  This nurse witnessed pt eating sugar straight from sugar packet. Told patient that he could nto have any more coffee until after dinner after the cup he has (DECAF and SUGAR FREE only)

## 2018-12-05 NOTE — Progress Notes (Signed)
Patient ID: Joshua Davila, male   DOB: 11/10/1980, 38 y.o.   MRN: [id number]   Reassessment   In brief; Joshua Davila is an 38 y.o. male who presented to the ED with complaints of visual hallucinations.He initially endorsed seeing bugs crawling all over him. He stated that they were coming out of his skin.  During this evaluation, his reports are unchanged. He states, " parasites are in my ears , nose, and mouth. They started to come out about a week ago when I was in the shower. I was washing my dred locks and I had to cut them off because they were in there. When I was cleaning my blinds and curtains at home they started to jump out and they made me sick. They look like leaches and snakes. The bugs are coming out my feet. I need to get clean and get the bugs out of me. I am depressed because the bugs are making me sick." Patient denies any SI, HI or AH. He denies any drug use despite being told his UDS was positive for amphetamine and marijuana. He becomes slightly irritable when talking about his positive UDS. As per nursing, he has had some instances of irritability on the unit.   Based off this evaluation, it is in my opinion that patient continues to meet inpatient psychiatric hospitalization. Due to some reports of irritability and ongoing tactile and visual hallucinations I am recommending Zyprexa BID. Prior to starting this medication, and EKG should be completed and evaluated by MD to monitor QTc interval.     Dr. Sabra Heck, EDP updated on current disposition.

## 2018-12-05 NOTE — ED Notes (Signed)
TTS completed. 

## 2018-12-06 ENCOUNTER — Other Ambulatory Visit: Payer: Self-pay

## 2018-12-06 MED ORDER — NICOTINE 21 MG/24HR TD PT24
21.0000 mg | MEDICATED_PATCH | Freq: Every day | TRANSDERMAL | Status: DC
  Administered 2018-12-06 – 2018-12-08 (×2): 21 mg via TRANSDERMAL
  Filled 2018-12-06 (×2): qty 1

## 2018-12-06 NOTE — ED Notes (Signed)
Pt noted to be alert, ambulating in room, and to bathroom w/o difficulty.

## 2018-12-06 NOTE — BHH Counselor (Signed)
Re-assessment:   Patient re-assessed an appears to be experiencing delusional behaviors. During evaluation patient discussed parasites/bugs living all over his home. Report the bugs are everyone and he cannot return home until they are removed. Per [name], NP, notes on 12/05/2018 patient discussed parasites in his ears, nose and mouth. He discussed how the bugs are coming out of his feet. UDS was positive for amphetamine and marijuana.   Disposition:   Joshua Chou, NP, continue to recommend inpatient treatment due to ongoing tactile and visual hallucinations.

## 2018-12-06 NOTE — ED Notes (Signed)
Pt now awake - eating snack given. 

## 2018-12-06 NOTE — ED Notes (Signed)
Pt noted to continue w/[facility]H - states "I see bugs everywhere and they are on me". Pt denies drug/ETOH abuse and became irritable w/RN when asking pt questions. States "that's not helping me. Why are you asking me these questions?" "You need to let the people know who live w/me that there are bugs in there". Pt states he came to ED via EMS. Denies being naked as this RN was advised in report. States "I had clothes on". Pt sitting on bed looking away from RN - min eye contact. Pt asking for meds to "calm me down". States "I need Nicotine, caffeine, and Zyprexa". Nicotine Patch applied, advised pt he may have coffee w/lunch, and that Zyprexa was given w/AM meds. Pt voiced understanding.

## 2018-12-07 NOTE — ED Notes (Signed)
Pt  Mom [phone]

## 2018-12-07 NOTE — ED Notes (Signed)
Pt's mother called earlier, pt does not wish to return call at this time

## 2018-12-07 NOTE — Progress Notes (Signed)
Pt continues to meet inpatient criteria. Referral information has been re-sent to the following hospitals for review:  [facility]  [facility]  Disposition will continue to follow.   Audree Camel, LCSW, [place] Disposition [facility]/TTS [phone]

## 2018-12-07 NOTE — ED Notes (Signed)
Patient denies pain and is resting comfortably.  

## 2018-12-08 NOTE — Discharge Instructions (Signed)
follow-up with [facility] for your outpatient psychiatric needs.    Further recommendation include;     Abstain from all illicit substances and alcohol.  If  symptoms worsen or do not continue to improve or if the patient becomes actively suicidal or homicidal then it is recommended that the patient return to the closest hospital emergency room or call 911 for further evaluation and treatment.  National Suicide Prevention Lifeline 1800-SUICIDE or (551)082-0264. Educated about removing/locking any firearms, medications or dangerous products from the home.

## 2018-12-08 NOTE — ED Provider Notes (Addendum)
38 year old male was seen on rounds today.  Pt initially presented to the ED for complaints of hallucination.  Currently, the patient is awaiting inpatient psych placement.  No overnight events per nursing.  EKG reviewed by me is without evidence of prolonged QT.  BP 115/73 (BP Location: Left Arm)   Pulse 73   Temp 98.4 F (36.9 C) (Oral)   Resp 20   SpO2 97%    9:58 AM Spoke with [facility] who recommend pt to be discharge with outpt f/u to [facility] for further care.     Domenic Moras, PA-C 12/08/18 0951    Domenic Moras, PA-C 12/08/18 1001    Isla Pence, MD 12/08/18 1011

## 2018-12-08 NOTE — ED Notes (Signed)
Breakfast tray ordered 

## 2018-12-08 NOTE — Progress Notes (Signed)
Patient ID: Holton Sidman, male   DOB: 11-May-1980, 38 y.o.   MRN: [id number]  In brief;  Elon Jester an 37 y.o.male who presented to the ED with complaints of visual hallucinations.He initially endorsed seeing bugs crawling all over him. He stated that they were coming out of his skin. During this evaluation, his reports of bugs crawling out of his skin has lessened. He seems more preoccupied about bugs being restricted to his right hand and he states that he had a cut to his hand and believes that a bug got inside or after he was bit by a pit bull. Compared to my previous assessment, he is showing much improvement in regard to a decrease in his delusional state. He endorses mood as improved and he denies any active or passive suicidal thoughts, homicidal ideas, or auditory hallucinations. He states he does not see any bugs crawling today. Patients UDS was positive for amphetamines and marijuana so I am unclear if his hallucinations were related to his substance use. He was started on Zyprexa which seemed to be helpful.    Based off this evaluation and noted improvement, patient is being psychiatrically cleared. We discussed follow-up care and he states he will pan on follow-up with [facility] for his outpatient psychiatric needs.   Further recommendation include;   1.  Abstain from all illicit substances and alcohol. 2.  If  symptoms worsen or do not continue to improve or if the patient becomes actively suicidal or homicidal then it is recommended that the patient return to the closest hospital emergency room or call 911 for further evaluation and treatment.  National Suicide Prevention Lifeline 1800-SUICIDE or 276-067-8257. 3. Educated about removing/locking any firearms, medications or dangerous products from the home.  Nurse Lowella Petties at [facility] update on current disposition.

## 2018-12-08 NOTE — BH Assessment (Addendum)
[place] Assessment Progress Note   Patient was seen for re-assessment.  Patient continues to have some delusional thinking concerning bugs being under his skin.  However, it is now limited to only his right hand.  He states that he feels a sensation in his hand that makes it feel like it is broken.  He states that a couple of years ago that his pit bull did bite his hand and did break it and he feels like that may have something to do with it.  Patient states that he is feeling much better today and feels like he is safe to go home.  Patient denies SI/HI. TTS attempted to contact Royston Bake, patient's husband for collateral information at [phone], but there was no answer.  HIPPA compliant voicemail was left.

## 2018-12-18 ENCOUNTER — Emergency Department (HOSPITAL_COMMUNITY)
Admission: EM | Admit: 2018-12-18 | Discharge: 2018-12-19 | Disposition: A | Discharge status: Home / Self Care | Payer: Medicare Other | Attending: Emergency Medicine | Admitting: Emergency Medicine

## 2018-12-18 ENCOUNTER — Encounter (HOSPITAL_COMMUNITY): Payer: Self-pay | Admitting: Emergency Medicine

## 2018-12-18 ENCOUNTER — Encounter (HOSPITAL_COMMUNITY): Payer: Self-pay | Admitting: Behavioral Health

## 2018-12-18 ENCOUNTER — Ambulatory Visit (HOSPITAL_COMMUNITY)
Admission: RE | Admit: 2018-12-18 | Discharge: 2018-12-18 | Disposition: A | Discharge status: Home / Self Care | Payer: Medicare Other | Attending: Psychiatry | Admitting: Psychiatry

## 2018-12-18 DIAGNOSIS — F1994 Other psychoactive substance use, unspecified with psychoactive substance-induced mood disorder: Secondary | ICD-10-CM | POA: Diagnosis present

## 2018-12-18 DIAGNOSIS — F1520 Other stimulant dependence, uncomplicated: Secondary | ICD-10-CM | POA: Diagnosis present

## 2018-12-18 DIAGNOSIS — Z20828 Contact with and (suspected) exposure to other viral communicable diseases: Secondary | ICD-10-CM | POA: Diagnosis not present

## 2018-12-18 DIAGNOSIS — F1120 Opioid dependence, uncomplicated: Secondary | ICD-10-CM | POA: Diagnosis present

## 2018-12-18 DIAGNOSIS — R443 Hallucinations, unspecified: Secondary | ICD-10-CM | POA: Diagnosis not present

## 2018-12-18 DIAGNOSIS — F4310 Post-traumatic stress disorder, unspecified: Secondary | ICD-10-CM | POA: Diagnosis present

## 2018-12-18 DIAGNOSIS — R4689 Other symptoms and signs involving appearance and behavior: Secondary | ICD-10-CM

## 2018-12-18 DIAGNOSIS — Z882 Allergy status to sulfonamides status: Secondary | ICD-10-CM | POA: Insufficient documentation

## 2018-12-18 DIAGNOSIS — F19950 Other psychoactive substance use, unspecified with psychoactive substance-induced psychotic disorder with delusions: Secondary | ICD-10-CM | POA: Diagnosis present

## 2018-12-18 DIAGNOSIS — Z03818 Encounter for observation for suspected exposure to other biological agents ruled out: Secondary | ICD-10-CM | POA: Diagnosis not present

## 2018-12-18 DIAGNOSIS — R258 Other abnormal involuntary movements: Secondary | ICD-10-CM | POA: Diagnosis not present

## 2018-12-18 DIAGNOSIS — F250 Schizoaffective disorder, bipolar type: Secondary | ICD-10-CM | POA: Diagnosis present

## 2018-12-18 DIAGNOSIS — F259 Schizoaffective disorder, unspecified: Secondary | ICD-10-CM | POA: Insufficient documentation

## 2018-12-18 DIAGNOSIS — Z880 Allergy status to penicillin: Secondary | ICD-10-CM | POA: Insufficient documentation

## 2018-12-18 DIAGNOSIS — F918 Other conduct disorders: Secondary | ICD-10-CM | POA: Diagnosis not present

## 2018-12-18 DIAGNOSIS — F1220 Cannabis dependence, uncomplicated: Secondary | ICD-10-CM | POA: Diagnosis present

## 2018-12-18 DIAGNOSIS — F1320 Sedative, hypnotic or anxiolytic dependence, uncomplicated: Secondary | ICD-10-CM | POA: Diagnosis present

## 2018-12-18 DIAGNOSIS — F329 Major depressive disorder, single episode, unspecified: Secondary | ICD-10-CM | POA: Insufficient documentation

## 2018-12-18 DIAGNOSIS — F1021 Alcohol dependence, in remission: Secondary | ICD-10-CM

## 2018-12-18 DIAGNOSIS — F19150 Other psychoactive substance abuse with psychoactive substance-induced psychotic disorder with delusions: Secondary | ICD-10-CM | POA: Insufficient documentation

## 2018-12-18 DIAGNOSIS — F17210 Nicotine dependence, cigarettes, uncomplicated: Secondary | ICD-10-CM | POA: Insufficient documentation

## 2018-12-18 DIAGNOSIS — F1910 Other psychoactive substance abuse, uncomplicated: Secondary | ICD-10-CM | POA: Diagnosis present

## 2018-12-18 LAB — RAPID URINE DRUG SCREEN, HOSP PERFORMED
Amphetamines: POSITIVE — AB
Barbiturates: NOT DETECTED
Benzodiazepines: NOT DETECTED
Cocaine: NOT DETECTED
Opiates: NOT DETECTED
Tetrahydrocannabinol: POSITIVE — AB

## 2018-12-18 LAB — CBC WITH DIFFERENTIAL/PLATELET
Abs Immature Granulocytes: 0.02 10*3/uL (ref 0.00–0.07)
Basophils Absolute: 0 10*3/uL (ref 0.0–0.1)
Basophils Relative: 0 %
Eosinophils Absolute: 0 10*3/uL (ref 0.0–0.5)
Eosinophils Relative: 1 %
HCT: 34.8 % — ABNORMAL LOW (ref 39.0–52.0)
Hemoglobin: 11.8 g/dL — ABNORMAL LOW (ref 13.0–17.0)
Immature Granulocytes: 0 %
Lymphocytes Relative: 29 %
Lymphs Abs: 1.7 10*3/uL (ref 0.7–4.0)
MCH: 29.8 pg (ref 26.0–34.0)
MCHC: 33.9 g/dL (ref 30.0–36.0)
MCV: 87.9 fL (ref 80.0–100.0)
Monocytes Absolute: 0.4 10*3/uL (ref 0.1–1.0)
Monocytes Relative: 7 %
Neutro Abs: 3.5 10*3/uL (ref 1.7–7.7)
Neutrophils Relative %: 63 %
Platelets: 191 10*3/uL (ref 150–400)
RBC: 3.96 MIL/uL — ABNORMAL LOW (ref 4.22–5.81)
RDW: 13.9 % (ref 11.5–15.5)
WBC: 5.6 10*3/uL (ref 4.0–10.5)
nRBC: 0 % (ref 0.0–0.2)

## 2018-12-18 LAB — COMPREHENSIVE METABOLIC PANEL
ALT: 41 U/L (ref 0–44)
AST: 33 U/L (ref 15–41)
Albumin: 4.1 g/dL (ref 3.5–5.0)
Alkaline Phosphatase: 51 U/L (ref 38–126)
Anion gap: 8 (ref 5–15)
BUN: 13 mg/dL (ref 6–20)
CO2: 25 mmol/L (ref 22–32)
Calcium: 8.8 mg/dL — ABNORMAL LOW (ref 8.9–10.3)
Chloride: 104 mmol/L (ref 98–111)
Creatinine, Ser: 0.77 mg/dL (ref 0.61–1.24)
GFR calc Af Amer: >60 mL/min (ref >60)
GFR calc non Af Amer: >60 mL/min (ref >60)
Glucose, Bld: 94 mg/dL (ref 70–99)
Potassium: 3.3 mmol/L — ABNORMAL LOW (ref 3.5–5.1)
Sodium: 137 mmol/L (ref 135–145)
Total Bilirubin: 1.1 mg/dL (ref 0.3–1.2)
Total Protein: 6.7 g/dL (ref 6.5–8.1)

## 2018-12-18 LAB — SARS CORONAVIRUS 2 BY RT PCR (HOSPITAL ORDER, PERFORMED IN ~~LOC~~ HOSPITAL LAB): SARS Coronavirus 2: NEGATIVE

## 2018-12-18 LAB — ETHANOL: Alcohol, Ethyl (B): <10 mg/dL (ref <10)

## 2018-12-18 MED ORDER — ZIPRASIDONE MESYLATE 20 MG IM SOLR
20.0000 mg | INTRAMUSCULAR | Status: AC | PRN
  Administered 2018-12-18: 17:00:00 20 mg via INTRAMUSCULAR
  Filled 2018-12-18: qty 20

## 2018-12-18 MED ORDER — ACETAMINOPHEN 325 MG PO TABS
650.0000 mg | ORAL_TABLET | ORAL | Status: DC | PRN
  Administered 2018-12-19: 650 mg via ORAL
  Filled 2018-12-18: qty 2

## 2018-12-18 MED ORDER — RISPERIDONE 1 MG PO TBDP: 2.0000 mg | ORAL_TABLET | Freq: Three times a day (TID) | ORAL | Status: DC | PRN

## 2018-12-18 MED ORDER — STERILE WATER FOR INJECTION IJ SOLN
INTRAMUSCULAR | Status: AC
  Administered 2018-12-18: 17:00:00 1.2 mL
  Filled 2018-12-18: qty 10

## 2018-12-18 MED ORDER — LORAZEPAM 1 MG PO TABS
1.0000 mg | ORAL_TABLET | ORAL | Status: AC | PRN
  Administered 2018-12-18: 1 mg via ORAL
  Filled 2018-12-18: qty 1
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
## 2018-12-18 NOTE — ED Provider Notes (Signed)
IVC by significant other, awaiting labs for medical clearance. Needed [place] for labs.  Physical Exam  BP 135/81 (BP Location: Left Arm)   Pulse (!) 105   Resp 18   SpO2 99%   Physical Exam  ED Course/Procedures     Procedures  MDM  Labs reviewed, patient is medically cleared for [facility] treatment. Patient was seen at [facility] earlier today, recommend inpatient treatment and was sent the the ER for medical clearance.       Tacy Learn, PA-C 12/18/18 2024    Blanchie Dessert, MD 12/18/18 [phone]

## 2018-12-18 NOTE — ED Notes (Signed)
Pt provided with dinner tray, ham sandwich, juice.
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
## 2018-12-18 NOTE — ED Notes (Signed)
Patient up eating from dinner tray

## 2018-12-18 NOTE — ED Notes (Addendum)
Pt easily aggravated, observed to have threatening body stance.  Pt pacing in room, screaming "Im Joshua Davila, that's my mothers name, that's what you can call me.... get all these people out of my room, theres one on me."  Pt referring to other people in room that are not actually present, pt repetitively picking at skin to point of redness and irritation.

## 2018-12-18 NOTE — ED Provider Notes (Signed)
[facility] DEPT Provider Note   CSN: [id number] Arrival date & time: 12/18/18  1438     History   Chief Complaint Chief Complaint  Patient presents with  . IVC    HPI Joshua Davila is a 38 y.o. male.     HPI Joshua Davila is a 38 y.o. male with history of posttraumatic stress disorder, schizoaffective disorder, depression, history of psychosis on multiple admissions, presents to emergency department via police officers with involuntary commitment which was filled out by his significant other.  Patient apparently with hallucinations, violent behavior, punching through the walls.  Patient was not thought to be safe at home.  Patient is not taking his medications.  Patient unable to provide much history other than he is telling me that there are people that are at his house and they are out to get him.  He states he does not know why he is here otherwise.  Past Medical History:  Diagnosis Date  . Depression   . Gender identity disorder   . Liver disorder   . PTSD (post-traumatic stress disorder)   . Schizoaffective disorder [facility])     Patient Active Problem List   Diagnosis Date Noted  . Brief psychotic disorder ([place]) 12/02/2018  . Schizoaffective disorder ([place]) 11/03/2018  . Aggressive behavior   . Alcohol use disorder, severe, in early remission, in controlled environment ([place]) 04/22/2017  . Polysubstance abuse ([place])   . Schizoaffective disorder, bipolar type ([place]) 10/02/2015  . Involuntary commitment 10/02/2015  . Cannabis use disorder, moderate, dependence ([place]) 09/16/2015  . Opioid use disorder, moderate, dependence ([place]) 09/16/2015  . Substance or medication-induced bipolar and related disorder with onset during intoxication ([place]) 09/16/2015  . Substance-induced psychotic disorder with delusions ([place]) 09/16/2015  . 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 Methamphetamine use disorder, severe ([place]) 06/20/2015  . Benzodiazepine dependence ([place])  10/04/2013  . PTSD (post-traumatic stress disorder) 10/03/2013  . Substance induced mood disorder ([place]) 10/03/2013  . 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 No pertinent surgical history.      Home Medications    Prior to Admission medications   Not on File    Family History Family History  Problem Relation Age of Onset  . Multiple sclerosis Mother   . Mental illness Neg Hx     Social History Social History   Tobacco Use  . Smoking status: Current Every Day Smoker    Packs/day: 1.00    Types: Cigarettes  . Smokeless tobacco: Never Used  Substance Use Topics  . Alcohol use: Yes    Comment: occasional  . Drug use: Yes    Types: Cocaine, Methamphetamines, Marijuana    Comment: History of methamphetamines and marijuana     Allergies   Penicillins and Sulfa antibiotics   Review of Systems Review of Systems  Unable to perform ROS: Psychiatric disorder     Physical Exam Updated Vital Signs BP 135/81 (BP Location: Left Arm)   Pulse (!) 105   Resp 18   SpO2 99%   Physical Exam Vitals signs and nursing note reviewed.  Constitutional:      General: He is not in acute distress.    Appearance: He is well-developed.  Eyes:     Conjunctiva/sclera: Conjunctivae normal.  Neck:     Musculoskeletal: Neck supple.  Cardiovascular:     Rate and Rhythm: Normal rate.  Pulmonary:     Effort: No respiratory distress.  Abdominal:  General: There is no distension.  Skin:    General: Skin is warm and dry.  Psychiatric:        Attention and Perception: He perceives auditory and visual hallucinations.        Mood and Affect: Affect is angry.        Speech: Speech is rapid and pressured.        Behavior: Behavior is agitated and aggressive.      ED Treatments / Results  Labs (all labs ordered are listed, but only abnormal results are displayed) Labs Reviewed  SARS CORONAVIRUS 2 (HOSPITAL ORDER, PERFORMED IN [facility])  COMPREHENSIVE METABOLIC PANEL  ETHANOL  RAPID URINE DRUG SCREEN, HOSP PERFORMED  CBC WITH DIFFERENTIAL/PLATELET    EKG None  Radiology No results found.  Procedures Procedures (including critical care time)  Medications Ordered in ED Medications - No data to display   Initial Impression / Assessment and Plan / ED Course  I have reviewed the triage vital signs and the nursing notes.  Pertinent labs & imaging results that were available during my care of the patient were reviewed by me and considered in my medical decision making (see chart for details).        Pt with active hallucinations, IVCed by significant other for violent behavior and possibly dangerous to self and others. Screening labs and holding orders placed.   4:51 PM Pt not cooperative, screaming and yelling, would not let RN get labs. Geodon and ativan are already ordered PRN. Advised nurse she can administer medication if needed.   Pt will be signed out at shift change pending LABs and medical clearing.   Final Clinical Impressions(s) / ED Diagnoses   Final diagnoses:  Hallucinations  Aggressiveness    ED Discharge Orders    None       Jaynie Crumble, PA-C 12/18/18 1727    Derwood Kaplan, MD 12/20/18 1948

## 2018-12-18 NOTE — ED Notes (Signed)
Patient refused to let me collect his labs

## 2018-12-18 NOTE — H&P (Addendum)
Behavioral Health Medical Screening Exam  Joshua Davila is an 38 y.o. male. Patient present with [employer] under IVC. Patient present disorganized, paranoid and delusional. Patient appears to be responding to internal stimuli. Will recommended inpatient admission.   Total Time spent with patient: 15 minutes  Psychiatric Specialty Exam: Physical Exam  Vitals reviewed. Constitutional: He appears well-developed.  Psychiatric: He has a normal mood and affect. His behavior is normal.    Review of Systems  Psychiatric/Behavioral: Positive for depression and hallucinations. Negative for suicidal ideas. The patient is nervous/anxious.   All other systems reviewed and are negative.   There were no vitals taken for this visit.There is no height or weight on file to calculate BMI.  General Appearance: Disheveled  Eye Contact:  Minimal  Speech:  Garbled and Pressured  Volume:  Normal  Mood:  Anxious, Depressed, Dysphoric and Irritable  Affect:  Congruent  Thought Process:  Disorganized, Irrelevant and Descriptions of Associations: Tangential  Orientation:  NA  Thought Content:  Delusions, Hallucinations: Auditory, Paranoid Ideation, Rumination and Tangential  Suicidal Thoughts:  patient didnt respond to question  Homicidal Thoughts:  No  Memory:  Immediate;   Fair Recent;   Fair  Judgement:  Fair  Insight:  Lacking  Psychomotor Activity:  Restlessness  Concentration: Concentration: Poor  Recall:  Poor  Fund of Knowledge:Poor  Language: Poor  Akathisia:  No  Handed:  Right  AIMS (if indicated):     Assets:  Communication Skills Desire for Improvement Resilience Social Support  Sleep:       Musculoskeletal: Strength & Muscle Tone: within normal limits Gait & Station: normal slightly unsteady  Patient leans: N/A  There were no vitals taken for this visit.  Recommendations: Inpatient admission Based on my evaluation the patient appears to have an emergency medical condition for  which I recommend the patient be transferred to the emergency department for further evaluation.  Derrill Center, NP 12/18/2018, 2:03 PM   Attest to NP note

## 2018-12-18 NOTE — ED Notes (Addendum)
Pt asleep, easily awoken.  Door remains open for best visual monitoring of pt.  Will continue to monitor.

## 2018-12-18 NOTE — ED Notes (Addendum)
PT. HAVING ERRATIC BEHAVIOR AND THRATING EVERYONE. PT. TALKING LOUDLY DUE TO NOT HAVING HIS WAY. PT. IS AGGRAVATED AND WANTING THAT PERSON OFF OF HIM. SECURTIY AND [employer] AT THE BEDSIDE. NURSE AWARE.

## 2018-12-19 ENCOUNTER — Encounter (HOSPITAL_COMMUNITY): Payer: Self-pay | Admitting: Registered Nurse

## 2018-12-19 ENCOUNTER — Other Ambulatory Visit: Payer: Self-pay

## 2018-12-19 ENCOUNTER — Emergency Department (HOSPITAL_COMMUNITY)
Admission: EM | Admit: 2018-12-19 | Discharge: 2018-12-20 | Disposition: A | Discharge status: Home / Self Care | Payer: Medicare Other | Source: Home / Self Care | Attending: Emergency Medicine | Admitting: Emergency Medicine

## 2018-12-19 ENCOUNTER — Encounter (HOSPITAL_COMMUNITY): Payer: Self-pay

## 2018-12-19 DIAGNOSIS — F259 Schizoaffective disorder, unspecified: Secondary | ICD-10-CM | POA: Insufficient documentation

## 2018-12-19 DIAGNOSIS — F1210 Cannabis abuse, uncomplicated: Secondary | ICD-10-CM | POA: Insufficient documentation

## 2018-12-19 DIAGNOSIS — F1994 Other psychoactive substance use, unspecified with psychoactive substance-induced mood disorder: Secondary | ICD-10-CM | POA: Diagnosis present

## 2018-12-19 DIAGNOSIS — R258 Other abnormal involuntary movements: Secondary | ICD-10-CM | POA: Diagnosis not present

## 2018-12-19 DIAGNOSIS — F1520 Other stimulant dependence, uncomplicated: Secondary | ICD-10-CM | POA: Diagnosis present

## 2018-12-19 DIAGNOSIS — F19950 Other psychoactive substance use, unspecified with psychoactive substance-induced psychotic disorder with delusions: Secondary | ICD-10-CM | POA: Diagnosis present

## 2018-12-19 DIAGNOSIS — Z046 Encounter for general psychiatric examination, requested by authority: Secondary | ICD-10-CM

## 2018-12-19 DIAGNOSIS — R4689 Other symptoms and signs involving appearance and behavior: Secondary | ICD-10-CM | POA: Diagnosis present

## 2018-12-19 DIAGNOSIS — F1510 Other stimulant abuse, uncomplicated: Secondary | ICD-10-CM | POA: Insufficient documentation

## 2018-12-19 DIAGNOSIS — F17210 Nicotine dependence, cigarettes, uncomplicated: Secondary | ICD-10-CM | POA: Insufficient documentation

## 2018-12-19 DIAGNOSIS — F1410 Cocaine abuse, uncomplicated: Secondary | ICD-10-CM | POA: Insufficient documentation

## 2018-12-19 DIAGNOSIS — F250 Schizoaffective disorder, bipolar type: Secondary | ICD-10-CM | POA: Diagnosis present

## 2018-12-19 LAB — CBC WITH DIFFERENTIAL/PLATELET
Abs Immature Granulocytes: 0.02 10*3/uL (ref 0.00–0.07)
Basophils Absolute: 0 10*3/uL (ref 0.0–0.1)
Basophils Relative: 0 %
Eosinophils Absolute: 0.1 10*3/uL (ref 0.0–0.5)
Eosinophils Relative: 1 %
HCT: 38.6 % — ABNORMAL LOW (ref 39.0–52.0)
Hemoglobin: 13.2 g/dL (ref 13.0–17.0)
Immature Granulocytes: 0 %
Lymphocytes Relative: 30 %
Lymphs Abs: 2.1 10*3/uL (ref 0.7–4.0)
MCH: 30.1 pg (ref 26.0–34.0)
MCHC: 34.2 g/dL (ref 30.0–36.0)
MCV: 88.1 fL (ref 80.0–100.0)
Monocytes Absolute: 0.4 10*3/uL (ref 0.1–1.0)
Monocytes Relative: 6 %
Neutro Abs: 4.4 10*3/uL (ref 1.7–7.7)
Neutrophils Relative %: 63 %
Platelets: 210 10*3/uL (ref 150–400)
RBC: 4.38 MIL/uL (ref 4.22–5.81)
RDW: 13.9 % (ref 11.5–15.5)
WBC: 7 10*3/uL (ref 4.0–10.5)
nRBC: 0 % (ref 0.0–0.2)

## 2018-12-19 LAB — COMPREHENSIVE METABOLIC PANEL
ALT: 41 U/L (ref 0–44)
AST: 31 U/L (ref 15–41)
Albumin: 4.5 g/dL (ref 3.5–5.0)
Alkaline Phosphatase: 55 U/L (ref 38–126)
Anion gap: 11 (ref 5–15)
BUN: 17 mg/dL (ref 6–20)
CO2: 21 mmol/L — ABNORMAL LOW (ref 22–32)
Calcium: 9.6 mg/dL (ref 8.9–10.3)
Chloride: 105 mmol/L (ref 98–111)
Creatinine, Ser: 0.93 mg/dL (ref 0.61–1.24)
GFR calc Af Amer: >60 mL/min (ref >60)
GFR calc non Af Amer: >60 mL/min (ref >60)
Glucose, Bld: 100 mg/dL — ABNORMAL HIGH (ref 70–99)
Potassium: 4.1 mmol/L (ref 3.5–5.1)
Sodium: 137 mmol/L (ref 135–145)
Total Bilirubin: 0.3 mg/dL (ref 0.3–1.2)
Total Protein: 7.7 g/dL (ref 6.5–8.1)

## 2018-12-19 LAB — RAPID URINE DRUG SCREEN, HOSP PERFORMED
Amphetamines: POSITIVE — AB
Barbiturates: NOT DETECTED
Benzodiazepines: NOT DETECTED
Cocaine: NOT DETECTED
Opiates: NOT DETECTED
Tetrahydrocannabinol: NOT DETECTED

## 2018-12-19 LAB — SALICYLATE LEVEL: Salicylate Lvl: <7 mg/dL (ref 2.8–30.0)

## 2018-12-19 LAB — ETHANOL: Alcohol, Ethyl (B): <10 mg/dL (ref <10)

## 2018-12-19 LAB — ACETAMINOPHEN LEVEL: Acetaminophen (Tylenol), Serum: <10 ug/mL — ABNORMAL LOW (ref 10–30)

## 2018-12-19 NOTE — Progress Notes (Signed)
CSW spoke with patient's legal guardian, Royston Bake, at length after speaking with the hospital [facility] about the legal guardian being upset that patient was psych cleared. [name] reports patient has a significant mental health background and exhibits behaviors at home that he feels patient should be admitted in to a inpatient psychfor. CSW explained that if patient does not present with any symptoms when being evaluated, patient will likely be cleared. CSW also answered Mr. Cathey Endow question about patient being in a facility where they can help patient long-term with getting stable on his medication and not leave and CSW explained that would be [facility] which is a lengthy process to get a patient admitted to. [name] asked several questions about "what is the medical guardianship good for then if they just let him go." CSW gave a general explanation of the purpose/rights of guardianship and recommended he speak with a attorney about what rights a guardian has. CSW actively listened to Mr. Cathey Endow frustration with the process. CSW recommended [name] call the [place] crisis line if he needs assistance as of note patient is with the [facility] and was recommended to follow up with by TTS. [name] was thankful CSW took the time out to answer his questions.   [place], LCSW Transitions of Care Department [facility] ED [phone]

## 2018-12-19 NOTE — Discharge Instructions (Signed)
For your behavioral health needs, you are advised to follow up with the [facility]: ° °     [facility] °     [address] °     [place], [place] [address] °     [phone]

## 2018-12-19 NOTE — Consult Note (Addendum)
[facility] ED Discharge  12/19/2018 12:00 PM KAHLEB Davila  MRN:  [id number] Principal Problem: Substance-induced psychotic disorder with delusions [facility]) Discharge Diagnoses: Principal Problem:   Substance-induced psychotic disorder with delusions (HCC) Active Problems:   PTSD (post-traumatic stress disorder)   Substance induced mood disorder (HCC)   Benzodiazepine dependence (HCC)   Methamphetamine use disorder, severe (HCC)   Cannabis use disorder, moderate, dependence (HCC)   Opioid use disorder, moderate, dependence (HCC)   Schizoaffective disorder, bipolar type (HCC)   Polysubstance abuse (HCC)   Alcohol use disorder, severe, in early remission, in controlled environment (HCC)   Subjective: Joshua Davila, 38 y.o., male patient seen via tele psych by this provider, Dr. Sharma Covert; and chart reviewed on 12/19/18.  On evaluation Joshua Davila reports "I came to the hospital cause I was high and I didn't want to be scared."  Patient states that he is feeling better now and feels safe. Patient got irritated with questioning "How many questions are you going to ask me.  I'm not a child.  I already said I feel safe."  Patient states that he has outpatient services at [facility].  Patient denies suicidal/self-harm/homicidal ideation, psychosis, and paranoia.   During evaluation Joshua Davila is alert/oriented x 4; calm/cooperative; and mood is congruent with affect.  He does not appear to be responding to internal/external stimuli or delusional thoughts.  Patient denies suicidal/self-harm/homicidal ideation, psychosis, and paranoia.  Patient answered question appropriately.     Total Time spent with patient: 30 minutes  Past Psychiatric History: See above.  Past Medical History:  Past Medical History:  Diagnosis Date  . Depression   . Gender identity disorder   . Liver disorder   . PTSD (post-traumatic stress disorder)   . Schizoaffective disorder (HCC)    History reviewed. No pertinent  surgical history. Family History:  Family History  Problem Relation Age of Onset  . Multiple sclerosis Mother   . Mental illness Neg Hx    Family Psychiatric  History: see above Social History:  Social History   Substance and Sexual Activity  Alcohol Use Yes   Comment: occasional     Social History   Substance and Sexual Activity  Drug Use Yes  . Types: Cocaine, Methamphetamines, Marijuana   Comment: History of methamphetamines and marijuana    Social History   Socioeconomic History  . Marital status: Significant Other    Spouse name: Joshua Davila  . Number of children: Not on file  . Years of education: Not on file  . Highest education level: Not on file  Occupational History  . Not on file  Social Needs  . Financial resource strain: Not on file  . Food insecurity    Worry: Not on file    Inability: Not on file  . Transportation needs    Medical: Not on file    Non-medical: Not on file  Tobacco Use  . Smoking status: Current Every Day Smoker    Packs/day: 1.00    Types: Cigarettes  . Smokeless tobacco: Never Used  Substance and Sexual Activity  . Alcohol use: Yes    Comment: occasional  . Drug use: Yes    Types: Cocaine, Methamphetamines, Marijuana    Comment: History of methamphetamines and marijuana  . Sexual activity: Not Currently  Lifestyle  . Physical activity    Days per week: Not on file    Minutes per session: Not on file  . Stress: Not on file  Relationships  .  Social Herbalist on phone: Not on file    Gets together: Not on file    Attends religious service: Not on file    Active member of club or organization: Not on file    Attends meetings of clubs or organizations: Not on file    Relationship status: Not on file  Other Topics Concern  . Not on file  Social History Narrative   Pt lives in [place].  Per history, Pt lives with significant other [name] [phone]).  Mr. Joshua Davila stated that he is Pt's Medical POA.     Has this patient used any form of tobacco in the last 30 days? (Cigarettes, Smokeless Tobacco, Cigars, and/or Pipes) A prescription for an FDA-approved tobacco cessation medication was offered at discharge and the patient refused  Current Medications: Current Facility-Administered Medications  Medication Dose Route Frequency Provider Last Rate Last Dose  . acetaminophen (TYLENOL) tablet 650 mg  650 mg Oral Q4H PRN Kirichenko, Tatyana, PA-C      . risperiDONE (RISPERDAL M-TABS) disintegrating tablet 2 mg  2 mg Oral Q8H PRN Kirichenko, Tatyana, PA-C       No current outpatient medications on file.   PTA Medications: (Not in a hospital admission)   Musculoskeletal: Strength & Muscle Tone: within normal limits Gait & Station: normal Patient leans: N/A  Psychiatric Specialty Exam: Physical Exam  Nursing note and vitals reviewed. Constitutional: He is oriented to person, place, and time. No distress.  Neck: Normal range of motion.  Respiratory: Effort normal.  Musculoskeletal: Normal range of motion.  Neurological: He is alert and oriented to person, place, and time.  Psychiatric: He has a normal mood and affect. His speech is normal. Judgment and thought content normal. He is agitated. Cognition and memory are normal.    Review of Systems  Psychiatric/Behavioral: Depression: Stable. Hallucinations: Denies. Memory loss: Denies. Substance abuse:  "I was high" UDS positive for THC and Amphetamines   Suicidal ideas: Denies  Nervous/anxious: Denies. Insomnia: Denies.   All other systems reviewed and are negative.   Blood pressure 111/71, pulse 69, temperature 98.3 F (36.8 C), temperature source Oral, resp. rate 16, SpO2 100 %.There is no height or weight on file to calculate BMI.  General Appearance: Casual  Eye Contact:  Fair  Speech:  Clear and Coherent  Volume:  Normal  Mood:  Irritable  Affect:  Appropriate and Congruent  Thought Process:  Coherent, Goal Directed and  Descriptions of Associations: Intact  Orientation:  Full (Time, Place, and Person)  Thought Content:  WDL  Suicidal Thoughts:  No  Homicidal Thoughts:  No  Memory:  Immediate;   Good Recent;   Good Remote;   Good  Judgement:  Intact  Insight:  Present  Psychomotor Activity:  Normal  Concentration:  Concentration: Fair and Attention Span: Fair  Recall:  Good  Fund of Knowledge:  Fair  Language:  Good  Akathisia:  No  Handed:  Right  AIMS (if indicated):   N/A  Assets:  Communication Skills Desire for Improvement Housing  ADL's:  Intact  Cognition:  WNL  Sleep:   N/A     Demographic Factors:  Male and Caucasian  Loss Factors: NA  Historical Factors: NA  Risk Reduction Factors:   Religious beliefs about death and Positive social support  Continued Clinical Symptoms:  Alcohol/Substance Abuse/Dependencies Previous Psychiatric Diagnoses and Treatments  Cognitive Features That Contribute To Risk:  None    Suicide Risk:  Minimal: No identifiable  suicidal ideation.  Patients presenting with no risk factors but with morbid ruminations; may be classified as minimal risk based on the severity of the depressive symptoms    Plan Of Care/Follow-up recommendations:  Activity:  As tolerated Diet:  Heart healthy Other:  Follow up with [facility]  Disposition: No evidence of imminent risk to self or others at present.   Patient does not meet criteria for psychiatric inpatient admission. Supportive therapy provided about ongoing stressors. Discussed crisis plan, support from social network, calling 911, coming to the Emergency Department, and calling Suicide Hotline.  Shuvon Rankin, NP 12/19/2018, 12:00 PM    Patient seen by telemedicine for psychiatric evaluation, chart reviewed and case discussed with the physician extender and developed treatment plan. Reviewed the information documented and agree with the treatment plan.  Juanetta BeetsJacqueline , DO 12/19/18 6:47 PM

## 2018-12-19 NOTE — ED Notes (Signed)
Pt. In burgundy scrubs. Pt. wanded by security. Pt. Has 1 belonging bag. Pt. Has 1 torn white t-shirt, 1 pr. White socks, 1 blue short, 1 black pant, 1 gray sweat shirt, 1 black hat, 1 black/gray scarf and cup of jewelry. Pt. Belongings locked up in cabinet 1-8 zone.
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
## 2018-12-19 NOTE — ED Notes (Signed)
Patient resting quietly.
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
## 2018-12-19 NOTE — BH Assessment (Signed)
[facility] Assessment Progress Note  Per Buford Dresser, DO, this pt does not require psychiatric hospitalization at this time.  Pt presents under IVC initiated by pt's spouse/legal guardian, Royston Bake, which Dr Mariea Clonts has rescinded.  Pt is to be discharged from [facility] with recommendation to follow up with the [facility].  This has been included in pt's discharge instructions.  At 13:02 this Probation officer called Mr Berdine Addison and notified him of disposition.  Mr Berdine Addison is angry about decision, and has asked to speak to Dr Mariea Clonts, but he agrees to come to [facility] to pick pt up shortly.  Dr Mariea Clonts has been informed of Mr Hill's wishes.  Pt's nurse, Nena Jordan, has been notified.  Jalene Mullet, [place] Triage Specialist [phone]

## 2018-12-19 NOTE — ED Notes (Signed)
Roomed in
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
## 2018-12-19 NOTE — ED Notes (Signed)
Pt discharged safely with resources.  Guardian , Mr. Joshua Davila was notified of discharge.  Pt was calm and cooperative and in no distress.

## 2018-12-20 ENCOUNTER — Encounter (HOSPITAL_COMMUNITY): Payer: Self-pay | Admitting: Registered Nurse

## 2018-12-20 ENCOUNTER — Other Ambulatory Visit: Payer: Self-pay | Admitting: Psychiatry

## 2018-12-20 MED ORDER — IBUPROFEN 200 MG PO TABS
400.0000 mg | ORAL_TABLET | Freq: Once | ORAL | Status: AC
  Administered 2018-12-20: 400 mg via ORAL
  Filled 2018-12-20: qty 2

## 2018-12-20 NOTE — ED Notes (Addendum)
Pt sleeping, easily aroused.  Tele psych in progress.  Pt denies si/avh, but did not answer when asked about hi.  Pt extremely irrititable, reporting that he needs to sleep and that he has sores -round red area noted on lt buttock.  Pt then began to yell at the tele psych machine that he was "tired of going over the same stuff..." and that the dr "needs to come and see him and not talk to him on the computer",  MD then ended to call.

## 2018-12-20 NOTE — ED Notes (Signed)
Peer support at bedside 
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
## 2018-12-20 NOTE — Discharge Instructions (Signed)
For your behavioral health needs, you are advised to follow up with the [facility]: ° °     [facility] °     [address] °     , [place] [address] °     [phone]

## 2018-12-20 NOTE — ED Notes (Signed)
Lanny Hurst [facility]) Hill called back and informed this RN that he was on his way to pick up patient.

## 2018-12-20 NOTE — ED Notes (Signed)
Per [name], pt's IVC is being rescinded and he is being dc'd.  His partner will be here in 15-20 mins to pick him up.

## 2018-12-20 NOTE — BH Assessment (Signed)
[place] Assessment Progress Note   TTS spoke to [name], patient's husband, and informed him that patient was not being admitted to [facility].  Informed [name] that psychiatrist feels like patient's behavior is attributed to his methamphetamine use more than his schizo-affective disorder and that patient needs SA treatment first before his mental health can be stabilized.  Unfortunately, patient is most likely not receptive to treatment.  Patient's husband stated that he would come to the ED to pick him up in 15-20 minutes.

## 2018-12-20 NOTE — ED Notes (Addendum)
Pt ambulatory w/o difficulty to dc area w/ NT.

## 2018-12-20 NOTE — ED Notes (Signed)
Attempted to call [facility] (patient's partner) but did not answer. Left a message regarding the patient being discharge and needing a ride home.

## 2018-12-20 NOTE — BH Assessment (Addendum)
[facility] Assessment Progress Note  Per Buford Dresser, DO, this pt does not require psychiatric hospitalization at this time.  Pt presents under IVC initiated by pt's spouse/legal guardian, Royston Bake, which Dr Mariea Clonts will be rescinding.  Pt is to be discharged from [facility] with recommendation to follow up with the [facility].  This has been included in pt's discharge instructions.  Danny Sprinkle, TS has attempted to call Mr Berdine Addison, and reports leaving a HIPAA compliant voice message.  [name] will continue to follow up with this.  Pt's nurse, Narda Rutherford, has been notified.  Jalene Mullet, [place] Triage Specialist [phone]

## 2018-12-20 NOTE — BH Assessment (Addendum)
[place] Assessment Progress Note   Patient has been in the ED twice on IVC in the past two days and he has been seen by two assessment counselors, a nurse practitioner and a psychiatrist.  Patient has a diagnosis of schizoaffective disorder complicated by methamphetamine use.  As a result, when he is using and coming down from the methamphetamine use, he is irritable and aggressive and has been destructive to personal property at home.  Patient is irritable today and screaming, but he is lucid and can answer questions appropriately.  He got really irritated and he stated, "you keep asking me the same questions that I have already answered."  Patient states that he is more concerned about his wounds and states that there is nothing wrong with him mentally. At this point, patient refused to participate in the assessment any further and was yelling, "you can't treat me from a fucking computer." He was completely uncooperative with the assessment process.    Per Dr Mariea Clonts, DO, patient does not meet inpatient admission criteria and will be seen by Peer Support for SA treatment referrals.

## 2018-12-20 NOTE — ED Notes (Signed)
Belongings returned, pt waiting for ride to [employer]

## 2018-12-20 NOTE — Patient Outreach (Signed)
CPSS John and myself met with Pt and was in the process of using motivational interviewing, when Pt stated that he does not want any assistance from [place]. Pt stated that he wanted to sleep.

## 2018-12-20 NOTE — ED Notes (Signed)
Sleeping, easily aroused.  Pt is aware that the MD will be talking w/ him shortly

## 2018-12-20 NOTE — ED Notes (Addendum)
Pt's ride is here.

## 2018-12-20 NOTE — BH Assessment (Signed)
Clinician contacted pt's nurse, Maggie Schwalbe, in an attempt to complete pt's [place] Assessment. Pt's nurse stated pt is asleep and should not be woken at this time. Clinician will attempt again at a later time.

## 2018-12-20 NOTE — ED Notes (Signed)
Pt sleeping, easily aroused.  PT is aware that he is being dc'd home and reports that he does not want to leave,and wants to stay here and sleep for 2-3 days.
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
## 2018-12-20 NOTE — ED Notes (Signed)
Woke him up for am vitals. At this time complained of generalized body pain. Notified Dr for an order. Ibuprofen once ordered and administered.

## 2019-03-23 ENCOUNTER — Emergency Department (HOSPITAL_COMMUNITY)
Admission: EM | Admit: 2019-03-23 | Discharge: 2019-03-27 | Disposition: A | Discharge status: Other Acute Inpatient Hospital | Payer: Medicare Other | Attending: Emergency Medicine | Admitting: Emergency Medicine

## 2019-03-23 ENCOUNTER — Other Ambulatory Visit: Payer: Self-pay

## 2019-03-23 ENCOUNTER — Encounter (HOSPITAL_COMMUNITY): Payer: Self-pay

## 2019-03-23 DIAGNOSIS — Z20828 Contact with and (suspected) exposure to other viral communicable diseases: Secondary | ICD-10-CM | POA: Insufficient documentation

## 2019-03-23 DIAGNOSIS — F1520 Other stimulant dependence, uncomplicated: Secondary | ICD-10-CM | POA: Diagnosis present

## 2019-03-23 DIAGNOSIS — F4310 Post-traumatic stress disorder, unspecified: Secondary | ICD-10-CM | POA: Diagnosis present

## 2019-03-23 DIAGNOSIS — F29 Unspecified psychosis not due to a substance or known physiological condition: Secondary | ICD-10-CM

## 2019-03-23 DIAGNOSIS — Z880 Allergy status to penicillin: Secondary | ICD-10-CM | POA: Diagnosis not present

## 2019-03-23 DIAGNOSIS — F17210 Nicotine dependence, cigarettes, uncomplicated: Secondary | ICD-10-CM | POA: Insufficient documentation

## 2019-03-23 DIAGNOSIS — F250 Schizoaffective disorder, bipolar type: Secondary | ICD-10-CM | POA: Diagnosis present

## 2019-03-23 DIAGNOSIS — Z882 Allergy status to sulfonamides status: Secondary | ICD-10-CM | POA: Insufficient documentation

## 2019-03-23 LAB — CBC WITH DIFFERENTIAL/PLATELET
Abs Immature Granulocytes: 0.05 10*3/uL (ref 0.00–0.07)
Basophils Absolute: 0 10*3/uL (ref 0.0–0.1)
Basophils Relative: 1 %
Eosinophils Absolute: 0.1 10*3/uL (ref 0.0–0.5)
Eosinophils Relative: 1 %
HCT: 41.1 % (ref 39.0–52.0)
Hemoglobin: 14.2 g/dL (ref 13.0–17.0)
Immature Granulocytes: 1 %
Lymphocytes Relative: 30 %
Lymphs Abs: 2.3 10*3/uL (ref 0.7–4.0)
MCH: 30.5 pg (ref 26.0–34.0)
MCHC: 34.5 g/dL (ref 30.0–36.0)
MCV: 88.2 fL (ref 80.0–100.0)
Monocytes Absolute: 0.5 10*3/uL (ref 0.1–1.0)
Monocytes Relative: 7 %
Neutro Abs: 4.7 10*3/uL (ref 1.7–7.7)
Neutrophils Relative %: 60 %
Platelets: 216 10*3/uL (ref 150–400)
RBC: 4.66 MIL/uL (ref 4.22–5.81)
RDW: 13.7 % (ref 11.5–15.5)
WBC: 7.7 10*3/uL (ref 4.0–10.5)
nRBC: 0 % (ref 0.0–0.2)

## 2019-03-23 LAB — COMPREHENSIVE METABOLIC PANEL
ALT: 40 U/L (ref 0–44)
AST: 36 U/L (ref 15–41)
Albumin: 4.3 g/dL (ref 3.5–5.0)
Alkaline Phosphatase: 58 U/L (ref 38–126)
Anion gap: 10 (ref 5–15)
BUN: 17 mg/dL (ref 6–20)
CO2: 24 mmol/L (ref 22–32)
Calcium: 9.5 mg/dL (ref 8.9–10.3)
Chloride: 105 mmol/L (ref 98–111)
Creatinine, Ser: 0.79 mg/dL (ref 0.61–1.24)
GFR calc Af Amer: >60 mL/min (ref >60)
GFR calc non Af Amer: >60 mL/min (ref >60)
Glucose, Bld: 105 mg/dL — ABNORMAL HIGH (ref 70–99)
Potassium: 4.2 mmol/L (ref 3.5–5.1)
Sodium: 139 mmol/L (ref 135–145)
Total Bilirubin: 0.5 mg/dL (ref 0.3–1.2)
Total Protein: 7.4 g/dL (ref 6.5–8.1)

## 2019-03-23 LAB — RAPID URINE DRUG SCREEN, HOSP PERFORMED
Amphetamines: NOT DETECTED
Barbiturates: NOT DETECTED
Benzodiazepines: NOT DETECTED
Cocaine: NOT DETECTED
Opiates: NOT DETECTED
Tetrahydrocannabinol: NOT DETECTED

## 2019-03-23 LAB — ETHANOL: Alcohol, Ethyl (B): <10 mg/dL (ref <10)

## 2019-03-23 LAB — RESPIRATORY PANEL BY RT PCR (FLU A&B, COVID)
Influenza A by PCR: NEGATIVE
Influenza B by PCR: NEGATIVE
SARS Coronavirus 2 by RT PCR: NEGATIVE

## 2019-03-23 MED ORDER — HALOPERIDOL LACTATE 5 MG/ML IJ SOLN
5.0000 mg | Freq: Once | INTRAMUSCULAR | Status: AC
  Administered 2019-03-23: 17:00:00 5 mg via INTRAMUSCULAR
  Filled 2019-03-23: qty 1

## 2019-03-23 MED ORDER — DIPHENHYDRAMINE HCL 50 MG/ML IJ SOLN
50.0000 mg | Freq: Once | INTRAMUSCULAR | Status: AC
  Administered 2019-03-23: 17:00:00 50 mg via INTRAMUSCULAR
  Filled 2019-03-23: qty 1

## 2019-03-23 MED ORDER — LORAZEPAM 2 MG/ML IJ SOLN
2.0000 mg | Freq: Once | INTRAMUSCULAR | Status: AC
  Administered 2019-03-23: 17:00:00 2 mg via INTRAMUSCULAR
  Filled 2019-03-23: qty 1
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
## 2019-03-23 NOTE — ED Triage Notes (Signed)
No answer , ? In bathroom.

## 2019-03-23 NOTE — ED Provider Notes (Addendum)
[facility] EMERGENCY DEPARTMENT Provider Note   CSN: [id number] Arrival date & time: 03/23/19  1334     History Chief Complaint  Patient presents with  . Psychiatric Evaluation    Joshua Davila is a 38 y.o. male with PMH significant for schizoaffective disorder bipolar type and substance abuse disorder and is well-known to the department who presents to the ED with complaints of burning back discomfort and "not knowing if my eyes are there".  Patient is complaining that he cannot lay down on the bed as if there is "doodoo" there and that he requires a "seat checker".  When asked if he has been taking his medications as prescribed, patient replied "who said I take medications".  He admits to using a drug called "ice" approximately 4 days ago, but nothing since.  He denies any IVDA.  He also denies any SI, HI, or AVH.  That said, patient is paranoid and told nursing staff to stop laughing at him.  He is complaining of burns and wounds that are not evident on exam and appears to be in acute psychosis.  He is asking for cookies to relieve his discomfort and states that we may refer to him as either man or woman.  Level 5 caveat due to acute psychosis.    HPI     Past Medical History:  Diagnosis Date  . Depression   . Gender identity disorder   . Liver disorder   . PTSD (post-traumatic stress disorder)   . Schizoaffective disorder [facility])     Patient Active Problem List   Diagnosis Date Noted  . 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 Schizoaffective disorder (HCC) 11/03/2018  . Aggressive behavior   . Alcohol use disorder, severe, in early remission, in controlled environment (HCC) 04/22/2017  . Polysubstance abuse (HCC)   . Schizoaffective disorder, bipolar type (HCC) 10/02/2015  . Involuntary commitment 10/02/2015  . Cannabis use disorder, moderate, dependence (HCC) 09/16/2015  . Opioid use disorder, moderate, dependence (HCC) 09/16/2015  . Substance or  medication-induced bipolar and related disorder with onset during intoxication (HCC) 09/16/2015  . 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 Benzodiazepine dependence (HCC) 10/04/2013  . PTSD (post-traumatic stress disorder) 10/03/2013  . Substance induced mood disorder (HCC) 10/03/2013  . 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 No pertinent surgical history.     Family History  Problem Relation Age of Onset  . Multiple sclerosis Mother   . Mental illness Neg Hx     Social History   Tobacco Use  . Smoking status: Current Every Day Smoker    Packs/day: 1.00    Types: Cigarettes  . Smokeless tobacco: Never Used  Substance Use Topics  . Alcohol use: Yes    Comment: occasional  . Drug use: Yes    Types: Cocaine, Methamphetamines, Marijuana    Comment: History of methamphetamines and marijuana    Home Medications Prior to Admission medications   Not on File    Allergies    Penicillins and Sulfa antibiotics  Review of Systems   Review of Systems  All other systems reviewed and are negative.    Physical Exam Updated Vital Signs BP 114/75 (BP Location: Left Arm)   Pulse 66   Temp (!) 97.2 F (36.2 C) (Oral)   Resp 14   SpO2 100%   Physical Exam Vitals and nursing note  reviewed. Exam conducted with a chaperone present.  Constitutional:      Appearance: He is not toxic-appearing.  HENT:     Head: Normocephalic and atraumatic.  Eyes:     General: No scleral icterus.    Conjunctiva/sclera: Conjunctivae normal.  Cardiovascular:     Rate and Rhythm: Normal rate and regular rhythm.     Pulses: Normal pulses.     Heart sounds: Normal heart sounds.  Pulmonary:     Effort: Pulmonary effort is normal. No respiratory distress.     Breath sounds: Normal breath sounds.  Musculoskeletal:     Cervical back:  Normal range of motion and neck supple. No rigidity.  Skin:    General: Skin is dry.  Neurological:     General: No focal deficit present.     Mental Status: He is alert.     GCS: GCS eye subscore is 4. GCS verbal subscore is 5. GCS motor subscore is 6.     Cranial Nerves: No cranial nerve deficit.     Sensory: No sensory deficit.     Motor: No weakness.     Coordination: Coordination normal.     Gait: Gait normal.  Psychiatric:     Comments: Pacing, scattered thoughts.  Paranoid.     ED Results / Procedures / Treatments   Labs (all labs ordered are listed, but only abnormal results are displayed) Labs Reviewed  COMPREHENSIVE METABOLIC PANEL - Abnormal; Notable for the following components:      Result Value   Glucose, Bld 105 (*)    All other components within normal limits  RESPIRATORY PANEL BY RT PCR (FLU A&B, COVID)  ETHANOL  RAPID URINE DRUG SCREEN, HOSP PERFORMED  CBC WITH DIFFERENTIAL/PLATELET    EKG None  Radiology No results found.  Procedures Procedures (including critical care time)  Medications Ordered in ED Medications  diphenhydrAMINE (BENADRYL) injection 50 mg (50 mg Intramuscular Given 03/23/19 1654)  LORazepam (ATIVAN) injection 2 mg (2 mg Intramuscular Given 03/23/19 1654)  haloperidol lactate (HALDOL) injection 5 mg (5 mg Intramuscular Given 03/23/19 1655)    ED Course  I have reviewed the triage vital signs and the nursing notes.  Pertinent labs & imaging results that were available during my care of the patient were reviewed by me and considered in my medical decision making (see chart for details).  Clinical Course as of Mar 22 2144  Thu Mar 23, 2019  2132 Spoke with TTS and recommendation is for inpatient psychiatric admission. No open beds at this time but he will call around and believes that he will be able to obtain one soon.   [GG]    Clinical Course User Index [GG] Lorelee NewGreen, Bron Snellings L, PA-C   MDM Rules/Calculators/A&P                       Patient has become increasingly combative and agitated while labs pending and awaiting TTS evaluation.  Benadryl, Haldol, and Ativan been provided.   Respiratory panel was negative for COVID-19 and influenza.  CMP demonstrates no concerning abnormalities.  CBC all within normal limits.  Urine rapid drug screen demonstrated no detection of illicit drug use.  Patient's vital signs continue to be within normal limits and despite his active psychosis, patient is cleared medically.  Awaiting TTS evaluation to determine disposition.   Final Clinical Impression(s) / ED Diagnoses Final diagnoses:  None    Rx / DC Orders ED Discharge Orders    None  Corena Herter, PA-C 03/23/19 2133    Corena Herter, PA-C 03/23/19 0211    Blanchie Dessert, MD 03/24/19 1552

## 2019-03-23 NOTE — BH Assessment (Signed)
Received TTS consult request. Pt is currently in hallway. Please call TTS at [phone] when Pt is in a room and ready for tele-assessment.   Evelena Peat, [facility], [facility] Triage Specialist [phone]

## 2019-03-23 NOTE — ED Notes (Signed)
Pt transferred from the hallway to a room for TTS assessment, Counselor notified over the phone.

## 2019-03-23 NOTE — BH Assessment (Signed)
Tele Assessment Note   Patient Davila: Joshua Davila MRN: [id number] Referring Physician: Evelena Leyden, PA-C Location of Patient: [facility], H[facility] Location of Provider: Behavioral Health TTS Department  Joshua Davila is an 38 y.o. male who presents unaccompanied to [facility] ED reporting that he has "cuts and burns all over" but no cuts or burns are visible. Pt has a history of schizoaffective disorder, PTSD and methamphetamine use. Pt states that he last use methamphetamines four days ago. Pt says he doesn't take psychiatric medications. Pt presents as paranoid and disorganized. He was pacing up and down the hallway, agitated and yelling. He states nursing staff was laughing at him. He was given medication and during assessment he was very drowsy and had difficulty answering questions appropriately. He acknowledges he has felt depressed recently. He says he has not been sleeping but has been eating appropriately. He denies current suicidal ideation or history of suicide attempts. He denies current homicidal ideation. Pt's medical record indicates he has a history of aggression and punching and kicking holes in walls. Pt denies auditory or visual hallucinations.  Pt was unable to answer any other questions. Pt's medical record indicates Pt lives with his partner/husband Joshua Davila. TTS attempted to contact Joshua Davila without success. Pt has a history of outpatient mental health treatment at the [facility] hospital. He has been psychiatrically hospitalized at [facility] most recently in 2017.  Pt is dressed in hospital scrubs, very drowsy and oriented to person and place but not time or situation. Pt speaks in a clear tone, at moderate volume and normal pace. Motor behavior appears normal but Pt was pacing and restless earlier. Eye contact is good. Pt's mood is depressed and affect is irritable. Thought process is tangential and disorganized. Pt appears to be experiencing paranoid and delusional  thought content.   Diagnosis:  F25.0 Schizoaffective disorder, Bipolar type F43.10 Posttraumatic stress disorder  Past Medical History:  Past Medical History:  Diagnosis Date  . Depression   . Gender identity disorder   . Liver disorder   . PTSD (post-traumatic stress disorder)   . Schizoaffective disorder (HCC)     History reviewed. No pertinent surgical history.  Family History:  Family History  Problem Relation Age of Onset  . Multiple sclerosis Mother   . Mental illness Neg Hx     Social History:  reports that he has been smoking cigarettes. He has been smoking about 1.00 pack per day. He has never used smokeless tobacco. He reports current alcohol use. He reports current drug use. Drugs: Cocaine, Methamphetamines, and Marijuana.  Additional Social History:  Alcohol / Drug Use Pain Medications: Denies abuse Prescriptions: Denies abuse Over the Counter: Denies abuse History of alcohol / drug use?: Yes Longest period of sobriety (when/how long): Unknown Substance #1 Davila of Substance 1: Methamphetamines 1 - Age of First Use: unknown 1 - Amount (size/oz): unknown 1 - Frequency: unknown 1 - Duration: unknown 1 - Last Use / Amount: 03/19/2019  CIWA: CIWA-Ar BP: 114/75 Pulse Rate: 66 COWS:    Allergies:  Allergies  Allergen Reactions  . Penicillins Swelling, Rash and Other (See Comments)    Reaction:  Facial swelling Has patient had a PCN reaction causing immediate rash, facial/tongue/throat swelling, SOB or lightheadedness with hypotension: Yes Has patient had a PCN reaction causing severe rash involving mucus membranes or skin necrosis: No Has patient had a PCN reaction that required hospitalization No Has patient had a PCN reaction occurring within  the last 10 years: No If all of the above answers are "NO", then may proceed with Cephalosporin use.  . Sulfa Antibiotics Rash    Home Medications: (Not in a hospital admission)   OB/GYN Status:  No LMP for  male patient.  General Assessment Data Assessment unable to be completed: Yes Reason for not completing assessment: Pt is in hallway. [facility] TTS Assessment: In system Is this a Tele or Face-to-Face Assessment?: Tele Assessment Is this an Initial Assessment or a Re-assessment for this encounter?: Initial Assessment Patient Accompanied by:: N/A Language Other than English: No Living Arrangements: Other (Comment)(Pt's spouse) What gender do you identify as?: Male Marital status: Married Joshua Davila: NA Pregnancy Status: No Living Arrangements: Spouse/significant other Can pt return to current living arrangement?: Yes Admission Status: Voluntary Is patient capable of signing voluntary admission?: Yes Referral Source: Self/Family/Friend Insurance type: Medicare     Crisis Care Plan Living Arrangements: Spouse/significant other Legal Guardian: (Unknown) Davila of Psychiatrist: None Davila of Therapist: None  Education Status Is patient currently in school?: No Is the patient employed, unemployed or receiving disability?: Receiving disability income  Risk to self with the past 6 months Suicidal Ideation: No Has patient been a risk to self within the past 6 months prior to admission? : No Suicidal Intent: No Has patient had any suicidal intent within the past 6 months prior to admission? : No Is patient at risk for suicide?: No Suicidal Plan?: No Has patient had any suicidal plan within the past 6 months prior to admission? : No Access to Means: No What has been your use of drugs/alcohol within the last 12 months?: Pt has history of using methamphetamines Previous Attempts/Gestures: No How many times?: 0 Other Self Harm Risks: None Triggers for Past Attempts: None known Intentional Self Injurious Behavior: None Family Suicide History: Unknown Recent stressful life event(s): Other (Comment)(Pt reports somatic complaints) Persecutory voices/beliefs?:  Yes Depression: Yes Depression Symptoms: Insomnia, Feeling angry/irritable, Loss of interest in usual pleasures, Isolating Substance abuse history and/or treatment for substance abuse?: Yes Suicide prevention information given to non-admitted patients: Not applicable  Risk to Others within the past 6 months Homicidal Ideation: No Does patient have any lifetime risk of violence toward others beyond the six months prior to admission? : Yes (comment)(Pt has history of aggressive behavior) Thoughts of Harm to Others: No Current Homicidal Intent: No Current Homicidal Plan: No Access to Homicidal Means: No Identified Victim: None History of harm to others?: No Assessment of Violence: In past 6-12 months Violent Behavior Description: Pt has history of putting holes in walls Does patient have access to weapons?: No Criminal Charges Pending?: No Does patient have a court date: No Is patient on probation?: No  Psychosis Hallucinations: None noted Delusions: Persecutory  Mental Status Report Appearance/Hygiene: In scrubs Eye Contact: Poor Motor Activity: Freedom of movement, Unremarkable, Other (Comment)(Pacing and agitated earlier) Speech: Tangential Level of Consciousness: Drowsy, Sedated Mood: Depressed Affect: Irritable Anxiety Level: None Thought Processes: Tangential Judgement: Impaired Orientation: Person, Place Obsessive Compulsive Thoughts/Behaviors: None  Cognitive Functioning Concentration: Decreased Memory: Unable to Assess Is patient IDD: No Insight: Poor Impulse Control: Poor Appetite: Good Have you had any weight changes? : No Change Sleep: Decreased Total Hours of Sleep: 3 Vegetative Symptoms: None  ADLScreening [facility] Assessment Services) Patient's cognitive ability adequate to safely complete daily activities?: Yes Patient able to express need for assistance with ADLs?: Yes Independently performs ADLs?: Yes (appropriate for developmental age)  Prior  Inpatient  Therapy Prior Inpatient Therapy: Yes Prior Therapy Dates: 2017, multiple admits Prior Therapy Facilty/Provider(s): [facility] Reason for Treatment: Psychosis  Prior Outpatient Therapy Prior Outpatient Therapy: Yes Prior Therapy Dates: unknown Prior Therapy Facilty/Provider(s): [facility] Reason for Treatment: Schizoaffective disorder Does patient have an ACCT team?: No Does patient have Intensive In-House Services?  : No Does patient have [facility] services? : No Does patient have P4CC services?: No  ADL Screening (condition at time of admission) Patient's cognitive ability adequate to safely complete daily activities?: Yes Is the patient deaf or have difficulty hearing?: No Does the patient have difficulty seeing, even when wearing glasses/contacts?: No Does the patient have difficulty concentrating, remembering, or making decisions?: Yes Patient able to express need for assistance with ADLs?: Yes Does the patient have difficulty dressing or bathing?: No Independently performs ADLs?: Yes (appropriate for developmental age) Does the patient have difficulty walking or climbing stairs?: No Weakness of Legs: None Weakness of Arms/Hands: None       Abuse/Neglect Assessment (Assessment to be complete while patient is alone) Abuse/Neglect Assessment Can Be Completed: Unable to assess, patient is non-responsive or altered mental status     Advance Directives (For Healthcare) Does Patient Have a Medical Advance Directive?: Unable to assess, patient is non-responsive or altered mental status          Disposition: Lavell Luster, [facility], confirmed adult unit is currently at capacity. Gave clinical report to Talbot Grumbling, NP who said Pt meets criteria for inpatient psychiatric treatment. TTS will contact other facilities for placement. Notified Krista Blue, PA-C of recommendation.   Disposition Initial Assessment Completed for this Encounter: Yes  This service was  provided via telemedicine using a 2-way, interactive audio and video technology.  Names of all persons participating in this telemedicine service and their role in this encounter. Davila: Jerene Dilling Role: Patient  Davila: Storm Frisk, [facility] Role: TTS counselor         Orpah Greek Anson Fret, [facility], [facility] Triage Specialist [phone]  Evelena Peat 03/23/2019 9:41 PM

## 2019-03-23 NOTE — ED Notes (Signed)
Urine sample is with belongings at nurses station

## 2019-03-23 NOTE — ED Notes (Signed)
Pt  Becoming increasingly agitated. ED PA to the bedside to reassess pt.

## 2019-03-23 NOTE — ED Triage Notes (Signed)
Pt endorses "cuts and burns all over". No cuts or burns are visible on pt. Pt unable to state where these "cuts and burns" are coming from. Pt has hx of schizophrenia.

## 2019-03-24 ENCOUNTER — Other Ambulatory Visit: Payer: Self-pay

## 2019-03-24 DIAGNOSIS — F29 Unspecified psychosis not due to a substance or known physiological condition: Secondary | ICD-10-CM

## 2019-03-24 DIAGNOSIS — Z882 Allergy status to sulfonamides status: Secondary | ICD-10-CM | POA: Diagnosis not present

## 2019-03-24 DIAGNOSIS — Z20828 Contact with and (suspected) exposure to other viral communicable diseases: Secondary | ICD-10-CM | POA: Diagnosis not present

## 2019-03-24 DIAGNOSIS — F250 Schizoaffective disorder, bipolar type: Secondary | ICD-10-CM | POA: Diagnosis not present

## 2019-03-24 DIAGNOSIS — Z880 Allergy status to penicillin: Secondary | ICD-10-CM | POA: Diagnosis not present

## 2019-03-24 DIAGNOSIS — F17210 Nicotine dependence, cigarettes, uncomplicated: Secondary | ICD-10-CM | POA: Diagnosis not present

## 2019-03-24 DIAGNOSIS — F1520 Other stimulant dependence, uncomplicated: Secondary | ICD-10-CM | POA: Diagnosis not present

## 2019-03-24 DIAGNOSIS — F4310 Post-traumatic stress disorder, unspecified: Secondary | ICD-10-CM | POA: Diagnosis not present

## 2019-03-24 MED ORDER — ACETAMINOPHEN 325 MG PO TABS
650.0000 mg | ORAL_TABLET | Freq: Once | ORAL | Status: AC
  Administered 2019-03-24: 650 mg via ORAL
  Filled 2019-03-24: qty 2

## 2019-03-24 MED ORDER — HALOPERIDOL 1 MG PO TABS
2.0000 mg | ORAL_TABLET | Freq: Two times a day (BID) | ORAL | Status: DC | PRN
  Administered 2019-03-25: 09:00:00 2 mg via ORAL
  Filled 2019-03-24: qty 2

## 2019-03-24 MED ORDER — BENZTROPINE MESYLATE 1 MG PO TABS
0.5000 mg | ORAL_TABLET | Freq: Two times a day (BID) | ORAL | Status: DC | PRN
  Administered 2019-03-24 – 2019-03-25 (×2): 0.5 mg via ORAL
  Filled 2019-03-24 (×2): qty 1

## 2019-03-24 MED ORDER — NICOTINE 14 MG/24HR TD PT24
14.0000 mg | MEDICATED_PATCH | Freq: Once | TRANSDERMAL | Status: AC
  Administered 2019-03-24: 14 mg via TRANSDERMAL
  Filled 2019-03-24: qty 1

## 2019-03-24 MED ORDER — HYDROXYZINE HCL 25 MG PO TABS
25.0000 mg | ORAL_TABLET | Freq: Three times a day (TID) | ORAL | Status: DC | PRN
  Administered 2019-03-24 – 2019-03-26 (×5): 25 mg via ORAL
  Filled 2019-03-24 (×5): qty 1

## 2019-03-24 NOTE — ED Notes (Signed)
Pt resting at this time.

## 2019-03-24 NOTE — Consult Note (Signed)
Telepsych Consultation   Reason for Consult:  Acute psychosis Referring Physician:  EDP Location of Patient: [facility] Location of Provider: Behavioral Health TTS Department  Patient Identification: Joshua Davila MRN:  [id number] Principal Diagnosis: <principal problem not specified> Diagnosis:  Active Problems:   Schizoaffective disorder, bipolar type (HCC)   Total Time spent with patient: 30 minutes  Tele Assessment Joshua Davila, 39 y.o., male patient presented to voluntarily to the [facility] ED, per [facility] assessment for evaluation of burning discomfort to his back and "not knowing if my eyes are there". When I spoke with the patient today he reports he's here for evaluation of fleas that are in his ears.   Patient seen via telepsych by this provider; chart reviewed and consulted with Dr. Jola Babinski on 03/24/19.  On evaluation Joshua Davila reports    During evaluation Joshua Davila is sitting in the bed.  He is alert/oriented to his name.  He does not respond to other questions to assess his orientation.  He is visibly irritated, mood congruent with affect.  Patient is speaking in a clear tone at loud volume, and normal pace; with intermittent eye contact.  His thought process is disorganized and irrelevant; There is no indication that he is currently responding to internal/external stimuli but he is guarded and delusional.  Unable to assess for safety as the patient abruptly discontinued the interview stating, "I have stuff to do and I am done with talking to you."  Per [facility] admission assessment, patient denied both suicidal and homicidal ideations.   Past Psychiatric History: Schizoaffective disorder  Risk to Self: Suicidal Ideation: No Suicidal Intent: No Is patient at risk for suicide?: No Suicidal Plan?: No Access to Means: No What has been your use of drugs/alcohol within the last 12 months?: Pt has history of using methamphetamines How many times?: 0 Other Self Harm Risks:  None Triggers for Past Attempts: None known Intentional Self Injurious Behavior: None Risk to Others: Homicidal Ideation: No Thoughts of Harm to Others: No Current Homicidal Intent: No Current Homicidal Plan: No Access to Homicidal Means: No Identified Victim: None History of harm to others?: No Assessment of Violence: In past 6-12 months Violent Behavior Description: Pt has history of putting holes in walls Does patient have access to weapons?: No Criminal Charges Pending?: No Does patient have a court date: No Prior Inpatient Therapy: Prior Inpatient Therapy: Yes Prior Therapy Dates: 2017, multiple admits Prior Therapy Facilty/Provider(s): [facility] Reason for Treatment: Psychosis Prior Outpatient Therapy: Prior Outpatient Therapy: Yes Prior Therapy Dates: unknown Prior Therapy Facilty/Provider(s): [facility] Reason for Treatment: Schizoaffective disorder Does patient have an ACCT team?: No Does patient have Intensive In-House Services?  : No Does patient have [facility] services? : No Does patient have P4CC services?: No  Past Medical History:  Past Medical History:  Diagnosis Date  . Depression   . Gender identity disorder   . Liver disorder   . PTSD (post-traumatic stress disorder)   . Schizoaffective disorder (HCC)    History reviewed. No pertinent surgical history. Family History:  Family History  Problem Relation Age of Onset  . Multiple sclerosis Mother   . Mental illness Neg Hx    Family Psychiatric  History: unknown Social History:  Social History   Substance and Sexual Activity  Alcohol Use Yes   Comment: occasional     Social History   Substance and Sexual Activity  Drug Use Yes  . Types: Cocaine, Methamphetamines, Marijuana  Comment: History of methamphetamines and marijuana    Social History   Socioeconomic History  . Marital status: Significant Other    Spouse name: Joshua Davila  . Number of children: Not on file  . Years of education: Not  on file  . Highest education level: Not on file  Occupational History  . Not on file  Tobacco Use  . Smoking status: Current Every Day Smoker    Packs/day: 1.00    Types: Cigarettes  . Smokeless tobacco: Never Used  Substance and Sexual Activity  . Alcohol use: Yes    Comment: occasional  . Drug use: Yes    Types: Cocaine, Methamphetamines, Marijuana    Comment: History of methamphetamines and marijuana  . Sexual activity: Not Currently  Other Topics Concern  . Not on file  Social History Narrative   Pt lives in [place].  Per history, Pt lives with significant other [facility] [phone]).  [name].   Social Determinants of Health   Financial Resource Strain:   . Difficulty of Paying Living Expenses: Not on file  Food Insecurity:   . Worried About Programme researcher, broadcasting/film/video in the Last Year: Not on file  . Ran Out of Food in the Last Year: Not on file  Transportation Needs:   . Lack of Transportation (Medical): Not on file  . Lack of Transportation (Non-Medical): Not on file  Physical Activity:   . Days of Exercise per Week: Not on file  . Minutes of Exercise per Session: Not on file  Stress:   . Feeling of Stress : Not on file  Social Connections:   . Frequency of Communication with Friends and Family: Not on file  . Frequency of Social Gatherings with Friends and Family: Not on file  . Attends Religious Services: Not on file  . Active Member of Clubs or Organizations: Not on file  . Attends Banker Meetings: Not on file  . Marital Status: Not on file   Additional Social History:    Allergies:   Allergies  Allergen Reactions  . Penicillins Swelling, Rash and Other (See Comments)    Reaction:  Facial swelling Has patient had a PCN reaction causing immediate rash, facial/tongue/throat swelling, SOB or lightheadedness with hypotension: Yes Has patient had a PCN reaction causing severe rash involving mucus membranes or  skin necrosis: No Has patient had a PCN reaction that required hospitalization No Has patient had a PCN reaction occurring within the last 10 years: No If all of the above answers are "NO", then may proceed with Cephalosporin use.  . Sulfa Antibiotics Rash    Labs:  Results for orders placed or performed during the hospital encounter of 03/23/19 (from the past 48 hour(s))  Comprehensive metabolic panel     Status: Abnormal   Collection Time: 03/23/19  3:39 PM  Result Value Ref Range   Sodium 139 135 - 145 mmol/L   Potassium 4.2 3.5 - 5.1 mmol/L   Chloride 105 98 - 111 mmol/L   CO2 24 22 - 32 mmol/L   Glucose, Bld 105 (H) 70 - 99 mg/dL   BUN 17 6 - 20 mg/dL   Creatinine, Ser 3.57 0.61 - 1.24 mg/dL   Calcium 9.5 8.9 - 01.7 mg/dL   Total Protein 7.4 6.5 - 8.1 g/dL   Albumin 4.3 3.5 - 5.0 g/dL   AST 36 15 - 41 U/L   ALT 40 0 - 44 U/L  Alkaline Phosphatase 58 38 - 126 U/L   Total Bilirubin 0.5 0.3 - 1.2 mg/dL   GFR calc non Af Amer >60 >60 mL/min   GFR calc Af Amer >60 >60 mL/min   Anion gap 10 5 - 15    Comment: Performed at [facility], [address] [place], [place] [address]  Ethanol     Status: None   Collection Time: 03/23/19  3:39 PM  Result Value Ref Range   Alcohol, Ethyl (B) <10 <10 mg/dL    Comment: (NOTE) Lowest detectable limit for serum alcohol is 10 mg/dL. For medical purposes only. Performed at [facility], [address] [place], [place] [address]   Urine rapid drug screen (hosp performed)     Status: None   Collection Time: 03/23/19  3:39 PM  Result Value Ref Range   Opiates NONE DETECTED NONE DETECTED   Cocaine NONE DETECTED NONE DETECTED   Benzodiazepines NONE DETECTED NONE DETECTED   Amphetamines NONE DETECTED NONE DETECTED   Tetrahydrocannabinol NONE DETECTED NONE DETECTED   Barbiturates NONE DETECTED NONE DETECTED    Comment: (NOTE) DRUG SCREEN FOR MEDICAL PURPOSES ONLY.  IF CONFIRMATION IS NEEDED FOR ANY PURPOSE, NOTIFY  LAB WITHIN 5 DAYS. LOWEST DETECTABLE LIMITS FOR URINE DRUG SCREEN Drug Class                     Cutoff (ng/mL) Amphetamine and metabolites    1000 Barbiturate and metabolites    200 Benzodiazepine                 200 Tricyclics and metabolites     300 Opiates and metabolites        300 Cocaine and metabolites        300 THC                            50 Performed at [facility], [address] [place], [place] [address]   CBC with Diff     Status: None   Collection Time: 03/23/19  3:39 PM  Result Value Ref Range   WBC 7.7 4.0 - 10.5 K/uL   RBC 4.66 4.22 - 5.81 MIL/uL   Hemoglobin 14.2 13.0 - 17.0 g/dL   HCT 25.941.1 56.339.0 - 87.552.0 %   MCV 88.2 80.0 - 100.0 fL   MCH 30.5 26.0 - 34.0 pg   MCHC 34.5 30.0 - 36.0 g/dL   RDW 64.313.7 32.911.5 - 51.815.5 %   Platelets 216 150 - 400 K/uL   nRBC 0.0 0.0 - 0.2 %   Neutrophils Relative % 60 %   Neutro Abs 4.7 1.7 - 7.7 K/uL   Lymphocytes Relative 30 %   Lymphs Abs 2.3 0.7 - 4.0 K/uL   Monocytes Relative 7 %   Monocytes Absolute 0.5 0.1 - 1.0 K/uL   Eosinophils Relative 1 %   Eosinophils Absolute 0.1 0.0 - 0.5 K/uL   Basophils Relative 1 %   Basophils Absolute 0.0 0.0 - 0.1 K/uL   Immature Granulocytes 1 %   Abs Immature Granulocytes 0.05 0.00 - 0.07 K/uL    Comment: Performed at [facility], [address] [place], [place] [address]  Respiratory Panel by RT PCR (Flu A&B, Covid) - Nasopharyngeal Swab     Status: None   Collection Time: 03/23/19  5:26 PM   Specimen: Nasopharyngeal Swab  Result Value Ref Range  SARS Coronavirus 2 by RT PCR NEGATIVE NEGATIVE    Comment: (NOTE) SARS-CoV-2 target nucleic acids are NOT DETECTED. The SARS-CoV-2 RNA is generally detectable in upper respiratoy specimens during the acute phase of infection. The lowest concentration of SARS-CoV-2 viral copies this assay can detect is 131 copies/mL. A negative result does not preclude SARS-Cov-2 infection and should not be used as the sole basis for  treatment or other patient management decisions. A negative result may occur with  improper specimen collection/handling, submission of specimen other than nasopharyngeal swab, presence of viral mutation(s) within the areas targeted by this assay, and inadequate number of viral copies (<131 copies/mL). A negative result must be combined with clinical observations, patient history, and epidemiological information. The expected result is Negative. Fact Sheet for Patients:  PinkCheek.be Fact Sheet for Healthcare Providers:  GravelBags.it This test is not yet ap proved or cleared by the [place] FDA and  has been authorized for detection and/or diagnosis of SARS-CoV-2 by FDA under an Emergency Use Authorization (EUA). This EUA will remain  in effect (meaning this test can be used) for the duration of the COVID-19 declaration under Section 564(b)(1) of the Act, 21 U.S.C. section 360bbb-3(b)(1), unless the authorization is terminated or revoked sooner.    Influenza A by PCR NEGATIVE NEGATIVE   Influenza B by PCR NEGATIVE NEGATIVE    Comment: (NOTE) The Xpert Xpress SARS-CoV-2/FLU/RSV assay is intended as an aid in  the diagnosis of influenza from Nasopharyngeal swab specimens and  should not be used as a sole basis for treatment. Nasal washings and  aspirates are unacceptable for Xpert Xpress SARS-CoV-2/FLU/RSV  testing. Fact Sheet for Patients: PinkCheek.be Fact Sheet for Healthcare Providers: GravelBags.it This test is not yet approved or cleared by the [place] FDA and  has been authorized for detection and/or diagnosis of SARS-CoV-2 by  FDA under an Emergency Use Authorization (EUA). This EUA will remain  in effect (meaning this test can be used) for the duration of the  Covid-19 declaration under Section 564(b)(1) of the Act, 21  U.S.C. section  360bbb-3(b)(1), unless the authorization is  terminated or revoked. Performed at [facility], [place] [address], [place], [place] [address]     Medications:  No current facility-administered medications for this encounter.   No current outpatient medications on file.    Musculoskeletal: Unable to assess via telepsych  Psychiatric Specialty Exam: Physical Exam  Constitutional: He appears well-developed.  HENT:  Head: Normocephalic.  Eyes: Pupils are equal, round, and reactive to light.  Cardiovascular: Normal rate.  Respiratory: Effort normal.  Musculoskeletal:        General: Normal range of motion.     Cervical back: Normal range of motion.  Neurological: He is alert.  Psychiatric: His affect is angry and blunt. He is agitated. Thought content is paranoid.    Review of Systems  Psychiatric/Behavioral: Positive for agitation and dysphoric mood.    Blood pressure 107/65, pulse 62, temperature (!) 97.2 F (36.2 C), temperature source Oral, resp. rate 14, SpO2 100 %.There is no height or weight on file to calculate BMI.  General Appearance: Bizarre  Eye Contact:  Fair  Speech:  Clear and Coherent  Volume:  Increased  Mood:  Angry, Dysphoric and Irritable  Affect:  Blunt and Congruent  Thought Process:  Disorganized and Descriptions of Associations: Tangential  Orientation:  Other:  to name and place   Thought Content:  Illogical and Delusions  Suicidal Thoughts:  No  Homicidal  Thoughts:  No  Memory:  Negative Immediate;   Negative  Judgement:  Impaired  Insight:  Lacking  Psychomotor Activity:  Increased  Concentration:  Concentration: Poor and Attention Span: Poor  Recall:  Poor  Fund of Knowledge:  Fair  Language:  Fair  Akathisia:  Negative  Handed:  Right  AIMS (if indicated):     Assets:  Physical Health Resilience  ADL's:  Intact  Cognition:  Impaired,  Mild  Sleep:   <6 hours    Disposition: Patient continues to meet the need for inpatient  treatment, currently awaiting placement. Recommend psychiatric Inpatient admission when medically cleared.    Spoke with Dr. Myrlene Broker, EDP; informed of above recommendation and disposition  He is currently getting haldol as prn medication, recommend the following: -EKG prior to starting haldol -Haldol 2mg  po bid for psychosis -Congentin 0.5mg  po BID for EPS -Vistaril 25mg  po TID for anxiety  This service was provided via telemedicine using a 2-way, interactive audio and video technology.  Names of all persons participating in this telemedicine service and their role in this encounter. Name: Alucard Fearnow Role:Patient  Name: Role: PMHNP    Pearline Cables, NP 03/24/2019 1:48 PM

## 2019-03-24 NOTE — ED Notes (Signed)
Provided EDP with EKG; awaiting further orders

## 2019-03-24 NOTE — Progress Notes (Signed)
This patient continues to meet inpatient criteria. CSW faxed information to the following facilities:   [facility]  Enid Cutter, [place] Clinical Social Worker

## 2019-03-24 NOTE — ED Notes (Signed)
Pt at nurses station using nurses phone.

## 2019-03-24 NOTE — ED Notes (Signed)
Pt speaking with BH NP at this time.

## 2019-03-24 NOTE — BH Assessment (Signed)
Pt presents sitting on edge of his bed. He states he just woke up and asked for the time. Pt accurately stated the date, day of the week & that he is at [facility]. Pt reports he checked himself in because he wanted his ears checked out due to possible ear mites or fleas, and all over body pain. Pt states he lives in [place] and has 2 roommates. He then stated "I know your plan. It's not funny, stop laughing at me". Pt denies SI, HI and AVH. He was unwilling to talk any more. Inpt tx continues to be recommended.

## 2019-03-25 MED ORDER — NICOTINE 14 MG/24HR TD PT24
14.0000 mg | MEDICATED_PATCH | Freq: Every day | TRANSDERMAL | Status: DC
  Administered 2019-03-25 – 2019-03-27 (×3): 14 mg via TRANSDERMAL
  Filled 2019-03-25 (×3): qty 1

## 2019-03-25 MED ORDER — BENZTROPINE MESYLATE 1 MG PO TABS
0.5000 mg | ORAL_TABLET | Freq: Two times a day (BID) | ORAL | Status: DC
  Administered 2019-03-25 – 2019-03-27 (×4): 0.5 mg via ORAL
  Filled 2019-03-25 (×4): qty 1

## 2019-03-25 MED ORDER — HALOPERIDOL 1 MG PO TABS
2.0000 mg | ORAL_TABLET | Freq: Two times a day (BID) | ORAL | Status: DC
  Administered 2019-03-25 – 2019-03-26 (×3): 2 mg via ORAL
  Filled 2019-03-25 (×3): qty 2

## 2019-03-25 NOTE — ED Notes (Signed)
Pt aware will apply Nicotine Patch after he showers.

## 2019-03-25 NOTE — ED Notes (Signed)
Pt stating he wants his papers so that he can be released. Advised pt of tx plan - meets Inpt Criteria and [facility] SW will speak w/him tomorrow. Pt returned to room - continues to ask if he can leave.

## 2019-03-25 NOTE — ED Notes (Signed)
Pt made phone call at nurses' desk - left message for someone - advising that he loves them and that he is here "because I need to get my ears cleaned out. There's a lot of stuff in them". Pt returned to room. Sitter w/pt.

## 2019-03-25 NOTE — ED Notes (Signed)
Pt ambulated to bathroom - stated "Y'all are just out to get me". Pt noted to be upset d/t he was unaware toilet had been cleaned. Pt will not use bathroom unless has been cleaned prior to each use. Advised pt we are here to help him. Pt given new face mask - instructed to put it on - pt complied and returned to room as directed.

## 2019-03-25 NOTE — ED Notes (Signed)
Pt spoke w/spouse on phone at nurses' desk.

## 2019-03-25 NOTE — ED Notes (Signed)
Patient had and yelling episode as RN was administering medication; Pt did not attempt to get out of bed but just yelled and laid back down-Monique,RN

## 2019-03-25 NOTE — BH Assessment (Signed)
[facility] Assessment Progress Note    Patient was seen for reassessment this morning.  He was very irritable and his thoughts were very disorganized and he made little sense with the content of what he was saying.  He continually repeated that he needed a shower and his ears cleaned out and that there was nothing wrong with him mentally, just physically.  Patient continues to exhibit the need for inpatient treatment.  TTS/Social Work to continue to seek placement.

## 2019-03-25 NOTE — ED Notes (Signed)
Per Marlinda Mike, BH NP, Haldol 2mg  BID and Cogentin 0.5mg  BID rather than prn. Order received.

## 2019-03-25 NOTE — ED Notes (Signed)
Pt asking for 3rd cup of coffee - advised pt he may have water until lunch arrives. Pt stated he did not want water.

## 2019-03-25 NOTE — ED Notes (Signed)
Pt continues w/bizarre behaviors. Pt asked for door to room to be closed x 2. Sitter advised door must be keep cracked open. Pt then stated "Well, just don't let anybody in my room!"

## 2019-03-25 NOTE — ED Notes (Signed)
Pt given coffee for snack as requested after pt showered.

## 2019-03-25 NOTE — ED Notes (Signed)
Pt ate breakfast. Pt initially attempting to refuse to use bathroom near his room - stating he wanted to use other bathroom. Pt then complied.

## 2019-03-25 NOTE — Progress Notes (Signed)
Patient ID: Joshua Davila, male   DOB: [birth year], 39 y.o.   MRN: [id number]  Chart reviewed, collateral information received from nursing staff during AM report.     Patient remains irritable, paranoid, delusional and is very disorganized.  He is being dosed with the following medications prn but recommend adjusting to standard daily dosing.   -Haldol 2mg  po bid for psychosis -Congentin 0.5mg  po BID for EPS -Vistaril 25mg  po TID for anxiety  He continues to meet the criteria for inpatient hospitalization and is awaiting placement.

## 2019-03-26 MED ORDER — ACETAMINOPHEN 325 MG PO TABS
650.0000 mg | ORAL_TABLET | Freq: Four times a day (QID) | ORAL | Status: DC | PRN
  Filled 2019-03-26: qty 2

## 2019-03-26 NOTE — ED Notes (Signed)
IVC paperwork served - Copy faxed to [facility] - Copy sent to Medical Records - Original placed in folder for Magistrate - ALL 3 sets on clipboard.  

## 2019-03-26 NOTE — ED Notes (Signed)
Pt's spouse called and advised he is pt's Legal Guardian. Advised copy of paperwork was given last year. States he does not want pt to be d/c'd - states pt needs Inpt Tx as pt has not been taking his meds, destroying property, and communicating threats.

## 2019-03-26 NOTE — B[facility] Counselor (Signed)
[facility] Re-Assessment Progress Note   Patient seen for re-assessment this morning. Patient stated was he just waking up. Patient reported he has been trying to get his ears cleaned out and also wanted to know what's taking so long for him to sign his release papers to go home. When asked who do you live with patient stated 'Dwayne Loleta Chance' his husband. He reported he lives at [address]. TTS checked the client's face sheet. Information on the face sheet matches to what client stated.   Disposition: Ophelia Shoulder, NP, report she will review patient's file before providing a disposition

## 2019-03-26 NOTE — ED Notes (Signed)
Pt in shower.  

## 2019-03-26 NOTE — Progress Notes (Signed)
Patient continues to meet criteria for inpatient treatment per Ophelia Shoulder, NP. No appropriate beds at [facility] currently. CSW faxed referrals to the following facilities for review:  [facility]  [facility]  [facility]  [facility]    TTS will continue to seek bed placement.     Ruthann Cancer MSW, [facility] Clincal Social Worker Disposition  [facility] Ph: [phone] Fax: [phone] 03/26/2019 11:44 AM

## 2019-03-26 NOTE — Consult Note (Signed)
Chart reviewed and patient seen for face to face examination.  Per nursing note, patient was yelling in his bed last night.  He appears more clear than he was yesterday, he no longer reports fleas in his ears but continues to request care for his ears.  He has given up on delusion in exchange for another.  He now believes this Clinical research associate is intentionally withholding information about his husband from him.   He is also paranoid, insistent that someone else is in the room with him during the video call. Of note, the patient was the only one present during the evaluation.  Progressing irritability while during the assessment.

## 2019-03-26 NOTE — ED Notes (Signed)
Pt woke up - ambulated to bathroom and back to room - snack to be given by [employer].

## 2019-03-26 NOTE — ED Notes (Signed)
Telepsych being performed. 

## 2019-03-26 NOTE — ED Notes (Addendum)
Pt called spouse. Aware this was his 2nd phone call for the day. Pt being given snacks. Pt noted to be delusional - pointing toward crackers and sugar - stating he wants the bag of candy. Advised pt it is not candy. Pt had advised Sitter earlier there were people in his room and he wanted them out when another pt was talking loudly in hallway.

## 2019-03-26 NOTE — ED Notes (Signed)
Hinton Rao, Charge RN, aware, per [employer], no Sitter available for pt at 2300.

## 2019-03-26 NOTE — ED Notes (Signed)
Pt asking for med to "calm my nerves". Advised pt Atarax given. Pt then requesting Tylenol for headache - given.

## 2019-03-26 NOTE — ED Notes (Signed)
Pt ate lunch - now lying on bed w/eyes closed. Respirations even, unlabored.

## 2019-03-26 NOTE — ED Notes (Signed)
Magistrate received IVC paperwork 

## 2019-03-26 NOTE — ED Notes (Addendum)
Pt ambulated to bathroom and back to room w/o difficulty. Pt approached nurses' desk asking for "Nicotine". Advised pt he has patch on his arm. Pt asking for Nicotine gum. Pt asking for snacks - advised pt of snack times as discussed previously - pt noted to become upset - then returned to room.

## 2019-03-26 NOTE — ED Notes (Addendum)
Pt talking on phone w/significant other - Pt asking if he may leave. Advised pt waiting to hear back from [facility].

## 2019-03-26 NOTE — ED Notes (Signed)
inpt   Breakfast ordered  

## 2019-03-27 DIAGNOSIS — F1520 Other stimulant dependence, uncomplicated: Secondary | ICD-10-CM

## 2019-03-27 DIAGNOSIS — F4310 Post-traumatic stress disorder, unspecified: Secondary | ICD-10-CM

## 2019-03-27 MED ORDER — HALOPERIDOL 5 MG PO TABS: 5.0000 mg | ORAL_TABLET | Freq: Two times a day (BID) | ORAL | 1 refills | Status: DC

## 2019-03-27 MED ORDER — HALOPERIDOL 5 MG PO TABS: 5.0000 mg | ORAL_TABLET | Freq: Two times a day (BID) | ORAL | Status: DC

## 2019-03-27 MED ORDER — HALOPERIDOL DECANOATE 100 MG/ML IM SOLN
100.0000 mg | Freq: Once | INTRAMUSCULAR | Status: AC
  Administered 2019-03-27: 100 mg via INTRAMUSCULAR
  Filled 2019-03-27: qty 1

## 2019-03-27 MED ORDER — HYDROXYZINE HCL 25 MG PO TABS: 25.0000 mg | ORAL_TABLET | Freq: Three times a day (TID) | ORAL | 0 refills | Status: DC | PRN

## 2019-03-27 MED ORDER — BENZTROPINE MESYLATE 0.5 MG PO TABS: 0.5000 mg | ORAL_TABLET | Freq: Two times a day (BID) | ORAL | 1 refills | Status: DC

## 2019-03-27 NOTE — Progress Notes (Addendum)
CSW left HIPAA compliant voice message with pt's spouse, Dwyane requesting call back to discuss disposition.   Wells Guiles, LCSW, LCAS Disposition CSW [facility]/TTS [phone]  UPDATE: CSW left another voice message, this time with spouse's number [phone], that was provided by [facility] RN. CSW left message requesting a return call.

## 2019-03-27 NOTE — ED Notes (Signed)
Breakfast tray given. Pt exhibited irritability with sitter when offered shower.

## 2019-03-27 NOTE — ED Notes (Signed)
PT contacted husband to get him in an hour after med hold.

## 2019-03-27 NOTE — ED Notes (Signed)
Breakfast ordered 

## 2019-03-27 NOTE — Discharge Instructions (Signed)
Follow up with [facility]    Psychosis Psychosis, also called thought disturbance, refers to a severe loss of contact with reality. People having a psychotic episode are not able to think clearly, and their emotions and responses do not match with what is actually happening. People having a psychotic episode may have false beliefs about what is happening or who they are (delusions). They may see, hear, taste, smell, or feel things that are not present (hallucinations). They may also be very upset (agitated), have chaotic behavior, or be very quiet and withdrawn. What are the causes? This condition may be caused by:  Very serious mental health (psychiatric) conditions such as schizophrenia, bipolar disorder, or major depression.  Use of drugs such as hallucinogens or alcohol.  Medical conditions such as delirium or neurological disorders. What are the signs or symptoms? Symptoms of this condition include:  Delusions, such as: ? Feeling a lot of fear or suspicion (paranoia). ? Believing something that is odd, unrealistic, or false, such as believing that you are someone else.  Hallucinations, such as: ? Hearing or seeing things, smelling odors, experiencing tastes, or feeling bodily sensations. ? Command hallucinations that direct you to do something that could be dangerous.  Disorganized thinking, such as thoughts that jump from one idea to another in a way that does not make sense.  Disorganized speech, such as saying things that do not make sense, echoing others, or using words based on their sound rather than their meaning.  Inappropriate behavior, such as talking to yourself, showing a clear increase or decrease in activity, or intruding on unfamiliar people. How is this diagnosed? This condition is diagnosed based on an assessment by a health care provider.  The health care provider may ask questions about: ? Your thoughts, feelings, and behavior. ? Any medical conditions you  have. ? Any use of alcohol or drugs.  One or more of the following may also be done: ? A physical exam. ? Blood tests. ? Brain imaging, such as a CT scan or MRI. ? A brain wave study (electroencephalogram, or EEG). The health care provider may refer you to a mental health professional for further tests. How is this treated? Treatment for this condition may depend on the cause of the psychosis. Treatment may include one or more of the following:  Supportive care and monitoring in the emergency room or hospital. You may need to stay in the hospital if you are a danger to yourself or others.  Taking antipsychotic medicines to reduce symptoms and to balance chemicals in the brain.  Treating an underlying medical condition.  Stopping or reducing drugs that are causing psychosis.  Therapy and other supportive programs, such as: ? Ongoing treatment and care from a mental health professional. ? Individual or family therapy. ? Training to learn new skills to cope with the psychosis and prevent further episodes. Follow these instructions at home:  Take over-the-counter and prescription medicines only as told by your health care provider.  Consult a health care provider before taking over-the-counter medicines, herbs, or supplements.  Surround yourself with people who care about you and can help manage your condition.  Keep stress under control. Stress may trigger psychosis and make symptoms worse.  Maintain a healthy lifestyle. This includes: ? Eating a healthy diet. ? Getting enough sleep. ? Exercising regularly. ? Avoiding alcohol, nicotine, and recreational drugs.  Keep all follow-up visits as told by your health care provider. This is important. Contact a health care provider if:  Medicines do not seem to be helping.  You or others notice that you: ? Continue to see, smell, or feel things that are not there. ? Hear voices telling you to do things. ? Feel extremely fearful  and suspicious that someone or something will harm you. ? Feel unable to leave your house. ? Have trouble taking care of yourself.  You have side effects of medicines, such as: ? Changes in sleep patterns. ? Dizziness. ? Weight gain. ? Restlessness. ? Movement changes. ? Shaking that you cannot control (tremors). Get help right away if:  You have serious side effects of medicine, such as: ? Swelling of the face, lips, tongue, or throat. ? Fever, confusion, muscle spasms, or seizures.  You have serious thoughts about harming yourself or hurting others. If you ever feel like you may hurt yourself or others, or have thoughts about taking your own life, get help right away. You can go to your nearest emergency department or call:  Your local emergency services (911 in the [place]  A suicide crisis helpline, such as the National Suicide Prevention Lifeline at 438 436 0476. This is open 24 hours a day. Summary  Psychosis refers to a severe loss of contact with reality. People having a psychotic episode are not able to think clearly, and they may have delusions or hallucinations.  Psychosis is a serious medical condition that should be treated by a medical professional as soon as possible. Being checked and treated right away can stop or reduce symptoms. This prevents more serious problems from developing.  In some cases, treatment may include taking antipsychotic medicines to reduce symptoms and to balance chemicals in the brain.  Support programs may help you learn new skills to cope with the psychosis and prevent further episodes. This information is not intended to replace advice given to you by your health care provider. Make sure you discuss any questions you have with your health care provider. Document Revised: 05/21/2017 Document Reviewed: 04/20/2017 Elsevier Patient Education  2020 [employer].

## 2019-03-27 NOTE — Progress Notes (Signed)
CSW spoke with pt's spouse. Mr Loleta Chance voiced concerns about pt being psychiatrically cleared. "He hasn't taken meds in 3 years and needs help". Per report pt refuses to engage in outpatient therapy or psychiatry and refuses services at the [place]. Mr Loleta Chance thanked CSW for listening and shared that pt's recent legal charges may provide the leverage needed to motivate pt to make needed changes.  A family friend is on the way to pick pt up from [facility] ED.   Wells Guiles, LCSW, LCAS Disposition CSW [facility]/TTS [phone]

## 2019-03-27 NOTE — ED Notes (Signed)
Pt notified husband he is leaving- ride is outside waiting. RN informed him that Huntley Dec has been trying to get in touch with him and left several messages. Her direct number given to him and instructed to call her now.

## 2019-03-27 NOTE — ED Notes (Signed)
Patient refused sitter to take vitals-Monique,RN

## 2019-03-27 NOTE — ED Notes (Signed)
RN talked to pt's husband after he spoke to him. RN advised that pt being discharged in hour after med hold and requested ride for him.

## 2019-03-27 NOTE — Consult Note (Signed)
[facility] Psych ED Discharge  03/27/2019 10:06 AM Joshua Davila  MRN:  [id number] Principal Problem: PTSD (post-traumatic stress disorder) Discharge Diagnoses: Principal Problem:   PTSD (post-traumatic stress disorder) Active Problems:   Amphetamine and psychostimulant dependence (HCC)   Schizoaffective disorder, bipolar type (HCC)  Subjective: "I'm just sleepy."  Denies suicidal/homicidal ideations, hallucinations, and withdrawal symptoms.  Patient seen and evaluated in person by this provider.  He is well-known to the emergency department and this provider for similar presentations.  Typically he is using meth and admitted for aggression and psychosis.  Once medications were started and he sleeps, he clears and returns home.  This time he was admitted after feeling he had cuts and burns all over him after using meth 4 days prior and being paranoid and aggressive.  Medications were started and patient stabilized he, he is currently at his baseline.  No paranoia or delusions, denies suicidal/homicidal ideations and withdrawal symptoms.  He is pleasant to staff and this provider today, waited patiently to take a shower with no issues noted.  Psychiatrically cleared at this time and RN notified his guardian.  His legal guardian is his husband, [name], who is aware the patient is being discharged and told the nurse he is not happy with his decision and wanted to speak with someone.  This provider did call in a voicemail answered, short message left with return phone number in the notification that the patient is psychiatrically cleared.  Discarding as well none to be difficult and often times intentionally not responding to calls.  Social worker from [facility] also attempted to call twice with no response or return calls.  Again he is psychiatrically cleared for discharge.  HPI on admission:  Joshua Davila is an 39 y.o. male who presents unaccompanied to [facility] ED reporting that he has "cuts and burns all  over" but no cuts or burns are visible. Pt has a history of schizoaffective disorder, PTSD and methamphetamine use. Pt states that he last use methamphetamines four days ago. Pt says he doesn't take psychiatric medications. Pt presents as paranoid and disorganized. He was pacing up and down the hallway, agitated and yelling. He states nursing staff was laughing at him. He was given medication and during assessment he was very drowsy and had difficulty answering questions appropriately. He acknowledges he has felt depressed recently. He says he has not been sleeping but has been eating appropriately. He denies current suicidal ideation or history of suicide attempts. He denies current homicidal ideation. Pt's medical record indicates he has a history of aggression and punching and kicking holes in walls. Pt denies auditory or visual hallucinations.  Pt was unable to answer any other questions. Pt's medical record indicates Pt lives with his partner/husband [facility] [phone]. TTS attempted to contact Mr Loleta Chance without success. Pt has a history of outpatient mental health treatment at the [facility] hospital. He has been psychiatrically hospitalized at [facility] most recently in 2017.  Pt is dressed in hospital scrubs, very drowsy and oriented to person and place but not time or situation. Pt speaks in a clear tone, at moderate volume and normal pace. Motor behavior appears normal but Pt was pacing and restless earlier. Eye contact is good. Pt's mood is depressed and affect is irritable. Thought process is tangential and disorganized. Pt appears to be experiencing paranoid and delusional thought content.  Total Time spent with patient: 30 minutes  Past Psychiatric History: PTSD, methamphetamine use d/o, schizoaffective d/o  Past Medical History:  Past Medical History:  Diagnosis Date  . Depression   . Gender identity disorder   . Liver disorder   . PTSD (post-traumatic stress disorder)   .  Schizoaffective disorder ([place])    History reviewed. No pertinent surgical history. Family History:  Family History  Problem Relation Age of Onset  . Multiple sclerosis Mother   . Mental illness Neg Hx    Family Psychiatric  History: none Social History:  Social History   Substance and Sexual Activity  Alcohol Use Yes   Comment: occasional     Social History   Substance and Sexual Activity  Drug Use Yes  . Types: Cocaine, Methamphetamines, Marijuana   Comment: History of methamphetamines and marijuana    Social History   Socioeconomic History  . Marital status: Significant Other    Spouse name: [name]  . Number of children: Not on file  . Years of education: Not on file  . Highest education level: Not on file  Occupational History  . Not on file  Tobacco Use  . Smoking status: Current Every Day Smoker    Packs/day: 1.00    Types: Cigarettes  . Smokeless tobacco: Never Used  Substance and Sexual Activity  . Alcohol use: Yes    Comment: occasional  . Drug use: Yes    Types: Cocaine, Methamphetamines, Marijuana    Comment: History of methamphetamines and marijuana  . Sexual activity: Not Currently  Other Topics Concern  . Not on file  Social History Narrative   Pt lives in [place].  Per history, Pt lives with significant other [facility] [phone]).  [name] stated that he is Pt's Medical POA.   Social Determinants of Health   Financial Resource Strain:   . Difficulty of Paying Living Expenses: Not on file  Food Insecurity:   . Worried About Charity fundraiser in the Last Year: Not on file  . Ran Out of Food in the Last Year: Not on file  Transportation Needs:   . Lack of Transportation (Medical): Not on file  . Lack of Transportation (Non-Medical): Not on file  Physical Activity:   . Days of Exercise per Week: Not on file  . Minutes of Exercise per Session: Not on file  Stress:   . Feeling of Stress : Not on file  Social Connections:   .  Frequency of Communication with Friends and Family: Not on file  . Frequency of Social Gatherings with Friends and Family: Not on file  . Attends Religious Services: Not on file  . Active Member of Clubs or Organizations: Not on file  . Attends Archivist Meetings: Not on file  . Marital Status: Not on file    Has this patient used any form of tobacco in the last 30 days? (Cigarettes, Smokeless Tobacco, Cigars, and/or Pipes) A prescription for an FDA-approved tobacco cessation medication was offered at discharge and the patient refused  Current Medications: Current Facility-Administered Medications  Medication Dose Route Frequency Provider Last Rate Last Admin  . acetaminophen (TYLENOL) tablet 650 mg  650 mg Oral Q6H PRN Virgel Manifold, MD      . benztropine (COGENTIN) tablet 0.5 mg  0.5 mg Oral BID Fredia Sorrow, MD   0.5 mg at 03/26/19 2138  . haloperidol (HALDOL) tablet 5 mg  5 mg Oral BID Patrecia Pour, NP      . hydrOXYzine (ATARAX/VISTARIL) tablet 25 mg  25 mg Oral TID PRN Tegeler, Gwenyth Allegra,  MD   25 mg at 03/26/19 2138  . nicotine (NICODERM CQ - dosed in mg/24 hours) patch 14 mg  14 mg Transdermal Daily Curatolo, Adam, DO   14 mg at 03/27/19 0951   No current outpatient medications on file.   PTA Medications: (Not in a hospital admission)   Musculoskeletal: Strength & Muscle Tone: within normal limits Gait & Station: normal Patient leans: N/A  Psychiatric Specialty Exam: Physical Exam Constitutional:      Appearance: Normal appearance.  HENT:     Head: Normocephalic.     Nose: Nose normal.  Pulmonary:     Effort: Pulmonary effort is normal.  Musculoskeletal:        General: Normal range of motion.  Neurological:     General: No focal deficit present.     Mental Status: He is alert and oriented to person, place, and time.     Review of Systems  Blood pressure 112/73, pulse 76, temperature 98.4 F (36.9 C), temperature source Oral, resp. rate  18, SpO2 98 %.There is no height or weight on file to calculate BMI.  General Appearance: Casual  Eye Contact:  Good  Speech:  Normal Rate  Volume:  Normal  Mood:  Anxious  Affect:  Blunt  Thought Process:  Coherent and Descriptions of Associations: Intact  Orientation:  Full (Time, Place, and Person)  Thought Content:  WDL and Logical  Suicidal Thoughts:  No  Homicidal Thoughts:  No  Memory:  Immediate;   Fair Recent;   Fair Remote;   Fair  Judgement:  Fair  Insight:  Fair  Psychomotor Activity:  Normal  Concentration:  Concentration: Fair and Attention Span: Fair  Recall:  [employer] of Knowledge:  Fair  Language:  Good  Akathisia:  No  Handed:  Right  AIMS (if indicated):     Assets:  Leisure Time Resilience Social Support  ADL's:  Intact  Cognition:  WNL  Sleep:        Demographic Factors:  Male, Caucasian and Gay, lesbian, or bisexual orientation  Loss Factors: NA  Historical Factors: NA  Risk Reduction Factors:   Sense of responsibility to family, Living with another person, especially a relative and Positive social support  Continued Clinical Symptoms:  Anxiety, mild  Cognitive Features That Contribute To Risk:  None    Suicide Risk:  Minimal: No identifiable suicidal ideation.  Patients presenting with no risk factors but with morbid ruminations; may be classified as minimal risk based on the severity of the depressive symptoms   Plan Of Care/Follow-up recommendations:  Schizoaffective disorder, bipolar type: -Increased Haldol 2 mg BID to 5 mg BID -Haldol decanoate 100 mg IM once -Follow up with [facility]  EPS: -Continue Cogentin 0.5 mg BID  Anxiety: -Continue hydroxyzine 25 mg TID PRN  Activity:  as tolerated Diet:  heart healthy diet  Disposition: discharge home Nanine Means, NP 03/27/2019, 10:06 AM

## 2019-04-24 ENCOUNTER — Other Ambulatory Visit (HOSPITAL_COMMUNITY): Payer: Self-pay | Admitting: Psychiatry

## 2019-07-21 ENCOUNTER — Ambulatory Visit (HOSPITAL_COMMUNITY)
Admission: EM | Admit: 2019-07-21 | Discharge: 2019-07-21 | Disposition: A | Discharge status: Home / Self Care | Payer: Medicare Other | Attending: Internal Medicine | Admitting: Internal Medicine

## 2019-07-21 ENCOUNTER — Encounter (HOSPITAL_COMMUNITY): Payer: Self-pay

## 2019-07-21 ENCOUNTER — Other Ambulatory Visit: Payer: Self-pay

## 2019-07-21 DIAGNOSIS — Z760 Encounter for issue of repeat prescription: Secondary | ICD-10-CM

## 2019-07-21 MED ORDER — HALOPERIDOL 5 MG PO TABS: 5.0000 mg | ORAL_TABLET | Freq: Two times a day (BID) | ORAL | 1 refills | Status: DC

## 2019-07-21 MED ORDER — BENZTROPINE MESYLATE 0.5 MG PO TABS: 0.5000 mg | ORAL_TABLET | Freq: Two times a day (BID) | ORAL | 1 refills | Status: DC

## 2019-07-21 MED ORDER — HYDROXYZINE HCL 25 MG PO TABS: 25.0000 mg | ORAL_TABLET | Freq: Three times a day (TID) | ORAL | 1 refills | Status: DC | PRN

## 2019-07-21 NOTE — ED Provider Notes (Signed)
[facility]    CSN: [id number] Arrival date & time: 07/21/19  1407      History   Chief Complaint Chief Complaint  Patient presents with  . Medication Refill    HPI Joshua Davila is a 39 y.o. male group home resident with a history of schizoaffective disorder is accompanied by caregiver to the urgent care today.  Patient is actively hallucinating.  The request is for medication refill.  Patient takes benztropine, haloperidol and hydroxyzine.   HPI  Past Medical History:  Diagnosis Date  . Depression   . Gender identity disorder   . Liver disorder   . PTSD (post-traumatic stress disorder)   . Schizoaffective disorder [facility])     Patient Active Problem List   Diagnosis Date Noted  . 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 Schizoaffective disorder (HCC) 11/03/2018  . Aggressive behavior   . Alcohol use disorder, severe, in early remission, in controlled environment (HCC) 04/22/2017  . Polysubstance abuse (HCC)   . Schizoaffective disorder, bipolar type (HCC) 10/02/2015  . Involuntary commitment 10/02/2015  . Cannabis use disorder, moderate, dependence (HCC) 09/16/2015  . Opioid use disorder, moderate, dependence (HCC) 09/16/2015  . Substance or medication-induced bipolar and related disorder with onset during intoxication (HCC) 09/16/2015  . 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 Benzodiazepine dependence (HCC) 10/04/2013  . PTSD (post-traumatic stress disorder) 10/03/2013  . Substance induced mood disorder (HCC) 10/03/2013  . 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 No pertinent surgical history.     Home Medications    Prior to Admission medications   Medication Sig Start Date End Date Taking? Authorizing Provider  benztropine (COGENTIN) 0.5 MG tablet Take 1 tablet (0.5 mg total) by  mouth 2 (two) times daily. 07/21/19   Kathline Banbury, Britta Mccreedy, MD  haloperidol (HALDOL) 5 MG tablet Take 1 tablet (5 mg total) by mouth 2 (two) times daily. 07/21/19   LampteyBritta Mccreedy, MD  hydrOXYzine (ATARAX/VISTARIL) 25 MG tablet Take 1 tablet (25 mg total) by mouth every 8 (eight) hours as needed for anxiety. 07/21/19   LampteyBritta Mccreedy, MD    Family History Family History  Problem Relation Age of Onset  . Multiple sclerosis Mother   . Mental illness Neg Hx     Social History Social History   Tobacco Use  . Smoking status: Current Every Day Smoker    Packs/day: 1.00    Types: Cigarettes  . Smokeless tobacco: Never Used  Substance Use Topics  . Alcohol use: Yes    Comment: occasional  . Drug use: Yes    Types: Cocaine, Methamphetamines, Marijuana    Comment: History of methamphetamines and marijuana     Allergies   Penicillins and Sulfa antibiotics   Review of Systems Review of Systems  Unable to perform ROS: Psychiatric disorder     Physical Exam Triage Vital Signs ED Triage Vitals  Enc Vitals Group     BP 07/21/19 1434 107/66     Pulse Rate 07/21/19 1434 96     Resp 07/21/19 1434 18     Temp 07/21/19 1434 98.5 F (36.9 C)     Temp Source 07/21/19 1434 Oral     SpO2 07/21/19 1434 98 %     Weight --      Height --      Head Circumference --  Peak Flow --      Pain Score 07/21/19 1433 0     Pain Loc --      Pain Edu? --      Excl. in [place]? --    No data found.  Updated Vital Signs BP 107/66 (BP Location: Right Arm)   Pulse 96   Temp 98.5 F (36.9 C) (Oral)   Resp 18   SpO2 98%   Visual Acuity Right Eye Distance:   Left Eye Distance:   Bilateral Distance:    Right Eye Near:   Left Eye Near:    Bilateral Near:     Physical Exam Vitals and nursing note reviewed.  Constitutional:      General: He is not in acute distress.    Appearance: He is not ill-appearing.  Cardiovascular:     Rate and Rhythm: Normal rate and regular rhythm.      Pulses: Normal pulses.     Heart sounds: Normal heart sounds.  Pulmonary:     Effort: Pulmonary effort is normal.     Breath sounds: Normal breath sounds.  Abdominal:     General: Bowel sounds are normal.     Palpations: Abdomen is soft.  Musculoskeletal:        General: Normal range of motion.  Neurological:     General: No focal deficit present.     Mental Status: He is alert and oriented to person, place, and time.  Psychiatric:     Comments: Hallucination.      UC Treatments / Results  Labs (all labs ordered are listed, but only abnormal results are displayed) Labs Reviewed - No data to display  EKG   Radiology No results found.  Procedures Procedures (including critical care time)  Medications Ordered in UC Medications - No data to display  Initial Impression / Assessment and Plan / UC Course  I have reviewed the triage vital signs and the nursing notes.  Pertinent labs & imaging results that were available during my care of the patient were reviewed by me and considered in my medical decision making (see chart for details).    1. Schizoaffective disorder,uncontrolled: Refill Haloperidol, Hydroxyzine and Benztropine Return precautions given   Final Clinical Impressions(s) / UC Diagnoses   Final diagnoses:  Medication refill   Discharge Instructions   None    ED Prescriptions    Medication Sig Dispense Auth. Provider   haloperidol (HALDOL) 5 MG tablet Take 1 tablet (5 mg total) by mouth 2 (two) times daily. 60 tablet Jennessa Trigo, Myrene Galas, MD   benztropine (COGENTIN) 0.5 MG tablet Take 1 tablet (0.5 mg total) by mouth 2 (two) times daily. 60 tablet Kaylee Trivett, Myrene Galas, MD   hydrOXYzine (ATARAX/VISTARIL) 25 MG tablet Take 1 tablet (25 mg total) by mouth every 8 (eight) hours as needed for anxiety. 60 tablet Elray Dains, Myrene Galas, MD     PDMP not reviewed this encounter.   Chase Picket, MD 07/21/19 1537

## 2019-07-21 NOTE — ED Notes (Signed)
Bed: UCTR Expected date:  Expected time:  Means of arrival:  Comments: SICK ROOMS 8-10 

## 2019-07-21 NOTE — ED Triage Notes (Addendum)
Pt is here needing a rx refill on Benztropine 0.5MG , Haloperidol 5MG , Hydroxyzine- 25MG , pt ran out 2 weeks ago. Pt's PCP is at the hospital([facility]), states he [facility] ED [facility], pt states he wants to talk to a counselor or therapist, I stated the provider can assist him further.

## 2019-11-23 ENCOUNTER — Other Ambulatory Visit: Payer: Self-pay

## 2019-11-23 ENCOUNTER — Encounter (HOSPITAL_COMMUNITY): Payer: Self-pay

## 2019-11-23 ENCOUNTER — Ambulatory Visit (HOSPITAL_COMMUNITY)
Admission: EM | Admit: 2019-11-23 | Discharge: 2019-11-23 | Discharge status: Against Medical Advice | Payer: Medicare Other

## 2019-11-23 DIAGNOSIS — R0602 Shortness of breath: Secondary | ICD-10-CM | POA: Diagnosis not present

## 2019-11-23 DIAGNOSIS — F209 Schizophrenia, unspecified: Secondary | ICD-10-CM

## 2019-11-23 NOTE — ED Notes (Signed)
Patient continuously leaves room. States he does not want chest xray and is ready to go. Has called for a ride and states he is leaving.
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
## 2019-11-23 NOTE — ED Notes (Signed)
Updated provider on patients status.

## 2019-11-23 NOTE — ED Provider Notes (Addendum)
[facility]   MRN: [id number] DOB: [birth year]  Subjective:   Joshua Davila is a 39 y.o. male presenting for 2-day history of acute onset difficulty breathing, heaviness in his chest.  Patient states that he also needs help setting up his psychiatric care.  Right now he does not have anyone to help him.  Denies drug use, alcohol use.  Patient is a smoker, smokes about 1 pack/day.  He has longstanding history of multi drug use. He is not taking any medication, ran out of his medications. Was requesting information to set up a psychiatric appt.   No current facility-administered medications for this encounter.  Current Outpatient Medications:  .  benztropine (COGENTIN) 0.5 MG tablet, Take 1 tablet (0.5 mg total) by mouth 2 (two) times daily., Disp: 60 tablet, Rfl: 1 .  haloperidol (HALDOL) 5 MG tablet, Take 1 tablet (5 mg total) by mouth 2 (two) times daily., Disp: 60 tablet, Rfl: 1 .  hydrOXYzine (ATARAX/VISTARIL) 25 MG tablet, Take 1 tablet (25 mg total) by mouth every 8 (eight) hours as needed for anxiety., Disp: 60 tablet, Rfl: 1   Allergies  Allergen Reactions  . Penicillins Swelling, Rash and Other (See Comments)    Reaction:  Facial swelling Has patient had a PCN reaction causing immediate rash, facial/tongue/throat swelling, SOB or lightheadedness with hypotension: Yes Has patient had a PCN reaction causing severe rash involving mucus membranes or skin necrosis: No Has patient had a PCN reaction that required hospitalization No Has patient had a PCN reaction occurring within the last 10 years: No If all of the above answers are "NO", then may proceed with Cephalosporin use.  . Sulfa Antibiotics Rash    Past Medical History:  Diagnosis Date  . Depression   . Gender identity disorder   . Liver disorder   . PTSD (post-traumatic stress disorder)   . Schizoaffective disorder (HCC)      History reviewed. No pertinent surgical history.  Family History  Problem Relation  Age of Onset  . Multiple sclerosis Mother   . Mental illness Neg Hx     Social History   Tobacco Use  . Smoking status: Current Every Day Smoker    Packs/day: 1.00    Types: Cigarettes  . Smokeless tobacco: Never Used  Substance Use Topics  . Alcohol use: Yes    Comment: occasional  . Drug use: Yes    Types: Cocaine, Methamphetamines, Marijuana    Comment: History of methamphetamines and marijuana    ROS   Objective:   Vitals: BP 122/71   Pulse 91   Temp 98.2 F (36.8 C)   Resp 18   SpO2 100%   Physical Exam Constitutional:      General: He is not in acute distress.    Appearance: Normal appearance. He is well-developed. He is not ill-appearing, toxic-appearing or diaphoretic.     Comments: Appears lethargic.  HENT:     Head: Normocephalic and atraumatic.     Right Ear: External ear normal.     Left Ear: External ear normal.     Nose: Nose normal.     Mouth/Throat:     Mouth: Mucous membranes are moist.     Pharynx: Oropharynx is clear.  Eyes:     General: No scleral icterus.    Extraocular Movements: Extraocular movements intact.     Pupils: Pupils are equal, round, and reactive to light.  Cardiovascular:     Rate and Rhythm: Normal rate and regular rhythm.  Heart sounds: Normal heart sounds. No murmur heard.  No friction rub. No gallop.   Pulmonary:     Effort: Pulmonary effort is normal. No respiratory distress.     Breath sounds: No stridor. Examination of the right-middle field reveals decreased breath sounds. Examination of the left-middle field reveals decreased breath sounds. Examination of the right-lower field reveals decreased breath sounds. Examination of the left-lower field reveals decreased breath sounds. Decreased breath sounds present. No wheezing, rhonchi or rales.  Neurological:     Mental Status: He is alert and oriented to person, place, and time.  Psychiatric:        Attention and Perception: He is attentive. He does not perceive  auditory or visual hallucinations.        Mood and Affect: Mood normal. Affect is flat.        Speech: He is communicative. Speech is slurred. Speech is not rapid and pressured, delayed or tangential.        Behavior: Behavior is slowed. Behavior is not agitated or aggressive.        Thought Content: Thought content normal. Thought content does not include homicidal or suicidal ideation.     Assessment and Plan :   PDMP not reviewed this encounter.  1. Shortness of breath   2. Schizophrenia, unspecified type (HCC)     I initially evaluated patient and recommended chest x-ray given decreased lung sounds, shortness of breath.  Patient is a smoker and suspected these were related, possibly has COPD.  My intention was to help him with the oral steroid course and albuterol inhaler.  Unfortunately patient left AMA, after initially agreeing to the chest x-ray he change his mind and left the clinic.  I discussed with him getting his mental health set up through the [place] [facility].  I was then able to give him the information prior to him leaving.   Wallis Bamberg, PA-C 11/23/19 1457   Patient returned to clinic. He was agitated and aggressive. I offered to help him by finishing his visit but he was angry about his [employer] ride. Patient was becoming more unstable in the lobby including pacing, yelling and animated hand movements. At that point security was called by Francine Graven. Recommended safe transport to [facility] ER or behavioral health for urgent intervention of his acute mental state.    Wallis Bamberg, PA-C 11/23/19 1500

## 2019-11-25 ENCOUNTER — Emergency Department (HOSPITAL_COMMUNITY)
Admission: EM | Admit: 2019-11-25 | Discharge: 2019-11-26 | Disposition: A | Discharge status: Other Acute Inpatient Hospital | Payer: Medicare Other | Attending: Emergency Medicine | Admitting: Emergency Medicine

## 2019-11-25 ENCOUNTER — Other Ambulatory Visit: Payer: Self-pay

## 2019-11-25 DIAGNOSIS — Z046 Encounter for general psychiatric examination, requested by authority: Secondary | ICD-10-CM

## 2019-11-25 DIAGNOSIS — Z79899 Other long term (current) drug therapy: Secondary | ICD-10-CM | POA: Diagnosis not present

## 2019-11-25 DIAGNOSIS — F1910 Other psychoactive substance abuse, uncomplicated: Secondary | ICD-10-CM | POA: Diagnosis present

## 2019-11-25 DIAGNOSIS — F17210 Nicotine dependence, cigarettes, uncomplicated: Secondary | ICD-10-CM | POA: Insufficient documentation

## 2019-11-25 DIAGNOSIS — F250 Schizoaffective disorder, bipolar type: Secondary | ICD-10-CM | POA: Diagnosis present

## 2019-11-25 DIAGNOSIS — F23 Brief psychotic disorder: Secondary | ICD-10-CM | POA: Diagnosis present

## 2019-11-25 DIAGNOSIS — R441 Visual hallucinations: Secondary | ICD-10-CM | POA: Insufficient documentation

## 2019-11-25 DIAGNOSIS — F1220 Cannabis dependence, uncomplicated: Secondary | ICD-10-CM | POA: Diagnosis present

## 2019-11-25 DIAGNOSIS — Z20822 Contact with and (suspected) exposure to covid-19: Secondary | ICD-10-CM | POA: Insufficient documentation

## 2019-11-25 DIAGNOSIS — F19929 Other psychoactive substance use, unspecified with intoxication, unspecified: Secondary | ICD-10-CM | POA: Diagnosis present

## 2019-11-25 DIAGNOSIS — F1290 Cannabis use, unspecified, uncomplicated: Secondary | ICD-10-CM | POA: Diagnosis not present

## 2019-11-25 DIAGNOSIS — F649 Gender identity disorder, unspecified: Secondary | ICD-10-CM | POA: Diagnosis present

## 2019-11-25 DIAGNOSIS — F4310 Post-traumatic stress disorder, unspecified: Secondary | ICD-10-CM | POA: Diagnosis present

## 2019-11-25 DIAGNOSIS — F1021 Alcohol dependence, in remission: Secondary | ICD-10-CM

## 2019-11-25 DIAGNOSIS — R4689 Other symptoms and signs involving appearance and behavior: Secondary | ICD-10-CM | POA: Diagnosis present

## 2019-11-25 DIAGNOSIS — R440 Auditory hallucinations: Secondary | ICD-10-CM | POA: Insufficient documentation

## 2019-11-25 DIAGNOSIS — F1320 Sedative, hypnotic or anxiolytic dependence, uncomplicated: Secondary | ICD-10-CM | POA: Diagnosis present

## 2019-11-25 DIAGNOSIS — F259 Schizoaffective disorder, unspecified: Secondary | ICD-10-CM | POA: Diagnosis present

## 2019-11-25 DIAGNOSIS — F19950 Other psychoactive substance use, unspecified with psychoactive substance-induced psychotic disorder with delusions: Secondary | ICD-10-CM | POA: Diagnosis present

## 2019-11-25 DIAGNOSIS — F1994 Other psychoactive substance use, unspecified with psychoactive substance-induced mood disorder: Secondary | ICD-10-CM | POA: Diagnosis present

## 2019-11-25 DIAGNOSIS — F1520 Other stimulant dependence, uncomplicated: Secondary | ICD-10-CM | POA: Diagnosis present

## 2019-11-25 DIAGNOSIS — F1120 Opioid dependence, uncomplicated: Secondary | ICD-10-CM | POA: Diagnosis present

## 2019-11-25 DIAGNOSIS — R443 Hallucinations, unspecified: Secondary | ICD-10-CM

## 2019-11-25 LAB — COMPREHENSIVE METABOLIC PANEL
ALT: 53 U/L — ABNORMAL HIGH (ref 0–44)
AST: 58 U/L — ABNORMAL HIGH (ref 15–41)
Albumin: 4.4 g/dL (ref 3.5–5.0)
Alkaline Phosphatase: 58 U/L (ref 38–126)
Anion gap: 12 (ref 5–15)
BUN: 14 mg/dL (ref 6–20)
CO2: 23 mmol/L (ref 22–32)
Calcium: 9.5 mg/dL (ref 8.9–10.3)
Chloride: 101 mmol/L (ref 98–111)
Creatinine, Ser: 0.89 mg/dL (ref 0.61–1.24)
GFR calc Af Amer: >60 mL/min (ref >60)
GFR calc non Af Amer: >60 mL/min (ref >60)
Glucose, Bld: 98 mg/dL (ref 70–99)
Potassium: 4.2 mmol/L (ref 3.5–5.1)
Sodium: 136 mmol/L (ref 135–145)
Total Bilirubin: 0.8 mg/dL (ref 0.3–1.2)
Total Protein: 7.7 g/dL (ref 6.5–8.1)

## 2019-11-25 LAB — RAPID URINE DRUG SCREEN, HOSP PERFORMED
Amphetamines: POSITIVE — AB
Barbiturates: NOT DETECTED
Benzodiazepines: NOT DETECTED
Cocaine: NOT DETECTED
Opiates: NOT DETECTED
Tetrahydrocannabinol: NOT DETECTED

## 2019-11-25 LAB — CBC
HCT: 41.9 % (ref 39.0–52.0)
Hemoglobin: 14 g/dL (ref 13.0–17.0)
MCH: 28.5 pg (ref 26.0–34.0)
MCHC: 33.4 g/dL (ref 30.0–36.0)
MCV: 85.3 fL (ref 80.0–100.0)
Platelets: 274 10*3/uL (ref 150–400)
RBC: 4.91 MIL/uL (ref 4.22–5.81)
RDW: 13.8 % (ref 11.5–15.5)
WBC: 7.5 10*3/uL (ref 4.0–10.5)
nRBC: 0 % (ref 0.0–0.2)

## 2019-11-25 LAB — ACETAMINOPHEN LEVEL: Acetaminophen (Tylenol), Serum: <10 ug/mL — ABNORMAL LOW (ref 10–30)

## 2019-11-25 LAB — ETHANOL: Alcohol, Ethyl (B): <10 mg/dL (ref <10)

## 2019-11-25 LAB — SALICYLATE LEVEL: Salicylate Lvl: <7 mg/dL — ABNORMAL LOW (ref 7.0–30.0)

## 2019-11-25 NOTE — ED Triage Notes (Signed)
Pt presents to ED POV. Pt c/o "feeling like he woke up from a coma." Pt experiencing AVH and delusions. Pt reports he has none of his psych meds and has not been taking them. Denies SI/HI

## 2019-11-26 ENCOUNTER — Encounter (HOSPITAL_COMMUNITY): Payer: Self-pay | Admitting: Emergency Medicine

## 2019-11-26 ENCOUNTER — Other Ambulatory Visit: Payer: Self-pay

## 2019-11-26 ENCOUNTER — Ambulatory Visit (HOSPITAL_COMMUNITY)
Admission: EM | Admit: 2019-11-26 | Discharge: 2019-11-27 | Disposition: A | Discharge status: Home / Self Care | Payer: Medicare Other | Attending: Behavioral Health | Admitting: Behavioral Health

## 2019-11-26 DIAGNOSIS — F1994 Other psychoactive substance use, unspecified with psychoactive substance-induced mood disorder: Secondary | ICD-10-CM

## 2019-11-26 DIAGNOSIS — F329 Major depressive disorder, single episode, unspecified: Secondary | ICD-10-CM | POA: Insufficient documentation

## 2019-11-26 DIAGNOSIS — F250 Schizoaffective disorder, bipolar type: Secondary | ICD-10-CM

## 2019-11-26 DIAGNOSIS — F23 Brief psychotic disorder: Secondary | ICD-10-CM

## 2019-11-26 DIAGNOSIS — Z79899 Other long term (current) drug therapy: Secondary | ICD-10-CM | POA: Diagnosis not present

## 2019-11-26 DIAGNOSIS — F4310 Post-traumatic stress disorder, unspecified: Secondary | ICD-10-CM | POA: Insufficient documentation

## 2019-11-26 DIAGNOSIS — F1990 Other psychoactive substance use, unspecified, uncomplicated: Secondary | ICD-10-CM | POA: Insufficient documentation

## 2019-11-26 DIAGNOSIS — F649 Gender identity disorder, unspecified: Secondary | ICD-10-CM | POA: Insufficient documentation

## 2019-11-26 DIAGNOSIS — F17210 Nicotine dependence, cigarettes, uncomplicated: Secondary | ICD-10-CM | POA: Insufficient documentation

## 2019-11-26 DIAGNOSIS — Z881 Allergy status to other antibiotic agents status: Secondary | ICD-10-CM | POA: Insufficient documentation

## 2019-11-26 DIAGNOSIS — F1520 Other stimulant dependence, uncomplicated: Secondary | ICD-10-CM | POA: Diagnosis not present

## 2019-11-26 DIAGNOSIS — Z880 Allergy status to penicillin: Secondary | ICD-10-CM | POA: Diagnosis not present

## 2019-11-26 DIAGNOSIS — K769 Liver disease, unspecified: Secondary | ICD-10-CM | POA: Insufficient documentation

## 2019-11-26 DIAGNOSIS — F1590 Other stimulant use, unspecified, uncomplicated: Secondary | ICD-10-CM | POA: Diagnosis not present

## 2019-11-26 LAB — SARS CORONAVIRUS 2 BY RT PCR (HOSPITAL ORDER, PERFORMED IN ~~LOC~~ HOSPITAL LAB): SARS Coronavirus 2: NEGATIVE

## 2019-11-26 MED ORDER — ALUM & MAG HYDROXIDE-SIMETH 200-200-20 MG/5ML PO SUSP: 30.0000 mL | ORAL | Status: DC | PRN

## 2019-11-26 MED ORDER — HYDROXYZINE HCL 25 MG PO TABS: 25.0000 mg | ORAL_TABLET | Freq: Three times a day (TID) | ORAL | Status: DC | PRN

## 2019-11-26 MED ORDER — BENZTROPINE MESYLATE 0.5 MG PO TABS
0.5000 mg | ORAL_TABLET | Freq: Two times a day (BID) | ORAL | Status: DC
  Administered 2019-11-26 – 2019-11-27 (×3): 0.5 mg via ORAL
  Filled 2019-11-26 (×3): qty 1

## 2019-11-26 MED ORDER — HALOPERIDOL 5 MG PO TABS
5.0000 mg | ORAL_TABLET | Freq: Two times a day (BID) | ORAL | Status: DC
  Administered 2019-11-26 – 2019-11-27 (×3): 5 mg via ORAL
  Filled 2019-11-26 (×3): qty 1

## 2019-11-26 MED ORDER — ACETAMINOPHEN 325 MG PO TABS: 650.0000 mg | ORAL_TABLET | Freq: Four times a day (QID) | ORAL | Status: DC | PRN

## 2019-11-26 MED ORDER — MAGNESIUM HYDROXIDE 400 MG/5ML PO SUSP: 30.0000 mL | Freq: Every day | ORAL | Status: DC | PRN

## 2019-11-26 NOTE — ED Notes (Signed)
And visual hallucinations for the last few weeks.  Denies SI or HI.  Pt not taking meds for the last 3 months and needs assistance with getting his meds.  Last meth use yesterday.  Skin search completed, monitoring for safety.

## 2019-11-26 NOTE — ED Provider Notes (Signed)
Behavioral Health Admission H&P [facility] & O[facility])  Date: 11/26/19 Patient Name: Joshua Davila MRN: [id number] Chief Complaint:  Chief Complaint  Patient presents with   Hallucinations      Diagnoses:  Final diagnoses:  None  HPI: Joshua GrimesJames B Canada is a 39 y.o. male with a history of schizoaffective disorder, polysubstance use disorder, which presented to [facility] ED with a chief complaint of hallucinations and [facility] with [facility] complaint. The patient denies having auditory hallucinations but admits to visual hallucinations for the last few weeks. States that the visual hallucinations "look like a laser light show."  The patient denies SI or HI.  He has a history of methamphetamine use; his last use was yesterday. He denies other illicit or recreational substance use. He denies alcohol use. He endorses tobacco use.  PHQ 2-9:     [facility] Admission (Discharged) from OP Visit from 11/03/2018 in BEHAVIORAL HEALTH OBSERVATION UNIT  C-SSRS RISK CATEGORY No Risk No Risk No Risk       Total Time spent with patient: 20 minutes  Musculoskeletal  Strength & Muscle Tone: within normal limits Gait & Station: normal Patient leans: N/A  Psychiatric Specialty Exam  Presentation General Appearance: Bizarre;Disheveled  Eye Contact:Fair  Speech:Clear and Coherent  Speech Volume:Decreased  Handedness:Right   Mood and Affect  Mood:Anxious;Hopeless  Affect:Appropriate;Blunt;Depressed   Thought Process  Thought Processes:Goal Directed  Descriptions of Associations:Circumstantial  Orientation:Full (Time, Place and Person)  Thought Content:Illogical  Hallucinations:Hallucinations: Visual Description of Visual Hallucinations: Ray of lights  Ideas of Reference:Other (comment)  Suicidal Thoughts:Suicidal Thoughts: No  Homicidal  Thoughts:Homicidal Thoughts: No   Sensorium  Memory:Immediate Good;Recent Good;Remote Good  Judgment:Impaired  Insight:Lacking   Executive Functions  Concentration:Fair  Attention Span:Fair  Recall:Fair  Fund of Knowledge:Fair  Language:Fair   Psychomotor Activity  Psychomotor Activity:Psychomotor Activity: Normal   Assets  Assets:Communication Skills;Desire for Improvement;Housing;Physical Health;Social Support   Sleep  Sleep:Number of Hours of Sleep: 6   Physical Exam ROS  Blood pressure 115/76, pulse 87, temperature 98.6 F (37 C), temperature source Oral, resp. rate 20, height 6' (1.829 m), weight 81.2 kg, SpO2 99 %. Body mass index is 24.28 kg/m.  Past Psychiatric History:  Schizoaffective disorder (HCC) PTSD (post-traumatic stress disorder)  Gender identity disorder  Is the patient at risk to self? No  Has the patient been a risk to self in the past 6 months? No .    Has the patient been a risk to self within the distant past? No   Is the patient a risk to others? No   Has the patient been a risk to others in the past 6 months? No   Has the patient been a risk to others within the distant past? No   Past Medical History:  Past Medical History:  Diagnosis Date   Depression    Gender identity disorder    Liver disorder    PTSD (post-traumatic stress disorder)    Schizoaffective disorder (HCC)    History reviewed. No pertinent surgical history.  Family History:  Family History  Problem Relation Age of Onset   Multiple sclerosis Mother    Mental illness Neg Hx     Social History:  Social History   Socioeconomic History   Marital status: Significant Other    Spouse name: [name]   Number of children: Not on file  Years of education: Not on file   Highest education level: Not on file  Occupational History   Not on file  Tobacco Use   Smoking status: Current Every Day Smoker    Packs/day: 1.00    Types: Cigarettes    Smokeless tobacco: Never Used  Substance and Sexual Activity   Alcohol use: Yes    Comment: occasional   Drug use: Yes    Types: Cocaine, Methamphetamines, Marijuana    Comment: History of methamphetamines and marijuana   Sexual activity: Not Currently  Other Topics Concern   Not on file  Social History Narrative   Pt lives in [place].  Per history, Pt lives with significant other [facility] [phone]).  [name].   Social Determinants of Health   Financial Resource Strain:    Difficulty of Paying Living Expenses: Not on file  Food Insecurity:    Worried About Programme researcher, broadcasting/film/video in the Last Year: Not on file   [employer] of Food in the Last Year: Not on file  Transportation Needs:    Lack of Transportation (Medical): Not on file   Lack of Transportation (Non-Medical): Not on file  Physical Activity:    Days of Exercise per Week: Not on file   Minutes of Exercise per Session: Not on file  Stress:    Feeling of Stress : Not on file  Social Connections:    Frequency of Communication with Friends and Family: Not on file   Frequency of Social Gatherings with Friends and Family: Not on file   Attends Religious Services: Not on file   Active Member of Clubs or Organizations: Not on file   Attends Banker Meetings: Not on file   Marital Status: Not on file  Intimate Partner Violence:    Fear of Current or Ex-Partner: Not on file   Emotionally Abused: Not on file   Physically Abused: Not on file   Sexually Abused: Not on file    SDOH:  SDOH Screenings   Alcohol Screen:    Last Alcohol Screening Score (AUDIT): Not on file  Depression (PHQ2-9):    PHQ-2 Score: Not on file  Financial Resource Strain:    Difficulty of Paying Living Expenses: Not on file  Food Insecurity:    Worried About Programme researcher, broadcasting/film/video in the Last Year: Not on file   [employer] of Food in the Last Year: Not on file   Housing:    Last Housing Risk Score: Not on file  Physical Activity:    Days of Exercise per Week: Not on file   Minutes of Exercise per Session: Not on file  Social Connections:    Frequency of Communication with Friends and Family: Not on file   Frequency of Social Gatherings with Friends and Family: Not on file   Attends Religious Services: Not on file   Active Member of Clubs or Organizations: Not on file   Attends Banker Meetings: Not on file   Marital Status: Not on file  Stress:    Feeling of Stress : Not on file  Tobacco Use: High Risk   Smoking Tobacco Use: Current Every Day Smoker   Smokeless Tobacco Use: Never Used  Transportation Needs:    Freight forwarder (Medical): Not on file   Lack of Transportation (Non-Medical): Not on file    Last Labs:  Admission on 11/25/2019, Discharged on 11/26/2019  Component Date Value Ref Range  Status   Sodium 11/25/2019 136  135 - 145 mmol/L Final   Potassium 11/25/2019 4.2  3.5 - 5.1 mmol/L Final   Chloride 11/25/2019 101  98 - 111 mmol/L Final   CO2 11/25/2019 23  22 - 32 mmol/L Final   Glucose, Bld 11/25/2019 98  70 - 99 mg/dL Final   Glucose reference range applies only to samples taken after fasting for at least 8 hours.   BUN 11/25/2019 14  6 - 20 mg/dL Final   Creatinine, Ser 11/25/2019 0.89  0.61 - 1.24 mg/dL Final   Calcium 24/58/0998 9.5  8.9 - 10.3 mg/dL Final   Total Protein 33/82/5053 7.7  6.5 - 8.1 g/dL Final   Albumin 97/67/3419 4.4  3.5 - 5.0 g/dL Final   AST 37/90/2409 58* 15 - 41 U/L Final   ALT 11/25/2019 53* 0 - 44 U/L Final   Alkaline Phosphatase 11/25/2019 58  38 - 126 U/L Final   Total Bilirubin 11/25/2019 0.8  0.3 - 1.2 mg/dL Final   GFR calc non Af Amer 11/25/2019 >60  >60 mL/min Final   GFR calc Af Amer 11/25/2019 >60  >60 mL/min Final   Anion gap 11/25/2019 12  5 - 15 Final   Performed at [facility], [address], [place], [place] [address]    Alcohol, Ethyl (B) 11/25/2019 <10  <10 mg/dL Final   Comment: (NOTE) Lowest detectable limit for serum alcohol is 10 mg/dL.  For medical purposes only. Performed at [facility], [address] [place], [place] [address]    Salicylate Lvl 11/25/2019 <7.0* 7.0 - 30.0 mg/dL Final   Performed at [facility], [address], [place], [place] [address]   Acetaminophen (Tylenol), Serum 11/25/2019 <10* 10 - 30 ug/mL Final   Comment: (NOTE) Therapeutic concentrations vary significantly. A range of 10-30 ug/mL  may be an effective concentration for many patients. However, some  are best treated at concentrations outside of this range. Acetaminophen concentrations >150 ug/mL at 4 hours after ingestion  and >50 ug/mL at 12 hours after ingestion are often associated with  toxic reactions.  Performed at [facility], [address] [place], [place] [address]    WBC 11/25/2019 7.5  4.0 - 10.5 K/uL Final   RBC 11/25/2019 4.91  4.22 - 5.81 MIL/uL Final   Hemoglobin 11/25/2019 14.0  13.0 - 17.0 g/dL Final   HCT 97/98/9211 41.9  39 - 52 % Final   MCV 11/25/2019 85.3  80.0 - 100.0 fL Final   MCH 11/25/2019 28.5  26.0 - 34.0 pg Final   MCHC 11/25/2019 33.4  30.0 - 36.0 g/dL Final   RDW 94/17/4081 13.8  11.5 - 15.5 % Final   Platelets 11/25/2019 274  150 - 400 K/uL Final   nRBC 11/25/2019 0.0  0.0 - 0.2 % Final   Performed at [facility], [address], [place], [place] [address]   Opiates 11/25/2019 NONE DETECTED  NONE DETECTED Final   Cocaine 11/25/2019 NONE DETECTED  NONE DETECTED Final   Benzodiazepines 11/25/2019 NONE DETECTED  NONE DETECTED Final   Amphetamines 11/25/2019 POSITIVE* NONE DETECTED Final   Tetrahydrocannabinol 11/25/2019 NONE DETECTED  NONE DETECTED Final   Barbiturates 11/25/2019 NONE DETECTED  NONE DETECTED Final   Comment: (NOTE) DRUG SCREEN FOR MEDICAL PURPOSES ONLY.  IF CONFIRMATION IS NEEDED FOR ANY PURPOSE, NOTIFY  LAB WITHIN 5 DAYS.  LOWEST DETECTABLE LIMITS FOR URINE DRUG SCREEN Drug Class  Cutoff (ng/mL) Amphetamine and metabolites    1000 Barbiturate and metabolites    200 Benzodiazepine                 200 Tricyclics and metabolites     300 Opiates and metabolites        300 Cocaine and metabolites        300 THC                            50 Performed at [facility], [address] [place], [place] [address]    SARS Coronavirus 2 11/26/2019 NEGATIVE  NEGATIVE Final   Comment: (NOTE) SARS-CoV-2 target nucleic acids are NOT DETECTED.  The SARS-CoV-2 RNA is generally detectable in upper and lower respiratory specimens during the acute phase of infection. The lowest concentration of SARS-CoV-2 viral copies this assay can detect is 250 copies / mL. A negative result does not preclude SARS-CoV-2 infection and should not be used as the sole basis for treatment or other patient management decisions.  A negative result may occur with improper specimen collection / handling, submission of specimen other than nasopharyngeal swab, presence of viral mutation(s) within the areas targeted by this assay, and inadequate number of viral copies (<250 copies / mL). A negative result must be combined with clinical observations, patient history, and epidemiological information.  Fact Sheet for Patients:   BoilerBrush.com.cy  Fact Sheet for Healthcare Providers: https://pope.com/  This test is not yet approved or                           cleared by the [place] FDA and has been authorized for detection and/or diagnosis of SARS-CoV-2 by FDA under an Emergency Use Authorization (EUA).  This EUA will remain in effect (meaning this test can be used) for the duration of the COVID-19 declaration under Section 564(b)(1) of the Act, 21 U.S.C. section 360bbb-3(b)(1), unless the authorization is terminated or revoked  sooner.  Performed at [facility], [address] [place], [place] [address]     Allergies: Penicillins and Sulfa antibiotics  PTA Medications: (Not in a hospital admission)   Medical Decision Making  The patient will remain under observation overnight and reassess in the A.M. to determine if he meets the criteria for psychiatric inpatient admission or can be discharged.    Recommendations  Based on my evaluation the patient appears to have an emergency medical condition for which I recommend the patient be transferred to the emergency department for further evaluation.  Gillermo Murdoch, NP 11/26/19  4:53 AM

## 2019-11-26 NOTE — ED Notes (Signed)
Patient was given sandwich and soda.

## 2019-11-26 NOTE — ED Notes (Signed)
Pt A&O x 4, sleeping at present, no distress noted, calm & cooperative.  Monitoring for safety. °

## 2019-11-26 NOTE — ED Notes (Signed)
Pt sleeping at present, no distress noted, calm & cooperative, monitoring for safety. °

## 2019-11-26 NOTE — ED Triage Notes (Signed)
Presents with hallucinations and off meds.

## 2019-11-26 NOTE — BH Assessment (Addendum)
Comprehensive Clinical Assessment (CCA) Note  11/26/2019 Joshua Davila [id number]  Visit Diagnosis: F25.0 Schizoaffective disorder, Bipolar type   ICD-10-CM   1. Brief psychotic disorder (HCC)  F23     Disposition: Per [name], NP recommended continuous assessment. Pt will be reassessed in the am by psych.   Joshua Davila is a 39 y.o male who presents voluntarily to [facility] via safe transport. Pt reported, he has been sick and feeling like he is going to die. Pt reported, he is sick due to poison from the house and/or dope. Pt denies any active SI, HI and access to means. Pt reported, active AVH with no commands.  Pt reported, hx of methamphetamine use. Pt denies any inpatient treatment, but has been seen multiple times at [facility]. Pt denies any therapist, psychiatrist and/or medication involvement.    Pt had avoided eye contact with a slurred and blocked speech. Pt had inattentive attention with a scattered concentration. Pt had irritable affect, mood and attitude towards counselor. Pt thought content was appropriate to mood and circumstances.    CCA Screening, Triage and Referral (STR)  Patient Reported Information How did you hear about [place]? No data recorded Referral name: No data recorded Referral phone number: No data recorded  Whom do you see for routine medical problems? No data recorded Practice/Facility Name: No data recorded Practice/Facility Phone Number: No data recorded Name of Contact: No data recorded Contact Number: No data recorded Contact Fax Number: No data recorded Prescriber Name: No data recorded Prescriber Address (if known): No data recorded  What Is the Reason for Your Visit/Call Today? Pt reported, he has been sick and feeling like he's going to die. Pt reported, he is getting sick from either poison from the house or from the dope.  How Long Has This Been Causing You Problems? 1-6 months  What Do You Feel Would Help You the Most Today? No data  recorded  Have You Recently Been in Any Inpatient Treatment (Hospital/Detox/Crisis Center/28-Day Program)? Yes  Name/Location of Program/Hospital:[facility]  How Long Were You There? No data recorded When Were You Discharged? No data recorded  Have You Ever Received Services From [facility] Before? Yes  Who Do You See at [facility]? No data recorded  Have You Recently Had Any Thoughts About Hurting Yourself? No data recorded Are You Planning to Commit Suicide/Harm Yourself At This time? No data recorded  Have you Recently Had Thoughts About Hurting Someone Joshua Davila? No  Explanation: No data recorded  Have You Used Any Alcohol or Drugs in the Past 24 Hours? No (Pt reported, last use for 2-3 days ago.)  How Long Ago Did You Use Drugs or Alcohol? No data recorded What Did You Use and How Much? No data recorded  Do You Currently Have a Therapist/Psychiatrist? No  Name of Therapist/Psychiatrist: No data recorded  Have You Been Recently Discharged From Any Office Practice or Programs? No data recorded Explanation of Discharge From Practice/Program: No data recorded    CCA Screening Triage Referral Assessment Type of Contact: Face-to-Face  Is this Initial or Reassessment? No data recorded Date Telepsych consult ordered in [facility]:  No data recorded Time Telepsych consult ordered in [facility]:  No data recorded  Patient Reported Information Reviewed? Yes  Patient Left Without Being Seen? No data recorded Reason for Not Completing Assessment: Pt is in hallway.   Collateral Involvement: No data recorded  Does Patient Have a Court Appointed Legal Guardian? No data recorded Name and Contact of  Legal Guardian: Unknown  If Minor and Not Living with Parent(s), Who has Custody? NA  Is CPS involved or ever been involved? Never  Is APS involved or ever been involved? Never   Patient Determined To Be At Risk for Harm To Self or Others Based on Review of Patient Reported Information or  Presenting Complaint? No  Method: No data recorded Availability of Means: No data recorded Intent: No data recorded Notification Required: No data recorded Additional Information for Danger to Others Potential: No data recorded Additional Comments for Danger to Others Potential: No data recorded Are There Guns or Other Weapons in Your Home? No data recorded Types of Guns/Weapons: No data recorded Are These Weapons Safely Secured?                            No data recorded Who Could Verify You Are Able To Have These Secured: No data recorded Do You Have any Outstanding Charges, Pending Court Dates, Parole/Probation? No data recorded Contacted To Inform of Risk of Harm To Self or Others: No data recorded  Location of Assessment: [facility] Services   Does Patient Present under Involuntary Commitment? No  IVC Papers Initial File Date: No data recorded  [place] of Residence: [place]   Patient Currently Receiving the Following Services: Not Receiving Services   Determination of Need: Urgent (48 hours)   Options For Referral: Medication Management;Outpatient Therapy     CCA Biopsychosocial  Intake/Chief Complaint:  CCA Intake With Chief Complaint CCA Part Two Date: 11/26/19 CCA Part Two Time: 0615 Chief Complaint/Presenting Problem: Pt reported, he has been sick and feeling like he's going to die. Pt reported, he is getting sick from either poison from the house or from the dope. Patient's Currently Reported Symptoms/Problems: sick Individual's Strengths: N/A Individual's Preferences: N/A Individual's Abilities: N/A Type of Services Patient Feels Are Needed: N/A Initial Clinical Notes/Concerns: N/A  Mental Health Symptoms Depression:  Depression: Change in energy/activity, Difficulty Concentrating, Fatigue, Irritability, Sleep (too much or little), Tearfulness, Worthlessness, Duration of symptoms greater than two weeks  Mania:  Mania: Change in energy/activity,  Irritability  Anxiety:   Anxiety: Difficulty concentrating, Fatigue, Irritability, Restlessness  Psychosis:  Psychosis:  (Pt reported, active AVH with no commands.)  Trauma:  Trauma: N/A  Obsessions:  Obsessions: N/A  Compulsions:  Compulsions: N/A  Inattention:  Inattention: N/A  Hyperactivity/Impulsivity:  Hyperactivity/Impulsivity: N/A  Oppositional/Defiant Behaviors:  Oppositional/Defiant Behaviors: N/A  Emotional Irregularity:  Emotional Irregularity: Intense/inappropriate anger, Mood lability  Other Mood/Personality Symptoms:  Other Mood/Personality Symptoms:  (N/A)   Mental Status Exam Appearance and self-care  Stature:  Stature: Average  Weight:  Weight: Thin  Clothing:  Clothing:  (Pt was in scrubs)  Grooming:  Grooming: Normal  Cosmetic use:  Cosmetic Use: None  Posture/gait:  Posture/Gait: Slumped  Motor activity:  Motor Activity: Not Remarkable  Sensorium  Attention:  Attention: Inattentive  Concentration:  Concentration: Scattered  Orientation:  Orientation: Person, Place  Recall/memory:  Recall/Memory:  (Pt was sleepy and answering questions inappropriate.)  Affect and Mood  Affect:  Affect:  (irritable)  Mood:  Mood: Irritable  Relating  Eye contact:  Eye Contact: Avoided  Facial expression:  Facial Expression: Constricted  Attitude toward examiner:  Attitude Toward Examiner: Irritable  Thought and Language  Speech flow: Speech Flow: Slurred, Blocked  Thought content:  Thought Content: Appropriate to Mood and Circumstances  Preoccupation:  Preoccupations: Other (Comment) (Pt was preoccupied on getting some  sleep)  Hallucinations:  Hallucinations: Auditory, Visual (Pt reported, but no commands.)  Organization:     Company secretary of Knowledge:  Fund of Knowledge:  Industrial/product designer)  Intelligence:  Intelligence:  Industrial/product designer)  Abstraction:  Abstraction:  Industrial/product designer)  Judgement:  Judgement: Impaired, Poor  Reality Testing:  Reality Testing:  (UTA)  Insight:  Insight:  Lacking, Shallow, Poor, Unaware  Decision Making:  Decision Making: Paralyzed  Social Functioning  Social Maturity:  Social Maturity:  Industrial/product designer)  Social Judgement:  Social Judgement:  (UTA)  Stress  Stressors:  Stressors:  (N/A)  Coping Ability:  Coping Ability: Building surveyor Deficits:  Skill Deficits: Self-control, Scientist, physiological  Supports:  Supports:  (Pt reported, I dont remember.)     Religion: Religion/Spirituality Are You A Religious Person?: Yes What is Your Religious Affiliation?: Christian How Might This Affect Treatment?:  (UTA)  Leisure/Recreation: Leisure / Recreation Do You Have Hobbies?: No  Exercise/Diet: Exercise/Diet Do You Exercise?: No Have You Gained or Lost A Significant Amount of Weight in the Past Six Months?: No Do You Follow a Special Diet?: No Do You Have Any Trouble Sleeping?: Yes Explanation of Sleeping Difficulties:  (UTA)   CCA Employment/Education  Employment/Work Situation: Employment / Work Situation Employment situation: Unemployed Patient's job has been impacted by current illness:  (UTA) 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) Where was the patient employed at that time?:  (UTA) Has patient ever been in the [employer]?: Yes (Describe in comment) (Pt reported, yes.)  Education: Education Is Patient Currently Attending School?: No Last Grade Completed:  (Pt reported, masters level) Name of High School:  (UTA) Did You Graduate From [employer]?: Yes Did You Attend College?: Yes (Pt reported, masters level.) What Type of College Degree Do you Have?:  (UTA) Did You Attend Graduate School?: Yes (Pt reported, masters level.) What is Your Geophysicist/field seismologist Degree?:  (UTA) What Was Your Major?:  (UTA) Did You Have Any Special Interests In School?:  (UTA) Did You Have An Individualized Education Program (IIEP):  (UTA) Did You Have Any Difficulty At School?:  (UTA) Patient's Education Has Been Impacted by Current Illness:   (UTA)   CCA Family/Childhood History  Family and Relationship History: Family history Marital status: Married Number of Years Married:  (UTA) What types of issues is patient dealing with in the relationship?:  (UTA) Additional relationship information:  (UTA) Are you sexually active?: No What is your sexual orientation?:  (UTA) Has your sexual activity been affected by drugs, alcohol, medication, or emotional stress?:  (UTA) Does patient have children?: No  Childhood History:  Childhood History By whom was/is the patient raised?:  (UTA) Additional childhood history information:  (UTA) Description of patient's relationship with caregiver when they were a child:  (UTA) Patient's description of current relationship with people who raised him/her:  (UTA) How were you disciplined when you got in trouble as a child/adolescent?:  (UTA) Does patient have siblings?: No Did patient suffer any verbal/emotional/physical/sexual abuse as a child?: No Did patient suffer from severe childhood neglect?: No Has patient ever been sexually abused/assaulted/raped as an adolescent or adult?: No Was the patient ever a victim of a crime or a disaster?: No Witnessed domestic violence?: No Has patient been affected by domestic violence as an adult?: No  Child/Adolescent Assessment:     CCA Substance Use  Alcohol/Drug Use: Alcohol / Drug Use Pain Medications: Pt denies. Prescriptions: Pt denies. Over the Counter: Pt denies. History  of alcohol / drug use?: Yes Longest period of sobriety (when/how long):  (UTA) Negative Consequences of Use:  (UTA) Withdrawal Symptoms:  (UTA) Substance #1 Name of Substance 1: Methamphetamine 1 - Age of First Use: not sure 1 - Amount (size/oz): UTA 1 - Frequency: UTA 1 - Duration: UTA 1 - Last Use / Amount: 2-3 days ago     ASAM's:  Six Dimensions of Multidimensional Assessment  Dimension 1:  Acute Intoxication and/or Withdrawal Potential:   Dimension 1:   Description of individual's past and current experiences of substance use and withdrawal:  (UTA)  Dimension 2:  Biomedical Conditions and Complications:   Dimension 2:  Description of patient's biomedical conditions and  complications:  (UTA)  Dimension 3:  Emotional, Behavioral, or Cognitive Conditions and Complications:  Dimension 3:  Description of emotional, behavioral, or cognitive conditions and complications:  (UTA)  Dimension 4:  Readiness to Change:  Dimension 4:  Description of Readiness to Change criteria:  (UTA)  Dimension 5:  Relapse, Continued use, or Continued Problem Potential:  Dimension 5:  Relapse, continued use, or continued problem potential critiera description:  (UTA)  Dimension 6:  Recovery/Living Environment:  Dimension 6:  Recovery/Iiving environment criteria description:  (UTA)  ASAM Severity Score:    ASAM Recommended Level of Treatment: ASAM Recommended Level of Treatment:  (UTA)   Substance use Disorder (SUD) Substance Use Disorder (SUD)  Checklist Symptoms of Substance Use:  (UTA)  Recommendations for Services/Supports/Treatments: Recommendations for Services/Supports/Treatments Recommendations For Services/Supports/Treatments: Individual Therapy, Medication Management  DSM5 Diagnoses: Patient Active Problem List   Diagnosis Date Noted  . 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 Schizoaffective disorder (HCC) 11/03/2018  . Aggressive behavior   . Alcohol use disorder, severe, in early remission, in controlled environment (HCC) 04/22/2017  . Polysubstance abuse (HCC)   . Schizoaffective disorder, bipolar type (HCC) 10/02/2015  . Involuntary commitment 10/02/2015  . Cannabis use disorder, moderate, dependence (HCC) 09/16/2015  . Opioid use disorder, moderate, dependence (HCC) 09/16/2015  . Substance or medication-induced bipolar and related disorder with onset during intoxication (HCC) 09/16/2015  . Substance-induced psychotic disorder with delusions  (HCC) 09/16/2015  . 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 Benzodiazepine dependence (HCC) 10/04/2013  . PTSD (post-traumatic stress disorder) 10/03/2013  . Substance induced mood disorder (HCC) 10/03/2013  . 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

## 2019-11-26 NOTE — ED Notes (Signed)
Patient denies SI/HI. Patient given support and encouragement. Monitoring continues. 

## 2019-11-26 NOTE — ED Provider Notes (Signed)
[facility] EMERGENCY DEPARTMENT Provider Note   CSN: [id number] Arrival date & time: 11/25/19  1958     History Chief Complaint  Patient presents with  . Psychiatric Evaluation    FUQUAN WILSON is a 39 y.o. male with a history of schizoaffective disorder, polysubstance use disorder who presents to the emergency department with a chief complaint of hallucinations.  The patient reports that he has been having auditory and visual hallucinations for the last few weeks.  States that the visual hallucinations "look like a laser light show."  Denies kill commands with the auditory hallucinations.  Denies SI or HI.  Reports that he has not been taking his medications for the last 3 months, and needs assistance with getting his medications readjusted due to his worsening symptoms.  He has a history of methamphetamine use, last use was yesterday.  He denies other illicit or recreational substance use.  He denies alcohol use.  He endorses tobacco use.  Reports that he is having some pain in his feet from walking a long distance yesterday.  No fever, chills, back pain, chest pain, shortness of breath, cough, abdominal pain, nausea, vomiting, diarrhea.  The history is provided by the patient and medical records. No language interpreter was used.       Past Medical History:  Diagnosis Date  . Depression   . Gender identity disorder   . Liver disorder   . PTSD (post-traumatic stress disorder)   . Schizoaffective disorder [facility])     Patient Active Problem List   Diagnosis Date Noted  . 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 Schizoaffective disorder (HCC) 11/03/2018  . Aggressive behavior   . Alcohol use disorder, severe, in early remission, in controlled environment (HCC) 04/22/2017  . Polysubstance abuse (HCC)   . Schizoaffective disorder, bipolar type (HCC) 10/02/2015  . Involuntary commitment 10/02/2015  . Cannabis use disorder, moderate, dependence (HCC)  09/16/2015  . Opioid use disorder, moderate, dependence (HCC) 09/16/2015  . Substance or medication-induced bipolar and related disorder with onset during intoxication (HCC) 09/16/2015  . 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 Benzodiazepine dependence (HCC) 10/04/2013  . PTSD (post-traumatic stress disorder) 10/03/2013  . Substance induced mood disorder (HCC) 10/03/2013  . 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     Family History  Problem Relation Age of Onset  . Multiple sclerosis Mother   . Mental illness Neg Hx     Social History   Tobacco Use  . Smoking status: Current Every Day Smoker    Packs/day: 1.00    Types: Cigarettes  . Smokeless tobacco: Never Used  Substance Use Topics  . Alcohol use: Yes    Comment: occasional  . Drug use: Yes    Types: Cocaine, Methamphetamines, Marijuana    Comment: History of methamphetamines and marijuana    Home Medications Prior to Admission medications   Medication Sig Start Date End Date Taking? Authorizing Provider  benztropine (COGENTIN) 0.5 MG tablet Take 1 tablet (0.5 mg total) by mouth 2 (two) times daily. 07/21/19   Lamptey, Britta Mccreedy, MD  haloperidol (HALDOL) 5 MG tablet Take 1 tablet (5 mg total) by mouth 2 (two) times daily. 07/21/19   LampteyBritta Mccreedy, MD  hydrOXYzine (ATARAX/VISTARIL) 25 MG tablet Take 1 tablet (25 mg total) by mouth every 8 (eight) hours as needed for anxiety.  07/21/19   LampteyBritta Mccreedy, MD    Allergies    Penicillins and Sulfa antibiotics  Review of Systems   Review of Systems  Constitutional: Negative for appetite change, chills and fever.  HENT: Negative for congestion and sore throat.   Eyes: Negative for visual disturbance.  Respiratory: Negative for shortness of breath and wheezing.   Cardiovascular: Negative for  chest pain and palpitations.  Gastrointestinal: Negative for abdominal pain, diarrhea, nausea and vomiting.  Genitourinary: Negative for dysuria.  Musculoskeletal: Negative for back pain, gait problem, neck pain and neck stiffness.  Skin: Negative for rash and wound.  Allergic/Immunologic: Negative for immunocompromised state.  Neurological: Negative for seizures, syncope, weakness, numbness and headaches.  Psychiatric/Behavioral: Positive for hallucinations. Negative for agitation, confusion, dysphoric mood, self-injury and suicidal ideas.    Physical Exam Updated Vital Signs BP 116/74   Pulse 71   Temp 98.6 F (37 C)   Resp 18   SpO2 100%   Physical Exam Vitals and nursing note reviewed.  Constitutional:      General: He is not in acute distress.    Appearance: He is well-developed. He is not ill-appearing, toxic-appearing or diaphoretic.  HENT:     Head: Normocephalic.  Eyes:     Conjunctiva/sclera: Conjunctivae normal.  Cardiovascular:     Rate and Rhythm: Normal rate and regular rhythm.     Heart sounds: No murmur heard.   Pulmonary:     Effort: Pulmonary effort is normal. No respiratory distress.     Breath sounds: No stridor. No wheezing, rhonchi or rales.  Chest:     Chest wall: No tenderness.  Abdominal:     General: There is no distension.     Palpations: Abdomen is soft. There is no mass.     Tenderness: There is no abdominal tenderness. There is no right CVA tenderness, left CVA tenderness, guarding or rebound.     Hernia: No hernia is present.  Musculoskeletal:     Cervical back: Neck supple.  Skin:    General: Skin is warm and dry.     Comments: Scattered scabs noted to the bilateral arms and legs.  No erythema, induration, warmth, or fluctuance.  No drainage noted.  Neurological:     Mental Status: He is alert.  Psychiatric:        Mood and Affect: Affect is flat.        Speech: Speech normal.        Behavior: Behavior normal. Behavior is  cooperative.        Thought Content: Thought content does not include homicidal or suicidal ideation. Thought content does not include homicidal or suicidal plan.     Comments: He is not actively responding to internal stimuli     ED Results / Procedures / Treatments   Labs (all labs ordered are listed, but only abnormal results are displayed) Labs Reviewed  COMPREHENSIVE METABOLIC PANEL - Abnormal; Notable for the following components:      Result Value   AST 58 (*)    ALT 53 (*)    All other components within normal limits  SALICYLATE LEVEL - Abnormal; Notable for the following components:   Salicylate Lvl <7.0 (*)    All other components within normal limits  ACETAMINOPHEN LEVEL - Abnormal; Notable for the following components:   Acetaminophen (Tylenol), Serum <10 (*)    All other components within normal limits  RAPID URINE DRUG SCREEN, HOSP PERFORMED - Abnormal; Notable for the following components:  Amphetamines POSITIVE (*)    All other components within normal limits  SARS CORONAVIRUS 2 BY RT PCR (HOSPITAL ORDER, PERFORMED IN [facility])  ETHANOL  CBC    EKG None  Radiology No results found.  Procedures Procedures (including critical care time)  Medications Ordered in ED Medications - No data to display  ED Course  I have reviewed the triage vital signs and the nursing notes.  Pertinent labs & imaging results that were available during my care of the patient were reviewed by me and considered in my medical decision making (see chart for details).    MDM Rules/Calculators/A&P                          39 year old male with a history of schizoaffective disorder, polysubstance use disorder presenting with worsening auditory visual hallucinations over the last few weeks.  No SI or HI.  He is having some pain in his bilateral feet from walking long distance yesterday, but no other associated symptoms.  He has not been taking his home medications for  the last 3 months.  Vital signs are reassuring.  UDS is positive for amphetamines. Pt medically cleared at this time.   I spoke with [facility] provider, Gillermo Murdoch, who accepts patient to [facility].  EMTALA completed by Dr. Eudelia Bunch.   Please see psych team notes for further documentation of care/dispo. Pt stable at time of med clearance and at time of transfer to [facility].   Final Clinical Impression(s) / ED Diagnoses Final diagnoses:  Hallucinations    Rx / DC Orders ED Discharge Orders    None       Barkley Boards, PA-C 11/26/19 0350    Nira Conn, MD 11/26/19 [id number]

## 2019-11-26 NOTE — ED Notes (Signed)
Pt A&O x 4, presents with

## 2019-11-26 NOTE — ED Provider Notes (Signed)
Behavioral Health Progress Note  Date and Time: 11/26/2019 1:39 PM Name: Joshua Davila MRN:  [id number]  Subjective:  Patient presented to the [facility] with visual hallucinations and paranoia secondary to substance abuse. He reports using methamphetamine. The patient denies visual hallucinations at the time of this assessment. He reports some compliance with his medication since, this morning. He denies any current or recent suicidal ideations. His only physical complaint is his stomach hurting and he feels as though he was poisoned at his house or drugs. UDS positive for amphetamines. Will continue to monitor.   Diagnosis:  Final diagnoses:  Brief psychotic disorder (HCC)    Total Time spent with patient: 15 minutes  Past Psychiatric History: Substance use, gender identity disorder, PTSD, Schizoaffective, and MDDD Past Medical History:  Past Medical History:  Diagnosis Date  . Depression   . Gender identity disorder   . Liver disorder   . PTSD (post-traumatic stress disorder)   . Schizoaffective disorder (HCC)    History reviewed. No pertinent surgical history. Family History:  Family History  Problem Relation Age of Onset  . Multiple sclerosis Mother   . Mental illness Neg Hx    Family Psychiatric  History: Family history of mental illness.  Social History:  Social History   Substance and Sexual Activity  Alcohol Use Yes   Comment: occasional     Social History   Substance and Sexual Activity  Drug Use Yes  . Types: Cocaine, Methamphetamines, Marijuana   Comment: History of methamphetamines and marijuana    Social History   Socioeconomic History  . Marital status: Significant Other    Spouse name: [name]  . Number of children: Not on file  . Years of education: Not on file  . Highest education level: Not on file  Occupational History  . Not on file  Tobacco Use  . Smoking status: Current Every Day Smoker    Packs/day: 1.00    Types: Cigarettes  . Smokeless  tobacco: Never Used  Substance and Sexual Activity  . Alcohol use: Yes    Comment: occasional  . Drug use: Yes    Types: Cocaine, Methamphetamines, Marijuana    Comment: History of methamphetamines and marijuana  . Sexual activity: Not Currently  Other Topics Concern  . Not on file  Social History Narrative   Pt lives in [place].  Per history, Pt lives with significant other [facility] [phone]).  [name].   Social Determinants of Health   Financial Resource Strain:   . Difficulty of Paying Living Expenses: Not on file  Food Insecurity:   . Worried About Programme researcher, broadcasting/film/video in the Last Year: Not on file  . Ran Out of Food in the Last Year: Not on file  Transportation Needs:   . Lack of Transportation (Medical): Not on file  . Lack of Transportation (Non-Medical): Not on file  Physical Activity:   . Days of Exercise per Week: Not on file  . Minutes of Exercise per Session: Not on file  Stress:   . Feeling of Stress : Not on file  Social Connections:   . Frequency of Communication with Friends and Family: Not on file  . Frequency of Social Gatherings with Friends and Family: Not on file  . Attends Religious Services: Not on file  . Active Member of Clubs or Organizations: Not on file  . Attends Banker Meetings: Not on file  . Marital Status:  Not on file   SDOH:  SDOH Screenings   Alcohol Screen:   . Last Alcohol Screening Score (AUDIT): Not on file  Depression (PHQ2-9):   . PHQ-2 Score: Not on file  Financial Resource Strain:   . Difficulty of Paying Living Expenses: Not on file  Food Insecurity:   . Worried About Programme researcher, broadcasting/film/video in the Last Year: Not on file  . Ran Out of Food in the Last Year: Not on file  Housing:   . Last Housing Risk Score: Not on file  Physical Activity:   . Days of Exercise per Week: Not on file  . Minutes of Exercise per Session: Not on file  Social Connections:   . Frequency of  Communication with Friends and Family: Not on file  . Frequency of Social Gatherings with Friends and Family: Not on file  . Attends Religious Services: Not on file  . Active Member of Clubs or Organizations: Not on file  . Attends Banker Meetings: Not on file  . Marital Status: Not on file  Stress:   . Feeling of Stress : Not on file  Tobacco Use: High Risk  . Smoking Tobacco Use: Current Every Day Smoker  . Smokeless Tobacco Use: Never Used  Transportation Needs:   . Freight forwarder (Medical): Not on file  . Lack of Transportation (Non-Medical): Not on file   Additional Social History:    Pain Medications: Pt denies. Prescriptions: Pt denies. Over the Counter: Pt denies. History of alcohol / drug use?: Yes Longest period of sobriety (when/how long):  (UTA) Negative Consequences of Use:  (UTA) Withdrawal Symptoms:  (UTA) Name of Substance 1: Methamphetamine 1 - Age of First Use: not sure 1 - Amount (size/oz): UTA 1 - Frequency: UTA 1 - Duration: UTA 1 - Last Use / Amount: 2-3 days ago                  Sleep: Fair  Appetite:  Fair  Current Medications:  Current Facility-Administered Medications  Medication Dose Route Frequency Provider Last Rate Last Admin  . acetaminophen (TYLENOL) tablet 650 mg  650 mg Oral Q6H PRN Gillermo Murdoch, NP      . alum & mag hydroxide-simeth (MAALOX/MYLANTA) 200-200-20 MG/5ML suspension 30 mL  30 mL Oral Q4H PRN Gillermo Murdoch, NP      . benztropine (COGENTIN) tablet 0.5 mg  0.5 mg Oral BID Gillermo Murdoch, NP   0.5 mg at 11/26/19 1000  . haloperidol (HALDOL) tablet 5 mg  5 mg Oral BID Gillermo Murdoch, NP   5 mg at 11/26/19 1000  . hydrOXYzine (ATARAX/VISTARIL) tablet 25 mg  25 mg Oral Q8H PRN Gillermo Murdoch, NP      . magnesium hydroxide (MILK OF MAGNESIA) suspension 30 mL  30 mL Oral Daily PRN Gillermo Murdoch, NP       Current Outpatient Medications  Medication Sig Dispense  Refill  . hydrOXYzine (ATARAX/VISTARIL) 25 MG tablet Take 1 tablet (25 mg total) by mouth every 8 (eight) hours as needed for anxiety. 60 tablet 1    Labs  Lab Results:  Admission on 11/25/2019, Discharged on 11/26/2019  Component Date Value Ref Range Status  . Sodium 11/25/2019 136  135 - 145 mmol/L Final  . Potassium 11/25/2019 4.2  3.5 - 5.1 mmol/L Final  . Chloride 11/25/2019 101  98 - 111 mmol/L Final  . CO2 11/25/2019 23  22 - 32 mmol/L Final  . Glucose, Bld 11/25/2019 98  70 - 99 mg/dL Final   Glucose reference range applies only to samples taken after fasting for at least 8 hours.  . BUN 11/25/2019 14  6 - 20 mg/dL Final  . Creatinine, Ser 11/25/2019 0.89  0.61 - 1.24 mg/dL Final  . Calcium 27/05/5007 9.5  8.9 - 10.3 mg/dL Final  . Total Protein 11/25/2019 7.7  6.5 - 8.1 g/dL Final  . Albumin 38/18/2993 4.4  3.5 - 5.0 g/dL Final  . AST 71/69/6789 58* 15 - 41 U/L Final  . ALT 11/25/2019 53* 0 - 44 U/L Final  . Alkaline Phosphatase 11/25/2019 58  38 - 126 U/L Final  . Total Bilirubin 11/25/2019 0.8  0.3 - 1.2 mg/dL Final  . GFR calc non Af Amer 11/25/2019 >60  >60 mL/min Final  . GFR calc Af Amer 11/25/2019 >60  >60 mL/min Final  . Anion gap 11/25/2019 12  5 - 15 Final   Performed at [facility], [address], [place], [place] [address]  . Alcohol, Ethyl (B) 11/25/2019 <10  <10 mg/dL Final   Comment: (NOTE) Lowest detectable limit for serum alcohol is 10 mg/dL.  For medical purposes only. Performed at [facility], [address] [place], [place] [address]   . Salicylate Lvl 11/25/2019 <7.0* 7.0 - 30.0 mg/dL Final   Performed at [facility], [address], [place], [place] [address]  . Acetaminophen (Tylenol), Serum 11/25/2019 <10* 10 - 30 ug/mL Final   Comment: (NOTE) Therapeutic concentrations vary significantly. A range of 10-30 ug/mL  may be an effective concentration for many patients. However, some  are best treated at concentrations  outside of this range. Acetaminophen concentrations >150 ug/mL at 4 hours after ingestion  and >50 ug/mL at 12 hours after ingestion are often associated with  toxic reactions.  Performed at [facility], [address] [place], [place] [address]   . WBC 11/25/2019 7.5  4.0 - 10.5 K/uL Final  . RBC 11/25/2019 4.91  4.22 - 5.81 MIL/uL Final  . Hemoglobin 11/25/2019 14.0  13.0 - 17.0 g/dL Final  . HCT 35/36/1443 41.9  39 - 52 % Final  . MCV 11/25/2019 85.3  80.0 - 100.0 fL Final  . MCH 11/25/2019 28.5  26.0 - 34.0 pg Final  . MCHC 11/25/2019 33.4  30.0 - 36.0 g/dL Final  . RDW 15/40/0867 13.8  11.5 - 15.5 % Final  . Platelets 11/25/2019 274  150 - 400 K/uL Final  . nRBC 11/25/2019 0.0  0.0 - 0.2 % Final   Performed at [facility], [address] [place], [place] [address]  . Opiates 11/25/2019 NONE DETECTED  NONE DETECTED Final  . Cocaine 11/25/2019 NONE DETECTED  NONE DETECTED Final  . Benzodiazepines 11/25/2019 NONE DETECTED  NONE DETECTED Final  . Amphetamines 11/25/2019 POSITIVE* NONE DETECTED Final  . Tetrahydrocannabinol 11/25/2019 NONE DETECTED  NONE DETECTED Final  . Barbiturates 11/25/2019 NONE DETECTED  NONE DETECTED Final   Comment: (NOTE) DRUG SCREEN FOR MEDICAL PURPOSES ONLY.  IF CONFIRMATION IS NEEDED FOR ANY PURPOSE, NOTIFY LAB WITHIN 5 DAYS.  LOWEST DETECTABLE LIMITS FOR URINE DRUG SCREEN Drug Class                     Cutoff (ng/mL) Amphetamine and metabolites    1000 Barbiturate and metabolites    200 Benzodiazepine                 200  Tricyclics and metabolites     300 Opiates and metabolites        300 Cocaine and metabolites        300 THC                            50 Performed at [facility], [address] [place], [place] [address]   . SARS Coronavirus 2 11/26/2019 NEGATIVE  NEGATIVE Final   Comment: (NOTE) SARS-CoV-2 target nucleic acids are NOT DETECTED.  The SARS-CoV-2 RNA is generally detectable in upper and  lower respiratory specimens during the acute phase of infection. The lowest concentration of SARS-CoV-2 viral copies this assay can detect is 250 copies / mL. A negative result does not preclude SARS-CoV-2 infection and should not be used as the sole basis for treatment or other patient management decisions.  A negative result may occur with improper specimen collection / handling, submission of specimen other than nasopharyngeal swab, presence of viral mutation(s) within the areas targeted by this assay, and inadequate number of viral copies (<250 copies / mL). A negative result must be combined with clinical observations, patient history, and epidemiological information.  Fact Sheet for Patients:   BoilerBrush.com.cy  Fact Sheet for Healthcare Providers: https://pope.com/  This test is not yet approved or                           cleared by the [place] FDA and has been authorized for detection and/or diagnosis of SARS-CoV-2 by FDA under an Emergency Use Authorization (EUA).  This EUA will remain in effect (meaning this test can be used) for the duration of the COVID-19 declaration under Section 564(b)(1) of the Act, 21 U.S.C. section 360bbb-3(b)(1), unless the authorization is terminated or revoked sooner.  Performed at [facility], [address], [place], [place] [address]     Blood Alcohol level:  Lab Results  Component Value Date   ETH <10 11/25/2019   ETH <10 03/23/2019    Metabolic Disorder Labs: Lab Results  Component Value Date   HGBA1C 5.1 09/17/2015   MPG 100 09/17/2015   No results found for: PROLACTIN Lab Results  Component Value Date   CHOL 191 09/17/2015   TRIG 196 (H) 09/17/2015   HDL 27 (L) 09/17/2015   CHOLHDL 7.1 09/17/2015   VLDL 39 09/17/2015   LDLCALC 125 (H) 09/17/2015    Therapeutic Lab Levels: No results found for: LITHIUM Lab Results  Component Value Date   VALPROATE 49  (L) 11/11/2015   VALPROATE 24 (L) 11/07/2015   No components found for:  CBMZ  Physical Findings   AIMS     Admission (Discharged) from OP Visit from 11/03/2018 [facility] Admission (Discharged) from OP Visit from 08/22/2018 in [facility] Admission (Discharged) [facility] Admission (Discharged) from [facility] Admission (Discharged) from [facility]  AIMS Total Score 0 0 0 0 0    AUDIT     Admission (Discharged) from OP Visit from 11/03/2018 in [facility] Admission (Discharged) from [facility] Admission (Discharged) from 09[facility]15[facility]2015 [facility] Admission (Discharged) from 10[facility]02[facility]2013 [facility]  Alcohol Use Disorder Identification Test Final Score (  AUDIT) 15 0 0 2       Musculoskeletal  Strength & Muscle Tone: within normal limits Gait & Station: normal Patient leans: N/A  Psychiatric Specialty Exam  Presentation  General Appearance: Disheveled  Eye Contact:Fair  Speech:Clear and Coherent  Speech Volume:Normal  Handedness:Right   Mood and Affect  Mood:Anxious  Affect:Appropriate;Congruent   Thought Process  Thought Processes:Goal Directed  Descriptions of Associations:Intact  Orientation:Full (Time, Place and Person)  Thought Content:Logical  Hallucinations:Hallucinations: None Description of Visual Hallucinations: Denies  Ideas of Reference:None  Suicidal Thoughts:Suicidal Thoughts: No  Homicidal Thoughts:Homicidal Thoughts: No   Sensorium  Memory:Immediate Good;Recent Good;Remote Good  Judgment:Fair  Insight:Fair   Executive Functions  Concentration:Fair  Attention Span:Fair  Recall:Fair  Fund of  Knowledge:Fair  Language:Fair   Psychomotor Activity  Psychomotor Activity:Psychomotor Activity: Normal   Assets  Assets:Communication Skills;Desire for Improvement;Housing;Physical Health;Social Support   Sleep  Sleep:Number of Hours of Sleep: 6   Physical Exam  Physical Exam ROS Blood pressure 116/79, pulse 66, temperature 97.9 F (36.6 C), temperature source Tympanic, resp. rate 18, height 6' (1.829 m), weight 81.2 kg, SpO2 100 %. Body mass index is 24.28 kg/m.  Treatment Plan Summary: Daily contact with patient to assess and evaluate symptoms and progress in treatment, Medication management and Plan Continue with observation status, and reassess patient in the morning. Will resume home medications at this time.   Maryagnes Amosakia S Starkes-Perry, FNP 11/26/2019 1:39 PM

## 2019-11-27 DIAGNOSIS — F250 Schizoaffective disorder, bipolar type: Secondary | ICD-10-CM | POA: Diagnosis not present

## 2019-11-27 MED ORDER — HALOPERIDOL 5 MG PO TABS: 5.0000 mg | ORAL_TABLET | Freq: Two times a day (BID) | ORAL | 0 refills | Status: DC

## 2019-11-27 MED ORDER — HYDROXYZINE HCL 25 MG PO TABS: 25.0000 mg | ORAL_TABLET | Freq: Three times a day (TID) | ORAL | 0 refills | Status: DC | PRN

## 2019-11-27 MED ORDER — BENZTROPINE MESYLATE 0.5 MG PO TABS: 0.5000 mg | ORAL_TABLET | Freq: Two times a day (BID) | ORAL | 0 refills | Status: DC

## 2019-11-27 NOTE — ED Notes (Signed)
Patient was outside with MHT.  °

## 2019-11-27 NOTE — ED Notes (Signed)
Nurse Discharge Note:  D:Patient denies SI/HI/AVH at this time. Pt appears calm and cooperative, and no distress noted.  A: All Personal items in locker returned to pt. Pt given AVS/ Copy prescriptions. Pt escorted to the lobby to meet guardian who picked him up.  R:  Pt States he will comply with taking his medications as prescribed.

## 2019-11-27 NOTE — ED Notes (Addendum)
Pt sleeping at present, no distress noted, monitoring for safety. 

## 2019-11-27 NOTE — ED Provider Notes (Signed)
FBC/OBS ASAP Discharge Summary  Date and Time: 11/27/2019 10:28 AM  Name: Joshua Davila  MRN:  [id number]   Discharge Diagnoses:  Final diagnoses:  Substance induced mood disorder (HCC)  Methamphetamine use disorder, severe (HCC)  Schizoaffective disorder, bipolar type (HCC)    Subjective: Patient reports that he is doing okay today but does not feel good.  When asked to elaborate on what does not feel good states he just does not feel good.  Patient denies any suicidal or homicidal ideations and denies any hallucinations.  Patient reports that he was going to be restarted on his Haldol and is feeling much better today but wants to be sent to the [facility].  When asked why, the patient reports that he wants to go to the [facility] to assist him with housing.  Patient is informed that we cannot send him directly to the hospital for housing assistance and their other [facility] facilities closer that can assist with housing.  He states that he is not interested in going to any other [facility] except for [facility] and states that he will have his husband take him. Patient's husband, [name], was contacted for collateral information.  Mellody Dance is also patient's legal guardian.  He was notified that the patient was being discharged and has been doing denying any suicidal or homicidal ideations and has denied any hallucinations.  He stated that he did agree to take him to the [place] to give him assistance but stated he was unsure exactly what he was looking for.  He states he had no safety concerns with the patient being discharged this time as he was being prescribed his medications.  Patient's legal guardian was informed that the patient was being prescribed 30-day supply of his medications upon discharge which included Haldol 5 mg p.o. twice daily and Cogentin 0.5 mg p.o. twice daily.  Stay Summary: Patient is a 39 year old male that presented to the [facility] reporting visual hallucinations and paranoia secondary to  methamphetamine use.  Patient had denied any recent suicidal or homicidal ideations.  Patient was admitted to the [facility] for continuous observation and today patient has shown stabilization and improvement with his mood.  Patient was requesting transfer to [facility] for housing assistance and was informed that we cannot transport for that reason.  Patient agreed to have his husband pick him up.  Patient's husband, legal guardian, was contacted and collateral information was gained.  Safety plan was established legal guardian is picking patient up from the facility.  Patient was provided with prescriptions for his medications.  At this time the patient does not meet inpatient psychiatric treatment and is psychiatrically cleared.  Total Time spent with patient: 30 minutes  Past Psychiatric History: depression, PTSD, Schizoaffective Past Medical History:  Past Medical History:  Diagnosis Date  . Depression   . Gender identity disorder   . Liver disorder   . PTSD (post-traumatic stress disorder)   . Schizoaffective disorder (HCC)    History reviewed. No pertinent surgical history. Family History:  Family History  Problem Relation Age of Onset  . Multiple sclerosis Mother   . Mental illness Neg Hx    Family Psychiatric History: None reported Social History:  Social History   Substance and Sexual Activity  Alcohol Use Yes   Comment: occasional     Social History   Substance and Sexual Activity  Drug Use Yes  . Types: Cocaine, Methamphetamines, Marijuana   Comment: History of methamphetamines and marijuana  Social History   Socioeconomic History  . Marital status: Significant Other    Spouse name: [name]  . Number of children: Not on file  . Years of education: Not on file  . Highest education level: Not on file  Occupational History  . Not on file  Tobacco Use  . Smoking status: Current Every Day Smoker    Packs/day: 1.00    Types: Cigarettes  . Smokeless tobacco:  Never Used  Substance and Sexual Activity  . Alcohol use: Yes    Comment: occasional  . Drug use: Yes    Types: Cocaine, Methamphetamines, Marijuana    Comment: History of methamphetamines and marijuana  . Sexual activity: Not Currently  Other Topics Concern  . Not on file  Social History Narrative   [place]  Per history, Pt lives [facility] significant other [facility] [phone]).  Mr. Joshua ChanceHill stated that he is Pt's Medical POA.   Social Determinants of Health   Financial Resource Strain:   . Difficulty of Paying Living Expenses: Not on file  Food Insecurity:   . Worried About Programme researcher, broadcasting/film/videounning Out of Food in the Last Year: Not on file  . Ran Out of Food in the Last Year: Not on file  Transportation Needs:   . Lack of Transportation (Medical): Not on file  . Lack of Transportation (Non-Medical): Not on file  Physical Activity:   . Days of Exercise per Week: Not on file  . Minutes of Exercise per Session: Not on file  Stress:   . Feeling of Stress : Not on file  Social Connections:   . Frequency of Communication with Friends and Family: Not on file  . Frequency of Social Gatherings with Friends and Family: Not on file  . Attends Religious Services: Not on file  . Active Member of Clubs or Organizations: Not on file  . Attends BankerClub or Organization Meetings: Not on file  . Marital Status: Not on file   SDOH:  SDOH Screenings   Alcohol Screen:   . Last Alcohol Screening Score (AUDIT): Not on file  Depression (PHQ2-9):   . PHQ-2 Score: Not on file  Financial Resource Strain:   . Difficulty of Paying Living Expenses: Not on file  Food Insecurity:   . Worried About Programme researcher, broadcasting/film/videounning Out of Food in the Last Year: Not on file  . Ran Out of Food in the Last Year: Not on file  Housing:   . Last Housing Risk Score: Not on file  Physical Activity:   . Days of Exercise per Week: Not on file  . Minutes of Exercise per Session: Not on file  Social Connections:   . Frequency of  Communication with Friends and Family: Not on file  . Frequency of Social Gatherings with Friends and Family: Not on file  . Attends Religious Services: Not on file  . Active Member of Clubs or Organizations: Not on file  . Attends BankerClub or Organization Meetings: Not on file  . Marital Status: Not on file  Stress:   . Feeling of Stress : Not on file  Tobacco Use: High Risk  . Smoking Tobacco Use: Current Every Day Smoker  . Smokeless Tobacco Use: Never Used  Transportation Needs:   . Freight forwarderLack of Transportation (Medical): Not on file  . Lack of Transportation (Non-Medical): Not on file    Has this patient used any form of tobacco in the last 30 days? (Cigarettes, Smokeless Tobacco, Cigars, and/or Pipes) A prescription for an  FDA-approved tobacco cessation medication was offered at discharge and the patient refused  Current Medications:  Current Facility-Administered Medications  Medication Dose Route Frequency Provider Last Rate Last Admin  . acetaminophen (TYLENOL) tablet 650 mg  650 mg Oral Q6H PRN Gillermo Murdoch, NP      . alum & mag hydroxide-simeth (MAALOX/MYLANTA) 200-200-20 MG/5ML suspension 30 mL  30 mL Oral Q4H PRN Gillermo Murdoch, NP      . benztropine (COGENTIN) tablet 0.5 mg  0.5 mg Oral BID Gillermo Murdoch, NP   0.5 mg at 11/27/19 0859  . haloperidol (HALDOL) tablet 5 mg  5 mg Oral BID Gillermo Murdoch, NP   5 mg at 11/27/19 4854  . hydrOXYzine (ATARAX/VISTARIL) tablet 25 mg  25 mg Oral Q8H PRN Gillermo Murdoch, NP      . magnesium hydroxide (MILK OF MAGNESIA) suspension 30 mL  30 mL Oral Daily PRN Gillermo Murdoch, NP       Current Outpatient Medications  Medication Sig Dispense Refill  . benztropine (COGENTIN) 0.5 MG tablet Take 1 tablet (0.5 mg total) by mouth 2 (two) times daily. 60 tablet 0  . haloperidol (HALDOL) 5 MG tablet Take 1 tablet (5 mg total) by mouth 2 (two) times daily. 60 tablet 0  . hydrOXYzine (ATARAX/VISTARIL) 25 MG tablet Take 1  tablet (25 mg total) by mouth every 8 (eight) hours as needed for anxiety. 60 tablet 0    PTA Medications: (Not in a hospital admission)   Musculoskeletal  Strength & Muscle Tone: within normal limits Gait & Station: normal Patient leans: N/A  Psychiatric Specialty Exam  Presentation  General Appearance: Appropriate for Environment;Disheveled  Eye Contact:Good  Speech:Clear and Coherent;Normal Rate  Speech Volume:Normal  Handedness:Right   Mood and Affect  Mood:Euthymic  Affect:Congruent;Appropriate   Thought Process  Thought Processes:Coherent  Descriptions of Associations:Intact  Orientation:Full (Time, Place and Person)  Thought Content:WDL  Hallucinations:Hallucinations: None Description of Visual Hallucinations: Denies  Ideas of Reference:None  Suicidal Thoughts:Suicidal Thoughts: No  Homicidal Thoughts:Homicidal Thoughts: No   Sensorium  Memory:Immediate Good;Recent Good;Remote Good  Judgment:Fair  Insight:Fair   Executive Functions  Concentration:Good  Attention Span:Good  Recall:Good  Fund of Knowledge:Fair  Language:Good   Psychomotor Activity  Psychomotor Activity:Psychomotor Activity: Normal   Assets  Assets:Communication Skills;Desire for Improvement;Financial Resources/Insurance;Housing;Social Support;Transportation   Sleep  Sleep:Sleep: Good Number of Hours of Sleep: 6   Physical Exam  Physical Exam Vitals and nursing note reviewed.  Constitutional:      Appearance: He is well-developed.  Cardiovascular:     Rate and Rhythm: Normal rate.  Pulmonary:     Effort: Pulmonary effort is normal.  Musculoskeletal:        General: Normal range of motion.  Skin:    General: Skin is warm.  Neurological:     Mental Status: He is alert and oriented to person, place, and time.    Review of Systems  Constitutional: Negative.   HENT: Negative.   Eyes: Negative.   Respiratory: Negative.   Cardiovascular: Negative.    Gastrointestinal: Negative.   Genitourinary: Negative.   Musculoskeletal: Negative.   Skin: Negative.   Neurological: Negative.   Endo/Heme/Allergies: Negative.   Psychiatric/Behavioral: Negative.    Blood pressure 92/78, pulse 99, temperature (!) 97.5 F (36.4 C), temperature source Tympanic, resp. rate 18, height 6' (1.829 m), weight 81.2 kg, SpO2 100 %. Body mass index is 24.28 kg/m.  Demographic Factors:  Male, Caucasian and Gay, lesbian, or bisexual orientation  Loss Factors: NA  Historical  Factors: NA  Risk Reduction Factors:   Sense of responsibility to family, Living with another person, especially a relative, Positive social support and Positive therapeutic relationship  Continued Clinical Symptoms:  Alcohol/Substance Abuse/Dependencies Previous Psychiatric Diagnoses and Treatments  Cognitive Features That Contribute To Risk:  None    Suicide Risk:  Mild:  Suicidal ideation of limited frequency, intensity, duration, and specificity.  There are no identifiable plans, no associated intent, mild dysphoria and related symptoms, good self-control (both objective and subjective assessment), few other risk factors, and identifiable protective factors, including available and accessible social support.  Plan Of Care/Follow-up recommendations:  Continue activity as tolerated. Continue diet as recommended by your PCP. Ensure to keep all appointments with outpatient providers.  Disposition: Discharge home to legal guardian  Maryfrances Bunnell, FNP 11/27/2019, 10:28 AM

## 2019-11-27 NOTE — ED Notes (Signed)
Patient resting on bed with eyes closed. Respirations even and non labored no distress noted.

## 2019-11-27 NOTE — ED Notes (Signed)
Patient denies SI/HI. Patient states he is not having auditory hallucinations like before they have decreased. Patient voices no concerns. Monitoring continues and patient remains safe on unit.

## 2019-11-27 NOTE — Discharge Instructions (Addendum)
Take medications as prescribed [facility]

## 2020-01-26 ENCOUNTER — Encounter (HOSPITAL_COMMUNITY): Payer: Self-pay | Admitting: Emergency Medicine

## 2020-01-26 ENCOUNTER — Ambulatory Visit (HOSPITAL_COMMUNITY)
Admission: EM | Admit: 2020-01-26 | Discharge: 2020-01-26 | Disposition: A | Discharge status: Home / Self Care | Payer: Medicare Other | Attending: Urgent Care | Admitting: Urgent Care

## 2020-01-26 ENCOUNTER — Other Ambulatory Visit: Payer: Self-pay

## 2020-01-26 DIAGNOSIS — Z760 Encounter for issue of repeat prescription: Secondary | ICD-10-CM

## 2020-01-26 DIAGNOSIS — F419 Anxiety disorder, unspecified: Secondary | ICD-10-CM

## 2020-01-26 DIAGNOSIS — F259 Schizoaffective disorder, unspecified: Secondary | ICD-10-CM

## 2020-01-26 MED ORDER — HYDROXYZINE HCL 25 MG PO TABS: 25.0000 mg | ORAL_TABLET | Freq: Three times a day (TID) | ORAL | 0 refills | Status: DC | PRN

## 2020-01-26 MED ORDER — BENZTROPINE MESYLATE 0.5 MG PO TABS: 0.5000 mg | ORAL_TABLET | Freq: Two times a day (BID) | ORAL | 0 refills | Status: DC

## 2020-01-26 MED ORDER — HALOPERIDOL 5 MG PO TABS: 5.0000 mg | ORAL_TABLET | Freq: Two times a day (BID) | ORAL | 0 refills | Status: DC

## 2020-01-26 NOTE — ED Provider Notes (Signed)
[facility]   MRN: [id number] DOB: Dec 18, 1980  Subjective:   Joshua Davila is a 39 y.o. male presenting for medication refill of his haldol, Cogentin, hydroxyzine.  Patient is not currently established with any mental health provider.  Has a previous history of schizoaffective disorder.  He reports with his caretaker today, states that he is still having episodes of significant anxiety and difficulty managing his mood.  Would like something stronger than hydroxyzine, currently takes 25 mg 3 times daily as needed.  Patient reports that he feels a lot better when he is on his medication but unfortunately has been out for the past month.  No current facility-administered medications for this encounter.  Current Outpatient Medications:    benztropine (COGENTIN) 0.5 MG tablet, Take 1 tablet (0.5 mg total) by mouth 2 (two) times daily., Disp: 60 tablet, Rfl: 0   haloperidol (HALDOL) 5 MG tablet, Take 1 tablet (5 mg total) by mouth 2 (two) times daily., Disp: 60 tablet, Rfl: 0   hydrOXYzine (ATARAX/VISTARIL) 25 MG tablet, Take 1 tablet (25 mg total) by mouth every 8 (eight) hours as needed for anxiety., Disp: 60 tablet, Rfl: 0   Allergies  Allergen Reactions   Penicillins Swelling, Rash and Other (See Comments)    Reaction:  Facial swelling Has patient had a PCN reaction causing immediate rash, facial/tongue/throat swelling, SOB or lightheadedness with hypotension: Yes Has patient had a PCN reaction causing severe rash involving mucus membranes or skin necrosis: No Has patient had a PCN reaction that required hospitalization No Has patient had a PCN reaction occurring within the last 10 years: No If all of the above answers are "NO", then may proceed with Cephalosporin use.   Sulfa Antibiotics Rash    Past Medical History:  Diagnosis Date   Depression    Gender identity disorder    Liver disorder    PTSD (post-traumatic stress disorder)    Schizoaffective  disorder (HCC)      History reviewed. No pertinent surgical history.  Family History  Problem Relation Age of Onset   Multiple sclerosis Mother    Mental illness Neg Hx     Social History   Tobacco Use   Smoking status: Current Every Day Smoker    Packs/day: 1.00    Types: Cigarettes   Smokeless tobacco: Never Used  Substance Use Topics   Alcohol use: Yes    Comment: occasional   Drug use: Yes    Types: Cocaine, Methamphetamines, Marijuana    Comment: History of methamphetamines and marijuana    ROS   Objective:   Vitals: BP 130/88 (BP Location: Left Arm)    Pulse 97    Temp 97.9 F (36.6 C) (Oral)    Resp 16    SpO2 100%   Physical Exam Constitutional:      General: He is not in acute distress.    Appearance: Normal appearance. He is well-developed and normal weight. He is not ill-appearing, toxic-appearing or diaphoretic.  HENT:     Head: Normocephalic and atraumatic.     Right Ear: External ear normal.     Left Ear: External ear normal.     Nose: Nose normal.     Mouth/Throat:     Pharynx: Oropharynx is clear.  Eyes:     General: No scleral icterus.       Right eye: No discharge.        Left eye: No discharge.     Extraocular Movements: Extraocular  movements intact.     Pupils: Pupils are equal, round, and reactive to light.  Cardiovascular:     Rate and Rhythm: Normal rate.  Pulmonary:     Effort: Pulmonary effort is normal.  Musculoskeletal:     Cervical back: Normal range of motion.  Skin:    General: Skin is warm and dry.  Neurological:     Mental Status: He is alert and oriented to person, place, and time.  Psychiatric:     Comments: Flat affect, disheveled.  Monotone.  Patient is not aggressive in clinic.  Was cooperative throughout.       Assessment and Plan :   PDMP not reviewed this encounter.  1. Anxiety   2. Medication refill   3. Schizoaffective disorder, unspecified type (HCC)     I refilled patient's medications for  Haldol, Cogentin.  Recommended trying to increase the hydroxyzine to 25 to 50 mg 3 times daily as needed.  Emphasized need for follow-up and establishing care with a new mental health provider, information provided to the patient and his caregiver for this. Counseled patient on potential for adverse effects with medications prescribed/recommended today, ER and return-to-clinic precautions discussed, patient verbalized understanding.    Wallis Bamberg, PA-C 01/26/20 1440

## 2020-01-26 NOTE — ED Triage Notes (Signed)
Pt presents for refill on his medication. Caregiver is here with him and states he does not have a PCP and does not want to get one. Caregiver is requesting multiple refills. I informed him that we do not fill long term prescriptions here. He has been out of his medication x 1 month.

## 2020-05-05 ENCOUNTER — Emergency Department (HOSPITAL_COMMUNITY): Payer: Medicare Other

## 2020-05-05 ENCOUNTER — Encounter (HOSPITAL_COMMUNITY): Payer: Self-pay | Admitting: Emergency Medicine

## 2020-05-05 ENCOUNTER — Other Ambulatory Visit: Payer: Self-pay

## 2020-05-05 ENCOUNTER — Emergency Department (HOSPITAL_BASED_OUTPATIENT_CLINIC_OR_DEPARTMENT_OTHER): Payer: Medicare Other

## 2020-05-05 ENCOUNTER — Emergency Department (HOSPITAL_COMMUNITY)
Admission: EM | Admit: 2020-05-05 | Discharge: 2020-05-05 | Disposition: A | Discharge status: Home / Self Care | Payer: Medicare Other | Attending: Emergency Medicine | Admitting: Emergency Medicine

## 2020-05-05 DIAGNOSIS — M79641 Pain in right hand: Secondary | ICD-10-CM | POA: Diagnosis not present

## 2020-05-05 DIAGNOSIS — Z79899 Other long term (current) drug therapy: Secondary | ICD-10-CM | POA: Insufficient documentation

## 2020-05-05 DIAGNOSIS — F17210 Nicotine dependence, cigarettes, uncomplicated: Secondary | ICD-10-CM | POA: Insufficient documentation

## 2020-05-05 DIAGNOSIS — Z7901 Long term (current) use of anticoagulants: Secondary | ICD-10-CM | POA: Diagnosis not present

## 2020-05-05 DIAGNOSIS — I82621 Acute embolism and thrombosis of deep veins of right upper extremity: Secondary | ICD-10-CM | POA: Insufficient documentation

## 2020-05-05 DIAGNOSIS — R609 Edema, unspecified: Secondary | ICD-10-CM | POA: Diagnosis not present

## 2020-05-05 DIAGNOSIS — M7989 Other specified soft tissue disorders: Secondary | ICD-10-CM

## 2020-05-05 DIAGNOSIS — W19XXXA Unspecified fall, initial encounter: Secondary | ICD-10-CM | POA: Diagnosis not present

## 2020-05-05 DIAGNOSIS — R42 Dizziness and giddiness: Secondary | ICD-10-CM | POA: Diagnosis not present

## 2020-05-05 LAB — COMPREHENSIVE METABOLIC PANEL
ALT: 33 U/L (ref 0–44)
AST: 30 U/L (ref 15–41)
Albumin: 3.8 g/dL (ref 3.5–5.0)
Alkaline Phosphatase: 54 U/L (ref 38–126)
Anion gap: 10 (ref 5–15)
BUN: 8 mg/dL (ref 6–20)
CO2: 26 mmol/L (ref 22–32)
Calcium: 9.2 mg/dL (ref 8.9–10.3)
Chloride: 101 mmol/L (ref 98–111)
Creatinine, Ser: 0.82 mg/dL (ref 0.61–1.24)
GFR, Estimated: >60 mL/min (ref >60)
Glucose, Bld: 160 mg/dL — ABNORMAL HIGH (ref 70–99)
Potassium: 4 mmol/L (ref 3.5–5.1)
Sodium: 137 mmol/L (ref 135–145)
Total Bilirubin: 0.7 mg/dL (ref 0.3–1.2)
Total Protein: 6.9 g/dL (ref 6.5–8.1)

## 2020-05-05 LAB — CBC WITH DIFFERENTIAL/PLATELET
Abs Immature Granulocytes: 0.05 10*3/uL (ref 0.00–0.07)
Basophils Absolute: 0 10*3/uL (ref 0.0–0.1)
Basophils Relative: 0 %
Eosinophils Absolute: 0 10*3/uL (ref 0.0–0.5)
Eosinophils Relative: 0 %
HCT: 41.6 % (ref 39.0–52.0)
Hemoglobin: 13.8 g/dL (ref 13.0–17.0)
Immature Granulocytes: 1 %
Lymphocytes Relative: 29 %
Lymphs Abs: 2 10*3/uL (ref 0.7–4.0)
MCH: 29.4 pg (ref 26.0–34.0)
MCHC: 33.2 g/dL (ref 30.0–36.0)
MCV: 88.5 fL (ref 80.0–100.0)
Monocytes Absolute: 0.3 10*3/uL (ref 0.1–1.0)
Monocytes Relative: 5 %
Neutro Abs: 4.4 10*3/uL (ref 1.7–7.7)
Neutrophils Relative %: 65 %
Platelets: 212 10*3/uL (ref 150–400)
RBC: 4.7 MIL/uL (ref 4.22–5.81)
RDW: 13.6 % (ref 11.5–15.5)
WBC: 6.7 10*3/uL (ref 4.0–10.5)
nRBC: 0 % (ref 0.0–0.2)

## 2020-05-05 MED ORDER — ACETAMINOPHEN 500 MG PO TABS
1000.0000 mg | ORAL_TABLET | Freq: Once | ORAL | Status: AC
  Administered 2020-05-05: 1000 mg via ORAL
  Filled 2020-05-05: qty 2

## 2020-05-05 MED ORDER — APIXABAN 5 MG PO TABS: ORAL_TABLET | ORAL | 0 refills | Status: DC

## 2020-05-05 NOTE — ED Provider Notes (Signed)
[facility] EMERGENCY DEPARTMENT Provider Note   CSN: [id number] Arrival date & time: 05/05/20  1408     History Chief Complaint  Patient presents with  . hand swelling    BURCH MARCHUK is a 40 y.o. male.  HPI Patient is a 40 year old male with a medical history as noted below.  Presents the emergency department with hand swelling and pain for 3 days.  States it is in his right hand.  Unsure of the cause.  Pain worsens with palpation and movement of the hand.  No fevers, chills, chest pain, shortness of breath, vomiting.  Does report some mild nausea.  When patient initially arrived to the ED he was yelling at staff.  Has been answering questions for me but continually requests food and more blankets.  Patient states he smokes crystal meth.  Has not done so for about 1 week.  Denies any history of IVDA.    Past Medical History:  Diagnosis Date  . Depression   . Gender identity disorder   . Liver disorder   . PTSD (post-traumatic stress disorder)   . Schizoaffective disorder [facility])     Patient Active Problem List   Diagnosis Date Noted  . 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 Schizoaffective disorder (HCC) 11/03/2018  . Aggressive behavior   . Alcohol use disorder, severe, in early remission, in controlled environment (HCC) 04/22/2017  . Polysubstance abuse (HCC)   . Schizoaffective disorder, bipolar type (HCC) 10/02/2015  . Involuntary commitment 10/02/2015  . Cannabis use disorder, moderate, dependence (HCC) 09/16/2015  . Opioid use disorder, moderate, dependence (HCC) 09/16/2015  . Substance or medication-induced bipolar and related disorder with onset during intoxication (HCC) 09/16/2015  . 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 Benzodiazepine dependence (HCC) 10/04/2013  . PTSD (post-traumatic stress disorder) 10/03/2013  .  Substance induced mood disorder (HCC) 10/03/2013  . 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 No pertinent surgical history.     Family History  Problem Relation Age of Onset  . Multiple sclerosis Mother   . Mental illness Neg Hx     Social History   Tobacco Use  . Smoking status: Current Every Day Smoker    Packs/day: 1.00    Types: Cigarettes  . Smokeless tobacco: Never Used  Substance Use Topics  . Alcohol use: Yes    Comment: occasional  . Drug use: Yes    Types: Cocaine, Methamphetamines, Marijuana    Comment: History of methamphetamines and marijuana    Home Medications Prior to Admission medications   Medication Sig Start Date End Date Taking? Authorizing Provider  apixaban (ELIQUIS) 5 MG TABS tablet Take 2 tablets (10mg ) twice daily for 7 days, then 1 tablet (5mg ) twice daily 05/05/20  Yes , PA-C  benztropine (COGENTIN) 0.5 MG tablet Take 1 tablet (0.5 mg total) by mouth 2 (two) times daily. 01/26/20   Placido Sou, PA-C  haloperidol (HALDOL) 5 MG tablet Take 1 tablet (5 mg total) by mouth 2 (two) times daily. 01/26/20   Wallis Bamberg, PA-C  hydrOXYzine (ATARAX/VISTARIL) 25 MG tablet Take 1 tablet (25 mg total) by mouth every 8 (eight) hours as needed for anxiety. 01/26/20   Wallis Bamberg, PA-C    Allergies    Penicillins and Sulfa antibiotics  Review of Systems   Review of Systems  All other systems reviewed and are negative. Ten  systems reviewed and are negative for acute change, except as noted in the HPI.   Physical Exam Updated Vital Signs BP 135/81 (BP Location: Right Arm)   Pulse (!) 105   Temp 98.5 F (36.9 C) (Oral)   Resp 18   SpO2 100%   Physical Exam Vitals and nursing note reviewed.  Constitutional:      General: He is not in acute distress.    Appearance: Normal appearance. He is normal weight. He is not ill-appearing, toxic-appearing or diaphoretic.     Comments: Well-developed disheveled  adult male.  Sitting upright.  Answers questions when asked.  HENT:     Head: Normocephalic and atraumatic.     Right Ear: External ear normal.     Left Ear: External ear normal.     Nose: Nose normal.     Mouth/Throat:     Mouth: Mucous membranes are moist.     Pharynx: Oropharynx is clear. No oropharyngeal exudate or posterior oropharyngeal erythema.  Eyes:     Extraocular Movements: Extraocular movements intact.  Cardiovascular:     Rate and Rhythm: Normal rate and regular rhythm.     Pulses: Normal pulses.     Heart sounds: Normal heart sounds. No murmur heard. No friction rub. No gallop.      Comments: Regular rate and rhythm without murmurs, rubs, or gallops. Pulmonary:     Effort: Pulmonary effort is normal. No respiratory distress.     Breath sounds: Normal breath sounds. No stridor. No wheezing, rhonchi or rales.  Abdominal:     General: Abdomen is flat.     Palpations: Abdomen is soft.     Tenderness: There is no abdominal tenderness.  Musculoskeletal:        General: Swelling and tenderness present. Normal range of motion.     Cervical back: Normal range of motion and neck supple. No tenderness.     Comments: Trace edema noted diffusely throughout right hand.  No overlying erythema.  Mild TTP noted diffusely across the right hand.  Patient able to fully flex and extend all the fingers of the right hand.  2+ radial pulses.  Distal sensation intact.  No wrist or elbow pain.  No track marks noted on the upper extremities.  Skin:    General: Skin is warm and dry.  Neurological:     General: No focal deficit present.     Mental Status: He is alert and oriented to person, place, and time.  Psychiatric:        Mood and Affect: Mood normal.        Behavior: Behavior normal.    ED Results / Procedures / Treatments   Labs (all labs ordered are listed, but only abnormal results are displayed) Labs Reviewed  COMPREHENSIVE METABOLIC PANEL - Abnormal; Notable for the following  components:      Result Value   Glucose, Bld 160 (*)    All other components within normal limits  CBC WITH DIFFERENTIAL/PLATELET   EKG None  Radiology DG Hand Complete Right  Result Date: 05/05/2020 CLINICAL DATA:  Pain and swell EXAM: RIGHT HAND - COMPLETE 3+ VIEW COMPARISON:  December 16, 2014 FINDINGS: Frontal, oblique, and lateral views were obtained. There is evidence of an old healed fracture of the fifth metacarpal with chronic remodeling and bowing. No acute fracture or dislocation. The joint spaces appear normal. No erosive change. IMPRESSION: Evidence of prior fracture of the fifth metacarpal with bowing and remodeling. No acute fracture or dislocation.  No appreciable arthropathic change. Electronically Signed   By: Bretta Bang III M.D.   On: 05/05/2020 15:50   UE VENOUS DUPLEX ([facility] & [facility] 7 am - 7 pm)  Result Date: 05/05/2020 UPPER VENOUS STUDY  Indications: Swelling Comparison Study: no prior Performing Technologist: Blanch Media RVS  Examination Guidelines: A complete evaluation includes B-mode imaging, spectral Doppler, color Doppler, and power Doppler as needed of all accessible portions of each vessel. Bilateral testing is considered an integral part of a complete examination. Limited examinations for reoccurring indications may be performed as noted.  Right Findings: +----------+------------+---------+-----------+----------+-----------------+ RIGHT     CompressiblePhasicitySpontaneousProperties     Summary      +----------+------------+---------+-----------+----------+-----------------+ IJV           Full       Yes       Yes                                +----------+------------+---------+-----------+----------+-----------------+ Subclavian    Full                                                    +----------+------------+---------+-----------+----------+-----------------+ Axillary      Full                                                     +----------+------------+---------+-----------+----------+-----------------+ Brachial      None       No        No               Age Indeterminate +----------+------------+---------+-----------+----------+-----------------+ Radial        Full                                                    +----------+------------+---------+-----------+----------+-----------------+ Ulnar         Full                                                    +----------+------------+---------+-----------+----------+-----------------+  Summary:  Right: Findings consistent with age indeterminate deep vein thrombosis involving the right brachial veins.  *See table(s) above for measurements and observations.  Diagnosing physician: Coral Else MD Electronically signed by Coral Else MD on 05/05/2020 at 7:18:26 PM.    Final    Procedures Procedures   Medications Ordered in ED Medications  acetaminophen (TYLENOL) tablet 1,000 mg (1,000 mg Oral Given 05/05/20 1523)   ED Course  I have reviewed the triage vital signs and the nursing notes.  Pertinent labs & imaging results that were available during my care of the patient were reviewed by me and considered in my medical decision making (see chart for details).  Clinical Course as of 05/05/20 2014  Sun May 05, 2020  1556 DG Hand Complete Right IMPRESSION: Evidence of prior fracture of the fifth metacarpal with bowing  and remodeling. No acute fracture or dislocation. No appreciable arthropathic change. [LJ]  1903 DG Hand Complete Right Right: Findings consistent with age indeterminate deep vein thrombosis involving the right brachial veins. [LJ]    Clinical Course User Index [LJ] Benisha Hadaway, PA-C   MDM Rules/Calculators/A&P                          PatienPlacido Sout is a 40 year old male who presents the emergency department due to atraumatic right hand pain and swelling that started over the past few days.  No history of IVDA but patient does note  that he smokes crystal meth.  Denies he has done so for the past week.  Ultrasound of the right upper extremity is showing an age-indeterminate DVT involving the right brachial veins.  Likely the source of patient's symptoms today.  Old fracture was noted on x-ray but no acute fracture or dislocation.  No appreciable arthropathy.  Neurovascularly intact in the right hand.  Mild swelling in the hand.  No erythema in the hand, increased warmth, or signs of infection.  No leukocytosis on CBC.  No shortness of breath or chest pain.  No tachycardia.  No hypoxia.  No symptoms concerning for PE at this time.  Labs are reassuring.  Normal kidney function.  Attempted to reach out to the patient's legal guardian but could not successfully reach them.  Patient given a prescription for Eliquis.  We discussed in length the seriousness of his diagnosis and the need for anticoagulation.  He verbalized understanding.  Patient was concerned that he could not get a ride home, so he was given a cab voucher.  Discussed return precautions in length.  Also gave him a referral [facility].  I also reached out to the social work team who are not currently in the hospital.  They should be reaching out to him soon regarding PCP follow-up as well as financial assistance regarding his medications.  His questions were answered and he was amicable at the time of discharge.  Pt discussed with my attending physician Dr. Linwood DibblesJon Knapp who is in agreement with the above plan.   Final Clinical Impression(s) / ED Diagnoses Final diagnoses:  Deep vein thrombosis (DVT) of brachial vein of right upper extremity, unspecified chronicity (HCC)    Rx / DC Orders ED Discharge Orders         Ordered    apixaban (ELIQUIS) 5 MG TABS tablet        05/05/20 2003           Placido SouJoldersma, Patrcia Schnepp, PA-C 05/05/20 2014    Linwood DibblesKnapp, Jon, MD 05/06/20 [phone]

## 2020-05-05 NOTE — ED Triage Notes (Signed)
Pt to triage via [facility].  C/o R hand swelling x 3 days.  Pt began yelling on arrival to triage and will not answer any triage questions.  Wanting something to drink.  Informed him he will have to wait to see Dr.  Rock Nephew quit screaming when he was taken to waiting area.  However, he refused to answer questions.

## 2020-05-05 NOTE — ED Notes (Signed)
15:38 vitals are from another patient, error crossing over into the chart from the vital signs machine in triage.

## 2020-05-05 NOTE — Discharge Instructions (Addendum)
Like we discussed, you have a blood clot in your right arm.  It is very important that you take the prescribed blood thinner that we are giving you.  It is called Eliquis.  The dosing and frequency of this medication should be listed on the box that the medication came in.  Please make sure that you are taking this medication.  You need to also have a regular doctor to follow-up with.  It is important that you follow-up with them as soon as possible for reevaluation.  Please call [facility] to set up a follow-up appointment.  This is located right by the [facility] emergency department.  Their phone number is below.  If you develop any new or worsening symptoms including chest pain or shortness of breath, or worsening swelling in the hand, you need to return to the emergency department immediately for reevaluation.

## 2020-05-05 NOTE — ED Notes (Signed)
Patient refusing VS at this time

## 2020-05-05 NOTE — ED Notes (Signed)
Patient refusing VS for d/c

## 2020-06-18 ENCOUNTER — Ambulatory Visit (HOSPITAL_COMMUNITY)
Admission: EM | Admit: 2020-06-18 | Discharge: 2020-06-18 | Disposition: A | Discharge status: Home / Self Care | Payer: Medicare Other | Attending: Student | Admitting: Student

## 2020-06-18 DIAGNOSIS — F250 Schizoaffective disorder, bipolar type: Secondary | ICD-10-CM | POA: Diagnosis not present

## 2020-06-18 DIAGNOSIS — Z20822 Contact with and (suspected) exposure to covid-19: Secondary | ICD-10-CM | POA: Insufficient documentation

## 2020-06-18 LAB — HEMOGLOBIN A1C
Hgb A1c MFr Bld: 5.4 % (ref 4.8–5.6)
Mean Plasma Glucose: 108.28 mg/dL

## 2020-06-18 LAB — CBC WITH DIFFERENTIAL/PLATELET
Abs Immature Granulocytes: 0.03 10*3/uL (ref 0.00–0.07)
Basophils Absolute: 0 10*3/uL (ref 0.0–0.1)
Basophils Relative: 1 %
Eosinophils Absolute: 0.1 10*3/uL (ref 0.0–0.5)
Eosinophils Relative: 1 %
HCT: 37.4 % — ABNORMAL LOW (ref 39.0–52.0)
Hemoglobin: 12.7 g/dL — ABNORMAL LOW (ref 13.0–17.0)
Immature Granulocytes: 1 %
Lymphocytes Relative: 34 %
Lymphs Abs: 2 10*3/uL (ref 0.7–4.0)
MCH: 29.3 pg (ref 26.0–34.0)
MCHC: 34 g/dL (ref 30.0–36.0)
MCV: 86.4 fL (ref 80.0–100.0)
Monocytes Absolute: 0.5 10*3/uL (ref 0.1–1.0)
Monocytes Relative: 8 %
Neutro Abs: 3.2 10*3/uL (ref 1.7–7.7)
Neutrophils Relative %: 55 %
Platelets: 184 10*3/uL (ref 150–400)
RBC: 4.33 MIL/uL (ref 4.22–5.81)
RDW: 13.7 % (ref 11.5–15.5)
WBC: 5.8 10*3/uL (ref 4.0–10.5)
nRBC: 0 % (ref 0.0–0.2)

## 2020-06-18 LAB — RESP PANEL BY RT-PCR (FLU A&B, COVID) ARPGX2
Influenza A by PCR: NEGATIVE
Influenza B by PCR: NEGATIVE
SARS Coronavirus 2 by RT PCR: NEGATIVE

## 2020-06-18 LAB — COMPREHENSIVE METABOLIC PANEL
ALT: 42 U/L (ref 0–44)
AST: 41 U/L (ref 15–41)
Albumin: 3.6 g/dL (ref 3.5–5.0)
Alkaline Phosphatase: 46 U/L (ref 38–126)
Anion gap: 6 (ref 5–15)
BUN: 14 mg/dL (ref 6–20)
CO2: 27 mmol/L (ref 22–32)
Calcium: 9.1 mg/dL (ref 8.9–10.3)
Chloride: 105 mmol/L (ref 98–111)
Creatinine, Ser: 0.79 mg/dL (ref 0.61–1.24)
GFR, Estimated: >60 mL/min (ref >60)
Glucose, Bld: 109 mg/dL — ABNORMAL HIGH (ref 70–99)
Potassium: 3.8 mmol/L (ref 3.5–5.1)
Sodium: 138 mmol/L (ref 135–145)
Total Bilirubin: 0.5 mg/dL (ref 0.3–1.2)
Total Protein: 6.5 g/dL (ref 6.5–8.1)

## 2020-06-18 LAB — LIPID PANEL
Cholesterol: 286 mg/dL — ABNORMAL HIGH (ref 0–200)
HDL: 23 mg/dL — ABNORMAL LOW (ref >40)
LDL Cholesterol: 219 mg/dL — ABNORMAL HIGH (ref 0–99)
Total CHOL/HDL Ratio: 12.4 RATIO
Triglycerides: 219 mg/dL — ABNORMAL HIGH (ref <150)
VLDL: 44 mg/dL — ABNORMAL HIGH (ref 0–40)

## 2020-06-18 LAB — POC SARS CORONAVIRUS 2 AG: SARS Coronavirus 2 Ag: NEGATIVE

## 2020-06-18 LAB — ETHANOL: Alcohol, Ethyl (B): <10 mg/dL (ref <10)

## 2020-06-18 LAB — TSH: TSH: 3.178 u[IU]/mL (ref 0.350–4.500)

## 2020-06-18 LAB — POC SARS CORONAVIRUS 2 AG -  ED: SARS Coronavirus 2 Ag: NEGATIVE

## 2020-06-18 MED ORDER — MAGNESIUM HYDROXIDE 400 MG/5ML PO SUSP: 30.0000 mL | Freq: Every day | ORAL | Status: DC | PRN

## 2020-06-18 MED ORDER — HYDROXYZINE HCL 50 MG PO TABS: 50.0000 mg | ORAL_TABLET | Freq: Three times a day (TID) | ORAL | 1 refills | Status: AC | PRN

## 2020-06-18 MED ORDER — BENZTROPINE MESYLATE 0.5 MG PO TABS: 0.5000 mg | ORAL_TABLET | Freq: Two times a day (BID) | ORAL | 1 refills | Status: AC

## 2020-06-18 MED ORDER — HALOPERIDOL 5 MG PO TABS
5.0000 mg | ORAL_TABLET | Freq: Two times a day (BID) | ORAL | Status: DC
  Administered 2020-06-18: 5 mg via ORAL
  Filled 2020-06-18: qty 1

## 2020-06-18 MED ORDER — HALOPERIDOL 5 MG PO TABS: 5.0000 mg | ORAL_TABLET | Freq: Two times a day (BID) | ORAL | 1 refills | Status: AC

## 2020-06-18 MED ORDER — ACETAMINOPHEN 325 MG PO TABS: 650.0000 mg | ORAL_TABLET | Freq: Four times a day (QID) | ORAL | Status: DC | PRN

## 2020-06-18 MED ORDER — BENZTROPINE MESYLATE 0.5 MG PO TABS
0.5000 mg | ORAL_TABLET | Freq: Two times a day (BID) | ORAL | Status: DC
  Administered 2020-06-18: 0.5 mg via ORAL
  Filled 2020-06-18: qty 1

## 2020-06-18 MED ORDER — HYDROXYZINE HCL 25 MG PO TABS: 25.0000 mg | ORAL_TABLET | Freq: Three times a day (TID) | ORAL | Status: DC | PRN

## 2020-06-18 MED ORDER — ALUM & MAG HYDROXIDE-SIMETH 200-200-20 MG/5ML PO SUSP: 30.0000 mL | ORAL | Status: DC | PRN

## 2020-06-18 NOTE — Progress Notes (Signed)
CSW attempted to reach patient's guardian to discuss aftercare. He was not reached, unable to leave voicemail.   Signed:  Corky Crafts, MSW, [place], LCASA 06/18/2020 2:08 PM

## 2020-06-18 NOTE — ED Notes (Signed)
Pt continues to rest on the pullout bed. Denies SI, HI or AVH. Converses w/staff minimal but will answer questions appropriately. Safety maintained and will continue to monitor.

## 2020-06-18 NOTE — ED Notes (Signed)
Pt resting in pull out bed w/o any distress. Safety maintained and will continue to monitor.

## 2020-06-18 NOTE — ED Provider Notes (Signed)
FBC/OBS ASAP Discharge Summary  Date and Time: 06/18/2020 2:10 PM  Name: Joshua Davila  MRN:  [id number]   Discharge Diagnoses:  Final diagnoses:  Schizoaffective disorder, bipolar type [facility](HCC)    Subjective: Patient reports that that he is feeling better.  Patient denies any suicidal homicidal ideations and denies any hallucinations.  He reports that he was brought to the hospital because he has been getting a little worse because he has not been on his medications.  He states that the reason he has not continued his medications because he has not had an outpatient provider.  Patient states that he feels that with his medications he can go home. Patient's legal guardian and spouse, Joshua Davila, was contacted at [phone].  He reports that this is been a vicious cycle with the patient because he has been refusing to go to outpatient services.  He states that the patient has veterans services is also gets disability and has Medicaid and [employer] insurance.  He states that the patient will come into an urgent care and then he will receive medications but once he ran out he was services.  He reports that he is trying patient with a service similar to the [facility], however he has to go through the [facility] to get the services.  He states that with the patient being on his medications he has no concerns with the patient discharging home he will be here to pick him up at 5 PM today  Stay Summary: Patient is a 40 year old male with a history of schizoaffective disorder bipolar type presented to the [facility] under IVC.  The IVC reports that the patient is diagnosed with schizophrenia and bipolar disorder has been off his medications for 2 months and has been having auditory hallucinations and threatening to kill himself has became aggressive and threatening.  Patient presented to be very drowsy and sleepy for assessment.  Patient did deny having any suicidal or homicidal ideations and denied any hallucinations assessment  however due to his somnolence as well as the IVCD and noncompliance with medications he was admitted.  His observation unit for overnight assessment.  Patient was restarted on Haldol 5 mg p.o. twice daily, Cogentin 0.5 mg p.o. twice daily Vistaril 25 mg p.o. nightly as needed.  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  Patient slept for most of the day and when he awoke he reported that he was feeling better and felt that he was ready for discharge.  Collateral information and safety planning was established with patient's spouse and legal guardian.  There were no safety concerns with the patient discharging and the patient was provided with 30-day prescriptions of his medications that were E prescribed to pharmacy of choice.  Patient will discharge with his legal guardian and [facility].  Patient's IVC was rescinded  Total Time spent with patient: 30 minutes  Past Psychiatric History: MDD, PTSD, schizoaffective, previous hospitalizations Past Medical History:  Past Medical History:  Diagnosis Date  . Depression   . Gender identity disorder   . Liver disorder   . PTSD (post-traumatic stress disorder)   . Schizoaffective disorder (HCC)    No past surgical history on file. Family History:  Family History  Problem Relation Age of Onset  . Multiple sclerosis Mother   . Mental illness Neg Hx    Family Psychiatric History: None reported Social History:  Social History  Substance and Sexual Activity  Alcohol Use Yes   Comment: occasional     Social History   Substance and Sexual Activity  Drug Use Yes  . Types: Cocaine, Methamphetamines, Marijuana   Comment: History of methamphetamines and marijuana    Social History   Socioeconomic History  . Marital status: Significant Other    Spouse name: Dwayne Davila  . Number of children: Not on file  . Years of education: Not on file  . Highest  education level: Not on file  Occupational History  . Not on file  Tobacco Use  . Smoking status: Current Every Day Smoker    Packs/day: 1.00    Types: Cigarettes  . Smokeless tobacco: Never Used  Substance and Sexual Activity  . Alcohol use: Yes    Comment: occasional  . Drug use: Yes    Types: Cocaine, Methamphetamines, Marijuana    Comment: History of methamphetamines and marijuana  . Sexual activity: Not Currently  Other Topics Concern  . Not on file  Social History Narrative   Pt lives in [place].  Per history, Pt lives with significant other [facility] [phone]).  Joshua Davila stated that he is Pt's Medical POA.   Social Determinants of Health   Financial Resource Strain: Not on file  Food Insecurity: Not on file  Transportation Needs: Not on file  Physical Activity: Not on file  Stress: Not on file  Social Connections: Not on file   SDOH:  SDOH Screenings   Alcohol Screen: Not on file  Depression (DDU2-0): Not on file  Financial Resource Strain: Not on file  Food Insecurity: Not on file  Housing: Not on file  Physical Activity: Not on file  Social Connections: Not on file  Stress: Not on file  Tobacco Use: High Risk  . Smoking Tobacco Use: Current Every Day Smoker  . Smokeless Tobacco Use: Never Used  Transportation Needs: Not on file    Has this patient used any form of tobacco in the last 30 days? (Cigarettes, Smokeless Tobacco, Cigars, and/or Pipes) A prescription for an FDA-approved tobacco cessation medication was offered at discharge and the patient refused  Current Medications:  Current Facility-Administered Medications  Medication Dose Route Frequency Provider Last Rate Last Admin  . acetaminophen (TYLENOL) tablet 650 mg  650 mg Oral Q6H PRN Jaclyn Shaggy, PA-C      . alum & mag hydroxide-simeth (MAALOX/MYLANTA) 200-200-20 MG/5ML suspension 30 mL  30 mL Oral Q4H PRN Melbourne Abts W, PA-C      . benztropine (COGENTIN) tablet 0.5 mg  0.5 mg Oral  BID Chanler Mendonca, Feliz Beam B, FNP   0.5 mg at 06/18/20 1017  . haloperidol (HALDOL) tablet 5 mg  5 mg Oral BID Khristian Seals, Gerlene Burdock, FNP   5 mg at 06/18/20 1017  . hydrOXYzine (ATARAX/VISTARIL) tablet 25 mg  25 mg Oral Q8H PRN Wendelin Reader, Gerlene Burdock, FNP      . magnesium hydroxide (MILK OF MAGNESIA) suspension 30 mL  30 mL Oral Daily PRN Jaclyn Shaggy, PA-C       Current Outpatient Medications  Medication Sig Dispense Refill  . benztropine (COGENTIN) 0.5 MG tablet Take 1 tablet (0.5 mg total) by mouth 2 (two) times daily. 60 tablet 1  . haloperidol (HALDOL) 5 MG tablet Take 1 tablet (5 mg total) by mouth 2 (two) times daily. 60 tablet 1  . hydrOXYzine (ATARAX/VISTARIL) 50 MG tablet Take 1 tablet (50 mg total) by mouth every 8 (eight) hours as needed for  anxiety. 60 tablet 1    PTA Medications: (Not in a hospital admission)   Musculoskeletal  Strength & Muscle Tone: within normal limits Gait & Station: normal Patient leans: N/A  Psychiatric Specialty Exam  Presentation  General Appearance: Disheveled  Eye Contact:Good  Speech:Clear and Coherent; Normal Rate  Speech Volume:Normal  Handedness:Right   Mood and Affect  Mood:Euthymic  Affect:Appropriate; Congruent   Thought Process  Thought Processes:Coherent  Descriptions of Associations:Intact  Orientation:Full (Time, Place and Person)  Thought Content:WDL     Hallucinations:Hallucinations: None  Ideas of Reference:None  Suicidal Thoughts:Suicidal Thoughts: No  Homicidal Thoughts:Homicidal Thoughts: No   Sensorium  Memory:Immediate Good; Recent Good; Remote Good  Judgment:Good  Insight:Good   Executive Functions  Concentration:Good  Attention Span:Good  Recall:Good  Fund of Knowledge:Good  Language:Good   Psychomotor Activity  Psychomotor Activity:Psychomotor Activity: Normal   Assets  Assets:Communication Skills; Desire for Improvement; Financial Resources/Insurance; Housing; Physical Health; Social Support;  Transportation   Sleep  Sleep:Sleep: Good   Nutritional Assessment (For OBS and FBC admissions only) Has the patient had a weight loss or gain of 10 pounds or more in the last 3 months?: -- (UTA- Patient sleeping and difficult to arouse during the assessment.) Has the patient had a decrease in food intake/or appetite?: -- (UTA- Patient sleeping and difficult to arouse during the assessment.) Does the patient have dental problems?: -- (UTA- Patient sleeping and difficult to arouse during the assessment.) Does the patient have eating habits or behaviors that may be indicators of an eating disorder including binging or inducing vomiting?: -- (UTA- Patient sleeping and difficult to arouse during the assessment.) Has the patient recently lost weight without trying?: -- (UTA- Patient sleeping and difficult to arouse during the assessment.) Has the patient been eating poorly because of a decreased appetite?: -- (UTA- Patient sleeping and difficult to arouse during the assessment.)    Physical Exam  Physical Exam Vitals and nursing note reviewed.  Constitutional:      Appearance: He is well-developed.  HENT:     Head: Normocephalic.  Eyes:     Pupils: Pupils are equal, round, and reactive to light.  Cardiovascular:     Rate and Rhythm: Normal rate.  Pulmonary:     Effort: Pulmonary effort is normal.  Musculoskeletal:        General: Normal range of motion.  Neurological:     Mental Status: He is alert and oriented to person, place, and time.    Review of Systems  Constitutional: Negative.   HENT: Negative.   Eyes: Negative.   Respiratory: Negative.   Cardiovascular: Negative.   Gastrointestinal: Negative.   Genitourinary: Negative.   Musculoskeletal: Negative.   Skin: Negative.   Neurological: Negative.   Endo/Heme/Allergies: Negative.   Psychiatric/Behavioral: Negative.    Blood pressure 113/73, pulse (!) 58, temperature 98.4 F (36.9 C), temperature source Oral, resp.  rate 16, SpO2 100 %. There is no height or weight on file to calculate BMI.  Demographic Factors:  Male, Caucasian, Gay, lesbian, or bisexual orientation and Unemployed  Loss Factors: NA  Historical Factors: NA  Risk Reduction Factors:   Sense of responsibility to family, Living with another person, especially a relative, Positive social support, Positive therapeutic relationship and recieves diasability and has [employer] services  Continued Clinical Symptoms:  Previous Psychiatric Diagnoses and Treatments  Cognitive Features That Contribute To Risk:  None    Suicide Risk:  Minimal: No identifiable suicidal ideation.  Patients presenting with no risk factors but  with morbid ruminations; may be classified as minimal risk based on the severity of the depressive symptoms  Plan Of Care/Follow-up recommendations:  Continue activity as tolerated. Continue diet as recommended by your PCP. Ensure to keep all appointments with outpatient providers.  Disposition: Discharge home with legal guardian  Maryfrances Bunnell, FNP 06/18/2020, 2:10 PM

## 2020-06-18 NOTE — ED Provider Notes (Signed)
Behavioral Health Admission H&P [facility](FBC & OBS)  Date: 06/18/20 Patient Name: Joshua Davila MRN: [id number] Chief Complaint: IVC    Diagnoses:  Final diagnoses:  Schizoaffective disorder, bipolar type [facility](HCC)    HPI: Joshua Davila is a 40 year old male with history of schizoaffective disorder, bipolar type who presents to the behavioral health urgent care under IVC.  Patient petitioned for IVC by his spouse [facility](Dwayne Paulino RilyKeith Hill).  Affidavit and petition for involuntary commitment in the patient's IVC paperwork states the following:  "Respondent has been diagnosed with schizophrenia and bipolar disorder/PTSD.  He has been off his medications for two months.  Recently, he has been hearing voices, threatening to kill himself and is becoming physically aggressive/violent with family members.  Respondent was most recently committed in October 2021".  Please see patient's IVC paperwork for further details if necessary.  Upon my assessment of the patient, patient is very drowsy, sleeping, and is difficult to arouse to answer questions during the assessment.  Patient is mostly uncooperative at this time with answering questions during the assessment.  TTS assessment deferred at this time due to the patient's lack of cooperation with the assessment at this time.  When patient is asked why he was brought to the behavioral health urgent care, patient states "police".  When patient is asked to clarify again why he was brought to the behavioral health urgent care, patient makes a statement that is difficult to understand due to slurred speech. Patient denies SI, HI, AVH, paranoia, or delusions on exam.  He reports that he is feeling fine and that he is not having any issues currently at this time.  Patient states that he is not currently taking any psychotropic medications or any additional medications at this time.  Patient is unable to provide a response when asked about how long he has not been taking his medications.   Chart review shows the patient was taking Haldol 5 mg p.o. twice daily, Cogentin 0.5 mg p.o. BID, and Vistaril 25 mg PO PRN q8h in the past. Patient is unable to verify this on exam.  Patient denies any alcohol or substance use.  However, per chart review patient is noted to have used substances such as methamphetamine and alcohol in the past.  He denies chest pain, cough, SOB, N/V, abdominal pain, diarrhea, or constipation, but refuses to answer additional ROS questions at this time. Patient refuses to answer further questions during my assessment at this time.  PHQ 2-9:   [facility]  C-SSRS RISK CATEGORY Error: Q3, 4, or 5 should not be populated when Q2 is No No Risk No Risk       Total Time spent with patient: 15 minutes  Musculoskeletal  Strength & Muscle Tone: within normal limits Gait & Station: normal Patient leans: N/A  Psychiatric Specialty Exam  Portions of the PSE that are labeled as UTA are labeled as such due to the patient's drowsiness level and lack of cooperation with answering questions during the assessment.  Presentation General Appearance: -- (Patient sleeping and difficult to arouse during the assessment.)  Eye Contact:Poor  Speech:Slow; Slurred  Speech Volume:Normal  Handedness:Right   Mood and Affect  Mood:Euthymic  Affect:Non-Congruent; Inappropriate   Thought Process  Thought Processes:-- (UTA- Patient sleeping and difficult to arouse during the assessment.)  Descriptions of Associations:-- (UTA-  Patient sleeping and difficult to arouse during the assessment.)  Orientation:-- (UTA- Patient sleeping and difficult to arouse during the assessment.)  Thought Content:-- (UTA- Patient sleeping and difficult to arouse during the assessment.)     Hallucinations:Hallucinations: None  Ideas of Reference:None  Suicidal Thoughts:Suicidal Thoughts: No  Homicidal Thoughts:Homicidal Thoughts: No   Sensorium  Memory:-- (UTA- Patient sleeping and difficult to arouse during the assessment.)  Judgment:-- (UTA- Patient sleeping and difficult to arouse during the assessment.)  Insight:-- (UTA- Patient sleeping and difficult to arouse during the assessment.)   Executive Functions  Concentration:-- (UTA- Patient sleeping and difficult to arouse during the assessment.)  Attention Span:-- (UTA- Patient sleeping and difficult to arouse during the assessment.)  Recall:-- (UTA- Patient sleeping and difficult to arouse during the assessment.)  Fund of Knowledge:-- (UTA- Patient sleeping and difficult to arouse during the assessment.)  Language:Fair   Psychomotor Activity  Psychomotor Activity:Psychomotor Activity: Normal   Assets  Assets:Financial Resources/Insurance; Leisure Time; Physical Health   Sleep  Sleep:Sleep: -- (UTA- Patient sleeping and difficult to arouse during the assessment.)   Nutritional Assessment (For OBS and FBC admissions only) Has the patient had a weight loss or gain of 10 pounds or more in the last 3 months?: -- (UTA- Patient sleeping and difficult to arouse during the assessment.) Has the patient had a decrease in food intake/or appetite?: -- (UTA- Patient sleeping and difficult to arouse during the assessment.) Does the patient have dental problems?: -- (UTA- Patient sleeping and difficult to arouse during the assessment.) Does the patient have eating habits or behaviors that may be indicators of an eating disorder including binging or inducing vomiting?: -- (UTA- Patient sleeping and difficult to arouse during the assessment.) Has the patient recently lost weight without trying?: -- (UTA- Patient sleeping and difficult to arouse during the assessment.) Has the patient been eating poorly because of a  decreased appetite?: -- (UTA- Patient sleeping and difficult to arouse during the assessment.)    Physical Exam Vitals reviewed.  Constitutional:      General: He is not in acute distress.    Appearance: He is not ill-appearing, toxic-appearing or diaphoretic.  HENT:     Head: Normocephalic and atraumatic.     Right Ear: External ear normal.     Left Ear: External ear normal.  Cardiovascular:     Rate and Rhythm: Normal rate.  Pulmonary:     Effort: Pulmonary effort is normal. No respiratory distress.  Musculoskeletal:        General: Normal range of motion.     Cervical back: Normal range of motion.  Neurological:     Comments: UTA orientation at this time.  Psychiatric:        Attention and Perception: He does not perceive auditory or visual hallucinations.        Speech: Speech is slurred.        Behavior: Behavior is uncooperative. Behavior is not agitated, slowed, aggressive, withdrawn, hyperactive or combative.        Thought Content: Thought content is not paranoid or delusional. Thought content does not include homicidal or suicidal ideation.     Comments: Speech is slow and slurred. UTA judgment and insight at this time.    Review of Systems  Constitutional:       UTA  HENT:       UTA  Respiratory: Negative for cough and shortness of breath.   Cardiovascular: Negative for chest pain.  Gastrointestinal: Negative for abdominal pain, constipation, diarrhea,  nausea and vomiting.  Musculoskeletal:       UTA  Neurological:       UTA  Psychiatric/Behavioral: Positive for hallucinations. Negative for suicidal ideas.       Negative for HI. Patient denies any alcohol or substance use.  However, per chart review patient is noted to have used substances such as methamphetamine and alcohol in the past.    Vitals: Blood pressure 114/77, pulse 77, temperature 98.2 F (36.8 C), temperature source Oral, resp. rate 18, SpO2 98 %. There is no height or weight on file to  calculate BMI.  Past Psychiatric History: Schizoaffective Disorder, Bipolar Type, Subtance induced moos disorder, Methamphetamine use disorder, severe (Per Chart Review) PTSD (Per IVC Paperwork)  Is the patient at risk to self? Yes  Has the patient been a risk to self in the past 6 months? No .    Has the patient been a risk to self within the distant past? No   Is the patient a risk to others? No   Has the patient been a risk to others in the past 6 months? No   Has the patient been a risk to others within the distant past? No   Past Medical History:  Past Medical History:  Diagnosis Date  . Depression   . Gender identity disorder   . Liver disorder   . PTSD (post-traumatic stress disorder)   . Schizoaffective disorder (HCC)    No past surgical history on file.  Family History:  Family History  Problem Relation Age of Onset  . Multiple sclerosis Mother   . Mental illness Neg Hx     Social History:  Social History   Socioeconomic History  . Marital status: Significant Other    Spouse name: [name]  . Number of children: Not on file  . Years of education: Not on file  . Highest education level: Not on file  Occupational History  . Not on file  Tobacco Use  . Smoking status: Current Every Day Smoker    Packs/day: 1.00    Types: Cigarettes  . Smokeless tobacco: Never Used  Substance and Sexual Activity  . Alcohol use: Yes    Comment: occasional  . Drug use: Yes    Types: Cocaine, Methamphetamines, Marijuana    Comment: History of methamphetamines and marijuana  . Sexual activity: Not Currently  Other Topics Concern  . Not on file  Social History Narrative   Pt lives in [place].  Per history, Pt lives with significant other [facility] [phone]).  [name].   Social Determinants of Health   Financial Resource Strain: Not on file  Food Insecurity: Not on file  Transportation Needs: Not on file  Physical Activity:  Not on file  Stress: Not on file  Social Connections: Not on file  Intimate Partner Violence: Not on file    SDOH:  SDOH Screenings   Alcohol Screen: Not on file  Depression (ZDG3-8): Not on file  Financial Resource Strain: Not on file  Food Insecurity: Not on file  Housing: Not on file  Physical Activity: Not on file  Social Connections: Not on file  Stress: Not on file  Tobacco Use: High Risk  . Smoking Tobacco Use: Current Every Day Smoker  . Smokeless Tobacco Use: Never Used  Transportation Needs: Not on file    Last Labs:  Admission on 06/18/2020  Component Date Value Ref Range Status  . SARS Coronavirus 2 Ag  06/18/2020 Negative  Negative Preliminary  . SARS Coronavirus 2 Ag 06/18/2020 NEGATIVE  NEGATIVE Final   Comment: (NOTE) SARS-CoV-2 antigen NOT DETECTED.   Negative results are presumptive.  Negative results do not preclude SARS-CoV-2 infection and should not be used as the sole basis for treatment or other patient management decisions, including infection  control decisions, particularly in the presence of clinical signs and  symptoms consistent with COVID-19, or in those who have been in contact with the virus.  Negative results must be combined with clinical observations, patient history, and epidemiological information. The expected result is Negative.  Fact Sheet for Patients: https://www.jennings-kim.com/  Fact Sheet for Healthcare Providers: https://alexander-rogers.biz/  This test is not yet approved or cleared by the [place] FDA and  has been authorized for detection and/or diagnosis of SARS-CoV-2 by FDA under an Emergency Use Authorization (EUA).  This EUA will remain in effect (meaning this test can be used) for the duration of  the COV                          ID-19 declaration under Section 564(b)(1) of the Act, 21 U.S.C. section 360bbb-3(b)(1), unless the authorization is terminated or revoked sooner.     Admission on 05/05/2020, Discharged on 05/05/2020  Component Date Value Ref Range Status  . Sodium 05/05/2020 137  135 - 145 mmol/L Final  . Potassium 05/05/2020 4.0  3.5 - 5.1 mmol/L Final  . Chloride 05/05/2020 101  98 - 111 mmol/L Final  . CO2 05/05/2020 26  22 - 32 mmol/L Final  . Glucose, Bld 05/05/2020 160* 70 - 99 mg/dL Final   Glucose reference range applies only to samples taken after fasting for at least 8 hours.  . BUN 05/05/2020 8  6 - 20 mg/dL Final  . Creatinine, Ser 05/05/2020 0.82  0.61 - 1.24 mg/dL Final  . Calcium 45/40/9811 9.2  8.9 - 10.3 mg/dL Final  . Total Protein 05/05/2020 6.9  6.5 - 8.1 g/dL Final  . Albumin 91/47/8295 3.8  3.5 - 5.0 g/dL Final  . AST 62/13/0865 30  15 - 41 U/L Final  . ALT 05/05/2020 33  0 - 44 U/L Final  . Alkaline Phosphatase 05/05/2020 54  38 - 126 U/L Final  . Total Bilirubin 05/05/2020 0.7  0.3 - 1.2 mg/dL Final  . GFR, Estimated 05/05/2020 >60  >60 mL/min Final   Comment: (NOTE) Calculated using the CKD-EPI Creatinine Equation (2021)   . Anion gap 05/05/2020 10  5 - 15 Final   Performed at [facility], [address], [place], [place] [address]  . WBC 05/05/2020 6.7  4.0 - 10.5 K/uL Final  . RBC 05/05/2020 4.70  4.22 - 5.81 MIL/uL Final  . Hemoglobin 05/05/2020 13.8  13.0 - 17.0 g/dL Final  . HCT 62/95/2841 41.6  39.0 - 52.0 % Final  . MCV 05/05/2020 88.5  80.0 - 100.0 fL Final  . MCH 05/05/2020 29.4  26.0 - 34.0 pg Final  . MCHC 05/05/2020 33.2  30.0 - 36.0 g/dL Final  . RDW 32/44/0102 13.6  11.5 - 15.5 % Final  . Platelets 05/05/2020 212  150 - 400 K/uL Final  . nRBC 05/05/2020 0.0  0.0 - 0.2 % Final  . Neutrophils Relative % 05/05/2020 65  % Final  . Neutro Abs 05/05/2020 4.4  1.7 - 7.7 K/uL Final  . Lymphocytes Relative 05/05/2020 29  % Final  . Lymphs Abs 05/05/2020 2.0  0.7 - 4.0 K/uL Final  . Monocytes Relative 05/05/2020 5  % Final  . Monocytes Absolute 05/05/2020 0.3  0.1 - 1.0 K/uL Final  . Eosinophils  Relative 05/05/2020 0  % Final  . Eosinophils Absolute 05/05/2020 0.0  0.0 - 0.5 K/uL Final  . Basophils Relative 05/05/2020 0  % Final  . Basophils Absolute 05/05/2020 0.0  0.0 - 0.1 K/uL Final  . Immature Granulocytes 05/05/2020 1  % Final  . Abs Immature Granulocytes 05/05/2020 0.05  0.00 - 0.07 K/uL Final   Performed at [facility], [address], [place], [place] [address]    Allergies: Penicillins and Sulfa antibiotics  PTA Medications: (Not in a hospital admission)   Medical Decision Making  Patient is a 40 year old male with history of schizoaffective disorder, bipolar type who presents to the behavioral health urgent care under IVC (See IVC details in HPI).  Due to patient being involuntarily committed with inability to fully assess the patient at this time due to patient's drowsiness level, sleeping throughout the assessment, and refusing to answer majority of assessment questions, will admit the patient to continuous observation at this time with hopes of obtaining further information upon reevaluation later this morning on 06/18/20.    Recommendations  Based on my evaluation the patient does not appear to have an emergency medical condition.  Patient will be placed in behavioral health urgent care continuous observation/assessment for further stabilization and treatment.  Patient will be reevaluated by the treatment team on 06/18/20 and disposition to be determined at that time.  First examination and recommendation for patient's IVC paperwork to be completed at that time as well.  Recommend that collateral be obtained by speaking with the patient's spouse during the day shift.   Labs/tests ordered and reviewed:  -CBC with differential: Results pending  -CMP: Results pending  -Ethanol level: Results pending  -UDS: Patient did not provide a urine specimen.  Recommend that nursing attempt to obtain urine specimen from the patient during the day on June 18, 2020  -Hemoglobin A1c, TSH, and lipid panel ordered due to patient's history of taking antipsychotic medication (Haldol): Results pending  -EKG ordered to assess the patient's QTC due to patient's history of taking antipsychotic medication.  Due to patient's current presentation, nursing unable to obtain EKG on the patient at this time.  Recommend that nursing attempt to obtain EKG during the day on June 18, 2020.  Due to patient's level of drowsiness at this time, will not order the patient's home medication of Haldol or Cogentin at this time.  No Vistaril or trazodone ordered at this time either due to patient's level of drowsiness.  Jaclyn Shaggy, PA-C 06/18/20  5:45 AM

## 2020-06-18 NOTE — ED Notes (Signed)
Discharge instructions provided and Pt stated understanding. No personal belongings from a locker to be returned. Pt alert, orient and ambulatory. Safety maintained and Pt escorted to the front lobby to meet his significant other.

## 2020-06-18 NOTE — ED Notes (Signed)
GIVEN LUNCH 

## 2020-06-18 NOTE — BH Assessment (Signed)
Pt was IVCed by his husband; the IVC states:  "Respondent has been diagnosed with schizophrenia and bipolar disorder/PTSD. He has been off his medication for two months. Recently, he has been hearing voices, threatening to kill himself, and is becoming physically aggressive/violent with family members. Respondent was most recently committed in October 2021."  Pt was asleep on the couch in the assessment room. Clinician attempted to wake pt to complete the behavioral health assessment but pt was unable to become aroused. TTS will attempt assessment at a later time.

## 2020-06-18 NOTE — Discharge Instructions (Addendum)

## 2020-06-18 NOTE — ED Notes (Signed)
Pt given 2 nutri grain bars. 

## 2020-06-18 NOTE — ED Triage Notes (Signed)
Pt accompanied by [employer], under IVC, presents  With auditory hallucinations, becoming physically aggressive and threatening to kill self. And noncompliant with meds.  Skin search completed, monitoring for safety.

## 2020-06-19 ENCOUNTER — Telehealth (HOSPITAL_COMMUNITY): Payer: Self-pay | Admitting: Emergency Medicine

## 2020-06-19 NOTE — BH Assessment (Signed)
Care Management - Follow Up [facility] Discharges   Writer attempted to make contact with patient legal guardian today and was unsuccessful.  Unable to leave a voice mail message.

## 2020-06-20 ENCOUNTER — Encounter (HOSPITAL_COMMUNITY): Payer: Self-pay | Admitting: Emergency Medicine

## 2020-06-20 ENCOUNTER — Other Ambulatory Visit: Payer: Self-pay

## 2020-06-20 ENCOUNTER — Emergency Department (HOSPITAL_COMMUNITY): Payer: Medicare Other

## 2020-06-20 ENCOUNTER — Emergency Department (HOSPITAL_COMMUNITY)
Admission: EM | Admit: 2020-06-20 | Discharge: 2020-06-20 | Disposition: A | Discharge status: Home / Self Care | Payer: Medicare Other | Attending: Emergency Medicine | Admitting: Emergency Medicine

## 2020-06-20 DIAGNOSIS — M545 Low back pain, unspecified: Secondary | ICD-10-CM | POA: Diagnosis not present

## 2020-06-20 DIAGNOSIS — M5441 Lumbago with sciatica, right side: Secondary | ICD-10-CM | POA: Diagnosis not present

## 2020-06-20 DIAGNOSIS — M549 Dorsalgia, unspecified: Secondary | ICD-10-CM | POA: Diagnosis not present

## 2020-06-20 DIAGNOSIS — F17210 Nicotine dependence, cigarettes, uncomplicated: Secondary | ICD-10-CM | POA: Insufficient documentation

## 2020-06-20 MED ORDER — METHOCARBAMOL 500 MG PO TABS
500.0000 mg | ORAL_TABLET | Freq: Once | ORAL | Status: AC
  Administered 2020-06-20: 500 mg via ORAL
  Filled 2020-06-20: qty 1

## 2020-06-20 MED ORDER — LIDOCAINE 5 % EX PTCH
1.0000 | MEDICATED_PATCH | CUTANEOUS | Status: DC
  Administered 2020-06-20: 1 via TRANSDERMAL
  Filled 2020-06-20: qty 1

## 2020-06-20 MED ORDER — METHOCARBAMOL 500 MG PO TABS: 500.0000 mg | ORAL_TABLET | Freq: Two times a day (BID) | ORAL | 0 refills | Status: AC | PRN

## 2020-06-20 MED ORDER — LIDOCAINE 5 % EX PTCH: 1.0000 | MEDICATED_PATCH | CUTANEOUS | 0 refills | Status: AC

## 2020-06-20 MED ORDER — PREDNISONE 10 MG (21) PO TBPK: ORAL_TABLET | Freq: Every day | ORAL | 0 refills | Status: AC

## 2020-06-20 MED ORDER — KETOROLAC TROMETHAMINE 15 MG/ML IJ SOLN
15.0000 mg | Freq: Once | INTRAMUSCULAR | Status: AC
  Administered 2020-06-20: 15 mg via INTRAMUSCULAR
  Filled 2020-06-20: qty 1

## 2020-06-20 NOTE — ED Notes (Signed)
Pt ambulated in bathroom

## 2020-06-20 NOTE — ED Provider Notes (Signed)
[facility]-EMERGENCY DEPT Provider Note   CSN: [id number] Arrival date & time: 06/20/20  1131     History Chief Complaint  Patient presents with  . Back Pain    Joshua Davila is a 40 y.o. male presenting for evaluation of back pain.  Patient states he developed back pain yesterday while he was sitting down.  He does not know what triggered it.  No fall, trauma, or injury.  He has never had pain like this before.  It is in his low mid back and on the right side.  It shoots down his leg occasionally.  Is worse with movement.  He took 3 ibuprofen, does not know if this helped her symptoms.  He denies pain on the left side.  He denies fever, chills, nausea, vomiting, Donnell pain, urinary symptoms, normal bowel movements, loss of bowel bladder control, numbness, tingling.  No history of cancer or IVDU.  Additional history obtained in chart review.  Patient with a history of schizoaffective disorder, PTSD, depression, polysubstance abuse including alcohol and meth.  No documented history of IVDU. Pt was found to have upper ext DVT in 04/2020, was put on eliquis but is not taking it currently.   HPI     Past Medical History:  Diagnosis Date  . Depression   . Gender identity disorder   . Liver disorder   . PTSD (post-traumatic stress disorder)   . Schizoaffective disorder [facility])     Patient Active Problem List   Diagnosis Date Noted  . 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 Schizoaffective disorder (HCC) 11/03/2018  . Aggressive behavior   . Alcohol use disorder, severe, in early remission, in controlled environment (HCC) 04/22/2017  . Polysubstance abuse (HCC)   . Schizoaffective disorder, bipolar type (HCC) 10/02/2015  . Involuntary commitment 10/02/2015  . Cannabis use disorder, moderate, dependence (HCC) 09/16/2015  . Opioid use disorder, moderate, dependence (HCC) 09/16/2015  . Substance or medication-induced bipolar and related disorder with onset  during intoxication (HCC) 09/16/2015  . 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 Benzodiazepine dependence (HCC) 10/04/2013  . PTSD (post-traumatic stress disorder) 10/03/2013  . Substance induced mood disorder (HCC) 10/03/2013  . 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 No pertinent surgical history.     Family History  Problem Relation Age of Onset  . Multiple sclerosis Mother   . Mental illness Neg Hx     Social History   Tobacco Use  . Smoking status: Current Every Day Smoker    Packs/day: 1.00    Types: Cigarettes  . Smokeless tobacco: Never Used  Substance Use Topics  . Alcohol use: Yes    Comment: occasional  . Drug use: Yes    Types: Cocaine, Methamphetamines, Marijuana    Comment: History of methamphetamines and marijuana    Home Medications Prior to Admission medications   Medication Sig Start Date End Date Taking? Authorizing Provider  lidocaine (LIDODERM) 5 % Place 1 patch onto the skin daily. Remove & Discard patch within 12 hours or as directed by MD 06/20/20  Yes Aranda Bihm, PA-C  methocarbamol (ROBAXIN) 500 MG tablet Take 1 tablet (500 mg total) by mouth 2 (two) times daily as needed for muscle spasms. 06/20/20  Yes Kayvion Arneson, PA-C  predniSONE (STERAPRED UNI-PAK 21 TAB) 10 MG (21) TBPK tablet Take by mouth daily. Take 6 tabs by mouth daily  for 2 days, then 5 tabs for 2 days, then 4 tabs for 2 days, then 3 tabs for 2 days, 2 tabs for 2 days, then 1 tab by mouth daily for 2 days 06/20/20  Yes Tamaj Jurgens, PA-C  benztropine (COGENTIN) 0.5 MG tablet Take 1 tablet (0.5 mg total) by mouth 2 (two) times daily. 06/18/20   Money, Gerlene Burdock, FNP  haloperidol (HALDOL) 5 MG tablet Take 1 tablet (5 mg total) by mouth 2 (two) times daily. 06/18/20   Money, Gerlene Burdock, FNP  hydrOXYzine (ATARAX/VISTARIL)  50 MG tablet Take 1 tablet (50 mg total) by mouth every 8 (eight) hours as needed for anxiety. 06/18/20   Money, Gerlene Burdock, FNP    Allergies    Penicillins and Sulfa antibiotics  Review of Systems   Review of Systems  Musculoskeletal: Positive for back pain.  All other systems reviewed and are negative.   Physical Exam Updated Vital Signs BP 115/82 (BP Location: Left Arm)   Pulse 62   Temp 98.3 F (36.8 C) (Oral)   Resp 18   SpO2 99%   Physical Exam Vitals and nursing note reviewed.  Constitutional:      General: He is not in acute distress.    Appearance: He is well-developed.     Comments: Resting in the chair in NAD  HENT:     Head: Normocephalic and atraumatic.  Eyes:     Extraocular Movements: Extraocular movements intact.     Conjunctiva/sclera: Conjunctivae normal.     Pupils: Pupils are equal, round, and reactive to light.  Cardiovascular:     Rate and Rhythm: Normal rate and regular rhythm.     Pulses: Normal pulses.  Pulmonary:     Effort: Pulmonary effort is normal. No respiratory distress.     Breath sounds: Normal breath sounds. No wheezing.  Abdominal:     General: There is no distension.     Palpations: Abdomen is soft. There is no mass.     Tenderness: There is no abdominal tenderness. There is no guarding or rebound.  Musculoskeletal:        General: Normal range of motion.     Cervical back: Normal range of motion and neck supple.     Comments: No focal ttp on my exam. No step offs or deformities. Pt with difficulty standing up from sitting due to pain. Patellar reflexes equal bilaterally. No saddle anesthesia.  Skin:    General: Skin is warm and dry.     Capillary Refill: Capillary refill takes less than 2 seconds.  Neurological:     Mental Status: He is alert and oriented to person, place, and time.     ED Results / Procedures / Treatments   Labs (all labs ordered are listed, but only abnormal results are displayed) Labs Reviewed - No data  to display  EKG None  Radiology DG Lumbar Spine Complete  Result Date: 06/20/2020 CLINICAL DATA:  Low back pain EXAM: LUMBAR SPINE - COMPLETE 4+ VIEW COMPARISON:  None. FINDINGS: There are 5 non rib-bearing lumbar type vertebral bodies. The vertebral body heights and disc spaces are well preserved. No fracture or dislocation. Aortic atherosclerosis. IMPRESSION: 1. No acute findings. Electronically Signed   By: Signa Kell M.D.   On: 06/20/2020 13:53    Procedures Procedures   Medications Ordered in ED Medications  lidocaine (LIDODERM) 5 % 1 patch (1 patch Transdermal Patch Applied 06/20/20 1234)  ketorolac (TORADOL) 15 MG/ML injection 15 mg (15 mg Intramuscular  Given 06/20/20 1236)  methocarbamol (ROBAXIN) tablet 500 mg (500 mg Oral Given 06/20/20 1234)    ED Course  I have reviewed the triage vital signs and the nursing notes.  Pertinent labs & imaging results that were available during my care of the patient were reviewed by me and considered in my medical decision making (see chart for details).    MDM Rules/Calculators/A&P                          Patient resenting for evaluation of back pain.  On exam, patient appears nontoxic.  He has no neurologic deficits.  No red flags of back pain.  He has worsened pain with movement, likely MSK.  However he does report it shoots down his leg, consider sciatica.  However patient is not the best historian and has a history of polysubstance abuse, as such, will obtain x-rays to ensure no bony abnormality.  X-rays viewed interpreted by me, no fracture dislocation.  On reassessment, patient reports he is feeling better.  He was sleeping when I first walked in the room, appears more comfortable.  Discussed possible sciatica.  Discussed symptomatic treatment and close follow-up with PCP.  Case discussed via telephone with patient's spouse/legal guardian, Mr Loleta Chance.  At this time, patient appears safe for discharge.  Return precautions given.   Patient states he understands and agrees to plan.  Final Clinical Impression(s) / ED Diagnoses Final diagnoses:  Acute right-sided low back pain with right-sided sciatica    Rx / DC Orders ED Discharge Orders         Ordered    predniSONE (STERAPRED UNI-PAK 21 TAB) 10 MG (21) TBPK tablet  Daily        06/20/20 1426    methocarbamol (ROBAXIN) 500 MG tablet  2 times daily PRN        06/20/20 1426    lidocaine (LIDODERM) 5 %  Every 24 hours        06/20/20 1427           Giuliano Preece, PA-C 06/20/20 1430    Mancel Bale, MD 06/20/20 1537

## 2020-06-20 NOTE — ED Notes (Signed)
Pt given turkey sandwich

## 2020-06-20 NOTE — Discharge Instructions (Addendum)
Take prednisone as prescribed. Do not take other anti-inflammatories at the same time (Advil, Motrin, ibuprofen, Aleve). You may supplement with Tylenol if you need further pain control. Use Robaxin as needed for muscle stiffness or soreness. Have caution, as this may make you tired or groggy. Do not driver or operate heavy machinery while taking this medication.  Use Lidoderm patches to help with pain.  If this is too expensive, you may asked the pharmacist for over-the-counter options that may be more affordable. Do gentle stretches, permission before, to help with pain. Use heat and/or ice, whichever makes it feel better. Follow up with your primary care doctor if pain is not improving with this treatment.  Return to the ER if you develop high fevers, numbness, loss of bowel or bladder control, or any new or concerning symptoms.

## 2020-06-20 NOTE — ED Triage Notes (Signed)
Per [facility] pt from home for lower back pain that started last night. Denies any injuries or lifting to cause pain.

## 2020-08-22 ENCOUNTER — Encounter (HOSPITAL_COMMUNITY): Payer: Self-pay | Admitting: Emergency Medicine

## 2020-08-22 ENCOUNTER — Ambulatory Visit (HOSPITAL_COMMUNITY): Admission: EM | Admit: 2020-08-22 | Discharge: 2020-08-22 | Payer: Medicare Other

## 2020-08-22 ENCOUNTER — Emergency Department (HOSPITAL_COMMUNITY)
Admission: EM | Admit: 2020-08-22 | Discharge: 2020-08-23 | Disposition: A | Discharge status: Home / Self Care | Payer: Medicare Other | Attending: Emergency Medicine | Admitting: Emergency Medicine

## 2020-08-22 ENCOUNTER — Other Ambulatory Visit: Payer: Self-pay

## 2020-08-22 DIAGNOSIS — M79645 Pain in left finger(s): Secondary | ICD-10-CM

## 2020-08-22 DIAGNOSIS — W4904XA Ring or other jewelry causing external constriction, initial encounter: Secondary | ICD-10-CM | POA: Diagnosis not present

## 2020-08-22 DIAGNOSIS — F17210 Nicotine dependence, cigarettes, uncomplicated: Secondary | ICD-10-CM | POA: Insufficient documentation

## 2020-08-22 DIAGNOSIS — S60445A External constriction of left ring finger, initial encounter: Secondary | ICD-10-CM | POA: Diagnosis not present

## 2020-08-22 DIAGNOSIS — S60411A Abrasion of left index finger, initial encounter: Secondary | ICD-10-CM | POA: Insufficient documentation

## 2020-08-22 DIAGNOSIS — L03119 Cellulitis of unspecified part of limb: Secondary | ICD-10-CM

## 2020-08-22 DIAGNOSIS — L02512 Cutaneous abscess of left hand: Secondary | ICD-10-CM | POA: Diagnosis not present

## 2020-08-22 DIAGNOSIS — L02519 Cutaneous abscess of unspecified hand: Secondary | ICD-10-CM

## 2020-08-22 DIAGNOSIS — S6992XA Unspecified injury of left wrist, hand and finger(s), initial encounter: Secondary | ICD-10-CM | POA: Diagnosis present

## 2020-08-22 DIAGNOSIS — L03114 Cellulitis of left upper limb: Secondary | ICD-10-CM | POA: Insufficient documentation

## 2020-08-22 DIAGNOSIS — S60441A External constriction of left index finger, initial encounter: Secondary | ICD-10-CM | POA: Diagnosis not present

## 2020-08-22 DIAGNOSIS — L03012 Cellulitis of left finger: Secondary | ICD-10-CM | POA: Diagnosis not present

## 2020-08-22 NOTE — ED Provider Notes (Signed)
Emergency Medicine Provider Triage Evaluation Note  Joshua Davila , a 40 y.o. male  was evaluated in triage.  Pt complains of ring stuck on left index finger for the past week.  Reports pain and swelling.  He tried to remove the ring at home without success.  Review of Systems  Positive: Ring stuck on finger, finger pain Negative: Numbness  Physical Exam  BP (!) 132/94 (BP Location: Right Arm)   Pulse 99   Temp 98.7 F (37.1 C) (Oral)   Resp 16   SpO2 99%  Gen:   Awake, no distress Resp:  Normal effort MSK:   Moves extremities without difficulty Other:  Ring stuck on left index finger with swelling and erythema  Medical Decision Making  Medically screening exam initiated at 8:53 PM.  Appropriate orders placed.  [name] was informed that the remainder of the evaluation will be completed by another provider, this initial triage assessment does not replace that evaluation, and the importance of remaining in the ED until their evaluation is complete.  Will need removal   Dietrich Pates, PA-C 08/22/20 2053    Gwyneth Sprout, MD 08/23/20 [phone]

## 2020-08-22 NOTE — ED Triage Notes (Signed)
Pt reports left index finger swelling and redness with ring stuck for the past week.

## 2020-08-22 NOTE — ED Triage Notes (Signed)
Left index finger with ring on it and unable to remove.  Swollen and redness to finger

## 2020-08-22 NOTE — ED Provider Notes (Signed)
[facility]    CSN: [id number] Arrival date & time: 08/22/20  1913      History   Chief Complaint Chief Complaint  Patient presents with  . ring stuck on finger    HPI Joshua Davila is a 40 y.o. male.   Patient presents with left index ring finger that has been stuck since yesterday evening. Has become swollen red and painful. Attempted to remove himself but was unable, are has scant amount of blood at site. Denies numbness and tingling.   Past Medical History:  Diagnosis Date  . Depression   . Gender identity disorder   . Liver disorder   . PTSD (post-traumatic stress disorder)   . Schizoaffective disorder [facility])     Patient Active Problem List   Diagnosis Date Noted  . 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 Schizoaffective disorder (HCC) 11/03/2018  . Aggressive behavior   . Alcohol use disorder, severe, in early remission, in controlled environment (HCC) 04/22/2017  . Polysubstance abuse (HCC)   . Schizoaffective disorder, bipolar type (HCC) 10/02/2015  . Involuntary commitment 10/02/2015  . Cannabis use disorder, moderate, dependence (HCC) 09/16/2015  . Opioid use disorder, moderate, dependence (HCC) 09/16/2015  . Substance or medication-induced bipolar and related disorder with onset during intoxication (HCC) 09/16/2015  . 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 Benzodiazepine dependence (HCC) 10/04/2013  . PTSD (post-traumatic stress disorder) 10/03/2013  . Substance induced mood disorder (HCC) 10/03/2013  . 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 No pertinent surgical history.     Home Medications    Prior to Admission medications   Medication Sig Start Date End Date Taking? Authorizing Provider  benztropine (COGENTIN) 0.5 MG tablet Take 1 tablet (0.5 mg total) by mouth 2 (two)  times daily. 06/18/20   Money, Gerlene Burdock, FNP  haloperidol (HALDOL) 5 MG tablet Take 1 tablet (5 mg total) by mouth 2 (two) times daily. 06/18/20   Money, Gerlene Burdock, FNP  hydrOXYzine (ATARAX/VISTARIL) 50 MG tablet Take 1 tablet (50 mg total) by mouth every 8 (eight) hours as needed for anxiety. 06/18/20   Money, Gerlene Burdock, FNP  lidocaine (LIDODERM) 5 % Place 1 patch onto the skin daily. Remove & Discard patch within 12 hours or as directed by MD 06/20/20   Caccavale, Sophia, PA-C  methocarbamol (ROBAXIN) 500 MG tablet Take 1 tablet (500 mg total) by mouth 2 (two) times daily as needed for muscle spasms. 06/20/20   Caccavale, Sophia, PA-C  predniSONE (STERAPRED UNI-PAK 21 TAB) 10 MG (21) TBPK tablet Take by mouth daily. Take 6 tabs by mouth daily  for 2 days, then 5 tabs for 2 days, then 4 tabs for 2 days, then 3 tabs for 2 days, 2 tabs for 2 days, then 1 tab by mouth daily for 2 days 06/20/20   Caccavale, Sophia, PA-C    Family History Family History  Problem Relation Age of Onset  . Multiple sclerosis Mother   . Mental illness Neg Hx     Social History Social History   Tobacco Use  . Smoking status: Current Every Day Smoker    Packs/day: 1.00    Types: Cigarettes  . Smokeless tobacco: Never Used  Substance Use Topics  . Alcohol use: Yes    Comment: occasional  . Drug use: Yes    Types: Cocaine, Methamphetamines, Marijuana    Comment: History  of methamphetamines and marijuana     Allergies   Penicillins and Sulfa antibiotics   Review of Systems Review of Systems   Physical Exam Triage Vital Signs ED Triage Vitals  Enc Vitals Group     BP 08/22/20 1939 127/83     Pulse Rate 08/22/20 1939 (!) 101     Resp 08/22/20 1939 18     Temp 08/22/20 1939 99.6 F (37.6 C)     Temp Source 08/22/20 1939 Oral     SpO2 08/22/20 1939 100 %     Weight --      Height --      Head Circumference --      Peak Flow --      Pain Score 08/22/20 1936 0     Pain Loc --      Pain Edu? --       Excl. in GC? --    No data found.  Updated Vital Signs BP 127/83 (BP Location: Right Arm)   Pulse (!) 101   Temp 99.6 F (37.6 C) (Oral)   Resp 18   SpO2 100%   Visual Acuity Right Eye Distance:   Left Eye Distance:   Bilateral Distance:    Right Eye Near:   Left Eye Near:    Bilateral Near:     Physical Exam   UC Treatments / Results  Labs (all labs ordered are listed, but only abnormal results are displayed) Labs Reviewed - No data to display  EKG   Radiology No results found.  Procedures Procedures (including critical care time)  Medications Ordered in UC Medications - No data to display  Initial Impression / Assessment and Plan / UC Course  I have reviewed the triage vital signs and the nursing notes.  Pertinent labs & imaging results that were available during my care of the patient were reviewed by me and considered in my medical decision making (see chart for details).  Sent to emergency department for removal of ring due to lack of equipment available to complete procedure. Stressed importance of ring being removed as soon as possible. Patient became agitated and stormed out of room.  Final Clinical Impressions(s) / UC Diagnoses   Final diagnoses:  None   Discharge Instructions   None    ED Prescriptions    None     PDMP not reviewed this encounter.   Valinda Hoar, NP 08/22/20 [phone]

## 2020-08-23 MED ORDER — DOXYCYCLINE HYCLATE 100 MG PO CAPS: 100.0000 mg | ORAL_CAPSULE | Freq: Two times a day (BID) | ORAL | 0 refills | Status: AC

## 2020-08-23 MED ORDER — DOXYCYCLINE HYCLATE 100 MG PO TABS
100.0000 mg | ORAL_TABLET | Freq: Once | ORAL | Status: AC
  Administered 2020-08-23: 100 mg via ORAL
  Filled 2020-08-23: qty 1

## 2020-08-23 NOTE — ED Notes (Signed)
Pt discharged and ambulated out of the ED without difficulty. 

## 2020-08-23 NOTE — Discharge Instructions (Addendum)
Take the antibiotic as prescribed. Continue cold compresses to reduce swelling.   If you develop worsening swelling of the finger or hand, have significantly increased pain, develop a fever, have any numbness of the index finger or if the finger turns a dark or bluish color, return to the emergency department for further evaluation.

## 2020-08-23 NOTE — ED Provider Notes (Signed)
[facility] EMERGENCY DEPARTMENT Provider Note   CSN: [id number] Arrival date & time: 08/22/20  2011     History Chief Complaint  Patient presents with  . Ring Stuck on Finger    Joshua Davila is a 40 y.o. male.  Patient to ED to have a ring removed on his left index finger. He reports increased swelling of the hand when he tried to remove it and the swelling has continued to worsen. Partner reports the ring is titanium.   The history is provided by the patient. No language interpreter was used.       Past Medical History:  Diagnosis Date  . Depression   . Gender identity disorder   . Liver disorder   . PTSD (post-traumatic stress disorder)   . Schizoaffective disorder [place])     Patient Active Problem List   Diagnosis Date Noted  . 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 Schizoaffective disorder (HCC) 11/03/2018  . Aggressive behavior   . Alcohol use disorder, severe, in early remission, in controlled environment (HCC) 04/22/2017  . Polysubstance abuse (HCC)   . Schizoaffective disorder, bipolar type (HCC) 10/02/2015  . Involuntary commitment 10/02/2015  . Cannabis use disorder, moderate, dependence (HCC) 09/16/2015  . Opioid use disorder, moderate, dependence (HCC) 09/16/2015  . Substance or medication-induced bipolar and related disorder with onset during intoxication (HCC) 09/16/2015  . 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 Benzodiazepine dependence (HCC) 10/04/2013  . PTSD (post-traumatic stress disorder) 10/03/2013  . Substance induced mood disorder (HCC) 10/03/2013  . 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 No pertinent surgical history.     Family History  Problem Relation Age of Onset  . Multiple sclerosis Mother   . Mental illness Neg Hx     Social History    Tobacco Use  . Smoking status: Current Every Day Smoker    Packs/day: 1.00    Types: Cigarettes  . Smokeless tobacco: Never Used  Substance Use Topics  . Alcohol use: Yes    Comment: occasional  . Drug use: Yes    Types: Cocaine, Methamphetamines, Marijuana    Comment: History of methamphetamines and marijuana    Home Medications Prior to Admission medications   Medication Sig Start Date End Date Taking? Authorizing Provider  benztropine (COGENTIN) 0.5 MG tablet Take 1 tablet (0.5 mg total) by mouth 2 (two) times daily. 06/18/20   Money, Gerlene Burdock, FNP  haloperidol (HALDOL) 5 MG tablet Take 1 tablet (5 mg total) by mouth 2 (two) times daily. 06/18/20   Money, Gerlene Burdock, FNP  hydrOXYzine (ATARAX/VISTARIL) 50 MG tablet Take 1 tablet (50 mg total) by mouth every 8 (eight) hours as needed for anxiety. 06/18/20   Money, Gerlene Burdock, FNP  lidocaine (LIDODERM) 5 % Place 1 patch onto the skin daily. Remove & Discard patch within 12 hours or as directed by MD 06/20/20   Caccavale, Sophia, PA-C  methocarbamol (ROBAXIN) 500 MG tablet Take 1 tablet (500 mg total) by mouth 2 (two) times daily as needed for muscle spasms. 06/20/20   Caccavale, Sophia, PA-C  predniSONE (STERAPRED UNI-PAK 21 TAB) 10 MG (21) TBPK tablet Take by mouth daily. Take 6 tabs by mouth daily  for 2 days, then 5 tabs for 2 days, then 4 tabs for 2 days, then 3 tabs for 2 days, 2 tabs for 2 days, then 1 tab  by mouth daily for 2 days 06/20/20   Caccavale, Sophia, PA-C    Allergies    Penicillins and Sulfa antibiotics  Review of Systems   Review of Systems  Musculoskeletal:       See HPI.  Skin: Positive for color change.  Neurological: Negative for numbness.    Physical Exam Updated Vital Signs BP (!) 131/94 (BP Location: Left Arm)   Pulse (!) 112   Temp 98.7 F (37.1 C) (Oral)   Resp 16   SpO2 100%   Physical Exam Constitutional:      Appearance: He is well-developed.  Pulmonary:     Effort: Pulmonary effort is normal.   Musculoskeletal:        General: Normal range of motion.     Cervical back: Normal range of motion.     Comments: Left index finger markedly swollen over proximal phalynx constricted by solid ring. Swelling continues to dorsum. There is mild erythema of the finger and dorsal hand. No bleeding or wound. Distal cap RF <2s.  Skin:    General: Skin is warm and dry.     Findings: Erythema present.  Neurological:     Mental Status: He is alert and oriented to person, place, and time.     Sensory: No sensory deficit.     ED Results / Procedures / Treatments   Labs (all labs ordered are listed, but only abnormal results are displayed) Labs Reviewed - No data to display  EKG None  Radiology No results found.  Procedures Procedures   Medications Ordered in ED Medications - No data to display  ED Course  I have reviewed the triage vital signs and the nursing notes.  Pertinent labs & imaging results that were available during my care of the patient were reviewed by me and considered in my medical decision making (see chart for details).    MDM Rules/Calculators/A&P                          Ring cutter used to remove the ring. There are superficial abrasions under ring surface without skin breakdown, bleeding or drainage. There are concerning erythematous areas to finger and dorsum of hands.   Will cover for secondary infection. No neurovascular compromise to the finger.   Final Clinical Impression(s) / ED Diagnoses Final diagnoses:  None   1. Ring causing external constriction of finger 2. Cellulitis of hand  Rx / DC Orders ED Discharge Orders    None       Elpidio Anis, PA-C 08/28/20 [id number]    Geoffery Lyons, MD 08/28/20 2302

## 2020-09-30 ENCOUNTER — Emergency Department (HOSPITAL_COMMUNITY)
Admission: EM | Admit: 2020-09-30 | Discharge: 2020-09-30 | Disposition: A | Discharge status: Home / Self Care | Payer: Medicare Other | Attending: Emergency Medicine | Admitting: Emergency Medicine

## 2020-09-30 ENCOUNTER — Encounter (HOSPITAL_COMMUNITY): Payer: Self-pay | Admitting: *Deleted

## 2020-09-30 ENCOUNTER — Other Ambulatory Visit: Payer: Self-pay

## 2020-09-30 DIAGNOSIS — Z5321 Procedure and treatment not carried out due to patient leaving prior to being seen by health care provider: Secondary | ICD-10-CM | POA: Insufficient documentation

## 2020-09-30 DIAGNOSIS — R440 Auditory hallucinations: Secondary | ICD-10-CM | POA: Diagnosis not present

## 2020-09-30 DIAGNOSIS — R441 Visual hallucinations: Secondary | ICD-10-CM | POA: Insufficient documentation

## 2020-09-30 NOTE — ED Notes (Signed)
Patient was brought back to [facility] and stated that he only wanted a drink of water and was wanting to go to the [place]. Wanted out after multiple attempts to try to get patient to stay. Charge RN aware patient has a legal guardian. Attempting to get patient back into ED.

## 2020-09-30 NOTE — ED Notes (Signed)
Attempted to call pts legal gaurdian at number provided in the chart Joshua Davila [phone]) times 2 - both calls went to voicemail

## 2020-09-30 NOTE — ED Notes (Signed)
Asked case management to assist in finding pts gaurdian. CM looked back in record and states pt has signed for self recently. Registration also assisting and messaged contact  through epic system to call the hospital. No response.

## 2020-09-30 NOTE — ED Notes (Signed)
Pt walking out of ED and sitting in lobby. States he 'can't sit still and wants a coke'. Pt given a sandwich and a coke. CM out to the lobby to speak with pt. Per EMT in lobby, pt on the wall phone and left shortly after.

## 2020-09-30 NOTE — ED Triage Notes (Signed)
Pt appears to be having visual and auditory hallucinations. Pts only complaint is that "his family is crazy and needs help." He denies SI or HI. Has hx of schizophrenia but denies being off his meds for unknown time.

## 2020-09-30 NOTE — Progress Notes (Signed)
CSW received a call from ED secretary that ED RN was unable to reach patients legal guardian. Patients legal guardian is his husband. CSW also tried to contact the husband but the phone went straight to voicemail. CSW contacted [facility] of Court to see if patients legal guardian is still active. CSW was told [name] is the legal guardian. The clerks office had no contact information for [name]. CSW attempted to speak with patient but was told patient left and might be in the lobby. CSW went to the lobby and could not find patient. CSW spoke with lobby staff and they also have not seen patient since he checked in.

## 2020-10-02 ENCOUNTER — Emergency Department (HOSPITAL_COMMUNITY)
Admission: EM | Admit: 2020-10-02 | Discharge: 2020-10-02 | Discharge status: Against Medical Advice | Payer: Medicare Other

## 2020-10-02 NOTE — ED Notes (Signed)
Called for triage x3, no response. 

## 2021-02-20 DIAGNOSIS — R202 Paresthesia of skin: Secondary | ICD-10-CM | POA: Diagnosis not present

## 2021-02-20 DIAGNOSIS — F419 Anxiety disorder, unspecified: Secondary | ICD-10-CM | POA: Diagnosis not present

## 2021-02-20 DIAGNOSIS — Z86718 Personal history of other venous thrombosis and embolism: Secondary | ICD-10-CM | POA: Diagnosis not present

## 2021-02-20 DIAGNOSIS — Z79899 Other long term (current) drug therapy: Secondary | ICD-10-CM | POA: Diagnosis not present

## 2021-02-21 DIAGNOSIS — Z86718 Personal history of other venous thrombosis and embolism: Secondary | ICD-10-CM | POA: Diagnosis not present

## 2021-12-15 ENCOUNTER — Other Ambulatory Visit (HOSPITAL_COMMUNITY): Payer: Self-pay | Admitting: Hematology & Oncology

## 2021-12-15 DIAGNOSIS — F209 Schizophrenia, unspecified: Secondary | ICD-10-CM

## 2021-12-15 DIAGNOSIS — F1990 Other psychoactive substance use, unspecified, uncomplicated: Secondary | ICD-10-CM

## 2022-02-03 IMAGING — CR DG LUMBAR SPINE COMPLETE 4+V
5 series · 5 of 5 positions shown · non-contrast
Comparison: None.

CLINICAL DATA: Low back pain

EXAM:
LUMBAR SPINE - COMPLETE 4+ VIEW

[t lumbar spine ap]
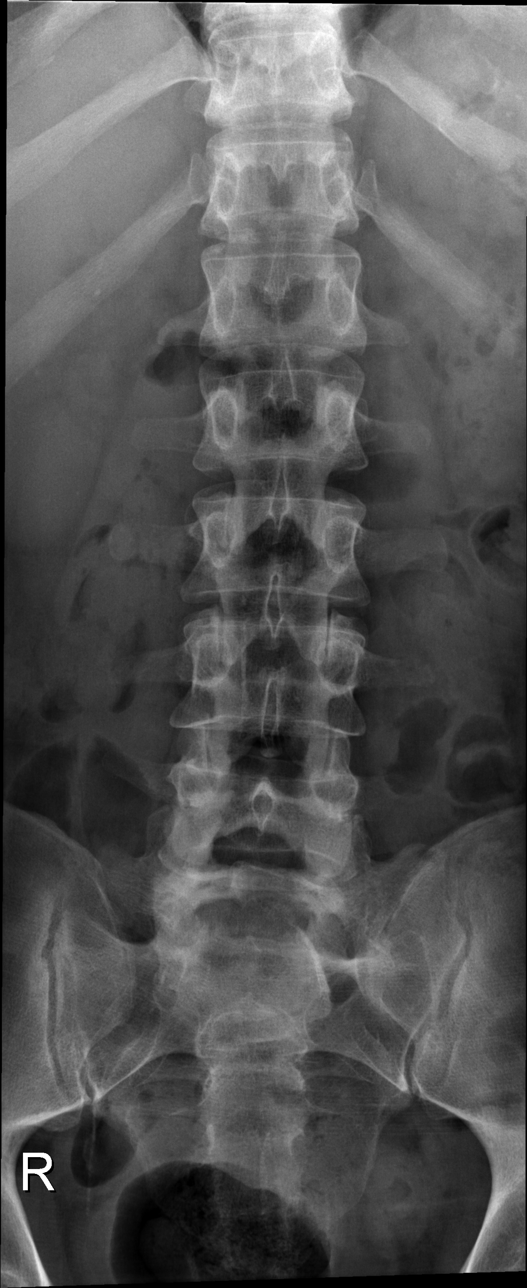

[t lumbar spine obl (1 of 2)]
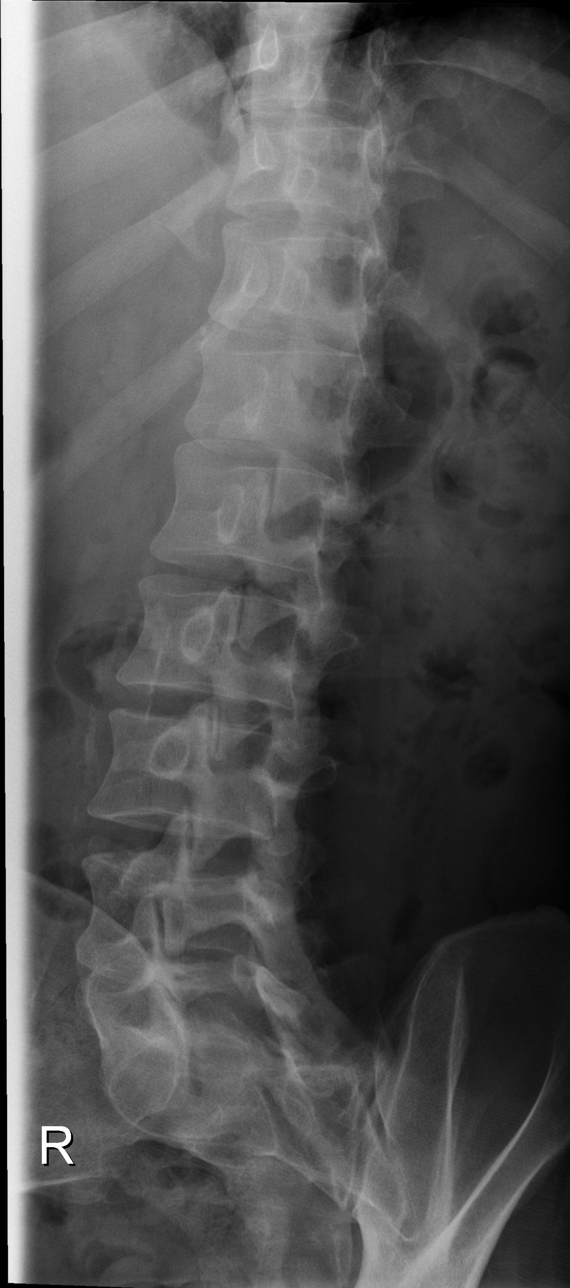

[t lumbar spine obl (2 of 2)]
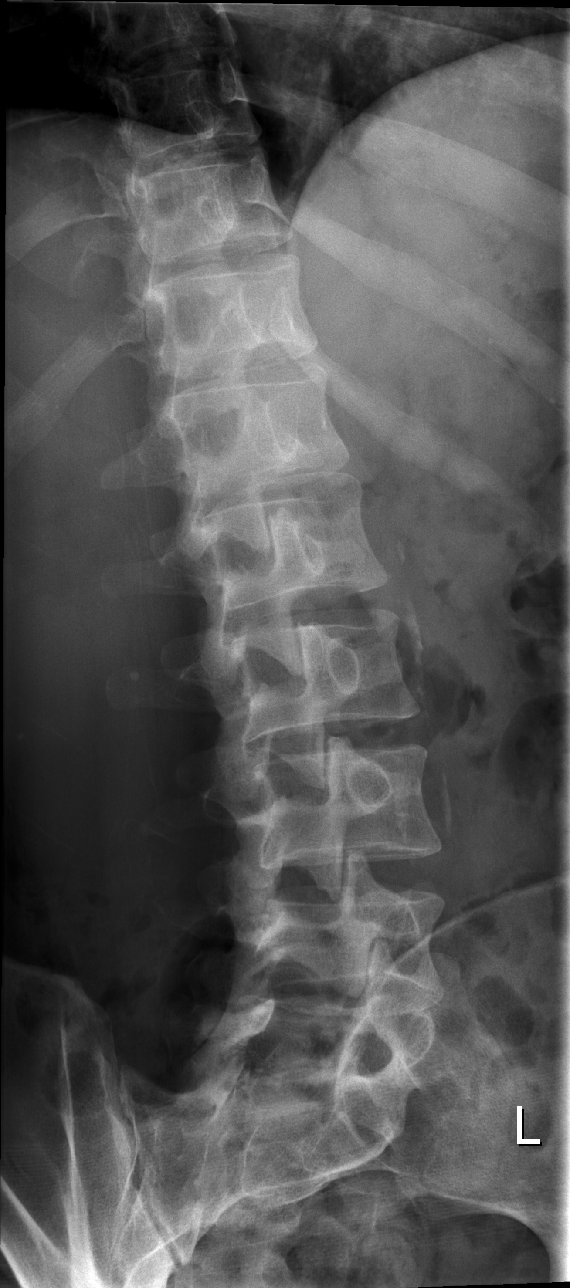

[t lumbar spine lat]
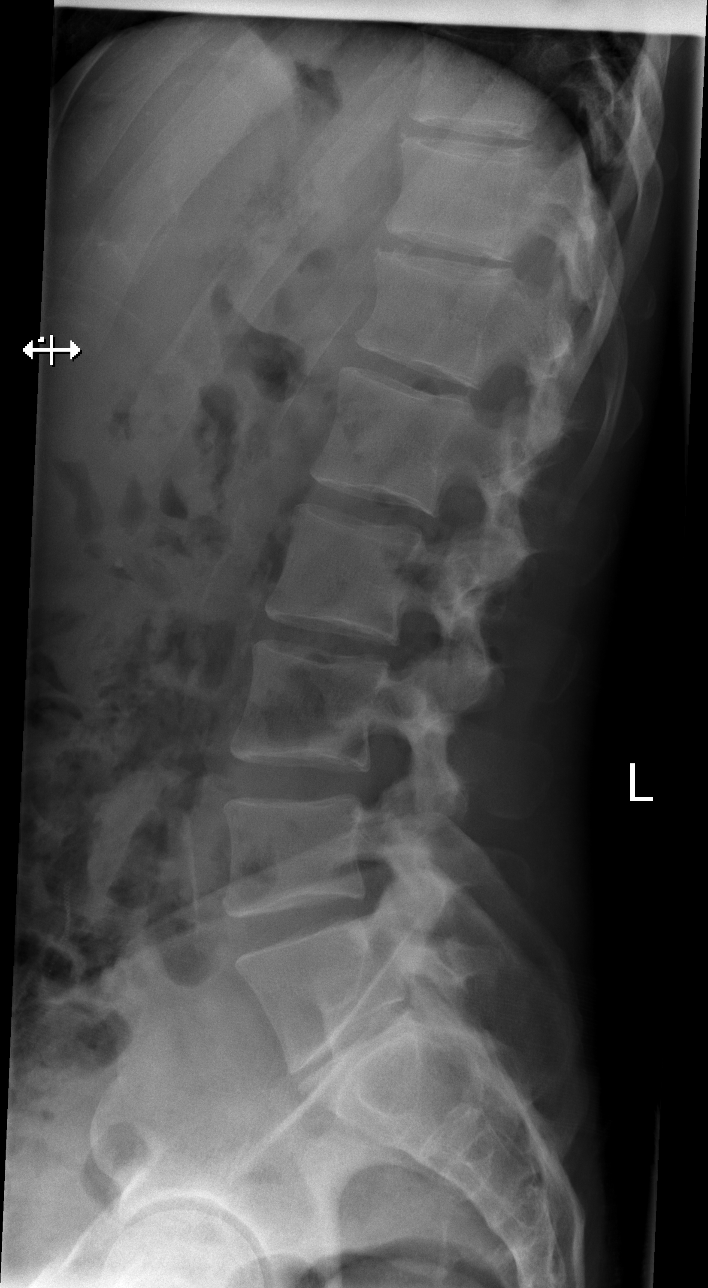

[t lumbar l-5 s-1 spot]
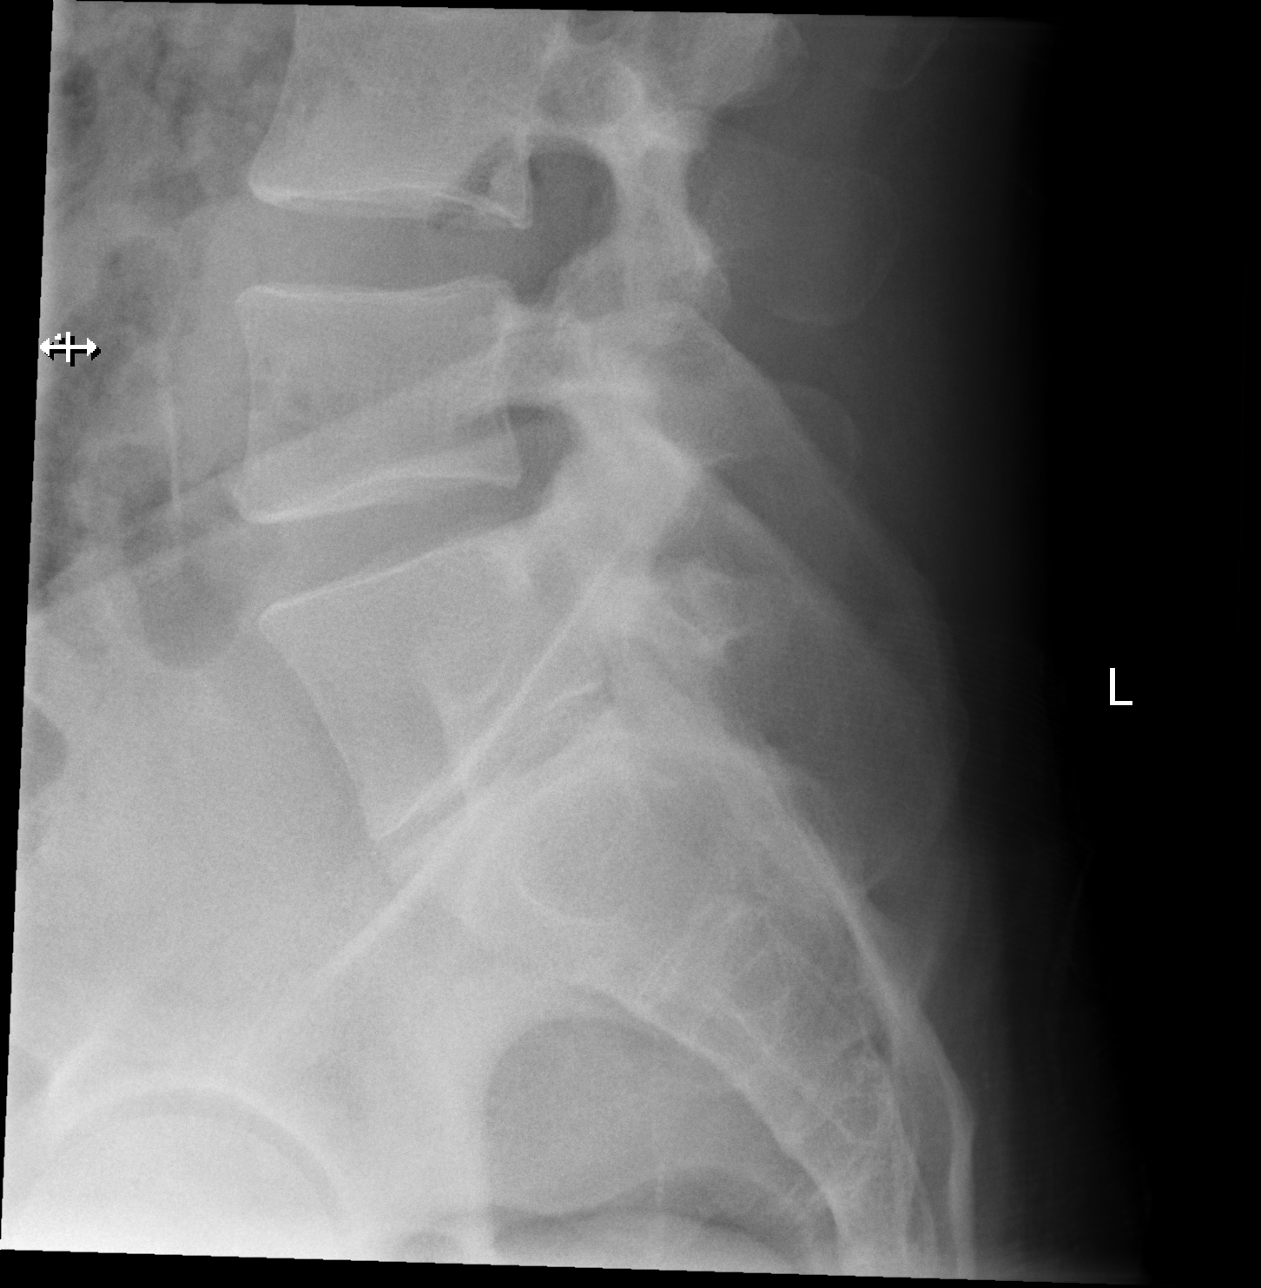

[5 of 5 positions shown; findings below may reference images not displayed]

FINDINGS: There are 5 non rib-bearing lumbar type vertebral bodies. The
vertebral body heights and disc spaces are well preserved. No
fracture or dislocation. Aortic atherosclerosis.
IMPRESSION: 1. No acute findings.

## 2022-12-18 DIAGNOSIS — Z800 Family history of malignant neoplasm of digestive organs: Secondary | ICD-10-CM | POA: Diagnosis not present

## 2022-12-18 DIAGNOSIS — K37 Unspecified appendicitis: Secondary | ICD-10-CM | POA: Diagnosis not present

## 2022-12-18 DIAGNOSIS — R1031 Right lower quadrant pain: Secondary | ICD-10-CM | POA: Diagnosis not present

## 2022-12-22 DIAGNOSIS — Z4682 Encounter for fitting and adjustment of non-vascular catheter: Secondary | ICD-10-CM | POA: Diagnosis not present

## 2023-01-01 DIAGNOSIS — F17210 Nicotine dependence, cigarettes, uncomplicated: Secondary | ICD-10-CM | POA: Diagnosis not present

## 2023-01-01 DIAGNOSIS — K635 Polyp of colon: Secondary | ICD-10-CM | POA: Diagnosis not present

## 2023-01-01 DIAGNOSIS — K3532 Acute appendicitis with perforation and localized peritonitis, without abscess: Secondary | ICD-10-CM | POA: Diagnosis not present

## 2023-01-01 DIAGNOSIS — Z1211 Encounter for screening for malignant neoplasm of colon: Secondary | ICD-10-CM | POA: Diagnosis not present

## 2023-01-01 DIAGNOSIS — Z79899 Other long term (current) drug therapy: Secondary | ICD-10-CM | POA: Diagnosis not present

## 2023-01-01 DIAGNOSIS — K621 Rectal polyp: Secondary | ICD-10-CM | POA: Diagnosis not present

## 2023-01-05 DIAGNOSIS — R1084 Generalized abdominal pain: Secondary | ICD-10-CM | POA: Diagnosis not present

## 2023-02-10 DIAGNOSIS — I471 Supraventricular tachycardia, unspecified: Secondary | ICD-10-CM | POA: Diagnosis not present
